# Patient Record
Sex: Female | Born: 1985
Health system: Southern US, Community
[De-identification: ages and names within clinical notes are randomized; demographics above are authoritative.]

## PROBLEM LIST (undated history)

## (undated) DIAGNOSIS — R51 Headache: Secondary | ICD-10-CM

## (undated) DIAGNOSIS — F329 Major depressive disorder, single episode, unspecified: Secondary | ICD-10-CM

## (undated) DIAGNOSIS — I259 Chronic ischemic heart disease, unspecified: Secondary | ICD-10-CM

## (undated) DIAGNOSIS — R519 Headache, unspecified: Secondary | ICD-10-CM

## (undated) DIAGNOSIS — S069XAA Unspecified intracranial injury with loss of consciousness status unknown, initial encounter: Secondary | ICD-10-CM

## (undated) DIAGNOSIS — N186 End stage renal disease: Secondary | ICD-10-CM

## (undated) DIAGNOSIS — E611 Iron deficiency: Secondary | ICD-10-CM

## (undated) DIAGNOSIS — F419 Anxiety disorder, unspecified: Secondary | ICD-10-CM

## (undated) DIAGNOSIS — F32A Depression, unspecified: Secondary | ICD-10-CM

## (undated) DIAGNOSIS — N2581 Secondary hyperparathyroidism of renal origin: Secondary | ICD-10-CM

## (undated) DIAGNOSIS — I10 Essential (primary) hypertension: Secondary | ICD-10-CM

## (undated) DIAGNOSIS — S069X9A Unspecified intracranial injury with loss of consciousness of unspecified duration, initial encounter: Secondary | ICD-10-CM

## (undated) HISTORY — DX: Secondary hyperparathyroidism of renal origin: N25.81

## (undated) HISTORY — PX: ARTERIOVENOUS GRAFT PLACEMENT: SUR1029

## (undated) HISTORY — DX: End stage renal disease: N18.6

## (undated) HISTORY — DX: Iron deficiency: E61.1

## (undated) HISTORY — PX: NO PAST SURGERIES: SHX2092

## (undated) HISTORY — DX: Unspecified intracranial injury with loss of consciousness of unspecified duration, initial encounter: S06.9X9A

## (undated) HISTORY — DX: Unspecified intracranial injury with loss of consciousness status unknown, initial encounter: S06.9XAA

## (undated) HISTORY — DX: Anxiety disorder, unspecified: F41.9

## (undated) HISTORY — PX: OTHER SURGICAL HISTORY: SHX169

## (undated) HISTORY — DX: Chronic ischemic heart disease, unspecified: I25.9

---

## 2006-11-14 ENCOUNTER — Inpatient Hospital Stay (HOSPITAL_COMMUNITY): Admission: EM | Admit: 2006-11-14 | Discharge: 2006-11-23 | Payer: Self-pay | Admitting: Emergency Medicine

## 2006-11-14 DIAGNOSIS — I509 Heart failure, unspecified: Secondary | ICD-10-CM

## 2006-11-14 HISTORY — DX: Heart failure, unspecified: I50.9

## 2006-11-16 ENCOUNTER — Ambulatory Visit: Payer: Self-pay | Admitting: Vascular Surgery

## 2006-11-19 ENCOUNTER — Encounter: Payer: Self-pay | Admitting: Vascular Surgery

## 2007-01-02 ENCOUNTER — Ambulatory Visit: Payer: Self-pay | Admitting: Vascular Surgery

## 2007-02-08 ENCOUNTER — Ambulatory Visit (HOSPITAL_COMMUNITY): Admission: RE | Admit: 2007-02-08 | Discharge: 2007-02-08 | Payer: Self-pay | Admitting: Vascular Surgery

## 2007-02-08 ENCOUNTER — Ambulatory Visit: Payer: Self-pay | Admitting: Vascular Surgery

## 2007-08-11 ENCOUNTER — Emergency Department (HOSPITAL_COMMUNITY): Admission: EM | Admit: 2007-08-11 | Discharge: 2007-08-12 | Payer: Self-pay | Admitting: Emergency Medicine

## 2007-08-13 ENCOUNTER — Inpatient Hospital Stay (HOSPITAL_COMMUNITY): Admission: EM | Admit: 2007-08-13 | Discharge: 2007-08-16 | Payer: Self-pay | Admitting: Emergency Medicine

## 2007-10-22 ENCOUNTER — Emergency Department (HOSPITAL_COMMUNITY): Admission: EM | Admit: 2007-10-22 | Discharge: 2007-10-22 | Payer: Self-pay | Admitting: Emergency Medicine

## 2008-03-19 ENCOUNTER — Encounter: Admission: RE | Admit: 2008-03-19 | Discharge: 2008-03-19 | Payer: Self-pay | Admitting: Nephrology

## 2009-07-28 ENCOUNTER — Inpatient Hospital Stay (HOSPITAL_COMMUNITY): Admission: AD | Admit: 2009-07-28 | Discharge: 2009-07-28 | Payer: Self-pay | Admitting: Obstetrics & Gynecology

## 2009-10-15 ENCOUNTER — Ambulatory Visit: Payer: Self-pay | Admitting: Vascular Surgery

## 2009-10-21 ENCOUNTER — Inpatient Hospital Stay (HOSPITAL_COMMUNITY): Admission: RE | Admit: 2009-10-21 | Discharge: 2009-10-23 | Payer: Self-pay | Admitting: Vascular Surgery

## 2009-10-21 ENCOUNTER — Ambulatory Visit: Payer: Self-pay | Admitting: Vascular Surgery

## 2009-10-28 ENCOUNTER — Ambulatory Visit: Payer: Self-pay | Admitting: Thoracic Diseases

## 2009-11-19 ENCOUNTER — Ambulatory Visit: Payer: Self-pay | Admitting: Vascular Surgery

## 2009-12-15 ENCOUNTER — Emergency Department (HOSPITAL_COMMUNITY): Admission: EM | Admit: 2009-12-15 | Discharge: 2009-12-15 | Payer: Self-pay | Admitting: Emergency Medicine

## 2009-12-24 ENCOUNTER — Ambulatory Visit: Payer: Self-pay | Admitting: Vascular Surgery

## 2010-01-04 ENCOUNTER — Ambulatory Visit: Payer: Self-pay | Admitting: Vascular Surgery

## 2010-01-04 ENCOUNTER — Ambulatory Visit (HOSPITAL_COMMUNITY): Admission: RE | Admit: 2010-01-04 | Discharge: 2010-01-04 | Payer: Self-pay | Admitting: Vascular Surgery

## 2010-01-13 ENCOUNTER — Ambulatory Visit: Payer: Self-pay | Admitting: Vascular Surgery

## 2010-01-16 ENCOUNTER — Ambulatory Visit: Payer: Self-pay | Admitting: Pulmonary Disease

## 2010-01-16 ENCOUNTER — Inpatient Hospital Stay (HOSPITAL_COMMUNITY): Admission: EM | Admit: 2010-01-16 | Discharge: 2010-01-17 | Payer: Self-pay | Admitting: Emergency Medicine

## 2010-01-20 ENCOUNTER — Ambulatory Visit (HOSPITAL_COMMUNITY): Admission: RE | Admit: 2010-01-20 | Discharge: 2010-01-20 | Payer: Self-pay | Admitting: Vascular Surgery

## 2010-01-20 ENCOUNTER — Ambulatory Visit: Payer: Self-pay | Admitting: Vascular Surgery

## 2010-02-04 ENCOUNTER — Encounter: Payer: Self-pay | Admitting: Emergency Medicine

## 2010-02-04 ENCOUNTER — Inpatient Hospital Stay (HOSPITAL_COMMUNITY): Admission: EM | Admit: 2010-02-04 | Discharge: 2010-02-11 | Payer: Self-pay | Admitting: Vascular Surgery

## 2010-02-21 ENCOUNTER — Ambulatory Visit: Payer: Self-pay | Admitting: Surgery

## 2010-03-07 ENCOUNTER — Ambulatory Visit: Payer: Self-pay | Admitting: Surgery

## 2010-03-28 ENCOUNTER — Ambulatory Visit: Payer: Self-pay | Admitting: Surgery

## 2010-04-15 ENCOUNTER — Ambulatory Visit: Payer: Self-pay | Admitting: Vascular Surgery

## 2010-04-25 ENCOUNTER — Ambulatory Visit: Payer: Self-pay | Admitting: Surgery

## 2010-05-27 ENCOUNTER — Inpatient Hospital Stay (HOSPITAL_COMMUNITY)
Admission: EM | Admit: 2010-05-27 | Discharge: 2010-06-02 | Payer: Self-pay | Source: Home / Self Care | Admitting: Emergency Medicine

## 2010-05-29 ENCOUNTER — Ambulatory Visit: Payer: Self-pay | Admitting: Vascular Surgery

## 2010-05-30 ENCOUNTER — Ambulatory Visit: Payer: Self-pay | Admitting: Infectious Diseases

## 2010-06-02 ENCOUNTER — Encounter (INDEPENDENT_AMBULATORY_CARE_PROVIDER_SITE_OTHER): Payer: Self-pay | Admitting: Cardiovascular Disease

## 2010-06-15 ENCOUNTER — Ambulatory Visit: Payer: Self-pay | Admitting: Vascular Surgery

## 2010-07-15 ENCOUNTER — Inpatient Hospital Stay (HOSPITAL_COMMUNITY): Admission: RE | Admit: 2010-07-15 | Discharge: 2010-07-17 | Payer: Self-pay | Admitting: Vascular Surgery

## 2010-07-27 ENCOUNTER — Ambulatory Visit: Payer: Self-pay | Admitting: Vascular Surgery

## 2010-08-17 ENCOUNTER — Emergency Department (HOSPITAL_COMMUNITY)
Admission: EM | Admit: 2010-08-17 | Discharge: 2010-08-17 | Payer: Medicaid Other | Source: Home / Self Care | Admitting: Emergency Medicine

## 2010-08-18 ENCOUNTER — Ambulatory Visit: Admission: RE | Admit: 2010-08-18 | Discharge: 2010-08-18 | Payer: Self-pay | Source: Home / Self Care

## 2010-08-23 ENCOUNTER — Ambulatory Visit (HOSPITAL_COMMUNITY)
Admission: RE | Admit: 2010-08-23 | Discharge: 2010-08-23 | Payer: Self-pay | Source: Home / Self Care | Attending: Vascular Surgery | Admitting: Vascular Surgery

## 2010-08-29 LAB — POCT I-STAT 4, (NA,K, GLUC, HGB,HCT)
Glucose, Bld: 86 mg/dL (ref 70–99)
HCT: 46 % (ref 36.0–46.0)
Hemoglobin: 15.6 g/dL — ABNORMAL HIGH (ref 12.0–15.0)
Potassium: 5.1 mEq/L (ref 3.5–5.1)
Sodium: 138 mEq/L (ref 135–145)

## 2010-08-29 LAB — SURGICAL PCR SCREEN
MRSA, PCR: NEGATIVE
Staphylococcus aureus: NEGATIVE

## 2010-08-29 LAB — HCG, SERUM, QUALITATIVE: Preg, Serum: NEGATIVE

## 2010-09-03 ENCOUNTER — Encounter: Payer: Self-pay | Admitting: Internal Medicine

## 2010-10-25 LAB — CULTURE, BLOOD (ROUTINE X 2)
Culture  Setup Time: 201112022351
Culture: NO GROWTH

## 2010-10-25 LAB — POCT I-STAT 4, (NA,K, GLUC, HGB,HCT)
HCT: 42 % (ref 36.0–46.0)
Sodium: 134 mEq/L — ABNORMAL LOW (ref 135–145)

## 2010-10-25 LAB — RENAL FUNCTION PANEL
Albumin: 3.2 g/dL — ABNORMAL LOW (ref 3.5–5.2)
Chloride: 98 mEq/L (ref 96–112)
Creatinine, Ser: 9.51 mg/dL — ABNORMAL HIGH (ref 0.4–1.2)
GFR calc non Af Amer: 5 mL/min — ABNORMAL LOW (ref >60)
Potassium: 5.1 mEq/L (ref 3.5–5.1)
Sodium: 133 mEq/L — ABNORMAL LOW (ref 135–145)

## 2010-10-25 LAB — BASIC METABOLIC PANEL
BUN: 37 mg/dL — ABNORMAL HIGH (ref 6–23)
BUN: 73 mg/dL — ABNORMAL HIGH (ref 6–23)
Calcium: 9 mg/dL (ref 8.4–10.5)
Chloride: 97 mEq/L (ref 96–112)
GFR calc non Af Amer: 5 mL/min — ABNORMAL LOW (ref >60)
GFR calc non Af Amer: 7 mL/min — ABNORMAL LOW (ref >60)
Potassium: 4.6 mEq/L (ref 3.5–5.1)
Sodium: 130 mEq/L — ABNORMAL LOW (ref 135–145)

## 2010-10-25 LAB — CBC
Hemoglobin: 12.2 g/dL (ref 12.0–15.0)
Hemoglobin: 13.3 g/dL (ref 12.0–15.0)
MCHC: 32.4 g/dL (ref 30.0–36.0)
Platelets: 141 10*3/uL — ABNORMAL LOW (ref 150–400)
Platelets: 142 10*3/uL — ABNORMAL LOW (ref 150–400)
RBC: 3.82 MIL/uL — ABNORMAL LOW (ref 3.87–5.11)
RBC: 4.28 MIL/uL (ref 3.87–5.11)
RDW: 14 % (ref 11.5–15.5)
WBC: 12.6 10*3/uL — ABNORMAL HIGH (ref 4.0–10.5)
WBC: 17.4 10*3/uL — ABNORMAL HIGH (ref 4.0–10.5)
WBC: 17.6 10*3/uL — ABNORMAL HIGH (ref 4.0–10.5)

## 2010-10-25 LAB — PROTIME-INR: Prothrombin Time: 15 seconds (ref 11.6–15.2)

## 2010-10-25 LAB — APTT: aPTT: 36 seconds (ref 24–37)

## 2010-10-25 LAB — MRSA PCR SCREENING

## 2010-10-26 LAB — ANAEROBIC CULTURE

## 2010-10-26 LAB — CBC
HCT: 35.6 % — ABNORMAL LOW (ref 36.0–46.0)
Hemoglobin: 11.4 g/dL — ABNORMAL LOW (ref 12.0–15.0)
MCHC: 32.5 g/dL (ref 30.0–36.0)
MCHC: 32.5 g/dL (ref 30.0–36.0)
MCV: 90.1 fL (ref 78.0–100.0)
Platelets: 144 10*3/uL — ABNORMAL LOW (ref 150–400)
Platelets: 199 10*3/uL (ref 150–400)
RDW: 13.9 % (ref 11.5–15.5)
RDW: 14.1 % (ref 11.5–15.5)
WBC: 13.8 10*3/uL — ABNORMAL HIGH (ref 4.0–10.5)
WBC: 9.4 10*3/uL (ref 4.0–10.5)

## 2010-10-26 LAB — PROTEIN C ACTIVITY: Protein C Activity: 76 % (ref 75–133)

## 2010-10-26 LAB — RENAL FUNCTION PANEL
Albumin: 2.4 g/dL — ABNORMAL LOW (ref 3.5–5.2)
BUN: 37 mg/dL — ABNORMAL HIGH (ref 6–23)
Calcium: 8.4 mg/dL (ref 8.4–10.5)
Calcium: 9 mg/dL (ref 8.4–10.5)
GFR calc Af Amer: 5 mL/min — ABNORMAL LOW (ref >60)
GFR calc non Af Amer: 4 mL/min — ABNORMAL LOW (ref >60)
Phosphorus: 7.7 mg/dL — ABNORMAL HIGH (ref 2.3–4.6)
Phosphorus: 8.7 mg/dL — ABNORMAL HIGH (ref 2.3–4.6)
Potassium: 4.8 mEq/L (ref 3.5–5.1)
Sodium: 130 mEq/L — ABNORMAL LOW (ref 135–145)
Sodium: 133 mEq/L — ABNORMAL LOW (ref 135–145)

## 2010-10-26 LAB — PROTIME-INR: INR: 1.08 (ref 0.00–1.49)

## 2010-10-26 LAB — CULTURE, BLOOD (ROUTINE X 2)
Culture  Setup Time: 201110172336
Culture: NO GROWTH

## 2010-10-26 LAB — LUPUS ANTICOAGULANT PANEL
DRVVT: 63.1 secs — ABNORMAL HIGH (ref 36.2–44.3)
Lupus Anticoagulant: DETECTED — AB
PTT Lupus Anticoagulant: 51 secs — ABNORMAL HIGH (ref 30.0–45.6)
PTTLA 4:1 Mix: 49.6 secs — ABNORMAL HIGH (ref 30.0–45.6)

## 2010-10-26 LAB — INFLUENZA PANEL BY PCR (TYPE A & B)
Influenza A By PCR: NEGATIVE
Influenza B By PCR: NEGATIVE

## 2010-10-26 LAB — HOMOCYSTEINE: Homocysteine: 25 umol/L — ABNORMAL HIGH (ref 4.0–15.4)

## 2010-10-26 LAB — APTT: aPTT: 39 seconds — ABNORMAL HIGH (ref 24–37)

## 2010-10-26 LAB — HIV ANTIBODY (ROUTINE TESTING W REFLEX): HIV: NONREACTIVE

## 2010-10-26 LAB — PROTHROMBIN GENE MUTATION

## 2010-10-26 LAB — BETA-2-GLYCOPROTEIN I ABS, IGG/M/A: Beta-2-Glycoprotein I IgA: 6 A Units (ref <20)

## 2010-10-26 LAB — TISSUE CULTURE

## 2010-10-27 LAB — BASIC METABOLIC PANEL WITH GFR
BUN: 15 mg/dL (ref 6–23)
CO2: 25 meq/L (ref 19–32)
Calcium: 8.9 mg/dL (ref 8.4–10.5)
Chloride: 99 meq/L (ref 96–112)
Creatinine, Ser: 4.38 mg/dL — ABNORMAL HIGH (ref 0.4–1.2)
GFR calc non Af Amer: 12 mL/min — ABNORMAL LOW
Glucose, Bld: 93 mg/dL (ref 70–99)
Potassium: 4.3 meq/L (ref 3.5–5.1)
Sodium: 137 meq/L (ref 135–145)

## 2010-10-27 LAB — URINALYSIS, ROUTINE W REFLEX MICROSCOPIC
Bilirubin Urine: NEGATIVE
Glucose, UA: 100 mg/dL — AB
Ketones, ur: NEGATIVE mg/dL
Leukocytes, UA: NEGATIVE
Nitrite: NEGATIVE
Protein, ur: >300 mg/dL — AB
Specific Gravity, Urine: 1.014 (ref 1.005–1.030)
Urobilinogen, UA: 0.2 mg/dL (ref 0.0–1.0)
pH: 8 (ref 5.0–8.0)

## 2010-10-27 LAB — DIFFERENTIAL
Basophils Absolute: 0.1 10*3/uL (ref 0.0–0.1)
Basophils Relative: 0 % (ref 0–1)
Eosinophils Absolute: 0 10*3/uL (ref 0.0–0.7)
Neutro Abs: 12 10*3/uL — ABNORMAL HIGH (ref 1.7–7.7)
Neutrophils Relative %: 89 % — ABNORMAL HIGH (ref 43–77)

## 2010-10-27 LAB — URINE MICROSCOPIC-ADD ON

## 2010-10-27 LAB — PROLACTIN: Prolactin: 28.8 ng/mL

## 2010-10-27 LAB — HEPATIC FUNCTION PANEL
ALT: 35 U/L (ref 0–35)
AST: 30 U/L (ref 0–37)
Albumin: 3.1 g/dL — ABNORMAL LOW (ref 3.5–5.2)
Bilirubin, Direct: <0.1 mg/dL (ref 0.0–0.3)
Total Bilirubin: 0.5 mg/dL (ref 0.3–1.2)

## 2010-10-27 LAB — CBC
HCT: 39.1 % (ref 36.0–46.0)
Hemoglobin: 13.2 g/dL (ref 12.0–15.0)
MCH: 29.9 pg (ref 26.0–34.0)
MCHC: 33.8 g/dL (ref 30.0–36.0)
MCV: 88.7 fL (ref 78.0–100.0)
Platelets: 155 10*3/uL (ref 150–400)
RBC: 4.41 MIL/uL (ref 3.87–5.11)
RDW: 14.1 % (ref 11.5–15.5)
WBC: 13.4 10*3/uL — ABNORMAL HIGH (ref 4.0–10.5)

## 2010-10-27 LAB — MRSA PCR SCREENING: MRSA by PCR: NEGATIVE

## 2010-10-27 LAB — CULTURE, BLOOD (ROUTINE X 2): Culture  Setup Time: 201110150113

## 2010-10-27 LAB — PROCALCITONIN: Procalcitonin: 17.09 ng/mL

## 2010-10-27 LAB — POCT PREGNANCY, URINE: Preg Test, Ur: NEGATIVE

## 2010-10-27 LAB — LACTIC ACID, PLASMA: Lactic Acid, Venous: 0.9 mmol/L (ref 0.5–2.2)

## 2010-10-30 LAB — RENAL FUNCTION PANEL
Albumin: 2.4 g/dL — ABNORMAL LOW (ref 3.5–5.2)
Albumin: 2.4 g/dL — ABNORMAL LOW (ref 3.5–5.2)
BUN: 29 mg/dL — ABNORMAL HIGH (ref 6–23)
Calcium: 9.1 mg/dL (ref 8.4–10.5)
Calcium: 11.2 mg/dL — ABNORMAL HIGH (ref 8.4–10.5)
Calcium: 8.9 mg/dL (ref 8.4–10.5)
Creatinine, Ser: 10.84 mg/dL — ABNORMAL HIGH (ref 0.4–1.2)
GFR calc Af Amer: 5 mL/min — ABNORMAL LOW (ref >60)
GFR calc Af Amer: 5 mL/min — ABNORMAL LOW (ref >60)
GFR calc non Af Amer: 4 mL/min — ABNORMAL LOW (ref >60)
GFR calc non Af Amer: 4 mL/min — ABNORMAL LOW (ref >60)
Glucose, Bld: 91 mg/dL (ref 70–99)
Glucose, Bld: 143 mg/dL — ABNORMAL HIGH (ref 70–99)
Phosphorus: 3.8 mg/dL (ref 2.3–4.6)
Phosphorus: 5 mg/dL — ABNORMAL HIGH (ref 2.3–4.6)
Potassium: 5.3 mEq/L — ABNORMAL HIGH (ref 3.5–5.1)
Sodium: 136 mEq/L (ref 135–145)
Sodium: 134 mEq/L — ABNORMAL LOW (ref 135–145)

## 2010-10-30 LAB — URINALYSIS, ROUTINE W REFLEX MICROSCOPIC
Bilirubin Urine: NEGATIVE
Glucose, UA: 250 mg/dL — AB
Ketones, ur: NEGATIVE mg/dL
Specific Gravity, Urine: 1.011 (ref 1.005–1.030)
pH: 8.5 — ABNORMAL HIGH (ref 5.0–8.0)

## 2010-10-30 LAB — COMPREHENSIVE METABOLIC PANEL
ALT: 20 U/L (ref 0–35)
Alkaline Phosphatase: 74 U/L (ref 39–117)
BUN: 20 mg/dL (ref 6–23)
CO2: 26 mEq/L (ref 19–32)
GFR calc non Af Amer: 9 mL/min — ABNORMAL LOW (ref >60)
Glucose, Bld: 94 mg/dL (ref 70–99)
Potassium: 3.4 mEq/L — ABNORMAL LOW (ref 3.5–5.1)
Sodium: 135 mEq/L (ref 135–145)
Total Bilirubin: 0.2 mg/dL — ABNORMAL LOW (ref 0.3–1.2)

## 2010-10-30 LAB — IRON AND TIBC
Iron: 59 ug/dL (ref 42–135)
TIBC: 179 ug/dL — ABNORMAL LOW (ref 250–470)

## 2010-10-30 LAB — BASIC METABOLIC PANEL
BUN: 47 mg/dL — ABNORMAL HIGH (ref 6–23)
CO2: 24 mEq/L (ref 19–32)
Calcium: 9.3 mg/dL (ref 8.4–10.5)
Creatinine, Ser: 9.59 mg/dL — ABNORMAL HIGH (ref 0.4–1.2)
GFR calc Af Amer: 6 mL/min — ABNORMAL LOW (ref >60)

## 2010-10-30 LAB — URINE MICROSCOPIC-ADD ON

## 2010-10-30 LAB — URINE CULTURE

## 2010-10-30 LAB — CBC
HCT: 29.4 % — ABNORMAL LOW (ref 36.0–46.0)
HCT: 30.4 % — ABNORMAL LOW (ref 36.0–46.0)
HCT: 32.3 % — ABNORMAL LOW (ref 36.0–46.0)
HCT: 35.4 % — ABNORMAL LOW (ref 36.0–46.0)
Hemoglobin: 11.6 g/dL — ABNORMAL LOW (ref 12.0–15.0)
Hemoglobin: 9.6 g/dL — ABNORMAL LOW (ref 12.0–15.0)
MCH: 30.2 pg (ref 26.0–34.0)
MCH: 30.2 pg (ref 26.0–34.0)
MCH: 30.4 pg (ref 26.0–34.0)
MCHC: 33.7 g/dL (ref 30.0–36.0)
MCHC: 33.1 g/dL (ref 30.0–36.0)
MCHC: 33.3 g/dL (ref 30.0–36.0)
MCHC: 33.8 g/dL (ref 30.0–36.0)
MCV: 89.6 fL (ref 78.0–100.0)
MCV: 91 fL (ref 78.0–100.0)
Platelets: 109 10*3/uL — ABNORMAL LOW (ref 150–400)
Platelets: 110 10*3/uL — ABNORMAL LOW (ref 150–400)
Platelets: 139 10*3/uL — ABNORMAL LOW (ref 150–400)
Platelets: 163 10*3/uL (ref 150–400)
RBC: 3.27 MIL/uL — ABNORMAL LOW (ref 3.87–5.11)
RDW: 14.7 % (ref 11.5–15.5)
RDW: 14.9 % (ref 11.5–15.5)
RDW: 15.2 % (ref 11.5–15.5)
RDW: 15.4 % (ref 11.5–15.5)
RDW: 15.7 % — ABNORMAL HIGH (ref 11.5–15.5)
WBC: 13.1 10*3/uL — ABNORMAL HIGH (ref 4.0–10.5)
WBC: 22 10*3/uL — ABNORMAL HIGH (ref 4.0–10.5)

## 2010-10-30 LAB — DIFFERENTIAL
Basophils Absolute: 0 10*3/uL (ref 0.0–0.1)
Basophils Relative: 0 % (ref 0–1)
Eosinophils Absolute: 0 10*3/uL (ref 0.0–0.7)
Neutro Abs: 19.5 10*3/uL — ABNORMAL HIGH (ref 1.7–7.7)
Neutrophils Relative %: 89 % — ABNORMAL HIGH (ref 43–77)

## 2010-10-30 LAB — CULTURE, BLOOD (ROUTINE X 2)

## 2010-10-30 LAB — POCT I-STAT, CHEM 8
BUN: 21 mg/dL (ref 6–23)
Calcium, Ion: 0.86 mmol/L — ABNORMAL LOW (ref 1.12–1.32)
Chloride: 101 mEq/L (ref 96–112)
Glucose, Bld: 88 mg/dL (ref 70–99)
HCT: 39 % (ref 36.0–46.0)
Potassium: 3.8 mEq/L (ref 3.5–5.1)

## 2010-10-30 LAB — PROCALCITONIN: Procalcitonin: 6.95 ng/mL

## 2010-10-30 LAB — POCT PREGNANCY, URINE: Preg Test, Ur: NEGATIVE

## 2010-10-30 LAB — LACTIC ACID, PLASMA: Lactic Acid, Venous: 1 mmol/L (ref 0.5–2.2)

## 2010-10-31 LAB — TRICYCLICS SCREEN, URINE: TCA Scrn: NOT DETECTED

## 2010-10-31 LAB — BASIC METABOLIC PANEL
BUN: 61 mg/dL — ABNORMAL HIGH (ref 6–23)
CO2: 20 mEq/L (ref 19–32)
Chloride: 101 mEq/L (ref 96–112)
GFR calc Af Amer: 8 mL/min — ABNORMAL LOW (ref >60)
GFR calc non Af Amer: 6 mL/min — ABNORMAL LOW (ref >60)
Glucose, Bld: 108 mg/dL — ABNORMAL HIGH (ref 70–99)
Glucose, Bld: 87 mg/dL (ref 70–99)
Potassium: 3.6 mEq/L (ref 3.5–5.1)
Potassium: 4.7 mEq/L (ref 3.5–5.1)
Sodium: 133 mEq/L — ABNORMAL LOW (ref 135–145)
Sodium: 137 mEq/L (ref 135–145)

## 2010-10-31 LAB — CBC
HCT: 36.1 % (ref 36.0–46.0)
HCT: 36.5 % (ref 36.0–46.0)
Hemoglobin: 11.9 g/dL — ABNORMAL LOW (ref 12.0–15.0)
Hemoglobin: 12.2 g/dL (ref 12.0–15.0)
Hemoglobin: 12.4 g/dL (ref 12.0–15.0)
MCHC: 34 g/dL (ref 30.0–36.0)
MCV: 92.6 fL (ref 78.0–100.0)
Platelets: 180 10*3/uL (ref 150–400)
RBC: 3.87 MIL/uL (ref 3.87–5.11)
RBC: 3.93 MIL/uL (ref 3.87–5.11)
RDW: 15.8 % — ABNORMAL HIGH (ref 11.5–15.5)
RDW: 16 % — ABNORMAL HIGH (ref 11.5–15.5)

## 2010-10-31 LAB — POCT I-STAT 4, (NA,K, GLUC, HGB,HCT)
Glucose, Bld: 88 mg/dL (ref 70–99)
Glucose, Bld: 91 mg/dL (ref 70–99)
HCT: 44 % (ref 36.0–46.0)
Hemoglobin: 16 g/dL — ABNORMAL HIGH (ref 12.0–15.0)
Potassium: 4.4 mEq/L (ref 3.5–5.1)
Sodium: 140 mEq/L (ref 135–145)

## 2010-10-31 LAB — DIFFERENTIAL
Basophils Absolute: 0.1 10*3/uL (ref 0.0–0.1)
Eosinophils Relative: 1 % (ref 0–5)
Lymphocytes Relative: 10 % — ABNORMAL LOW (ref 12–46)
Lymphocytes Relative: 16 % (ref 12–46)
Lymphs Abs: 1.3 10*3/uL (ref 0.7–4.0)
Lymphs Abs: 2.3 10*3/uL (ref 0.7–4.0)
Monocytes Absolute: 0.7 10*3/uL (ref 0.1–1.0)
Monocytes Absolute: 0.9 10*3/uL (ref 0.1–1.0)
Monocytes Relative: 5 % (ref 3–12)
Monocytes Relative: 7 % (ref 3–12)
Neutro Abs: 10 10*3/uL — ABNORMAL HIGH (ref 1.7–7.7)

## 2010-10-31 LAB — POCT I-STAT 3, VENOUS BLOOD GAS (G3P V)
O2 Saturation: 99 %
TCO2: 21 mmol/L (ref 0–100)
pCO2, Ven: 32.9 mmHg — ABNORMAL LOW (ref 45.0–50.0)
pO2, Ven: 157 mmHg — ABNORMAL HIGH (ref 30.0–45.0)

## 2010-10-31 LAB — POCT I-STAT 3, ART BLOOD GAS (G3+)
Acid-base deficit: 3 mmol/L — ABNORMAL HIGH (ref 0.0–2.0)
Bicarbonate: 23 mEq/L (ref 20.0–24.0)
TCO2: 24 mmol/L (ref 0–100)
pH, Arterial: 7.318 — ABNORMAL LOW (ref 7.350–7.400)

## 2010-10-31 LAB — HEPATIC FUNCTION PANEL
ALT: 19 U/L (ref 0–35)
AST: 22 U/L (ref 0–37)
Alkaline Phosphatase: 86 U/L (ref 39–117)
Bilirubin, Direct: <0.1 mg/dL (ref 0.0–0.3)
Total Bilirubin: 0.8 mg/dL (ref 0.3–1.2)

## 2010-10-31 LAB — RAPID URINE DRUG SCREEN, HOSP PERFORMED
Barbiturates: NOT DETECTED
Cocaine: NOT DETECTED

## 2010-10-31 LAB — POCT I-STAT, CHEM 8
Chloride: 108 mEq/L (ref 96–112)
Creatinine, Ser: 7.9 mg/dL — ABNORMAL HIGH (ref 0.4–1.2)
Glucose, Bld: 87 mg/dL (ref 70–99)
Potassium: 7.2 mEq/L (ref 3.5–5.1)

## 2010-10-31 LAB — KETONES, QUALITATIVE: Acetone, Bld: NEGATIVE

## 2010-10-31 LAB — ETHANOL: Alcohol, Ethyl (B): 30 mg/dL — ABNORMAL HIGH (ref 0–10)

## 2010-11-01 LAB — COMPREHENSIVE METABOLIC PANEL
ALT: 49 U/L — ABNORMAL HIGH (ref 0–35)
Alkaline Phosphatase: 115 U/L (ref 39–117)
CO2: 34 mEq/L — ABNORMAL HIGH (ref 19–32)
Calcium: 9.8 mg/dL (ref 8.4–10.5)
GFR calc non Af Amer: 12 mL/min — ABNORMAL LOW (ref >60)
Glucose, Bld: 101 mg/dL — ABNORMAL HIGH (ref 70–99)
Sodium: 140 mEq/L (ref 135–145)

## 2010-11-01 LAB — DIFFERENTIAL
Basophils Absolute: 0 10*3/uL (ref 0.0–0.1)
Basophils Relative: 1 % (ref 0–1)
Eosinophils Absolute: 0.3 10*3/uL (ref 0.0–0.7)
Lymphs Abs: 1.3 10*3/uL (ref 0.7–4.0)
Neutrophils Relative %: 74 % (ref 43–77)

## 2010-11-01 LAB — URINALYSIS, ROUTINE W REFLEX MICROSCOPIC
Bilirubin Urine: NEGATIVE
Glucose, UA: 100 mg/dL — AB
Ketones, ur: NEGATIVE mg/dL
Leukocytes, UA: NEGATIVE
Protein, ur: 100 mg/dL — AB
pH: 8.5 — ABNORMAL HIGH (ref 5.0–8.0)

## 2010-11-01 LAB — CBC
Hemoglobin: 12.9 g/dL (ref 12.0–15.0)
MCHC: 33.6 g/dL (ref 30.0–36.0)
RBC: 4.11 MIL/uL (ref 3.87–5.11)

## 2010-11-01 LAB — POCT PREGNANCY, URINE: Preg Test, Ur: NEGATIVE

## 2010-11-01 LAB — URINE MICROSCOPIC-ADD ON

## 2010-11-01 LAB — LIPASE, BLOOD: Lipase: 19 U/L (ref 11–59)

## 2010-11-06 LAB — CBC
HCT: 35 % — ABNORMAL LOW (ref 36.0–46.0)
Hemoglobin: 11.8 g/dL — ABNORMAL LOW (ref 12.0–15.0)
MCV: 95.2 fL (ref 78.0–100.0)
RBC: 3.68 MIL/uL — ABNORMAL LOW (ref 3.87–5.11)
WBC: 13.9 10*3/uL — ABNORMAL HIGH (ref 4.0–10.5)

## 2010-11-06 LAB — COMPREHENSIVE METABOLIC PANEL
AST: 7 U/L (ref 0–37)
Alkaline Phosphatase: 59 U/L (ref 39–117)
BUN: 60 mg/dL — ABNORMAL HIGH (ref 6–23)
CO2: 22 mEq/L (ref 19–32)
Chloride: 95 mEq/L — ABNORMAL LOW (ref 96–112)
Creatinine, Ser: 10 mg/dL — ABNORMAL HIGH (ref 0.4–1.2)
GFR calc non Af Amer: 5 mL/min — ABNORMAL LOW (ref >60)
Total Bilirubin: 0.4 mg/dL (ref 0.3–1.2)

## 2010-11-15 LAB — CBC
HCT: 40.7 % (ref 36.0–46.0)
Hemoglobin: 13.5 g/dL (ref 12.0–15.0)
MCV: 93.4 fL (ref 78.0–100.0)
Platelets: 253 10*3/uL (ref 150–400)
WBC: 12.8 10*3/uL — ABNORMAL HIGH (ref 4.0–10.5)

## 2010-11-15 LAB — WET PREP, GENITAL: Trich, Wet Prep: NONE SEEN

## 2010-11-15 LAB — DIFFERENTIAL
Eosinophils Absolute: 0.4 10*3/uL (ref 0.0–0.7)
Eosinophils Relative: 3 % (ref 0–5)
Lymphocytes Relative: 15 % (ref 12–46)
Lymphs Abs: 1.9 10*3/uL (ref 0.7–4.0)
Monocytes Absolute: 0.9 10*3/uL (ref 0.1–1.0)
Monocytes Relative: 7 % (ref 3–12)

## 2010-11-15 LAB — URINALYSIS, ROUTINE W REFLEX MICROSCOPIC
Ketones, ur: NEGATIVE mg/dL
Protein, ur: 100 mg/dL — AB
Urobilinogen, UA: 0.2 mg/dL (ref 0.0–1.0)

## 2010-11-15 LAB — GC/CHLAMYDIA PROBE AMP, GENITAL: GC Probe Amp, Genital: NEGATIVE

## 2010-11-15 LAB — URINE CULTURE

## 2010-11-15 LAB — URINE MICROSCOPIC-ADD ON
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
## 2010-12-27 NOTE — Assessment & Plan Note (Signed)
OFFICE VISIT   DALISHA, ROSENBAUM  DOB:  September 21, 1985                                       11/19/2009  [id number]   CHIEF COMPLAINT:  Followup right thigh loop graft.   HISTORY OF PRESENT ILLNESS:  The patient is a 25 year old woman who has  been dialyzing through a right upper arm AV fistula which has an  extremely large pseudoaneurysm.  She had a right thigh AV Gore-Tex graft  loop placed on 10/21/2009.  She is doing well.  She has had no pain in  the leg and no signs of steal.   PHYSICAL EXAM:  General:  This is a thin female in no acute distress who  is answering questions through an interpreter.  Right upper extremity:  Has a large multilobular pseudoaneurysm in the AV fistula.  She has no  signs of steal.  The hand is warm and pink and she had palpable distal  pulses.  Right lower extremity:  Wounds are healing well in the Gore-Tex  loop graft.  She had a good thrill and bruit and she has palpable DP  pulse in the right lower extremity.  Vital signs:  Heart rate was 70,  sats were 100% and respiratory rate was 10.   ASSESSMENT:  Well-healed right thigh femoral AV Gore-Tex graft 1 month  from surgery.   PLAN:  Is to use the right thigh AV graft times 1 month beginning next  week and we will see her back in a month with Dr. Donnetta Hutching to assess  removal of the pseudoaneurysms in the right upper extremity AV fistula.   Wray Kearns, PA-C   Rosetta Posner, M.D.  Electronically Signed   RR/MEDQ  D:  11/19/2009  T:  11/20/2009  Job:  [id number]

## 2010-12-27 NOTE — Procedures (Signed)
VASCULAR LAB EXAM   INDICATION:  Duplex of right thigh graft.   HISTORY:   EXAM:  Right thigh graft duplex.   IMPRESSION:  There appears to be a nonvascularized cystic structure  located around the graft with no evidence of fluid.   ___________________________________________  V. Leia Alf, MD   CB/MEDQ  D:  03/28/2010  T:  03/28/2010  Job:  [id number]

## 2010-12-27 NOTE — Assessment & Plan Note (Signed)
OFFICE VISIT   Derenda Fennel, Nathifa  DOB:  Apr 17, 1986                                       10/28/2009  [id number]   DATE OF SURGERY:  10/21/2009.  She is status post a right thigh AV Gore-  Tex graft.   CHIEF COMPLAINT:  Swelling in the area of surgery after fall.   HISTORY OF PRESENT ILLNESS:  The patient is a 25 year old Hispanic  speaking female who had a right AV graft placed on 10/21/2009.  She last  week was walking to the bathroom at home and her leg gave out and she  fell.  She had noted at the dialysis center a little bleeding at the  groin site and there was a question whether there were some more  swelling.  The patient states through an interpreter that her leg is  feeling well.  It is less swollen.  It is less painful.  She has had no  fever or chills or any other issues and is otherwise doing well.  She  has no pain in the leg.  No signs of steal.   PHYSICAL EXAM:  This is a well-developed, well-nourished young woman in  no acute distress.  Her heart rate was 70, sats were 98.  Respiratory  rate was 12.  She had decreased swelling in the right lower extremity.  All her wounds were healing well.  She was nontender over the graft  area.  She had positive DP and PT pulses palpable.  It was noted that  the swelling in the thigh was actually less than it had been in the  hospital.  There is one area of the groin wound where she had had some  dry old blood.  Otherwise the wound was well-healed.  There was no  fluctuance and no hematoma.  Steri-Strips were removed and Betadine was  applied and dry dressing to the groin which she can take off tomorrow  and shower.  She will follow up again in April as a final visit to look  at the wound.   ASSESSMENT AND PLAN:  Assessment is healing right femoral AV Gore-Tex  graft approximately 7 days from surgery, healing well with decreased  swelling in the thigh.  No drainage or signs of  infection.  Plan is to  follow up in 3 weeks for final check of the wound.   Wray Kearns, PA-C   [place]. Scot Dock, M.D.  Electronically Signed   RR/MEDQ  D:  10/28/2009  T:  10/28/2009  Job:  [id number]

## 2010-12-27 NOTE — H&P (Signed)
HISTORY AND PHYSICAL EXAMINATION   January 13, 2010   Re:  Katie Lyons, ROESE                  DOB:  Jun 19, 1986   CHIEF COMPLAINT:  Pseudoaneurysm, right thigh graft.   Patient is a 25 year old woman with end-stage renal disease who had  placement of a right femoral loop AV Gore-Tex graft on 10/21/2009.  She  also had ligation and removal of right upper arm arteriovenous  aneurysmal fistula which has been painful on 01/04/2010.   HISTORY OF PRESENT ILLNESS:  The patient returns to the office today  with increasing pseudoaneurysm in the lateral aspect of the loop graft  in the right femoral.  They have been accessing this graft at dialysis.  A few weeks ago she had an area that was infiltrated, and this has  progressively gotten larger over the last several weeks.  It is now more  painful.  She last week had a temperature of 100.  She is now afebrile.  She has had no redness in the area.  It is slightly tender over the  pseudoaneurysm.  She was seen in the office today on 01/13/2010 and  found to have an increasing mass, which was tender to palpation.  There  was no erythema in the area of the lateral femoral AV graft.  It was  felt that the patient would benefit from a revision of the graft and  exclusion of the pseudoaneurysm.   PAST MEDICAL HISTORY:  1. Gestational hypertension.  2. End-stage renal disease on hemodialysis at [facility].      She was begun on IV antibiotics.  3. Anemia.  4. Secondary hyperparathyroidism.  5. CHF with ejection fraction of 35%.   SOCIAL HISTORY:  The patient is married, has a child.   REVIEW OF SYSTEMS:  She denies any weight loss or weight gain.  She had  a slight fever last week, which is resolved.  VASCULAR:  She denies any signs of peripheral vascular disease, strokes,  mini strokes.  CARDIAC:  She denies any chest pain, chest tightness, shortness of  breath, or heart murmur.  GI:  She denies any black  tarry stools, blood in her stools, or ulcer  disease.  NEUROLOGICAL:  She denies any dizziness, blackouts, headaches.  PULMONARY:  She denies any bronchitis or chronic cough.  HEMATOLOGIC:  She that has she is anemic.  URINARY:  She has end-stage renal disease.  MUSCULOSKELETAL:  As above.   ALLERGIES:  She has no known drug allergies.   Her medications are unchanged from previous visits.   PHYSICAL EXAMINATION:  This is a thin, well-developed, well-nourished  woman in no acute distress.  Her blood pressure is 150/93.  Heart rate  was 66.  Her temperature was 97.4.  Her lungs were clear.  Heart:  Rate  and rhythm was regular.  Right thigh:  She had a 2 x 2 cm pulsatile mass  in the lateral aspect of the femoral thigh AV Gore-Tex graft.  This was  tender.  There is no erythema in this or other parts of the graft.  Otherwise, the wounds were well healed.  She had good perfusion in the  right leg, which distally was warm and pink.   ASSESSMENT:  Pseudoaneurysm secondary to a traumatic stick in the  lateral portion of the femoral arteriovenous loop graft.   PLAN:  To revise the graft and exclude the pseudoaneurysm.  This will be  done  by Dr. Donnetta Hutching on the patient's nondialysis day.   Katie Kearns, PA-C   [place]. Scot Dock, M.D.  Electronically Signed   RR/MEDQ  D:  01/13/2010  T:  01/13/2010  Job:  [id number]

## 2010-12-27 NOTE — Assessment & Plan Note (Signed)
OFFICE VISIT   Katie Lyons, Katie Lyons  DOB:  [birth year]                                       12/24/2009  [id number]   The patient presents today for continued follow-up of her right upper  arm AV fistula.  She has a huge dilatation and aneurysmal change of her  right upper arm AV fistula.  She does have a patent right femoral loop  graft that was placed on October 21, 2009.  She had been using this for  approximately 1 month with no difficulty.  She has continued to have  severe pain and tenderness over her AV fistula.  This is in excess of 6  cm in diameter extending from her antecubital space up to her axilla.  She had the femoral loop placed in preparation of removal of the fistula  and is now prepared for this.  I discussed the procedure including  outpatient nature the procedure and ligation of the fistula and removal  of as much as possible of her markedly dilated venous aneurysm.  We will  proceed with this as an outpatient on 05/24.     Rosetta Posner, M.D.  Electronically Signed   TFE/MEDQ  D:  12/24/2009  T:  12/27/2009  Job:  [id number]   cc:   Windy Kalata, M.D.

## 2010-12-27 NOTE — Assessment & Plan Note (Signed)
OFFICE VISIT   EBONIE, GRISSETT  DOB:  [birth year]                                       04/25/2010  [id number]   Ms, Holick comes back in today for follow-up of her thigh  graft.  Her pain has decreased.  There is been concern as to whether not  she had been receiving IV antibiotics.  I spoke with Dr. Mercy Moore about  this and he remembers writing an order for it.  He was going to check  with the dialysis center and make sure she had been getting antibiotics.  The bottom line is that her graft appears to be less tender.  There is  no erythema.  This appears to be healing.  At this point, I do not see  any need for surgical revision.  I am not scheduling her to come back to  see me at this time but she knows to contact me if she has any problems.     Eldridge Abrahams, MD  Electronically Signed   VWB/MEDQ  D:  04/25/2010  T:  04/26/2010  Job:  [id number]

## 2010-12-27 NOTE — H&P (Signed)
HISTORY AND PHYSICAL EXAMINATION   October 15, 2009   Re:  [name]                   DOB:  17-Nov-1985   CHIEF COMPLAINT:  Swelling and pain in my right arm x6 months.   HISTORY OF PRESENT ILLNESS:  Patient is a 25 year old Hispanic Spanish-  speaking-only female.  She has end-stage renal disease and began  dialysis in 2008.  She had a right upper arm brachiocephalic AV fistula  placed on 11/20/2006 by Dr. Deitra Mayo.  She is here today with  an interpreter.  Up until approximately 6 months ago, she had no real  issues with her right upper arm AV fistula.  Since then, she has been  having enlargement of the actual fistula size and with that increasing  pain that extends from her antecubital region to her shoulder.  She also  reports on dialysis she has had some chest pain as well.  She reports  the pain as 10/10 at times.  She does take Tylenol on occasion, which  seems to only improve the pain to 8/10.  She has some intermittent  numbness and tingling in her right hand and has become severe, that on  occasion she has been able unable to grip a pan while cooking.  She has  had a few bleeding episodes at home at the needle site used to access  her fistula but nothing that was severe.  With each episode, she has  only had to hold pressure for about 5 minutes or so and cover the site  with a Band-Aid.  She reports that the pain is becoming so severe that  she feels like she cannot tolerate it for many more months.  She does  not want a left upper extremity access.  She says she has had what  sounds like a vein mapping done in the past and was told her veins were  too small in the left arm.  She desires a leg graft.  She is quite  embarrassed about the appearance of her right arm AV fistula and does  not want to go through a similar experience in her left arm.   PAST MEDICAL HISTORY:  Significant for end-stage renal disease and  hypertension.   She is unsure of what caused her renal failure but  believes her hypertension may have played a role.  She denies any other  medical issues or surgeries but from her initial consult note, there is  some mention of cardiomyopathy with ejection fraction of 35% with also a  history of pericardial effusion and poorly controlled hypertension.   She has no known drug allergies.   MEDICATIONS:  She does not know the names of her medications but says  she is on 3 or 4 medications for her blood pressure.  She denies being  on any blood thinners   FAMILY HISTORY:  She denies family history for coronary artery disease,  diabetes mellitus, or cancer.   SOCIAL HISTORY:  She is single with a 37-year-old girl.  She does not  smoke or drink alcohol.   REVIEW OF SYSTEMS:  GENERAL:  She reports weight loss.  She denies  fever.  CARDIAC:  As stated above, she gets occasional chest pain with  hemodialysis.  Staff have been made aware.  She has none at present.  PULMONARY:  She denies shortness of breath.  GI: She denies any hematochezia.  GU: She does report  that she still produces some urine and denies  dysuria.  VASCULAR:  She denies any claudication symptoms.  NEUROLOGIC:  She denies any history of seizures or syncope.  She is  right-handed.  MUSCULOSKELETAL:  She has occasional mild back pain but no significant  musculoskeletal pains.  PSYCHIATRIC:  She denies any history of depression or anxiety.  ENT:  She denies any visual changes.  HEMATOLOGIC:  She denies any history of pulmonary embolism or DVT or  clotting disorders.   PHYSICAL EXAMINATION:  Heart rate is 101, blood pressure 139/96,  respirations 20.  GENERAL APPEARANCE:  A pleasant 25 year old Hispanic female, again who  is Spanish-speaking-only.  She appears her stated age.  HEENT: Head is normocephalic, atraumatic.  Oral mucosa is pink and  moist.  Sclerae are nonicteric.  LUNGS:  Lung sounds are clear throughout.  No wheezes,  rhonchi or rales.  CARDIOVASCULAR:  She has a 3/6 holosystolic murmur auscultated on both  sternal borders.  This does radiate to her carotid arteries, more  pronounced on the right side.  There is no peripheral edema.  She has 2+  radial pulses bilaterally.  Femoral pulses and dorsalis pedis pulses  bilaterally.  ABDOMEN:  Her abdomen is soft, nontender.  Normoactive bowel sounds.  No  masses appreciated.  MUSCULOSKELETAL:  No gross joint deformities were noted.  NEUROLOGIC:  Nonfocal.  Extremity movements were strong and symmetrical  x4.  SKIN:  No ulceration or rashes.  EXTREMITIES:  Her right upper arm shows evidence of an arteriovenous  fistula.  The fistula is aneurysmal from the antecubital site up to the  shoulder.  It is tortuous in nature.  The largest aneurysmal segment  measures approximately 4.5 cm and is near the antecubital region.  There  are no ulcerations noted and no active bleeding or erythema.  Her  bilateral grips are strong, and the fistula has an excellent thrill.   ASSESSMENT AND PLAN:  Patient is a 25 year old Hispanic female with a  functioning right upper arm arteriovenous fistula which has become  increasingly aneurysmal over the past 6 months and is now causing  symptoms of steal.  She describes the pain as severe in nature.  As  mentioned at this time, she is refusing access on the left arm and says  she has had been told that her left arm veins were adequate for access,  although this cannot be confirmed at this time.  Dr. Sherren Mocha Early did see  and examine the patient during this visit.  Because of the severity of  her symptoms, we do feel she would benefit from a new permanent access.  She would like to avoid a catheter if possible.  Subsequently, we will  schedule her for a right thigh arteriovenous Gore-Tex graft within the  next 1-2 weeks.  This will be done by Dr. Curt Jews.  As long as she is  able to tolerate her arm symptoms and has no acute  bleeding episodes, we  hope that they can continue to use her right arm fistula at hemodialysis  until they are able to successfully use her thigh graft.  If the thigh  graft is successful, then we would schedule her for ligation of her  right upper arm graft with recent resection of the pseudoaneurysm.  If  her pain becomes tolerable, then we will have to consider doing this  sooner and placing a hemodialysis catheter.  Dr. Donnetta Hutching has discussed the  risks and benefits of surgery, and  she agrees to proceed.  This time we  are anticipating surgery for Thursday, 10/21/2009.   In regards to her cardiac murmur, I would defer further workup to her  nephrologist.  She does not report any primary care physician, and she  is cannot tell me who she specifically sees as her nephrologist.  Per  our consult note in 2008, she has seen cardiologist, Dr. Rollene Fare, in  the past.   Jacinta Shoe, PA   Rosetta Posner, M.D.  Electronically Signed   AWZ/MEDQ  D:  10/15/2009  T:  10/15/2009  Job:  [id number]   cc:   [facility]

## 2010-12-27 NOTE — Assessment & Plan Note (Signed)
OFFICE VISIT   Katie, Lyons  DOB:  1985-10-05                                       06/15/2010  [id number]   I saw this patient in he office today for follow-up after recent  revision of her right thigh AV graft and evacuation of an infected  lymphocele.  This was done on 05/28/2010.  She comes in for a routine  wound check.  She states that her graft has been working well and she  has had no problems with it.  Her history is obtained through her  translator.  She has had no fever or chills.   She had a Streptococcus viridans and Streptococcus gordonii bacteremia  and was on ceftriaxone and gentamicin as per ID.  This was to continue  for  I believe for 4 weeks.   PHYSICAL EXAMINATION:  Blood pressure 157/92, heart rate is 76,  temperature is 98.  Her incisions are all healed nicely.  The graft has  an excellent thrill and bruit.  One of the counter incisions is  slightly open but appears to be closing in nicely with no drainage or  erythema.   I have instructed her to simply keep bacitracin on this with a Band-Aid.  I will see her back p.r.n.     Judeth Cornfield. Scot Dock, M.D.  Electronically Signed   CSD/MEDQ  D:  06/15/2010  T:  06/16/2010  Job:  [id number]   cc:   [facility]

## 2010-12-27 NOTE — Assessment & Plan Note (Signed)
OFFICE VISIT   Katie, Lyons  DOB:  1985/10/24                                       02/21/2010  [id number]   The patient is a 25 year old Hispanic female dialyzing through a right  thigh graft was recently revised by Dr. Donnetta Hutching secondary to  pseudoaneurysm.  She presented to the emergency department with fevers  and elevated white count as well as redness around her graft.  Since she  is so young and down to her legs for access, we would like to try to  treat this with antibiotics instead of removing the graft.  The patient  actually had a pretty good response to antibiotic therapy and was  discharged home without operation.  She is back today for followup.   PHYSICAL EXAMINATION:  Vital signs:  She is afebrile, hemodynamically  stable.  She is well-appearing, no distress.  Cardiovascular:  Regular  rate and rhythm.  Respirations nonlabored.  The right thigh graft is  patent with a palpable thrill.  The erythema has resolved.   ASSESSMENT/PLAN:  I am optimistic that we may be able to avoid removing  her graft.  I would recommend treating this with another 2 weeks of  antibiotics.  I would like to see her back in 2 weeks.  At that time I  will get an ultrasound to see if there is any residual fluid around her  graft.     Eldridge Abrahams, MD  Electronically Signed   VWB/MEDQ  D:  02/21/2010  T:  02/22/2010  Job:  [id number]   cc:   Ila Mcgill

## 2010-12-27 NOTE — Assessment & Plan Note (Signed)
OFFICE VISIT   Katie Lyons, Katie Lyons  DOB:  09/16/1985                                       03/07/2010  [id number]   REASON FOR VISIT:  Followup of infected graft.   HISTORY:  This is a 25 year old Hispanic speaking female, who recently  underwent revision of a right thigh graft secondary to a pseudoaneurysm.  She presented to the Emergency Department with fevers, elevated white  count as well as redness around the graft.  We have accepted to try to  treat this with antibiotics due to her young age and already being down  to a thigh graft for dialysis.  She has had a  good response to  antibiotic therapy.  She is not having any pain.  She does state that  she did have fevers a week ago but none since.  She did not have any  problems with dialysis.   On examination there is a slight amount of erythema on the lateral  aspect of the wound.  However this is very localized.  It is not  expanding.  It is not warm.   At this point I would recommend continuing with her antibiotic therapy.  I think that we may avoid having to remove this graft.  I will not plan  on having her come back to see [place] although if this does become a more  severe problem for her, we would need to remove her graft.     Eldridge Abrahams, MD  Electronically Signed   VWB/MEDQ  D:  03/07/2010  T:  03/08/2010  Job:  [id number]

## 2010-12-27 NOTE — Assessment & Plan Note (Signed)
OFFICE VISIT   JAVERIA, LENIS  DOB:  [birth year]                                       04/15/2010  [id number]   CHIEF COMPLAINT:  Possible infected right thigh graft.   HISTORY OF PRESENT ILLNESS:  This is a 25 year old female that  previously has undergone a right arteriovenous graft that was placed  initially on 10/21/2009.  She required revision of the thigh graft with  an interposition jump graft around a pseudoaneurysm.  This was done in  June 2011.  She then was seen in the ER on 02/21/2010 for possible right  thigh graft infection.  She was placed on antibiotics.  Attempt at  salvage of this arteriovenous graft was obtained due to her young age  and need for continued dialysis.  Apparently this area of possible  infection had resolved with antibiotics, but then on 03/28/2010 she  developed another area of a fluid collection adjacent to the graft.  This was evaluated with an ultrasound and found to be a loculated area  that was nonvascularized adjacent to the graft.  She was restarted on  antibiotics to see if she would improve with the plan to try to avoid  removal of her graft.  She is currently on vancomycin and also Fortaz  for her possible graft infection.  The patient notes that approximately  a week ago it was red and tender to palpation, but over the weekend, on  antibiotics the pain has improved.  She denies any fevers or chills and  never has developed any drainage from this area of firmness at the apex  of her graft.  No signs of dysfunction on dialysis currently.  She  thinks that the size of the area is actually smaller than previously.   PHYSICAL EXAMINATION:  She had a temperature of 98.3 with a blood  pressure 192/114, heart rate of 69, and respirations of 12.  General:  She appeared well developed, well nourished.  No apparent distress.  Focused exam:  The right arteriovenous graft has a palpable thrill, and  bruit  could be auscultated along the entire length of this graft.  At  the apex of this graft, there is an approximately dollar-coin-sized,  firm area adjacent to the graft.  I utilize the SonoSite ultrasound to  evaluate this, which the images I have printed off, along the whole  length of this mass.  It demonstrates nonvascularized, mostly homogenous  collection adjacent to the graft.  I see no signs of active  extravasation from the graft into this mass.  The mass is firm and not  ballottable and appears to be fixed at this point.  There is no erythema  at this site and no tenderness to palpation.  There is an easily  palpable right femoral pulse and dorsal pedis and posterior tibial  pulses.  In addition, I do not appreciate any right inguinal  lymphadenopathy.   MEDICAL DECISION MAKING:  This is a 25 year old female who is now  approximately 2 weeks out from development of possibly a spontaneous  hematoma at the apex of this graft, which may have become secondarily  infected.  She has undergone a course of antibiotics which appears to  have resolved her infection.  On ultrasound, there is no active  extravasation into this mass, and there is no evidence of any fluid  collection adjacent to the graft.  These findings on ultrasound are more  consistent with possible hematoma.  At this point I recommend  continuation and completion of a full 2-week course of antibiotics and  warm compresses to the site of this mass.  Close observation.  The  patient should follow up with Dr. Trula Slade in 2 weeks, as he has been  evaluating her on a regular basis from her previous infection and this  possibly infected hematoma.  I do not feel at this point that any urgent removal of her graft, which  is her only conduit for dialysis at this point, is indicated.     Adele Barthel, MD  Electronically Signed   BC/MEDQ  D:  04/15/2010  T:  04/19/2010  Job:  [id number]
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
## 2010-12-27 NOTE — Assessment & Plan Note (Signed)
OFFICE VISIT   Katie Lyons, Katie Lyons  DOB:  1985/09/29                                       03/28/2010  [id number]   The patient comes back today for followup.  She originally underwent  revision of her right thigh graft secondary to pseudoaneurysm by Dr.  Donnetta Hutching.  I met her when she presented to the emergency department with an  infected aspect of her graft.  Due to her age, I elected to manage this  conservatively.  Fortunately, we were able to get her infection to clear  with just antibiotics, and she did not require removal of her graft.  I  last saw her at the end of July, and she was doing very well.  She is  now off antibiotics.  However, for the last week she has developed a new  area at the apex of her graft which is now red.  She is not having  fevers.   PHYSICAL EXAMINATION:  She is afebrile, hemodynamically stable.  Respirations nonlabored.  Cardiovascular:  Regular rate and rhythm.  At  the apex of her graft, there is a raised up area which is nontender but  is red.   I ordered an ultrasound, and this has been independently reviewed.  The  area of concern shows no evidence of fluid.  It is loculated and  nonvascularized   ASSESSMENT/PLAN:  ?  spontaneous hematoma which has become secondarily  infected.  At this point I would recommend restarting her antibiotics,  giving her a full 2-week course and see if this improves.  Obviously we  are in the same situation as we were last time.  If this does not heal  with antibiotics, she will require removal of her graft, which in her  situation would be a last resort, as she is running out of access sites.  The patient does not know where she dialyzes or the name of her  physician.  We will need to get her antibiotics started.     Eldridge Abrahams, MD  Electronically Signed   VWB/MEDQ  D:  03/28/2010  T:  03/29/2010  Job:  [id number]   cc:   Windy Kalata, M.D.

## 2010-12-27 NOTE — Assessment & Plan Note (Signed)
OFFICE VISIT   Katie Lyons, Katie Lyons  DOB:  [birth year]                                       07/27/2010  [id number]   I saw the patient in the office today for continued follow-up of her  right thigh AV graft.  She had undergone revision of her right thigh AV  graft with evacuation of a lymphocele on 05/28/2010.  She was admitted  on 07/15/2010 with cellulitis over the revised segment of her graft in  the distal part of her loop graft in the right thigh.  She was admitted  and placed on intravenous antibiotics and then ultimately discharged to  receive antibiotics at her dialysis center.  I explained that really the  only options were to continued with conservative treatment and try to  salvage the graft versus removing the graft, which would mean that she  would have to have a left thigh graft placed once the infection had  cleared.  This would be her last remaining long-term access and site.  For this reason, I continue to favor an aggressive approach and not  having to remove her right thigh graft and continuing intravenous  antibiotics.  She comes in to have her graft checked.   On examination, her incisions are all healed.  The cellulitis over the  distal thigh graft has markedly improved.  The graft has an excellent  thrill.  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  However  currently, I do not think the revised segment of graft can be  cannulated; therefore, in order to preserve adequate length of graft for  continued dialysis, I do not think we should address the aneurysm on the  material half of the graft at this point.  This has been chronic and has  not changed significantly in size.  Once the infection clears and the  new segment can be cannulated, we could consider revising this small  aneurysm.   I will plan on seeing her back in 3 weeks.  She knows  to call sooner if  she has problems.  She will continue her intravenous antibiotics at the  time of dialysis.     Judeth Cornfield. Scot Dock, M.D.  Electronically Signed   CSD/MEDQ  D:  07/27/2010  T:  07/28/2010  Job:  [id number]   cc:   Sherril Croon, M.D.

## 2010-12-27 NOTE — Assessment & Plan Note (Signed)
OFFICE VISIT   Katie, Lyons  DOB:  1986-07-19                                       08/18/2010  [id number]   The patient presents today for continued follow-up of her right thigh AV  graft.  She underwent a revision of the right thigh graft with  evacuation of a lymphocele on 05/28/2000.  She was admitted to the  hospital on 07/15/2010 over the right segment of this graft in the  distal portion.  She was admitted and started on IV antibiotics which  have been continued on her dialysis days.  She was receiving IV  vanc/Fortaz.  The patient states she has continued to receive  antibiotics with each dialysis session.  Secondary to the patient's age  and the fact that all of her access has been utilized with the exception  of the left thigh.  All efforts are being made to save this right-side  graft.   On exam, all the incisions are well-healed.  There is a strong thrill in  the graft.  Cellulitis has resolved with no evidence of erythema or skin  breakdown.  There is an aneurysm present on the proximal arterial end of  the graft which the patient states has enlarged since her last  evaluation by Dr. Scot Dock 3 weeks ago.  She also states that the smaller  pseudoaneurysmal portion distal to the original pseudoaneurysm has  arisen since her last evaluation by Dr. Scot Dock.  Both areas of aneurysm  are fairly small, but per the patient are enlarging.  There is no  evidence of skin breakdown or bleeding from these areas.   Dr. Oneida Alar evaluated the patient and felt that she is in need of repair  of these aneurysmal segments on the arterial portion of her graft at  this time.  The previously revised portion of her graft should be usable  for dialysis at this time.  The patient will be scheduled for revision  of her right thigh AV graft with resection of pseudoaneurysmal areas  next week on Tuesday, 08/23/2010 by Dr. Scot Dock.  The patient  understands, as everything was explained completely via an interpreter.  The patient is to inform her nephrologist at dialysis tomorrow of this  decision, and she is to continue IV antibiotics until further notice.   [name], PA   Charles E. Fields, MD  Electronically Signed   AY/MEDQ  D:  08/18/2010  T:  08/18/2010  Job:  [id number]
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
## 2010-12-30 NOTE — Consult Note (Signed)
[name]     ACCOUNT NO.:  [id number]   MEDICAL RECORD NO.:  [id number]          PATIENT TYPE:  INP   LOCATION:  4734                         FACILITY:  [facility]   PHYSICIAN:  Judeth Cornfield. Scot Dock, M.D.DATE OF BIRTH:  10-Dec-1985   DATE OF CONSULTATION:  11/16/2006  DATE OF DISCHARGE:                                 CONSULTATION   REASON FOR CONSULTATION:  1. Placement of hemodialysis catheter today.  2. Future placement of long-term access.   HISTORY:  This is a pleasant 25 year old woman with a history of  gestational hypertension who had presented initially with history of  shortness of breath and was admitted by Dr. Rollene Fare.  She was noted to  have acute renal failure and workup is in progress.  Vascular surgery  was consulted for placement of hemodialysis access catheter today with  plans for later long-term access.  Of note, her past medical history is  significant for cardiomyopathy with reportedly an ejection fraction of  35%.  She also has a history of a pericardial effusion.  She has poorly  controlled hypertension.  At this point the etiology her renal  insufficiency is not clear.   REVIEW OF SYSTEMS:  She does admit to some dyspnea on exertion and mild  orthopnea.   PHYSICAL EXAMINATION:  Temperature 97.6, blood pressure 153/100.  Lungs  are clear bilaterally to auscultation.  She has a palpable brachial and  radial pulse bilaterally.  Her cephalic veins appear quite small.   I have recommend we place a Diatek catheter.  I discussed this with her  through her translator at the bedside and we discussed the indications  for the placement of the catheter and also the potential complications,  including but not limited to bleeding and to catheter infection.  She is  agreeable to proceed.  During this admission, we will map her cephalic  vein in the left arm as she is right-handed and evaluate her for  possible AV fistula or AV graft.      Judeth Cornfield. Scot Dock, M.D.  Electronically Signed     CSD/MEDQ  D:  11/16/2006  T:  11/16/2006  Job:  [id number]

## 2010-12-30 NOTE — Op Note (Signed)
[name]     ACCOUNT NO.:  [id number]   MEDICAL RECORD NO.:  [id number]          PATIENT TYPE:  INP   LOCATION:  4734                         FACILITY:  [facility]   PHYSICIAN:  Judeth Cornfield. Scot Dock, M.D.DATE OF BIRTH:  1986/03/04   DATE OF PROCEDURE:  11/20/2006  DATE OF DISCHARGE:                               OPERATIVE REPORT   PREOPERATIVE DIAGNOSIS:  Chronic renal failure.   POSTOPERATIVE DIAGNOSIS:  Chronic renal failure.   PROCEDURE:  Placement of new right upper arm arteriovenous fistula.   SURGEON:  Judeth Cornfield. Scot Dock, M.D.   ASSISTANT:  Jacinta Shoe, P.A.-C.   ANESTHESIA:  Local with sedation.   INDICATIONS:  This is a 25 year old woman who was originally scheduled  for a left arm AV fistula or AV graft.  However, vein mapping  demonstrated the upper arm cephalic vein on the right with her best  chance for an AV fistula.  Therefore, through the interpreter at the  hospital, I discussed with her placing an upper arm fistula on the right  versus a right arm graft if this was not possible, and she was agreeable  to proceed with the right AV fistula or AV graft.   TECHNIQUE:  The patient was taken to the operating room and sedated by  anesthesia.  The right upper extremity was prepped and draped in the  usual sterile fashion.  After the skin was anesthetized with 1%  lidocaine, a transverse incision was made just above the antecubital  level where the cephalic vein was dissected free.  It was ligated  distally and it did irrigate up with heparinized saline.  It was about a  3.5 to 4 mm vein.  The brachial artery was dissected free beneath the  fascia and the patient was heparinized.  The brachial artery was clamped  proximally and distally and a longitudinal arteriotomy was made.  The  vein was mobilized over and sewn end-to-side to the artery using  continuous 6-0 Prolene suture.  At completion, there was an excellent  thrill in the fistula  and a palpable radial pulse.  Hemostasis was  obtained in the wound.  The wound was closed with a deep layer of 3-0  Vicryl and the skin closed with 4-0 Vicryl.  A sterile dressing was  applied.  The patient tolerated the procedure well and was transferred  to the recovery room in satisfactory condition.  All needle and sponge  counts were correct.      Judeth Cornfield. Scot Dock, M.D.  Electronically Signed     CSD/MEDQ  D:  11/20/2006  T:  11/20/2006  Job:  [id number]

## 2010-12-30 NOTE — Op Note (Signed)
[name]     ACCOUNT NO.:  [id number]   MEDICAL RECORD NO.:  [id number]          PATIENT TYPE:  INP   LOCATION:  4734                         FACILITY:  [facility]   PHYSICIAN:  Judeth Cornfield. Scot Dock, M.D.DATE OF BIRTH:  Jun 18, 1986   DATE OF PROCEDURE:  11/16/2006  DATE OF DISCHARGE:                               OPERATIVE REPORT   PREOPERATIVE DIAGNOSIS:  Chronic renal failure.   POSTOPERATIVE DIAGNOSIS:  Chronic renal failure.   PROCEDURE:  Ultrasound-guided placement of a right IJ Diatek catheter.   SURGEON:  Judeth Cornfield. Scot Dock, M.D.   ANESTHESIA:  Local with sedation.   TECHNIQUE:  The patient was taken to the operating and sedated by  anesthesia.  The neck and upper chest were prepped and draped in usual  sterile fashion.  After the skin was anesthetized with 1% lidocaine, the  IJ's had been identified with the ultrasound scanner had been marked.  The right IJ was cannulated and a guidewire introduced into the right  atrium under fluoroscopic control.  The tract over the wire was dilated,  and the dilator and peel-away sheath were passed over the wire; and the  wire and dilator were removed.  The catheter was passed through the peel-  away sheath and positioned in the right atrium.  The exit site for the  catheter was selected and the skin was anesthetized between the two  areas.   The catheter was then brought through the tunnel cut to the appropriate  length and the distal ports were attached.  Both ports withdrew easily.  We then flushed with heparinized saline and filled with concentrated  heparin.  The catheter was secured at its exit site with 3-0 nylon  suture.  The IJ cannulation site was closed with a 4-0 subcuticular  stitch.  A sterile dressing was applied.  The patient tolerated the  procedure well and was transferred to the recovery room in satisfactory  condition.  All needle and sponge counts were correct.      Judeth Cornfield.  Scot Dock, M.D.  Electronically Signed     CSD/MEDQ  D:  11/16/2006  T:  11/17/2006  Job:  [id number]

## 2010-12-30 NOTE — Discharge Summary (Signed)
[name]     ACCOUNT NO.:  [id number]   MEDICAL RECORD NO.:  [id number]          PATIENT TYPE:  INP   LOCATION:  4734                         FACILITY:  [facility]   PHYSICIAN:  Richard A. Rollene Fare, M.D.DATE OF BIRTH:  11/27/1985   DATE OF ADMISSION:  11/14/2006  DATE OF DISCHARGE:  11/23/2006                               DISCHARGE SUMMARY   DISCHARGE DIAGNOSES:  1. Congestive heart failure with left ventricular dysfunction,      ejection fraction 35%.  2. End-stage renal disease of unknown etiology now on hemodialysis.  3. Pericardial effusion.  4. Anemia of chronic disease.  5. Hypertension.   HISTORY OF PRESENT ILLNESS AND HOSPITAL COURSE:  This is a 25 year old  [place] woman who presented to our office on November 14, 2006 with  complaints of progressive dyspnea over the period of several weeks,  especially on exertion, and dry nonproductive cough.  She also noticed  having orthopnea and PND and because of these symptoms went to urgent  care.  She was evaluated there, was found to be anemic with a hemoglobin  of 8.8 and chest x-ray showing cardiomegaly and bilateral pulmonary  congestion.  Patient was evaluated by the PA at [facility] and  referred to our office with diagnosis of congestive heart failure.  Dr.  Rollene Fare saw the patient and ordered a 2-D echo which was performed on  the same day at our office, it revealed left ventricular dysfunction  with EF around 35%, mildly dilated left ventricle with mild concentric  ventricular hypertrophy, late systolic partial right atrial collapse was  seen compatible with mild early tamponade etiology.  There was no severe  mitral regurgitation, but circumferential pericardial effusion was noted  of moderate size.  There was also mild-to-moderate tricuspid  regurgitation and mild-to-moderate aortic regurgitation.  Based on all  these findings, patient was admitted to [facility] to the  telemetry  unit on Lasix diuretic therapy.  Her creatinine was elevated  up to 8 and we requested nephrology consult.   On November 14, 2006, Dr. Justin Mend saw patient in consult and it was thought  that patient was in acute renal failure with differential diagnosis  including prerenal azotemia, renal disease, post renal obstruction, also  the possibility of autoimmune disease versus viral illness versus volume  overload from CHF versus hypertensive nephropathy versus medication-  induced nephropathy versus bacterial infection was entertained.  Patient  also was in gap metabolic acidosis which was thought to be likely  secondary to acute renal failure.   On next day, her BNP revealed 3200, hemoglobin improved to 9.3, but her  creatinine and BUN continued to deteriorate and the decision was made to  place hemodialysis Diatek catheter for hemodialysis access, it was done  on April 4 and patient had her first hemodialysis on that day.   On November 20, 2006, she underwent right upper arm AV graft/fistula  placement by Dr. Scot Dock.  During this stay in the hospital, her  condition gradually improved and patient did not have any complaints of  dyspnea or chest discomfort, her BUN and creatinine significantly  improved as well as hemoglobin and on  April 11, she had a session of  hemodialysis and after procedure given patient's stable condition, she  would be discharged home.   Her blood pressure fluctuates between 137/93 to 173/88 pre-hemodialysis  and post dialysis her blood pressure improved to 126/89.   HOSPITAL LABORATORIES AND PERTINENT STUDIES:  Hemoglobin on day of  discharge 9.7, hematocrit 28.1, white blood cell count 9.9 and platelet  count 435.  Renal function revealed sodium 138, potassium 4.2, chloride  102, CO2 28, glucose 90, BUN 28, creatinine 5.84.  Glomerular filtration  rate was 9, albumin 10.4, calcium 8.4, phosphorous 4.7.  A 24-hour urine  creatinine 50.1 and 24-hour creatinine clearance 150  with total volume  of UCRA 300 on collection, UCRE 24.  A 24-hour urine protein was 200 and  last BNP was 198 which is a significantly reduction from the previous  greater than 3200.  IgA immunoglobulin was 209, IgG 760 and IgM 74, all  of them within normal limits.  Protein electrophoresis revealed total  protein 5.9, albumin 47.4, alpha-1 serum protein 11 which was elevated,  alpha-2 serum protein 17.3 also elevated, beta-serum 6.8 and gamma  globulin 12.7.  Antineutrophil cyto antibody antigen was less than 1/20  which was within the normal reference range.  Parathyroid hormone  elevated to 399.4 and total calcium 6.2 low.  Cardiolipin antibody  antigen less than 7 which is lower than normal range.  Cardiolipin  antibody IgM of less than 7 also lower than normal range.  Cardiolipin  antibody IgA less than 7 lower than the normal range as well.   TSH was 0.296.  Total iron was 14, total iron-binding capacity 227,  percent saturations 6 and UIBC 213.  Vitamin 123456 A999333, folic acid Q000111Q.  Erythrocyte sedimentation rate greater than 140, reticulocyte count 3.45  and reticulocyte percent 2.0.   Her EKG in the hospital showed sinus rhythm, no acute ST-T wave changes  and her telemetry consistently showed sinus rhythm as well.   Chest x-ray on November 21, 2006 showed stable cardiomegaly and pulmonary  vascular congestion, but no acute findings.   Her peak creatinine in the hospital was 9.11 and BUN 91 on November 17, 2006  and the lowest BUN and creatinine was on November 22, 2006 with BUN being  11 and creatinine 3.44.   DISCHARGE MEDICATIONS:  1. Calcium carbonate 500 mg with every meal.  2. Aranesp 100 mcg with dialysis every Saturday.  3. Hectorol 1 mcg with dialysis.  4. Folic acid 2 mg daily.  5. Nu-Iron 150 mg twice a day.  6. Nephrovite daily.  7. Coreg 6.25 mg twice a day.  8. Digoxin 0.062 mg daily.  9. Hydralazine 25 mg four times a day.  DISCHARGE DIET:  Renal diet with 800 mg  phosphorus a day.   ACTIVITY:  As tolerated.   WOUND CARE:  Patient was not allowed to shower because of her Diatek  catheter, only sponge bath to keep the catheter site dry.   FOLLOWUP APPOINTMENTS:  She will be seen at dialysis at [facility] on  the day of her dialysis, her days are on Tuesday, Thursday and Saturday  at 6:30 a.m. and Dr. Rollene Fare will see patient on December 10, 2006 at  noon, our phone number at the office and the address were provided.      York Grice, P.A.      Richard A. Rollene Fare, M.D.  Electronically Signed    MK/MEDQ  D:  11/23/2006  T:  11/23/2006  Job:  [id number]   cc:   [facility]  [facility]  Dr. Justin Mend  [facility]

## 2010-12-30 NOTE — Consult Note (Signed)
[name]     ACCOUNT NO.:  [id number]   MEDICAL RECORD NO.:  [id number]          PATIENT TYPE:  INP   LOCATION:  [id number]                         FACILITY:  [facility]   PHYSICIAN:  Katie Lyons, M.D.   DATE OF BIRTH:  [birth year]   DATE OF CONSULTATION:  11/14/2006  DATE OF DISCHARGE:                                 CONSULTATION   CONSULTANT:  This consult is by Dr. Edrick Lyons.   REQUESTING PHYSICIAN:  Dr. Rollene Lyons.   REASON FOR CONSULTATION:  Elevated creatinine.   HISTORY OF PRESENT ILLNESS:  [name] is a 25 year old  [place] female with a history of gestational hypertension who presented  to Dr. Rollene Lyons with a 2-week history of shortness of breath, cough,  and fevers.  She was seen in the urgent care center one day prior to  admission for the same and was found to have possible CHF and  hypertension.  She was seen by Dr. Rollene Lyons for further evaluation who  performed a 2D echo which revealed a pericardial effusion with late  systolic right atrial collapse.  Therefore, the patient was admitted.  She was also found to be anemic at the urgent care center and a chest x-  ray showed severe cardiomegaly plus bilateral pulmonary congestion.  She  had a blood pressure of 170/117 at the urgent care center and a UA done  one day prior to admission at the urgent care center was positive for  blood and protein.  Her white blood cell count one day prior to  admission was 10.4. H and H was 7.9 and 24.  This history is obtained  from her boyfriend who speaks limited English and the chart.  We are  consulted secondary to newly discovered elevated creatinine at 8 and  evaluation thereof.   PAST MEDICAL HISTORY:  Significant for hypertension with pregnancy.   MEDICATIONS AT HOME:  None.   ALLERGIES:  NO KNOWN DRUG ALLERGIES.   SOCIAL HISTORY:  The patient lives with her boyfriend and her 78-month-  old daughter.  The patient is from [place] and has been in  the [place] for 2 years.  She is currently unemployed.  She denies tobacco,  alcohol, or illicit drug use.   FAMILY HISTORY:  Significant for a mother who passed away at the age of  16 question of sudden death.  Her father is alive with unknown medical  issues.  She has 6 brothers and 4 sisters with unknown medical history.  She has one daughter who is alive and well at 70-months-old living with  her in the [place].   REVIEW OF SYSTEMS:  Positive for orthopnea, paroxysmal nocturnal  dyspnea, shortness of breath, dyspnea on exertion, dry and constant  cough, and fevers.  She denies any urinary symptoms or sick contacts.   PHYSICAL EXAMINATION:  VITAL SIGNS ON ADMISSION:  Temperature was 97.4,  pulse 97, respiration rate 18, blood pressure 158/110, she was  saturating 100% on room air.  Weight is 48.7 kilograms.  GENERAL:  She was awake, alert, and oriented times 3 and in mild  respiratory distress, coughing.  HEENT EXAM:  Reveals extraocular  movements intact.  Pupils equally round  and reactive to light.  Oropharynx is clear.  There was no thyromegaly,  no lymphadenopathy.  LUNGS:  Clear to auscultation bilaterally with no wheezes, rales, or  rhonchi.  Although, Dr. Justin Lyons auscultated crackles on the right base.  CARDIOVASCULAR:  Positive for an S3, and a systolic ejection murmur that  was loudest at the left sternal base.  No rub was auscultated.  ABDOMEN:  Soft.  Bowel sounds are present.  She was tender to palpation  mildly and diffusely left greater than right.  EXTREMITIES:  Showed no clubbing or cyanosis.  Trace non-pitting  bilateral edema of the ankles.  NEUROLOGICAL EXAM:  Nonfocal.  GU EXAM:  Revealed right greater than left CVA tenderness.   LABORATORY DATA:  Significant for a UA with a specific gravity of 1.016,  moderate bilirubin, 15 of ketones, large blood, greater than 300  protein, positive nitrites, moderate leukocyte esterase, too numerous to  count  RBCs and 0 to 2 WBCs.  A retic count was 2%.  ESR greater than  140.  Magnesium 2.7.  PTT was 39, PT was 15, and an INR of 1.2.  White  blood cell count 14.1, hemoglobin 9.4, platelets 352,000.  AMC 11.2.  MCV 80.4.  RDW 17.6.  Sodium 138, potassium 4.2, chloride of 104, Bicarb  18, BUN 81, creatinine 8.73, glucose 101, calcium 7.1, anime gap was 16.  Total bilirubin 0.6,  alk-phos 122, ASP 10, ALP 13, total protein 7.4,  albumin 2.9.  There was a chest x-ray and a renal ultrasound pending.   ASSESSMENT:  This is a 25 year old female with progressive shortness of  breath with:  1. Congestive heart failure with an ejection fraction of 35% and a BMP      in the 400s.  2. Pericardial effusion with late right atrial collapse.  3. Acute renal failure.  4. Gapped metabolic acidosis.  5. Hematuria.  6. Proteinuria.  7. Elevated ESR.  8. Melanocytic anemia.  9. Leukocytosis.  10.History of pregnancy induced hypertension.   PLAN:  1. Acute renal failure.  UA has been done.  Dr. Justin Lyons has ordered a      sediment which will be reviewed later.  Renal ultrasound was done      and is pending.      a.     Nonoliguric.      b.     Check urine culture, urine sodium/creatinine, phosphorus,       magnesium.      c.     Differential diagnosis includes prerenal azotemia, renal       disease, or post-renal obstruction.      d.     Could be secondary to autoimmune disease versus viral       illness versus volume overload from CHF versus hypertension dys-       nephropathy versus medication induced versus bacterial infection       (not likely given no pyuria).      e.     Given elevated ESR and multiple organs affected, I favor a       viral versus autoimmune process or possibly Chagas disease.  2. Gapped metabolic acidosis.  Recommend:      a.     Check ketones (doubt DKA as glucose is normal but could be       starvation).     b.     Check lactic acid level.      c.  Check  salicylates/acetaminophen levels.      d.     Most likely secondary to acute renal failure, however.      e.     Check ABG for pH.  3. Anemia, normocytic.      a.     Check iron studies.      b.     Check hemoccult of stools.  4. Leukocytosis.      a.     Check urine culture, blood culture, sputum culture.      b.     Check chest x-ray.   The above was discussed with Dr. Justin Lyons.  He also recommends checking a  hepatitis panel and a HIV, and the patient may need to proceed to a  renal biopsy.  There does not appear to be any nephrotic injuries  currently.      Rico Sheehan, D.O.  Electronically Signed      Katie Lyons, M.D.  Electronically Signed    EA/MEDQ  D:  11/14/2006  T:  11/15/2006  Job:  [id number]

## 2011-05-04 LAB — CBC
HCT: 36
HCT: 39
Hemoglobin: 13.2
MCHC: 33.7
MCHC: 34.3
MCV: 88
MCV: 88.7
Platelets: 280
RBC: 4.4
RDW: 14.3
WBC: 13.2 — ABNORMAL HIGH

## 2011-05-04 LAB — RENAL FUNCTION PANEL
BUN: 25 — ABNORMAL HIGH
CO2: 30
Calcium: 9.9
Glucose, Bld: 87
Phosphorus: 3.7
Sodium: 138

## 2011-05-04 LAB — COMPREHENSIVE METABOLIC PANEL
BUN: 60 — ABNORMAL HIGH
CO2: 25
Calcium: 9.6
Chloride: 97
Creatinine, Ser: 8.07 — ABNORMAL HIGH
GFR calc Af Amer: 7 — ABNORMAL LOW
GFR calc non Af Amer: 6 — ABNORMAL LOW
Total Bilirubin: 0.5

## 2011-05-04 LAB — PHOSPHORUS: Phosphorus: 4.7 — ABNORMAL HIGH

## 2011-05-19 LAB — CULTURE, BLOOD (ROUTINE X 2): Culture: NO GROWTH

## 2011-05-19 LAB — CARDIAC PANEL(CRET KIN+CKTOT+MB+TROPI)
CK, MB: 0.7
Troponin I: 0.04

## 2011-05-19 LAB — DIFFERENTIAL
Basophils Absolute: 0
Basophils Absolute: 0.1
Basophils Relative: 0
Eosinophils Absolute: 0.2
Eosinophils Absolute: 1.2 — ABNORMAL HIGH
Eosinophils Absolute: 1.5 — ABNORMAL HIGH
Eosinophils Relative: 15 — ABNORMAL HIGH
Lymphs Abs: 2
Lymphs Abs: 2.1
Monocytes Absolute: 0.5
Monocytes Relative: 1 — ABNORMAL LOW
Neutro Abs: 8.5 — ABNORMAL HIGH
Neutrophils Relative %: 58
Neutrophils Relative %: 90 — ABNORMAL HIGH

## 2011-05-19 LAB — CBC
HCT: 32.2 — ABNORMAL LOW
HCT: 33.5 — ABNORMAL LOW
HCT: 36.2
Hemoglobin: 11 — ABNORMAL LOW
Hemoglobin: 11.3 — ABNORMAL LOW
MCHC: 33.7
MCHC: 34
MCV: 88.7
MCV: 88.9
MCV: 89.2
Platelets: 178
Platelets: 195
RBC: 3.64 — ABNORMAL LOW
RBC: 3.76 — ABNORMAL LOW
RDW: 13.5
RDW: 13.9
RDW: 13.9
WBC: 9.5
WBC: 9.6

## 2011-05-19 LAB — POCT I-STAT 3, ART BLOOD GAS (G3+)
Acid-Base Excess: 2
Bicarbonate: 25 — ABNORMAL HIGH
Operator id: 145191
Patient temperature: 99
pH, Arterial: 7.495 — ABNORMAL HIGH

## 2011-05-19 LAB — I-STAT 8, (EC8 V) (CONVERTED LAB)
Acid-Base Excess: 3 — ABNORMAL HIGH
Acid-base deficit: 11 — ABNORMAL HIGH
Bicarbonate: 16.5 — ABNORMAL LOW
Bicarbonate: 24.8 — ABNORMAL HIGH
Chloride: 108
HCT: 37
Operator id: 294501
TCO2: 18
TCO2: 26
pCO2, Ven: 29.8 — ABNORMAL LOW
pCO2, Ven: 40.4 — ABNORMAL LOW
pH, Ven: 7.219 — ABNORMAL LOW
pH, Ven: 7.529 — ABNORMAL HIGH

## 2011-05-19 LAB — COMPREHENSIVE METABOLIC PANEL
ALT: 81 — ABNORMAL HIGH
Alkaline Phosphatase: 115
BUN: 55 — ABNORMAL HIGH
CO2: 22
Calcium: 8.3 — ABNORMAL LOW
GFR calc non Af Amer: 7 — ABNORMAL LOW
Glucose, Bld: 102 — ABNORMAL HIGH
Sodium: 138
Total Protein: 6

## 2011-05-19 LAB — LIPASE, BLOOD: Lipase: 15

## 2011-05-19 LAB — AMYLASE: Amylase: 141 — ABNORMAL HIGH

## 2011-05-19 LAB — POCT CARDIAC MARKERS
CKMB, poc: <1 — ABNORMAL LOW
Myoglobin, poc: 166
Operator id: 294511
Troponin i, poc: <0.05

## 2011-05-19 LAB — RENAL FUNCTION PANEL
Albumin: 3.6
BUN: 20
Calcium: 9
Creatinine, Ser: 4.36 — ABNORMAL HIGH
Glucose, Bld: 141 — ABNORMAL HIGH
Phosphorus: 4.9 — ABNORMAL HIGH

## 2011-05-19 LAB — HCG, QUANTITATIVE, PREGNANCY: hCG, Beta Chain, Quant, S: <2

## 2011-05-19 LAB — PHOSPHORUS: Phosphorus: 3

## 2012-03-11 ENCOUNTER — Other Ambulatory Visit (HOSPITAL_COMMUNITY): Payer: Self-pay | Admitting: *Deleted

## 2012-03-12 ENCOUNTER — Encounter (HOSPITAL_COMMUNITY)
Admission: RE | Admit: 2012-03-12 | Discharge: 2012-03-12 | Disposition: A | Discharge status: Home / Self Care | Payer: Self-pay | Source: Ambulatory Visit | Attending: Nephrology | Admitting: Nephrology

## 2012-03-12 DIAGNOSIS — D649 Anemia, unspecified: Secondary | ICD-10-CM | POA: Insufficient documentation

## 2012-03-12 MED ORDER — DIPHENHYDRAMINE HCL 25 MG PO CAPS
25.0000 mg | ORAL_CAPSULE | Freq: Once | ORAL | Status: AC
  Administered 2012-03-12: 25 mg via ORAL
  Filled 2012-03-12: qty 1

## 2012-03-12 MED ORDER — ACETAMINOPHEN 325 MG PO TABS
325.0000 mg | ORAL_TABLET | Freq: Once | ORAL | Status: AC
  Administered 2012-03-12: 325 mg via ORAL
  Filled 2012-03-12: qty 2

## 2012-03-12 NOTE — Progress Notes (Signed)
Interpreter Lesle Chris for transfusions.

## 2012-03-12 NOTE — Progress Notes (Signed)
Translator present for duration of patient visit and assisted patient with consent form and all communication.

## 2012-03-13 LAB — TYPE AND SCREEN
ABO/RH(D): O POS
Unit division: 0

## 2012-11-09 ENCOUNTER — Emergency Department (HOSPITAL_COMMUNITY): Payer: Medicaid Other

## 2012-11-09 ENCOUNTER — Encounter (HOSPITAL_COMMUNITY): Payer: Self-pay | Admitting: Neurological Surgery

## 2012-11-09 ENCOUNTER — Inpatient Hospital Stay (HOSPITAL_COMMUNITY)
Admit: 2012-11-09 | Discharge: 2012-11-15 | DRG: 082 | Disposition: A | Discharge status: Home Health | Payer: Medicaid Other | Attending: Surgery | Admitting: Surgery

## 2012-11-09 DIAGNOSIS — S0100XA Unspecified open wound of scalp, initial encounter: Secondary | ICD-10-CM

## 2012-11-09 DIAGNOSIS — J95821 Acute postprocedural respiratory failure: Secondary | ICD-10-CM | POA: Diagnosis present

## 2012-11-09 DIAGNOSIS — S025XXA Fracture of tooth (traumatic), initial encounter for closed fracture: Secondary | ICD-10-CM | POA: Diagnosis present

## 2012-11-09 DIAGNOSIS — Z992 Dependence on renal dialysis: Secondary | ICD-10-CM

## 2012-11-09 DIAGNOSIS — S069XAA Unspecified intracranial injury with loss of consciousness status unknown, initial encounter: Secondary | ICD-10-CM

## 2012-11-09 DIAGNOSIS — S0101XA Laceration without foreign body of scalp, initial encounter: Secondary | ICD-10-CM

## 2012-11-09 DIAGNOSIS — S1190XA Unspecified open wound of unspecified part of neck, initial encounter: Secondary | ICD-10-CM | POA: Diagnosis present

## 2012-11-09 DIAGNOSIS — S0636AA Traumatic hemorrhage of cerebrum, unspecified, with loss of consciousness status unknown, initial encounter: Secondary | ICD-10-CM

## 2012-11-09 DIAGNOSIS — S06309A Unspecified focal traumatic brain injury with loss of consciousness of unspecified duration, initial encounter: Principal | ICD-10-CM | POA: Diagnosis present

## 2012-11-09 DIAGNOSIS — S1191XA Laceration without foreign body of unspecified part of neck, initial encounter: Secondary | ICD-10-CM

## 2012-11-09 DIAGNOSIS — R569 Unspecified convulsions: Secondary | ICD-10-CM | POA: Diagnosis present

## 2012-11-09 DIAGNOSIS — I120 Hypertensive chronic kidney disease with stage 5 chronic kidney disease or end stage renal disease: Secondary | ICD-10-CM | POA: Diagnosis present

## 2012-11-09 DIAGNOSIS — F329 Major depressive disorder, single episode, unspecified: Secondary | ICD-10-CM | POA: Diagnosis present

## 2012-11-09 DIAGNOSIS — J9600 Acute respiratory failure, unspecified whether with hypoxia or hypercapnia: Secondary | ICD-10-CM | POA: Diagnosis present

## 2012-11-09 DIAGNOSIS — D631 Anemia in chronic kidney disease: Secondary | ICD-10-CM | POA: Diagnosis present

## 2012-11-09 DIAGNOSIS — IMO0002 Reserved for concepts with insufficient information to code with codable children: Secondary | ICD-10-CM

## 2012-11-09 DIAGNOSIS — N186 End stage renal disease: Secondary | ICD-10-CM | POA: Diagnosis present

## 2012-11-09 DIAGNOSIS — R51 Headache: Secondary | ICD-10-CM | POA: Diagnosis not present

## 2012-11-09 DIAGNOSIS — R42 Dizziness and giddiness: Secondary | ICD-10-CM | POA: Diagnosis not present

## 2012-11-09 DIAGNOSIS — F3289 Other specified depressive episodes: Secondary | ICD-10-CM | POA: Diagnosis present

## 2012-11-09 DIAGNOSIS — S069X9A Unspecified intracranial injury with loss of consciousness of unspecified duration, initial encounter: Secondary | ICD-10-CM

## 2012-11-09 DIAGNOSIS — N2581 Secondary hyperparathyroidism of renal origin: Secondary | ICD-10-CM | POA: Diagnosis present

## 2012-11-09 DIAGNOSIS — Y998 Other external cause status: Secondary | ICD-10-CM

## 2012-11-09 DIAGNOSIS — S06369A Traumatic hemorrhage of cerebrum, unspecified, with loss of consciousness of unspecified duration, initial encounter: Secondary | ICD-10-CM

## 2012-11-09 DIAGNOSIS — Y92410 Unspecified street and highway as the place of occurrence of the external cause: Secondary | ICD-10-CM

## 2012-11-09 DIAGNOSIS — D62 Acute posthemorrhagic anemia: Secondary | ICD-10-CM | POA: Clinically undetermined

## 2012-11-09 HISTORY — DX: Depression, unspecified: F32.A

## 2012-11-09 HISTORY — DX: Major depressive disorder, single episode, unspecified: F32.9

## 2012-11-09 LAB — POCT I-STAT 3, ART BLOOD GAS (G3+)
Acid-Base Excess: 7 mmol/L — ABNORMAL HIGH (ref 0.0–2.0)
Bicarbonate: 29.1 meq/L — ABNORMAL HIGH (ref 20.0–24.0)
O2 Saturation: 100 %
Patient temperature: 98.6
TCO2: 30 mmol/L (ref 0–100)
pCO2 arterial: 31 mmHg — ABNORMAL LOW (ref 35.0–45.0)
pH, Arterial: 7.581 — ABNORMAL HIGH (ref 7.350–7.450)
pO2, Arterial: 471 mmHg — ABNORMAL HIGH (ref 80.0–100.0)

## 2012-11-09 LAB — COMPREHENSIVE METABOLIC PANEL
Alkaline Phosphatase: 112 U/L (ref 39–117)
BUN: 49 mg/dL — ABNORMAL HIGH (ref 6–23)
Chloride: 89 mEq/L — ABNORMAL LOW (ref 96–112)
GFR calc Af Amer: 8 mL/min — ABNORMAL LOW (ref >90)
Glucose, Bld: 129 mg/dL — ABNORMAL HIGH (ref 70–99)
Potassium: 3.4 mEq/L — ABNORMAL LOW (ref 3.5–5.1)
Total Bilirubin: 0.1 mg/dL — ABNORMAL LOW (ref 0.3–1.2)

## 2012-11-09 LAB — POCT I-STAT, CHEM 8
Hemoglobin: 10.9 g/dL — ABNORMAL LOW (ref 12.0–15.0)
Sodium: 135 mEq/L (ref 135–145)
TCO2: 29 mmol/L (ref 0–100)

## 2012-11-09 LAB — CBC
Platelets: 213 10*3/uL (ref 150–400)
RDW: 13.4 % (ref 11.5–15.5)
WBC: 11.7 10*3/uL — ABNORMAL HIGH (ref 4.0–10.5)

## 2012-11-09 LAB — PROTIME-INR: INR: 1.05 (ref 0.00–1.49)

## 2012-11-09 MED ORDER — HYDROMORPHONE HCL PF 1 MG/ML IJ SOLN
INTRAMUSCULAR | Status: AC | PRN
  Administered 2012-11-09: 4 mg

## 2012-11-09 MED ORDER — MIDAZOLAM HCL 2 MG/2ML IJ SOLN
INTRAMUSCULAR | Status: AC
  Filled 2012-11-09: qty 8

## 2012-11-09 MED ORDER — MIDAZOLAM HCL 5 MG/5ML IJ SOLN
5.0000 mg | Freq: Once | INTRAMUSCULAR | Status: AC
  Administered 2012-11-09: 5 mg via INTRAVENOUS

## 2012-11-09 MED ORDER — MIDAZOLAM HCL 2 MG/2ML IJ SOLN
INTRAMUSCULAR | Status: AC
  Filled 2012-11-09: qty 10

## 2012-11-09 MED ORDER — ROCURONIUM BROMIDE 50 MG/5ML IV SOLN
INTRAVENOUS | Status: AC | PRN
  Administered 2012-11-09: 50 mg via INTRAVENOUS

## 2012-11-09 MED ORDER — ROCURONIUM BROMIDE 50 MG/5ML IV SOLN
INTRAVENOUS | Status: AC
  Filled 2012-11-09: qty 2

## 2012-11-09 MED ORDER — LIDOCAINE HCL (CARDIAC) 20 MG/ML IV SOLN
INTRAVENOUS | Status: AC
  Filled 2012-11-09: qty 5

## 2012-11-09 MED ORDER — ETOMIDATE 2 MG/ML IV SOLN
INTRAVENOUS | Status: AC | PRN
  Administered 2012-11-09: 20 mg via INTRAVENOUS

## 2012-11-09 MED ORDER — HYDROMORPHONE HCL PF 1 MG/ML IJ SOLN
INTRAMUSCULAR | Status: AC
  Filled 2012-11-09: qty 4

## 2012-11-09 MED ORDER — SUCCINYLCHOLINE CHLORIDE 20 MG/ML IJ SOLN
INTRAMUSCULAR | Status: AC
  Filled 2012-11-09: qty 1

## 2012-11-09 MED ORDER — LORAZEPAM 2 MG/ML IJ SOLN
INTRAMUSCULAR | Status: AC
  Filled 2012-11-09: qty 2

## 2012-11-09 MED ORDER — SODIUM CHLORIDE 0.9 % IV SOLN
Freq: Once | INTRAVENOUS | Status: AC
  Administered 2012-11-09: via INTRAVENOUS

## 2012-11-09 MED ORDER — IOHEXOL 300 MG/ML  SOLN
80.0000 mL | Freq: Once | INTRAMUSCULAR | Status: AC | PRN
  Administered 2012-11-09: 80 mL via INTRAVENOUS

## 2012-11-09 MED ORDER — ETOMIDATE 2 MG/ML IV SOLN
INTRAVENOUS | Status: AC
  Filled 2012-11-09: qty 20

## 2012-11-09 MED ORDER — LORAZEPAM 2 MG/ML IJ SOLN
INTRAMUSCULAR | Status: AC | PRN
  Administered 2012-11-09: 4 mg via INTRAVENOUS
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
## 2012-11-09 NOTE — ED Notes (Signed)
Patient transported to CT 

## 2012-11-09 NOTE — ED Notes (Signed)
Back from CT
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
## 2012-11-10 ENCOUNTER — Encounter (HOSPITAL_COMMUNITY): Payer: Self-pay | Admitting: *Deleted

## 2012-11-10 ENCOUNTER — Inpatient Hospital Stay (HOSPITAL_COMMUNITY): Payer: Medicaid Other

## 2012-11-10 LAB — CBC WITH DIFFERENTIAL/PLATELET
Basophils Relative: 0 % (ref 0–1)
Eosinophils Absolute: 0.3 10*3/uL (ref 0.0–0.7)
Eosinophils Relative: 1 % (ref 0–5)
Hemoglobin: 6.6 g/dL — CL (ref 12.0–15.0)
Lymphocytes Relative: 12 % (ref 12–46)
Neutrophils Relative %: 82 % — ABNORMAL HIGH (ref 43–77)
Platelets: 152 10*3/uL (ref 150–400)
RBC: 2.07 MIL/uL — ABNORMAL LOW (ref 3.87–5.11)

## 2012-11-10 LAB — CBC
MCH: 29.9 pg (ref 26.0–34.0)
MCHC: 35.3 g/dL (ref 30.0–36.0)
MCV: 84.8 fL (ref 78.0–100.0)
Platelets: 120 10*3/uL — ABNORMAL LOW (ref 150–400)

## 2012-11-10 LAB — BASIC METABOLIC PANEL
Calcium: 7.7 mg/dL — ABNORMAL LOW (ref 8.4–10.5)
Creatinine, Ser: 8.09 mg/dL — ABNORMAL HIGH (ref 0.50–1.10)
GFR calc non Af Amer: 6 mL/min — ABNORMAL LOW (ref >90)
Glucose, Bld: 134 mg/dL — ABNORMAL HIGH (ref 70–99)
Sodium: 135 mEq/L (ref 135–145)

## 2012-11-10 LAB — BLOOD GAS, ARTERIAL
Bicarbonate: 20.8 mEq/L (ref 20.0–24.0)
Drawn by: 28701
FIO2: 0.4 %
O2 Saturation: 99.7 %
PEEP: 5 cmH2O
pCO2 arterial: 29.9 mmHg — ABNORMAL LOW (ref 35.0–45.0)
pO2, Arterial: 192 mmHg — ABNORMAL HIGH (ref 80.0–100.0)

## 2012-11-10 LAB — PREPARE RBC (CROSSMATCH)

## 2012-11-10 LAB — ABO/RH: ABO/RH(D): O POS

## 2012-11-10 MED ORDER — SODIUM CHLORIDE 0.9 % IV SOLN
1.0000 mg/h | INTRAVENOUS | Status: DC
  Filled 2012-11-10: qty 10

## 2012-11-10 MED ORDER — SODIUM CHLORIDE 0.9 % IV SOLN
25.0000 ug/h | INTRAVENOUS | Status: DC
  Administered 2012-11-10: 25 ug/h via INTRAVENOUS
  Filled 2012-11-10: qty 50

## 2012-11-10 MED ORDER — ONDANSETRON HCL 4 MG/2ML IJ SOLN
INTRAMUSCULAR | Status: AC
  Filled 2012-11-10: qty 2

## 2012-11-10 MED ORDER — ONDANSETRON HCL 4 MG/2ML IJ SOLN
4.0000 mg | INTRAMUSCULAR | Status: DC | PRN
  Administered 2012-11-10: 4 mg via INTRAVENOUS

## 2012-11-10 MED ORDER — CEFAZOLIN SODIUM 1-5 GM-% IV SOLN
1.0000 g | Freq: Three times a day (TID) | INTRAVENOUS | Status: DC
  Administered 2012-11-10 – 2012-11-11 (×4): 1 g via INTRAVENOUS
  Filled 2012-11-10 (×6): qty 50

## 2012-11-10 MED ORDER — BIOTENE DRY MOUTH MT LIQD
15.0000 mL | Freq: Four times a day (QID) | OROMUCOSAL | Status: DC
  Administered 2012-11-10 – 2012-11-11 (×7): 15 mL via OROMUCOSAL

## 2012-11-10 MED ORDER — MIDAZOLAM BOLUS VIA INFUSION
1.0000 mg | INTRAVENOUS | Status: DC | PRN
  Filled 2012-11-10: qty 2

## 2012-11-10 MED ORDER — SODIUM CHLORIDE 0.9 % IV SOLN
INTRAVENOUS | Status: DC
  Administered 2012-11-10: 03:00:00 via INTRAVENOUS

## 2012-11-10 MED ORDER — PANTOPRAZOLE SODIUM 40 MG PO TBEC
40.0000 mg | DELAYED_RELEASE_TABLET | Freq: Every day | ORAL | Status: DC
  Administered 2012-11-12 – 2012-11-15 (×4): 40 mg via ORAL
  Filled 2012-11-10 (×2): qty 1

## 2012-11-10 MED ORDER — INFLUENZA VIRUS VACC SPLIT PF IM SUSP
0.5000 mL | INTRAMUSCULAR | Status: AC
  Filled 2012-11-10: qty 0.5

## 2012-11-10 MED ORDER — CHLORHEXIDINE GLUCONATE 0.12 % MT SOLN
15.0000 mL | Freq: Two times a day (BID) | OROMUCOSAL | Status: DC
  Administered 2012-11-10 – 2012-11-11 (×3): 15 mL via OROMUCOSAL
  Filled 2012-11-10 (×3): qty 15

## 2012-11-10 MED ORDER — PANTOPRAZOLE SODIUM 40 MG IV SOLR
40.0000 mg | Freq: Every day | INTRAVENOUS | Status: DC
  Administered 2012-11-10 – 2012-11-11 (×2): 40 mg via INTRAVENOUS
  Filled 2012-11-10 (×4): qty 40

## 2012-11-10 MED ORDER — DARBEPOETIN ALFA-POLYSORBATE 25 MCG/0.42ML IJ SOLN
25.0000 ug | INTRAMUSCULAR | Status: DC
  Administered 2012-11-11: 25 ug via INTRAVENOUS
  Filled 2012-11-10: qty 0.42

## 2012-11-10 MED ORDER — SODIUM CHLORIDE 0.9 % IV SOLN
62.5000 mg | INTRAVENOUS | Status: DC
  Administered 2012-11-11 – 2012-11-13 (×2): 62.5 mg via INTRAVENOUS
  Filled 2012-11-10 (×3): qty 5

## 2012-11-10 MED ORDER — FENTANYL BOLUS VIA INFUSION
25.0000 ug | Freq: Four times a day (QID) | INTRAVENOUS | Status: DC | PRN
  Administered 2012-11-10 (×2): 50 ug via INTRAVENOUS
  Filled 2012-11-10: qty 100

## 2012-11-10 NOTE — Plan of Care (Signed)
Problem: Phase I Progression Outcomes Goal: Voiding-avoid urinary catheter unless indicated Outcome: Not Applicable Date Met:  [id number] Pt anuric
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
## 2012-11-10 NOTE — ED Notes (Signed)
Family at bedside. Husband says Pt. Has been HD pt. For 40yrs. States she goes on M-W-F.
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
## 2012-11-10 NOTE — Progress Notes (Signed)
Patient ID: Katie Lyons, female   DOB: [birth year], 27 y.o.   MRN: [id number] Patient has a better mental status this morning. She opens her eyes to stimulus. She follows commands. Pupils are 2 mm and reactive bilaterally. SHe seems to move all 4 extremities though she may move her right side with a little more strength. Pleased with progress. Okay to wean the ventilator. Repeat head CT tomorrow.

## 2012-11-10 NOTE — ED Notes (Signed)
No urine return when foley was put in.
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
## 2012-11-11 ENCOUNTER — Inpatient Hospital Stay (HOSPITAL_COMMUNITY): Payer: Medicaid Other

## 2012-11-11 ENCOUNTER — Encounter (HOSPITAL_COMMUNITY): Payer: Self-pay | Admitting: Radiology

## 2012-11-11 DIAGNOSIS — S0636AA Traumatic hemorrhage of cerebrum, unspecified, with loss of consciousness status unknown, initial encounter: Secondary | ICD-10-CM

## 2012-11-11 DIAGNOSIS — J9600 Acute respiratory failure, unspecified whether with hypoxia or hypercapnia: Secondary | ICD-10-CM | POA: Diagnosis present

## 2012-11-11 DIAGNOSIS — S1191XA Laceration without foreign body of unspecified part of neck, initial encounter: Secondary | ICD-10-CM

## 2012-11-11 DIAGNOSIS — J95821 Acute postprocedural respiratory failure: Secondary | ICD-10-CM

## 2012-11-11 DIAGNOSIS — S0101XA Laceration without foreign body of scalp, initial encounter: Secondary | ICD-10-CM

## 2012-11-11 DIAGNOSIS — S06369A Traumatic hemorrhage of cerebrum, unspecified, with loss of consciousness of unspecified duration, initial encounter: Secondary | ICD-10-CM

## 2012-11-11 LAB — RENAL FUNCTION PANEL
Albumin: 2.7 g/dL — ABNORMAL LOW (ref 3.5–5.2)
BUN: 69 mg/dL — ABNORMAL HIGH (ref 6–23)
Creatinine, Ser: 11.98 mg/dL — ABNORMAL HIGH (ref 0.50–1.10)
Phosphorus: 8.3 mg/dL — ABNORMAL HIGH (ref 2.3–4.6)

## 2012-11-11 LAB — CBC
HCT: 17.2 % — ABNORMAL LOW (ref 36.0–46.0)
MCHC: 35.5 g/dL (ref 30.0–36.0)
MCV: 86 fL (ref 78.0–100.0)
RDW: 16.9 % — ABNORMAL HIGH (ref 11.5–15.5)

## 2012-11-11 MED ORDER — IPRATROPIUM-ALBUTEROL 20-100 MCG/ACT IN AERS
2.0000 | INHALATION_SPRAY | Freq: Four times a day (QID) | RESPIRATORY_TRACT | Status: DC | PRN
  Filled 2012-11-11: qty 4

## 2012-11-11 MED ORDER — DARBEPOETIN ALFA-POLYSORBATE 25 MCG/0.42ML IJ SOLN
INTRAMUSCULAR | Status: AC
  Filled 2012-11-11: qty 0.42

## 2012-11-11 MED ORDER — DOXERCALCIFEROL 4 MCG/2ML IV SOLN
3.0000 ug | INTRAVENOUS | Status: DC
  Administered 2012-11-11 – 2012-11-15 (×2): 3 ug via INTRAVENOUS
  Filled 2012-11-11 (×3): qty 2

## 2012-11-11 MED ORDER — SODIUM CHLORIDE 0.9 % IV SOLN: 100.0000 mL | INTRAVENOUS | Status: DC | PRN

## 2012-11-11 MED ORDER — FENTANYL CITRATE 0.05 MG/ML IJ SOLN
25.0000 ug | INTRAMUSCULAR | Status: DC | PRN
  Administered 2012-11-11 (×2): 25 ug via INTRAVENOUS
  Administered 2012-11-11 – 2012-11-13 (×8): 50 ug via INTRAVENOUS
  Filled 2012-11-11 (×10): qty 2

## 2012-11-11 MED ORDER — CEFAZOLIN SODIUM-DEXTROSE 2-3 GM-% IV SOLR
2.0000 g | INTRAVENOUS | Status: DC
  Administered 2012-11-11: 2 g via INTRAVENOUS
  Filled 2012-11-11: qty 50

## 2012-11-11 MED ORDER — PENTAFLUOROPROP-TETRAFLUOROETH EX AERO: 1.0000 "application " | INHALATION_SPRAY | CUTANEOUS | Status: DC | PRN

## 2012-11-11 MED ORDER — DOXERCALCIFEROL 4 MCG/2ML IV SOLN
INTRAVENOUS | Status: AC
  Filled 2012-11-11: qty 2

## 2012-11-11 MED ORDER — LIDOCAINE HCL (PF) 1 % IJ SOLN: 5.0000 mL | INTRAMUSCULAR | Status: DC | PRN

## 2012-11-11 MED ORDER — LIDOCAINE-PRILOCAINE 2.5-2.5 % EX CREA: 1.0000 "application " | TOPICAL_CREAM | CUTANEOUS | Status: DC | PRN

## 2012-11-11 MED ORDER — NEPRO/CARBSTEADY PO LIQD
237.0000 mL | ORAL | Status: DC | PRN
  Filled 2012-11-11: qty 237

## 2012-11-11 MED ORDER — HEPARIN SODIUM (PORCINE) 1000 UNIT/ML DIALYSIS: 1000.0000 [IU] | INTRAMUSCULAR | Status: DC | PRN
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
## 2012-11-11 NOTE — Progress Notes (Signed)
CRITICAL VALUE ALERT  Critical value received:  Hemoglobin 6.1  Date of notification:  11/11/12  Time of notification:  13:55  Critical value read back:yes  Nurse who received alert:  Loreen Freud, RN  MD notified (1st page):  [place]  Time of first page:  13:56  MD notified (2nd page): N/A  Time of second page:  N/A  Responding MD:  Mercy Moore  Time MD responded:  13:58, transfusion orders

## 2012-11-11 NOTE — Procedures (Signed)
Pt seen on HD.   AP 130  Vp 210.  Now extubated.  Follows commands but is non verbal

## 2012-11-11 NOTE — Procedures (Signed)
Extubation Procedure Note  Patient Details:   Name: Katie Lyons DOB: 1985/10/01 MRN: [id number]   Airway Documentation:     Evaluation  O2 sats: stable throughout Complications: No apparent complications Patient did tolerate procedure well. Bilateral Breath Sounds: Clear Suctioning: Airway Yes, Pt extubated per MD. Pt placed on 4lpm [place], tolerating wel  Cordella Register 11/11/2012, 8:54 AM

## 2012-11-11 NOTE — Progress Notes (Addendum)
PT Cancellation Note  Patient Details Name: Jeann Ince MRN: [id number] DOB: [birth year]   Cancelled Treatment:    Reason Eval/Treat Not Completed: Patient at procedure or test/unavailable (currently recieving HD at bedside).  Hgb also 6.1 which is below our range to treat.  PT to check back tomorrow.    Barbarann Ehlers [facility], [place], DPT [phone]   11/11/2012, 2:04 PM

## 2012-11-11 NOTE — Progress Notes (Signed)
SLP Cancellation Note  Patient Details Name: Katie Lyons MRN: [id number] DOB: [birth year]   Cancelled treatment:       Reason Eval/Treat Not Completed: Medical issues which prohibited therapy (Pt on HD. Will f/u 4/1 am. )  Gabriel Rainwater Leslie, CCC-SLP [phone]    Katie Lyons 11/11/2012, 2:00 PM

## 2012-11-11 NOTE — Progress Notes (Signed)
Returning from Pt Transport to [facility].  No complications noted.  Pt remained stable throughout transport and is now situated back in room.
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
## 2012-11-11 NOTE — Progress Notes (Signed)
Hemodialysis treatment complete.  Pt tolerated well without adverse events.  Vital signs currently stable and pt is without complaint.  Report given to Loreen Freud, RN.

## 2012-11-12 LAB — TYPE AND SCREEN: Unit division: 0

## 2012-11-12 LAB — BASIC METABOLIC PANEL
BUN: 23 mg/dL (ref 6–23)
Chloride: 97 mEq/L (ref 96–112)
Creatinine, Ser: 5.82 mg/dL — ABNORMAL HIGH (ref 0.50–1.10)
GFR calc Af Amer: 10 mL/min — ABNORMAL LOW (ref >90)
Glucose, Bld: 102 mg/dL — ABNORMAL HIGH (ref 70–99)

## 2012-11-12 LAB — CBC
HCT: 28.8 % — ABNORMAL LOW (ref 36.0–46.0)
Hemoglobin: 9.8 g/dL — ABNORMAL LOW (ref 12.0–15.0)
MCHC: 34 g/dL (ref 30.0–36.0)
MCV: 84.7 fL (ref 78.0–100.0)
RDW: 16.2 % — ABNORMAL HIGH (ref 11.5–15.5)
WBC: 13 10*3/uL — ABNORMAL HIGH (ref 4.0–10.5)

## 2012-11-12 MED ORDER — RENA-VITE PO TABS
1.0000 | ORAL_TABLET | Freq: Every day | ORAL | Status: DC
  Filled 2012-11-12: qty 1

## 2012-11-12 MED ORDER — RENA-VITE PO TABS
1.0000 | ORAL_TABLET | Freq: Every day | ORAL | Status: DC
  Administered 2012-11-13 – 2012-11-14 (×2): 1 via ORAL
  Filled 2012-11-12 (×4): qty 1

## 2012-11-12 MED ORDER — CARVEDILOL 25 MG PO TABS
25.0000 mg | ORAL_TABLET | Freq: Two times a day (BID) | ORAL | Status: DC
  Administered 2012-11-12 – 2012-11-14 (×4): 25 mg via ORAL
  Filled 2012-11-12 (×8): qty 1

## 2012-11-12 MED ORDER — AMLODIPINE BESYLATE 10 MG PO TABS
10.0000 mg | ORAL_TABLET | Freq: Every day | ORAL | Status: DC
  Administered 2012-11-12 – 2012-11-14 (×2): 10 mg via ORAL
  Filled 2012-11-12 (×4): qty 1
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
## 2012-11-12 NOTE — Progress Notes (Addendum)
Patient ID: Cathrin Ermel, female   DOB: 1985/12/21, 27 y.o.   MRN: [id number]    Subjective: C/O HA, denies other complaint  Objective: Vital signs in last 24 hours: Temp:  [98.7 F (37.1 C)-99.1 F (37.3 C)] 98.8 F (37.1 C) (04/01 0419) Pulse Rate:  [77-116] 83 (04/01 0700) Resp:  [11-31] 19 (04/01 0700) BP: (144-171)/(70-106) 160/88 mmHg (04/01 0700) SpO2:  [97 %-100 %] 100 % (04/01 0700) FiO2 (%):  [29 %-30 %] 29 % (03/31 0850) Weight:  [47.2 kg (104 lb 0.9 oz)-50 kg (110 lb 3.7 oz)] 47.2 kg (104 lb 0.9 oz) (03/31 1708)    Intake/Output from previous day: 03/31 0701 - 04/01 0700 In: 697.5 [I.V.:97.5; Blood:600] Out: 3038  Intake/Output this shift:    General appearance: alert and cooperative Head: ace dressing Neck: no posterior midline tenderness, no pain with AROM - collar D/Cd Resp: clear to auscultation bilaterally Cardio: regular rate and rhythm GI: soft, NT, ND Extremities: calves soft Neuro: PERL 29mm, MAE, F/C and speaks well in Spanish Neck lac anterior CDI with staples  Lab Results: CBC   Recent Labs  11/11/12 1338 11/12/12 0420  WBC 13.5* 13.0*  HGB 6.1* 9.8*  HCT 17.2* 28.8*  PLT 129* 140*   BMET  Recent Labs  11/11/12 1338 11/12/12 0420  NA 137 140  K 5.9* 4.2  CL 100 97  CO2 20 29  GLUCOSE 99 102*  BUN 69* 23  CREATININE 11.98* 5.82*  CALCIUM 8.5 8.8   PT/INR  Recent Labs  11/09/12 2218  LABPROT 13.6  INR 1.05   ABG  Recent Labs  11/09/12 2314 11/10/12 0425  PHART 7.581* 7.458*  HCO3 29.1* 20.8    Studies/Results: Ct Head Without Contrast  11/11/2012  *RADIOLOGY REPORT*  Clinical Data: Follow-up intracranial hemorrhage.  CT HEAD WITHOUT CONTRAST  Technique:  Contiguous axial images were obtained from the base of the skull through the vertex without contrast.  Comparison: 11/09/2012  Findings: Persistent intraparenchymal hematomas in the right anterior frontal and anterior temporal regions.  The anterior  frontal hematoma measures about 4 x 1.4 cm, similar to previous study.  The anterior temporal hemorrhage measures about 2.1 x 1 cm, similar to previous study.  Focal small parenchymal hemorrhages in the basal ganglia are stable.  Minimal right to left midline shift again demonstrated.  Sulci effacement is present on the right.  No developing abnormal extra-axial fluid collection or hemorrhage. Gray-white matter junctions remain distinct.  There is interval development of a large subcutaneous scalp hematoma over the vertex anteriorly and mostly on the left.  Paranasal sinuses are clear.  IMPRESSION: Stable appearance of intracranial contents since previous study. Right frontal and anterior temporal intraparenchymal hematomas and small focal hematomas in the basal ganglia bilaterally appears stable.  Developing subcutaneous scalp hematoma over the left vertex anteriorly.   Original Report Authenticated By: Lucienne Capers, M.D.     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 Ronnald Ramp following, MS improved and  F/C well, TBI teams Resp - stable on RA Scalp lac Anterior neck lac C-spine - cleared ESRD - appreciate renal F/U, restarting some home meds FEN - swallow eval P with TBI team, allow meds PO VTE - PAS (TBI) ID - D/C ancef Dispo - ? To floor if OK with NS  LOS: 3 days    Georganna Skeans, MD, MPH, FACS Pager: [phone]  11/12/2012
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
## 2012-11-12 NOTE — Progress Notes (Signed)
Chaplain visited pt while rounding in [place]. Weekend chaplain saw pt in ED and encouraged me to follow-up. Pt was unresponsive. I attempted to offer support to two visitors - sisters of pt? - but then realized they could not speak [place]. Chaplain offered prayer for pt. Chaplain intends to come again when pt's husband is present whom I understand does speak [place].

## 2012-11-12 NOTE — Progress Notes (Signed)
S: extubated.  No SOB.  CO headache O:BP 160/88  Pulse 83  Temp(Src) 98.8 F (37.1 C) (Oral)  Resp 19  Ht 5' (1.524 m)  Wt 47.2 kg (104 lb 0.9 oz)  BMI 20.32 kg/m2  SpO2 100%  Intake/Output Summary (Last 24 hours) at 11/12/12 0754 Last data filed at 11/11/12 1708  Gross per 24 hour  Intake  697.5 ml  Output   3038 ml  Net -2340.5 ml   Weight change: -0.2 kg (-7.1 oz) [id number] and alert CVS:RRR Resp:clear anteriorly Abd:+ BS NTND Ext:No edema.  + bruit Rt AVG NEURO:follows commands and answers questions   . [START ON 11/13/2012]  ceFAZolin (ANCEF) IV  2 g Intravenous Q M,W,F-HD  . darbepoetin (ARANESP) injection - DIALYSIS  25 mcg Intravenous Q Mon-HD  . doxercalciferol  3 mcg Intravenous Q M,W,F-HD  . [START ON 11/13/2012] ferric gluconate (FERRLECIT/NULECIT) IV  62.5 mg Intravenous Weekly  . influenza  inactive virus vaccine  0.5 mL Intramuscular Tomorrow-1000  . pantoprazole  40 mg Oral Daily   Or  . pantoprazole (PROTONIX) IV  40 mg Intravenous Daily   Ct Head Without Contrast  11/11/2012  *RADIOLOGY REPORT*  Clinical Data: Follow-up intracranial hemorrhage.  CT HEAD WITHOUT CONTRAST  Technique:  Contiguous axial images were obtained from the base of the skull through the vertex without contrast.  Comparison: 11/09/2012  Findings: Persistent intraparenchymal hematomas in the right anterior frontal and anterior temporal regions.  The anterior frontal hematoma measures about 4 x 1.4 cm, similar to previous study.  The anterior temporal hemorrhage measures about 2.1 x 1 cm, similar to previous study.  Focal small parenchymal hemorrhages in the basal ganglia are stable.  Minimal right to left midline shift again demonstrated.  Sulci effacement is present on the right.  No developing abnormal extra-axial fluid collection or hemorrhage. Gray-white matter junctions remain distinct.  There is interval development of a large subcutaneous scalp hematoma over the vertex anteriorly and  mostly on the left.  Paranasal sinuses are clear.  IMPRESSION: Stable appearance of intracranial contents since previous study. Right frontal and anterior temporal intraparenchymal hematomas and small focal hematomas in the basal ganglia bilaterally appears stable.  Developing subcutaneous scalp hematoma over the left vertex anteriorly.   Original Report Authenticated By: Lucienne Capers, M.D.    BMET    Component Value Date/Time   NA 140 11/12/2012 0420   K 4.2 11/12/2012 0420   CL 97 11/12/2012 0420   CO2 29 11/12/2012 0420   GLUCOSE 102* 11/12/2012 0420   BUN 23 11/12/2012 0420   CREATININE 5.82* 11/12/2012 0420   CALCIUM 8.8 11/12/2012 0420   GFRNONAA 9* 11/12/2012 0420   GFRAA 10* 11/12/2012 0420   CBC    Component Value Date/Time   WBC 13.0* 11/12/2012 0420   RBC 3.40* 11/12/2012 0420   HGB 9.8* 11/12/2012 0420   HCT 28.8* 11/12/2012 0420   PLT 140* 11/12/2012 0420   MCV 84.7 11/12/2012 0420   MCH 28.8 11/12/2012 0420   MCHC 34.0 11/12/2012 0420   RDW 16.2* 11/12/2012 0420   LYMPHSABS 3.4 11/09/2012 2347   MONOABS 1.4* 11/09/2012 2347   EOSABS 0.3 11/09/2012 2347   BASOSABS 0.0 11/09/2012 2347     Assessment: 1. Sp closed head injury  2. HTN 3. Anemia 4. Sec HPTH 5. ESRD  Plan: 1. HD in AM 2. Resume BP meds  3. Resume rena vite [place] T
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
## 2012-11-12 NOTE — Progress Notes (Signed)
[facility] REC[facility]ERY Rancho Levels I-VI of Cognitive Functioning-Daily Tracking Sheet         Date of Onset ____3/29/14_____  Level of function Behavioral Characteristics Initial Eval.  Date: __4/1__ Date and initial when observed   Level I No response Total Assistance Complete absence of observable change in behavior when presented visual, auditory, tactile, proprioceptive, vestibular or painful stimuli.             Level II Generalized response  Total Assistance Demonstrates generalized reflex response to painful stimuli       Responds to repeated auditory stimuli with increased or decreased activity       Responds to external stimuli with physiological changes generalized, gross body movement and / or not purposeful vocalization               Responses noted above may be same regardless of type and location of stimuli        Responses may be significantly delayed      Level III Localized response  Total Assistance Demonstrates withdrawal or vocalization to painful stimuli       Turns toward or away from auditory stimuli        Blinks when strong light crosses visual field        Follows moving object passed within visual field        Responds to discomfort by pulling tubes or restraints       Responds inconsistently to simple commands       Responses directly related to type of stimulus       May respond to some persons (especially friends and family) but not to others       Level IV Confused/Agitated  Maximal Assistance       Alert and in heightened state of anxiety        Purposeful attempts to remove restraints or tubes or crawl out of bed        May perform motor activities such as sitting, reaching and walking without any apparent purpose or upon another's request        Brief and usually non purposeful moments of focused and sustained attention       Post traumatic amnesia state        Absent goal directed, problem solving, self  monitoring behavior       May cry or scream out of proportion to stimulus even after it's removal       May exhibit aggressive or flight behavior        Mood swing from euphoric to hostile with no apparent relationship to environmental events        Verbalizations are frequently incoherent and/or inappropriate to activity or environment             Level V   Confused/InappropriateNon-Agitated  Maximal Assistance Alert, not agitated but may wander randomly or with vague intention of going home        May become agitated in response to external stimulation and/or lack of environmental structure.       Not orientated to person, place and time. MR 4/1      Frequent brief periods, non-purposeful sustained attention.       Severely impaired recent memory, with confusion of past and present in reaction to ongoing activity.        Absent goal directed, problem solving behavior.        Often demonstrates inappropriate use of objects without external direction.  May be able to perform previously learned tasks when structure and cues provided. MR 4/1      Unable to learn new information       Able to respond appropriately to simple commands fairly consistently with external structure and cues. MR 4/1      Responses to simple commands without external structure are random and non-purposeful in relation to the command       Able to converse on a social, automatic level for brief periods of time when provided external structure and cues.  MR 4/1      Verbalizations about present events become inappropriate and confabulatory when external structure and cues are not provided.                Level of function Level VI Confused/Appropriate  Maximal Assistance Behavioral Characteristics Initial Eval.  Date: _____ Date and initial when observed    Able to attend to highly familiar tasks in non-distracting environment for 30 minutes with moderate redirection.        Remote memory has more depth and detail than recent memory  MR 4/1      Vague recognition of some staff. MR 4/1      Able to use assistive memory aide with maximal assist.       Emerging awareness of appropriate response to self, family and basic needs.        Emerging goal directed behavior.        Moderate assist to problem solve barriers to task completion.        Supervised for old learning (e.g. self care).       Shows carry over for relearned familiar tasks (e.g. self care).       Maximal assistance for new learning with little or no carry over.       Unaware of impairments, disabilities and safety risks. MR 4/1      Consistently follows simple directions MR 4/1      Verbal expressions are appropriate in highly familiar and structured situations. MR 4/1      DO  NOT SIGN NOTE. ALWAYS SHARE UNLESS PATIENT HAS PROGRESSED BEYOND A RANCHO LEVEL VI. THIS WAY, ALL TEAM MEMBERS HAVE ACCESS TO DOCUMENT ON TRACKING SHEET. THANK YOU.

## 2012-11-12 NOTE — Progress Notes (Signed)
Patient ID: Katie Lyons, female   DOB: 1986-05-02, 27 y.o.   MRN: [id number] Doing well.. awake and alert and following commands. Ambulating to the bathroom. Okay to transfer to floor.
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
## 2012-11-13 LAB — CBC
MCH: 29.8 pg (ref 26.0–34.0)
MCHC: 35 g/dL (ref 30.0–36.0)
MCV: 85.2 fL (ref 78.0–100.0)
Platelets: 149 10*3/uL — ABNORMAL LOW (ref 150–400)
RDW: 15.7 % — ABNORMAL HIGH (ref 11.5–15.5)

## 2012-11-13 LAB — BASIC METABOLIC PANEL
CO2: 27 mEq/L (ref 19–32)
Calcium: 9 mg/dL (ref 8.4–10.5)
Creatinine, Ser: 9.37 mg/dL — ABNORMAL HIGH (ref 0.50–1.10)
GFR calc Af Amer: 6 mL/min — ABNORMAL LOW (ref >90)
GFR calc non Af Amer: 5 mL/min — ABNORMAL LOW (ref >90)

## 2012-11-13 MED ORDER — OXYCODONE-ACETAMINOPHEN 5-325 MG PO TABS
1.0000 | ORAL_TABLET | ORAL | Status: DC | PRN
  Administered 2012-11-13 (×2): 1 via ORAL
  Administered 2012-11-14 (×2): 2 via ORAL
  Administered 2012-11-14 – 2012-11-15 (×2): 1 via ORAL
  Administered 2012-11-15: 2 via ORAL
  Filled 2012-11-13: qty 1
  Filled 2012-11-13 (×2): qty 2
  Filled 2012-11-13 (×2): qty 1
  Filled 2012-11-13 (×2): qty 2
  Filled 2012-11-13: qty 1

## 2012-11-13 MED ORDER — DOXERCALCIFEROL 4 MCG/2ML IV SOLN
INTRAVENOUS | Status: AC
  Administered 2012-11-13: 3 ug via INTRAVENOUS
  Filled 2012-11-13: qty 2

## 2012-11-13 MED ORDER — OXYCODONE-ACETAMINOPHEN 5-325 MG PO TABS
ORAL_TABLET | ORAL | Status: AC
  Administered 2012-11-13: 1 via ORAL
  Filled 2012-11-13: qty 1

## 2012-11-13 NOTE — Procedures (Signed)
Pt seen on HD.   Ap 100 Vp 210.  SBP 152.  Tolerating HD well so far.
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
## 2012-11-13 NOTE — Progress Notes (Signed)
Occupational Therapy Treatment Patient Details Name: Katie Lyons MRN: [id number] DOB: May 16, 1986 Today's Date: 11/13/2012 Time: [id number] OT Time Calculation (min): 16 min  OT Assessment / Plan / Recommendation Comments on Treatment Session Pt remains with blurred vision with new onset 11/12/12. Pt ambulating with therapy and demostrates light sensitivity. Pt progressing and remains with severe pain in mouth/ head. Pt answering questions and responding promptly to translators questions    Follow Up Recommendations  Home health OT    Barriers to Discharge       Equipment Recommendations  None recommended by OT    Recommendations for Other Services    Frequency Min 3X/week   Plan Discharge plan remains appropriate    Precautions / Restrictions Precautions Precautions: Fall   Pertinent Vitals/Pain 10/ 10 pain in mouth /head    ADL  Eating/Feeding: Modified independent Where Assessed - Eating/Feeding: Edge of bed (unsupported) Grooming: Teeth care;Wash/dry face;Supervision/safety Where Assessed - Grooming: Unsupported standing Toilet Transfer: Minimal assistance Toilet Transfer Method: Sit to stand Toilet Transfer Equipment: Regular height toilet Toileting - Clothing Manipulation and Hygiene: Min guard Where Assessed - Toileting Clothing Manipulation and Hygiene: Sit to stand from 3-in-1 or toilet Equipment Used: Gait belt Transfers/Ambulation Related to ADLs: Pt ambulating with HHA and unsteady gait. pt progressing and improved from 11/12/12. ADL Comments: Pt with eyes closed 70 % of session reporting sensitive to the light in the room. Pt remains with blurry vision. pt started blurry vision on 11/12/12 per husband and states "she did not have this monday". Pt with swollen eye lids remain. Pt recalled previous session. pt demonstrates cognitive deficits of higher executive functioning level. Pt encouraged to use gaze stabilization to (A) with dizziness. Pt remains with pain  in mouth and head. Pt complete bed mobility, problem solved the sink foot pedals without cueing and ambulated in the hallway. Pt demonstrates fine motor to hold green swab. Pt  used a spoon visually attending to apple sauce and eating half a cup of apple sauce with encouragement from SLP leah    OT Diagnosis:    OT Problem List:   OT Treatment Interventions:     OT Goals Acute Rehab OT Goals OT Goal Formulation: With patient Time For Goal Achievement: 11/26/12 Potential to Achieve Goals: Good ADL Goals Pt Will Perform Grooming: with modified independence;Standing at sink ADL Goal: Grooming - Progress: Progressing toward goals Pt Will Perform Upper Body Bathing: with modified independence;Sitting, chair;Sitting, edge of bed;Unsupported ADL Goal: Upper Body Bathing - Progress: Progressing toward goals Pt Will Perform Lower Body Bathing: with min assist;Sit to stand from chair Pt Will Perform Upper Body Dressing: with modified independence;Sit to stand from chair ADL Goal: Upper Body Dressing - Progress: Progressing toward goals Pt Will Perform Lower Body Dressing: with min assist;Sit to stand from bed;Sit to stand from chair;Unsupported Pt Will Transfer to Toilet: with supervision;Ambulation;Regular height toilet ADL Goal: Toilet Transfer - Progress: Progressing toward goals Pt Will Perform Toileting - Hygiene: with supervision;Sit to stand from 3-in-1/toilet Miscellaneous OT Goals Miscellaneous OT Goal #1: Pt will demonstrate  ability to stop one task and start a different task and return to first task Supervision level (demonstrating incr attention level for adls)  Visit Information  Last OT Received On: 11/13/12 Assistance Needed: +1    Subjective Data      Prior Functioning       Cognition  Cognition Overall Cognitive Status: Impaired Area of Impairment: Attention;Memory;Rancho level Arousal/Alertness: Awake/alert Behavior During Session: Flat affect Current Attention  Level: Selective Attention - Other Comments: pt initiates and sustains task for ~1 minutes now [employer] of Cognitive Functioning Rancho [employer] Scales of Cognitive Functioning: Automatic/appropriate    Mobility  Bed Mobility Bed Mobility: Supine to Sit;Sitting - Scoot to Edge of Bed;Sit to Supine Supine to Sit: 5: Supervision Sitting - Scoot to Edge of Bed: 5: Supervision Sit to Supine: 5: Supervision Details for Bed Mobility Assistance: required incr time and stopping several times holding head. Pt needed v/c to visually attend to object for gaze stabilization Transfers Sit to Stand: 4: Min guard Stand to Sit: 4: Min guard Details for Transfer Assistance: using bil UE on bed surface.    Exercises      Balance Static Sitting Balance Static Sitting - Balance Support: Feet supported Static Sitting - Level of Assistance: 5: Stand by assistance High Level Balance High Level Balance Comments: Pt static standing on Lt LE to lift Rt LE and push sink water. pt without LOB   End of Session OT - End of Session Activity Tolerance: Patient limited by fatigue;Patient limited by pain Patient left: in bed;with call bell/phone within reach;with family/visitor present Nurse Communication: Mobility status;Precautions  GO     Veneda Melter 11/13/2012, 2:02 PM Pager: [phone]
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
## 2012-11-13 NOTE — Progress Notes (Signed)
Trauma Service Note  Subjective: Patient non-verbal.  Keeps eyes closed.  No room available on 4N.  Objective: Vital signs in last 24 hours: Temp:  [97.9 F (36.6 C)-99.6 F (37.6 C)] 97.9 F (36.6 C) (04/02 0700) Pulse Rate:  [73-98] 75 (04/02 0800) Resp:  [14-27] 17 (04/02 0800) BP: (129-164)/(73-93) 153/89 mmHg (04/02 0800) SpO2:  [97 %-100 %] 100 % (04/02 0800) Last BM Date: 11/12/12  Intake/Output from previous day:   Intake/Output this shift:    General: No acute distress.  Lungs: Clear to auscultation.  Abd: soft, non-tender  Extremities: No deformities  Neuro: Intact.  Still with significant disabilities.  Lab Results: CBC   Recent Labs  11/12/12 0420 11/13/12 0405  WBC 13.0* 9.1  HGB 9.8* 9.9*  HCT 28.8* 28.3*  PLT 140* 149*   BMET  Recent Labs  11/12/12 0420 11/13/12 0405  NA 140 136  K 4.2 4.2  CL 97 94*  CO2 29 27  GLUCOSE 102* 90  BUN 23 42*  CREATININE 5.82* 9.37*  CALCIUM 8.8 9.0   PT/INR No results found for this basename: LABPROT, INR,  in the last 72 hours ABG No results found for this basename: PHART, PCO2, PO2, HCO3,  in the last 72 hours  Studies/Results: No results found.  Anti-infectives: Anti-infectives   Start     Dose/Rate Route Frequency Ordered Stop   11/13/12 1200  ceFAZolin (ANCEF) IVPB 2 g/50 mL premix  Status:  Discontinued     2 g 100 mL/hr over 30 Minutes Intravenous Every M-W-F (Hemodialysis) 11/11/12 1041 11/12/12 0813   11/10/12 0600  ceFAZolin (ANCEF) IVPB 1 g/50 mL premix  Status:  Discontinued     1 g 100 mL/hr over 30 Minutes Intravenous 3 times per day 11/10/12 0238 11/11/12 1038      Assessment/Plan: s/p  Transfer to the floor. PT/OT/ST Possible CIR versus SNF placement  LOS: 4 days   Kathryne Eriksson. Dahlia Bailiff, MD, FACS [phone] Trauma Surgeon 11/13/2012

## 2012-11-14 ENCOUNTER — Inpatient Hospital Stay (HOSPITAL_COMMUNITY): Payer: Medicaid Other

## 2012-11-14 MED ORDER — LANTHANUM CARBONATE 500 MG PO CHEW
1000.0000 mg | CHEWABLE_TABLET | Freq: Three times a day (TID) | ORAL | Status: DC
  Administered 2012-11-14 – 2012-11-15 (×2): 1000 mg via ORAL
  Filled 2012-11-14 (×6): qty 2

## 2012-11-14 MED ORDER — WHITE PETROLATUM GEL
Status: AC
  Administered 2012-11-15: 0.2
  Filled 2012-11-14: qty 5
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
## 2012-11-14 NOTE — Progress Notes (Signed)
S: .    CO headache but it is getting better O:BP 163/92  Pulse 63  Temp(Src) 98 F (36.7 C) (Oral)  Resp 18  Ht 5' (1.524 m)  Wt 44.1 kg (97 lb 3.6 oz)  BMI 18.99 kg/m2  SpO2 100%  Intake/Output Summary (Last 24 hours) at 11/14/12 0802 Last data filed at 11/13/12 1738  Gross per 24 hour  Intake      0 ml  Output   2989 ml  Net  -2989 ml   Weight change:  [id number] and alert CVS:RRR 2/6 systolic murmur Resp:clear  Abd:+ BS NTND Ext:No edema.  + bruit Rt AVG NEURO:follows commands and answers questions   . amLODipine  10 mg Oral Daily  . carvedilol  25 mg Oral BID WC  . darbepoetin (ARANESP) injection - DIALYSIS  25 mcg Intravenous Q Mon-HD  . doxercalciferol  3 mcg Intravenous Q M,W,F-HD  . ferric gluconate (FERRLECIT/NULECIT) IV  62.5 mg Intravenous Weekly  . multivitamin  1 tablet Oral QHS  . pantoprazole  40 mg Oral Daily   No results found. BMET    Component Value Date/Time   NA 136 11/13/2012 0405   K 4.2 11/13/2012 0405   CL 94* 11/13/2012 0405   CO2 27 11/13/2012 0405   GLUCOSE 90 11/13/2012 0405   BUN 42* 11/13/2012 0405   CREATININE 9.37* 11/13/2012 0405   CALCIUM 9.0 11/13/2012 0405   GFRNONAA 5* 11/13/2012 0405   GFRAA 6* 11/13/2012 0405   CBC    Component Value Date/Time   WBC 9.1 11/13/2012 0405   RBC 3.32* 11/13/2012 0405   HGB 9.9* 11/13/2012 0405   HCT 28.3* 11/13/2012 0405   PLT 149* 11/13/2012 0405   MCV 85.2 11/13/2012 0405   MCH 29.8 11/13/2012 0405   MCHC 35.0 11/13/2012 0405   RDW 15.7* 11/13/2012 0405   LYMPHSABS 3.4 11/09/2012 2347   MONOABS 1.4* 11/09/2012 2347   EOSABS 0.3 11/09/2012 2347   BASOSABS 0.0 11/09/2012 2347     Assessment: 1. Sp closed head injury  2. HTN 3. Anemia 4. Sec HPTH 5. ESRD  Plan: 1. HD tomorrow 2. Resume PO4 binder 3.  Follow BP for now.  If needed can resume lisinopril 20 mg q day  Anshi Jalloh T

## 2012-11-15 DIAGNOSIS — D62 Acute posthemorrhagic anemia: Secondary | ICD-10-CM | POA: Clinically undetermined

## 2012-11-15 DIAGNOSIS — N189 Chronic kidney disease, unspecified: Secondary | ICD-10-CM | POA: Diagnosis present

## 2012-11-15 DIAGNOSIS — D631 Anemia in chronic kidney disease: Secondary | ICD-10-CM | POA: Diagnosis present

## 2012-11-15 LAB — CBC
MCV: 86.4 fL (ref 78.0–100.0)
Platelets: 227 10*3/uL (ref 150–400)
RDW: 14.7 % (ref 11.5–15.5)
WBC: 8.8 10*3/uL (ref 4.0–10.5)

## 2012-11-15 LAB — RENAL FUNCTION PANEL
Albumin: 3.3 g/dL — ABNORMAL LOW (ref 3.5–5.2)
Chloride: 90 mEq/L — ABNORMAL LOW (ref 96–112)
Creatinine, Ser: 8.61 mg/dL — ABNORMAL HIGH (ref 0.50–1.10)
GFR calc non Af Amer: 6 mL/min — ABNORMAL LOW (ref >90)
Sodium: 133 mEq/L — ABNORMAL LOW (ref 135–145)

## 2012-11-15 MED ORDER — OXYCODONE-ACETAMINOPHEN 5-325 MG PO TABS: 1.0000 | ORAL_TABLET | ORAL | Status: DC | PRN
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
## 2012-11-15 NOTE — Progress Notes (Signed)
Speech Language Pathology Treatment Patient Details Name: Katie Lyons MRN: [id number] DOB: [birth year] Today's Date: 11/15/2012 Time: [id number] SLP Time Calculation (min): 50 min  Assessment / Plan / Recommendation Clinical Impression  Cognitive-linguistic treatment with focus on facilitation of cognitive skills during a functional ADL. Patient continues to exhibit deficits in the areas of alertness, attention, and initiation, requiring initial max cueing for increased alertness and to initiate basic tasks. Once fully aroused and engaged in familiar tasks, initiation improved, requiring moderate questioning cues. Sustained attention remains impaired affecting storage and retrieval of newly taught information; max verbal reminders provided. Patient with improving intellectual and emergent awareness however anticipatory awareness for needs after d/c remains significantly impaired, requiring max SLP cueing. Education complete with patient and spouse regarding current level of TBI and need for 24 hour supervision after d/c. Briefly observed patient consuming thin liquids without overt indication of swallowing difficulty. Reports resolving throat pain for increased ability to consume regular texture solids. Spouse reports no difficulty with pos. No further f/u for dysphagia indicated at this time.  Recommend Orrick SLP to f/u for cognitive recovery. Will continue to f/u in the acute care setting.     SLP Plan  Continue with current plan of care    Pertinent Vitals/Pain None reported  SLP Goals  SLP Goals Potential to Achieve Goals: Good Progress/Goals/Alternative treatment plan discussed with pt/caregiver and they: Agree SLP Goal #1: Patient will sustain attention to basic ADL for 15 minutes with supervision cues to redirect.  SLP Goal #1 - Progress: Progressing toward goal SLP Goal #2: Patient will demonstrate ability to utilize external memory aids to increase functional recall of daily  information with min cues.  SLP Goal #2 - Progress: Progressing toward goal SLP Goal #3: Patient will present with at least 3 behaviors of a [place]o Level VII (automatic, appropriate) over 3 consecutive sessions.  SLP Goal #3 - Progress: Progressing toward goal  General Temperature Spikes Noted: No Respiratory Status: Room air Behavior/Cognition: Requires cueing;Decreased sustained attention Oral Cavity - Dentition: Adequate natural dentition Patient Positioning: Upright in bed  GO   Gabriel Rainwater MA, CCC-SLP [phone]   Rondale Nies Meryl 11/15/2012, 2:47 PM

## 2012-11-15 NOTE — Progress Notes (Signed)
Stable after dialysis for discharge  This patient has been seen and I agree with the findings and treatment plan.  Kathryne Eriksson. Dahlia Bailiff, MD, [place] [phone] (pager) [phone] (direct pager) Trauma Surgeon

## 2012-11-15 NOTE — Progress Notes (Signed)
Pt up and showered with assistance.  Back to bed, resting.
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
## 2012-11-15 NOTE — Care Management Note (Signed)
  Page 1 of 1   11/15/2012     3:34:52 PM   CARE MANAGEMENT NOTE 11/15/2012  Patient:  Katie Lyons,Katie Lyons   Account Number:  [id number]  Date Initiated:  11/15/2012  Documentation initiated by:  Magdalen Spatz  Subjective/Objective Assessment:     Action/Plan:   Anticipated DC Date:  11/15/2012   Anticipated DC Plan:  [place]         Choice offered to / List presented to:  C-3 Spouse        HH arranged  [facility].   Status of service:  Completed, signed off Medicare Important Message given?   (If response is "NO", the following Medicare IM given date fields will be blank) Date Medicare IM given:   Date Additional Medicare IM given:    Discharge Disposition:    Per UR Regulation:  Reviewed for med. necessity/level of care/duration of stay  If discussed at Long Length of Stay Meetings, dates discussed:    Comments:  11-15-12 Confirmed facesheet information with patient's husband via Clarita Crane ( interpreter) , Husband's cell phone is [phone] .  Magdalen Spatz RN BSN [phone]
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
## 2012-11-15 NOTE — Progress Notes (Signed)
Occupational Therapy Treatment Patient Details Name: Katie Lyons MRN: [id number] DOB: 1986-03-14 Today's Date: 11/15/2012 Time: [id number] OT Time Calculation (min): 50 min  OT Assessment / Plan / Recommendation Comments on Treatment Session Pt progressing this session and completed shower. Pt remains with cognitive deficits and unsafe to care for herself at d/c. pt with poor hygiene and lack of awareness to deficits    Follow Up Recommendations  Home health OT    Barriers to Discharge       Equipment Recommendations  None recommended by OT    Recommendations for Other Services    Frequency Min 3X/week   Plan Discharge plan remains appropriate    Precautions / Restrictions Precautions Precautions: Fall Restrictions Weight Bearing Restrictions: No   Pertinent Vitals/Pain 6 out 10 HA    ADL  Grooming: Teeth care;Min guard Where Assessed - Grooming: Supported standing (cues to finish task - close tooth paste / decr attention ) Upper Body Bathing: Chest;Right arm;Left arm;Abdomen;Supervision/safety Where Assessed - Upper Body Bathing: Supported sitting Lower Body Bathing: Min guard Where Assessed - Lower Body Bathing: Unsupported sit to stand Upper Body Dressing: Supervision/safety Where Assessed - Upper Body Dressing: Supported standing Lower Body Dressing: Minimal assistance Where Assessed - Lower Body Dressing: Supported sit to Lobbyist: Supervision/safety Armed forces technical officer Method: Sit to Loss adjuster, chartered: Regular height toilet Toileting - Clothing Manipulation and Hygiene: Minimal assistance Where Assessed - Best boy and Hygiene: Sit to stand from 3-in-1 or toilet Tub/Shower Transfer: Min guard Tub/Shower Transfer Method: Therapist, art: Walk in shower Transfers/Ambulation Related to ADLs: Pt ambulates holding head and decr gait velocity. Pt covering eyes from any light ADL Comments:  PT completed bed mobility and oral care at sink level. pt cleared for shower. Pt provided shower. Pt reports to staff when asked that menstrual cycle has not started. However in fact patient has started menstrual cycle and unaware. pt with large volumes of dried blood from hair. Pt found to have gold oval shaped charm in hair. Pt completed shower with min to min guard (A) . pt unsteady iwth sit <>S tand . Pt with alternating attention during task and more attentive. Pt upon return to bed less attentive to questioning and education. Pt with poor recall of education. Pt husband present and educated on poor short term memory, need for 24 / 7 supervision, need for (A) with hygiene and assist for daughter care. Pt unaware of deficits affecting ability to care for child. Pt with poor insight to injuries. Pt much more alert this session with bathing task.    OT Diagnosis:    OT Problem List:   OT Treatment Interventions:     OT Goals Acute Rehab OT Goals OT Goal Formulation: With patient Time For Goal Achievement: 11/26/12 Potential to Achieve Goals: Good ADL Goals Pt Will Perform Grooming: with modified independence;Standing at sink ADL Goal: Grooming - Progress: Progressing toward goals Pt Will Perform Upper Body Bathing: with modified independence;Sitting, chair;Sitting, edge of bed;Unsupported ADL Goal: Upper Body Bathing - Progress: Progressing toward goals Pt Will Perform Lower Body Bathing: with min assist;Sit to stand from chair ADL Goal: Lower Body Bathing - Progress: Progressing toward goals Pt Will Perform Upper Body Dressing: with modified independence;Sit to stand from chair ADL Goal: Upper Body Dressing - Progress: Progressing toward goals Pt Will Perform Lower Body Dressing: with min assist;Sit to stand from bed;Sit to stand from chair;Unsupported ADL Goal: Lower Body Dressing - Progress: Progressing toward goals Pt  Will Transfer to Toilet: with supervision;Ambulation;Regular height  toilet ADL Goal: Toilet Transfer - Progress: Progressing toward goals Pt Will Perform Toileting - Hygiene: with supervision;Sit to stand from 3-in-1/toilet ADL Goal: Toileting - Hygiene - Progress: Progressing toward goals Miscellaneous OT Goals Miscellaneous OT Goal #1: Pt will demonstrate  ability to stop one task and start a different task and return to first task Supervision level (demonstrating incr attention level for adls) OT Goal: Miscellaneous Goal #1 - Progress: Met  Visit Information  Last OT Received On: 11/15/12 Assistance Needed: +1 PT/OT Co-Evaluation/Treatment: Yes    Subjective Data      Prior Functioning       Cognition  Cognition Overall Cognitive Status: Impaired Area of Impairment: Attention;Memory;Safety/judgement;Awareness of deficits;Problem solving Arousal/Alertness: Awake/alert Behavior During Session: Flat affect Current Attention Level: Selective Attention - Other Comments: pt able to sustain attention for > 24min on a desireable task such as during shower.   Following Commands: Follows multi-step commands inconsistently Safety/Judgement: Decreased safety judgement for tasks assessed;Decreased awareness of need for assistance Awareness of Deficits: pt unaware of cognitive and balance deficits Rancho Levels of Cognitive Functioning Ranch[employer] Scales of Cognitive Functioning: Automatic/appropriate    Mobility  Bed Mobility Bed Mobility: Supine to Sit;Sitting - Scoot to Edge of Bed;Sit to Supine Supine to Sit: 5: Supervision Sitting - Scoot to Edge of Bed: 5: Supervision Sit to Supine: 5: Supervision Details for Bed Mobility Assistance: close supervision for safety.  Increased time needed to complete task.   Transfers Transfers: Sit to Stand;Stand to Sit Sit to Stand: 4: Min guard;With upper extremity assist;From bed;From chair/3-in-1 Stand to Sit: 4: Min guard;With upper extremity assist;To chair/3-in-1;To bed Details for Transfer  Assistance: pt mildly unsteady, opening eyes a little more often.  Requires UE A for balance.      Exercises      Balance Balance Balance Assessed: Yes Static Standing Balance Static Standing - Balance Support: Right upper extremity supported;During functional activity Static Standing - Level of Assistance: 4: Min assist   End of Session OT - End of Session Activity Tolerance: Patient tolerated treatment well Patient left: in bed;with call bell/phone within reach;with family/visitor present Nurse Communication: Mobility status;Precautions  GO     Veneda Melter 11/15/2012, 3:34 PM  Pager: [phone]

## 2012-11-15 NOTE — Progress Notes (Signed)
Pt being discharged home.  Rx, and discharge instructions given to patient and patients family.  Interpeter and therapy reviewed limitations and arrangements for home health.

## 2012-11-15 NOTE — Progress Notes (Signed)
[facility]  Patient Status: New  [facility] is providing the following services: PT and OT  If patient discharges after hours, please call [phone].   Katie Lyons 11/15/2012, 3:33 PM

## 2012-11-15 NOTE — Progress Notes (Signed)
Interpreter Lesle Chris for Speech, OP, PT rehab Team

## 2012-11-15 NOTE — Procedures (Signed)
Pt seen on HD  Ap 100 Vp 210.  SBP 149.  HA has improved.  She is eating and up walking some.

## 2012-11-15 NOTE — Progress Notes (Signed)
Katie Lyons, [place] [phone] 11/15/2012
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
## 2012-11-15 NOTE — Progress Notes (Signed)
[facility]  Patient Status: New  [facility] is providing the following services: PT, OT, ST and HHA  If patient discharges after hours, please call [phone].   Katie Lyons 11/15/2012, 3:36 PM

## 2012-11-15 NOTE — Progress Notes (Signed)
Pt.  Off unit for dialysis.

## 2012-11-21 ENCOUNTER — Ambulatory Visit (INDEPENDENT_AMBULATORY_CARE_PROVIDER_SITE_OTHER): Payer: Self-pay

## 2012-11-21 ENCOUNTER — Encounter (INDEPENDENT_AMBULATORY_CARE_PROVIDER_SITE_OTHER): Payer: Self-pay

## 2012-11-21 VITALS — BP 140/86 | HR 64 | Temp 100.3°F | Resp 14 | Ht <= 58 in | Wt 103.8 lb

## 2012-11-21 DIAGNOSIS — Z4802 Encounter for removal of sutures: Secondary | ICD-10-CM
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
## 2012-11-25 ENCOUNTER — Encounter (INDEPENDENT_AMBULATORY_CARE_PROVIDER_SITE_OTHER): Payer: Self-pay | Admitting: General Surgery

## 2012-11-25 ENCOUNTER — Ambulatory Visit (INDEPENDENT_AMBULATORY_CARE_PROVIDER_SITE_OTHER): Payer: Self-pay | Admitting: General Surgery

## 2012-11-25 VITALS — BP 108/60 | HR 68 | Temp 98.1°F | Resp 16 | Ht 59.0 in | Wt 101.0 lb

## 2012-11-25 DIAGNOSIS — Z5189 Encounter for other specified aftercare: Secondary | ICD-10-CM

## 2012-11-25 DIAGNOSIS — T798XXD Other early complications of trauma, subsequent encounter: Secondary | ICD-10-CM

## 2012-11-25 DIAGNOSIS — L089 Local infection of the skin and subcutaneous tissue, unspecified: Secondary | ICD-10-CM | POA: Insufficient documentation

## 2012-11-25 NOTE — Progress Notes (Signed)
The patient's infection in her left posterior scalp is doing much better. Upon pressing upon it there is no pus. I attempted to lift the eschar and no pus was returned.  A placed on triple antibiotic ointment and the wound left open. She can clean it with soap and water pat it dry and in place triple antibiotic ointment and needed open. She'll need to be seen in 2 weeks in the trauma clinic.the patient was placed on clindamycin 4 days ago. She continued to take her antibiotics until they're gone. She should followup in 2 weeks in the trauma clinic.

## 2012-12-13 ENCOUNTER — Encounter (INDEPENDENT_AMBULATORY_CARE_PROVIDER_SITE_OTHER): Payer: Self-pay

## 2013-07-04 ENCOUNTER — Encounter (HOSPITAL_COMMUNITY): Payer: Self-pay | Admitting: Emergency Medicine

## 2013-07-04 ENCOUNTER — Emergency Department (HOSPITAL_COMMUNITY)
Admission: EM | Admit: 2013-07-04 | Discharge: 2013-07-04 | Disposition: A | Discharge status: Home / Self Care | Payer: Medicaid Other | Attending: Emergency Medicine | Admitting: Emergency Medicine

## 2013-07-04 DIAGNOSIS — I120 Hypertensive chronic kidney disease with stage 5 chronic kidney disease or end stage renal disease: Secondary | ICD-10-CM | POA: Insufficient documentation

## 2013-07-04 DIAGNOSIS — Z8659 Personal history of other mental and behavioral disorders: Secondary | ICD-10-CM | POA: Insufficient documentation

## 2013-07-04 DIAGNOSIS — Z992 Dependence on renal dialysis: Secondary | ICD-10-CM | POA: Insufficient documentation

## 2013-07-04 DIAGNOSIS — N186 End stage renal disease: Secondary | ICD-10-CM | POA: Insufficient documentation

## 2013-07-04 DIAGNOSIS — Z79899 Other long term (current) drug therapy: Secondary | ICD-10-CM | POA: Insufficient documentation

## 2013-07-04 DIAGNOSIS — T82598A Other mechanical complication of other cardiac and vascular devices and implants, initial encounter: Secondary | ICD-10-CM | POA: Insufficient documentation

## 2013-07-04 DIAGNOSIS — T82838A Hemorrhage of vascular prosthetic devices, implants and grafts, initial encounter: Secondary | ICD-10-CM

## 2013-07-04 DIAGNOSIS — Y832 Surgical operation with anastomosis, bypass or graft as the cause of abnormal reaction of the patient, or of later complication, without mention of misadventure at the time of the procedure: Secondary | ICD-10-CM | POA: Insufficient documentation

## 2013-07-04 HISTORY — DX: Essential (primary) hypertension: I10

## 2013-07-04 NOTE — ED Notes (Addendum)
Pt. reports bleeding at hemodialysis right upper thigh graft this evening , no bleeding at arrival ( dried blood at right thigh) , pressure dressing applied by triage nurse , hemodialysis q. Tues / Thurs / Sat . Spouse translating ( Spanish) for pt.

## 2013-07-04 NOTE — ED Notes (Signed)
Bleeding remains under control with pressure dressing.  Pt wants to go home.

## 2013-07-04 NOTE — ED Notes (Signed)
Pt presents with c/o spontaneous bleed from right thigh dialysis graft at home tonight. Dressing placed in triage remains dry and intact at present.  Will not disturb for now.

## 2013-07-05 NOTE — ED Provider Notes (Signed)
CSN: [id number]     Arrival date & time 07/04/13  0008 History   First MD Initiated Contact with Patient 07/04/13 0131     Chief Complaint  Patient presents with  . Vascular Access Problem   (Consider location/radiation/quality/duration/timing/severity/associated sxs/prior Treatment) HPI Comments: Pt comes in with cc of bleeding. Pt has hx of ESRD, and gets HD through her RLE fistula. States that she spontaneously started bleeding from the fistula site, prior to ED arrival, and thus came to the ER. Pressure dressing was placed prior to my evaluation,  And bleeding had ceased. She has no dizziness, lightheadedness, chest pains. She is not on anticoagulants.  The history is provided by the patient.    Past Medical History  Diagnosis Date  . Depression     3-4 years ago per husband  . Renal disorder   . Hypertension    Past Surgical History  Procedure Laterality Date  . No past surgeries    . Arteriovenous graft placement     No family history on file. History  Substance Use Topics  . Smoking status: Never Smoker   . Smokeless tobacco: Never Used  . Alcohol Use: No   OB History   Grav Para Term Preterm Abortions TAB SAB Ect Mult Living                 Review of Systems  Constitutional: Negative for activity change.  Respiratory: Negative for shortness of breath.   Cardiovascular: Negative for chest pain.  Gastrointestinal: Negative for nausea, vomiting and abdominal pain.  Genitourinary: Negative for dysuria.  Musculoskeletal: Negative for neck pain.  Neurological: Negative for headaches.  Hematological: Does not bruise/bleed easily.    Allergies  Review of patient's allergies indicates no known allergies.  Home Medications   Current Outpatient Rx  Name  Route  Sig  Dispense  Refill  . amLODipine (NORVASC) 10 MG tablet   Oral   Take 10 mg by mouth daily.         . carvedilol (COREG) 25 MG tablet   Oral   Take 25 mg by mouth 2 (two) times daily with a  meal.         . famotidine (PEPCID) 20 MG tablet   Oral   Take 20 mg by mouth daily.         . folic acid (FOLVITE) 1 MG tablet   Oral   Take 1 mg by mouth 2 (two) times daily.         Marland Kitchen lisinopril (PRINIVIL,ZESTRIL) 20 MG tablet   Oral   Take 20 mg by mouth 2 (two) times daily.         . multivitamin (RENA-VIT) TABS tablet   Oral   Take 1 tablet by mouth daily.         Marland Kitchen oxyCODONE-acetaminophen (PERCOCET/ROXICET) 5-325 MG per tablet   Oral   Take 1-2 tablets by mouth every 4 (four) hours as needed.   60 tablet   0    BP 137/99  Pulse 75  Temp(Src) 98.3 F (36.8 C) (Oral)  Resp 16  SpO2 100%  LMP 06/18/2013 Physical Exam  Nursing note and vitals reviewed. Constitutional: She appears well-developed.  HENT:  Head: Normocephalic.  Pulmonary/Chest: Effort normal.  Abdominal: Soft.  Neurological: She is alert.  Skin:  RLE exam: Fistula - with a good thrill. There is a thrombus notes at the bottom of the fistula - presumed site of the bleeding. No active bleeding.  ED Course  Procedures (including critical care time) Labs Review Labs Reviewed - No data to display Imaging Review No results found.  EKG Interpretation   None       MDM   1. Right thigh hemodialysis AV fistula bleed, initial encounter    Pt comes in with cc of HD fistula bleed. Pt's bleeding has stopped, she is asymptomatic, and thus there is no reason to check CBC.  We observed the patient in the ER for at least an hour, and she didn't re bleed. D/C home with pressure dressing on - and i gave them some band aids.  Varney Biles, MD 07/05/13 [phone]

## 2013-12-09 ENCOUNTER — Emergency Department (HOSPITAL_COMMUNITY)
Admission: EM | Admit: 2013-12-09 | Discharge: 2013-12-09 | Disposition: A | Discharge status: Home / Self Care | Payer: Medicaid Other | Attending: Emergency Medicine | Admitting: Emergency Medicine

## 2013-12-09 ENCOUNTER — Encounter (HOSPITAL_COMMUNITY): Payer: Self-pay | Admitting: Emergency Medicine

## 2013-12-09 DIAGNOSIS — Z992 Dependence on renal dialysis: Secondary | ICD-10-CM | POA: Insufficient documentation

## 2013-12-09 DIAGNOSIS — D649 Anemia, unspecified: Secondary | ICD-10-CM

## 2013-12-09 DIAGNOSIS — N186 End stage renal disease: Secondary | ICD-10-CM | POA: Insufficient documentation

## 2013-12-09 DIAGNOSIS — Z79899 Other long term (current) drug therapy: Secondary | ICD-10-CM | POA: Insufficient documentation

## 2013-12-09 DIAGNOSIS — Z3202 Encounter for pregnancy test, result negative: Secondary | ICD-10-CM | POA: Insufficient documentation

## 2013-12-09 DIAGNOSIS — I120 Hypertensive chronic kidney disease with stage 5 chronic kidney disease or end stage renal disease: Secondary | ICD-10-CM | POA: Insufficient documentation

## 2013-12-09 DIAGNOSIS — Z8659 Personal history of other mental and behavioral disorders: Secondary | ICD-10-CM | POA: Insufficient documentation

## 2013-12-09 LAB — URINE MICROSCOPIC-ADD ON

## 2013-12-09 LAB — POC URINE PREG, ED: PREG TEST UR: NEGATIVE

## 2013-12-09 LAB — I-STAT CHEM 8, ED
BUN: 21 mg/dL (ref 6–23)
CALCIUM ION: 1.03 mmol/L — AB (ref 1.12–1.23)
CHLORIDE: 96 meq/L (ref 96–112)
Creatinine, Ser: 5.2 mg/dL — ABNORMAL HIGH (ref 0.50–1.10)
GLUCOSE: 104 mg/dL — AB (ref 70–99)
HEMATOCRIT: 24 % — AB (ref 36.0–46.0)
Hemoglobin: 8.2 g/dL — ABNORMAL LOW (ref 12.0–15.0)
POTASSIUM: 4.3 meq/L (ref 3.7–5.3)
Sodium: 133 mEq/L — ABNORMAL LOW (ref 137–147)
TCO2: 25 mmol/L (ref 0–100)

## 2013-12-09 LAB — COMPREHENSIVE METABOLIC PANEL
ALT: 14 U/L (ref 0–35)
AST: 11 U/L (ref 0–37)
Albumin: 3.9 g/dL (ref 3.5–5.2)
Alkaline Phosphatase: 72 U/L (ref 39–117)
BUN: 21 mg/dL (ref 6–23)
CALCIUM: 8.7 mg/dL (ref 8.4–10.5)
CO2: 26 meq/L (ref 19–32)
CREATININE: 4.9 mg/dL — AB (ref 0.50–1.10)
Chloride: 96 mEq/L (ref 96–112)
GFR, EST AFRICAN AMERICAN: 13 mL/min — AB (ref >90)
GFR, EST NON AFRICAN AMERICAN: 11 mL/min — AB (ref >90)
GLUCOSE: 102 mg/dL — AB (ref 70–99)
Potassium: 4.6 mEq/L (ref 3.7–5.3)
Sodium: 136 mEq/L — ABNORMAL LOW (ref 137–147)
TOTAL PROTEIN: 7.1 g/dL (ref 6.0–8.3)
Total Bilirubin: <0.2 mg/dL — ABNORMAL LOW (ref 0.3–1.2)

## 2013-12-09 LAB — CBC
HEMATOCRIT: 22.3 % — AB (ref 36.0–46.0)
HEMOGLOBIN: 7.5 g/dL — AB (ref 12.0–15.0)
MCH: 31 pg (ref 26.0–34.0)
MCHC: 33.6 g/dL (ref 30.0–36.0)
MCV: 92.1 fL (ref 78.0–100.0)
Platelets: 192 10*3/uL (ref 150–400)
RBC: 2.42 MIL/uL — AB (ref 3.87–5.11)
RDW: 13.1 % (ref 11.5–15.5)
WBC: 8.2 10*3/uL (ref 4.0–10.5)

## 2013-12-09 LAB — URINALYSIS, ROUTINE W REFLEX MICROSCOPIC
BILIRUBIN URINE: NEGATIVE
Glucose, UA: 250 mg/dL — AB
Ketones, ur: NEGATIVE mg/dL
NITRITE: NEGATIVE
Protein, ur: 100 mg/dL — AB
SPECIFIC GRAVITY, URINE: 1.015 (ref 1.005–1.030)
UROBILINOGEN UA: 0.2 mg/dL (ref 0.0–1.0)

## 2013-12-09 NOTE — ED Notes (Signed)
Pt reports receiving dialysis today and coming home afterwards and feeling weak, pt reports this is not normal for her.  Spanish speaking only.

## 2013-12-09 NOTE — ED Provider Notes (Signed)
CSN: [id number]     Arrival date & time 12/09/13  1539 History   First MD Initiated Contact with Patient 12/09/13 1738     Chief Complaint  Patient presents with  . Weakness     (Consider location/radiation/quality/duration/timing/severity/associated sxs/prior Treatment) Patient is a 28 y.o. female presenting with weakness. The history is provided by the patient and a relative.  Weakness   patient here complaining of whole-body weakness x24 hours. She was able to complete her dialysis session today. She denies any shortness of breath or chest pain. Weakness has been persistent. She was at hemodialysis today and only completed 30 minutes of her session. No reported fever. Symptoms persisted and nothing makes them better worse. No treatment used prior to arrival.  Past Medical History  Diagnosis Date  . Depression     3-4 years ago per husband  . Renal disorder   . Hypertension    Past Surgical History  Procedure Laterality Date  . No past surgeries    . Arteriovenous graft placement     History reviewed. No pertinent family history. History  Substance Use Topics  . Smoking status: Never Smoker   . Smokeless tobacco: Never Used  . Alcohol Use: No   OB History   Grav Para Term Preterm Abortions TAB SAB Ect Mult Living                 Review of Systems  Neurological: Positive for weakness.  All other systems reviewed and are negative.     Allergies  Review of patient's allergies indicates no known allergies.  Home Medications   Prior to Admission medications   Medication Sig Start Date End Date Taking? Authorizing Provider  amLODipine (NORVASC) 10 MG tablet Take 10 mg by mouth daily.   Yes Historical Provider, MD  carvedilol (COREG) 25 MG tablet Take 25 mg by mouth daily.    Yes Historical Provider, MD  folic acid (FOLVITE) 1 MG tablet Take 2 mg by mouth daily.    Yes Historical Provider, MD  lisinopril (PRINIVIL,ZESTRIL) 40 MG tablet Take 40 mg by mouth at  bedtime.   Yes Historical Provider, MD  multivitamin (RENA-VIT) TABS tablet Take 1 tablet by mouth daily.   Yes Historical Provider, MD  sevelamer carbonate (RENVELA) 800 MG tablet Take 800 mg by mouth 2 (two) times daily.   Yes Historical Provider, MD   BP 157/97  Pulse 68  Temp(Src) 98.1 F (36.7 C) (Oral)  Resp 18  Ht 5\' 1"  (1.549 m)  Wt 104 lb (47.174 kg)  BMI 19.66 kg/m2  SpO2 100% Physical Exam  Nursing note and vitals reviewed. Constitutional: She is oriented to person, place, and time. She appears well-developed and well-nourished.  Non-toxic appearance. No distress.  HENT:  Head: Normocephalic and atraumatic.  Eyes: Conjunctivae, EOM and lids are normal. Pupils are equal, round, and reactive to light.  Neck: Normal range of motion. Neck supple. No tracheal deviation present. No mass present.  Cardiovascular: Normal rate, regular rhythm and normal heart sounds.  Exam reveals no gallop.   No murmur heard. Pulmonary/Chest: Effort normal and breath sounds normal. No stridor. No respiratory distress. She has no decreased breath sounds. She has no wheezes. She has no rhonchi. She has no rales.  Abdominal: Soft. Normal appearance and bowel sounds are normal. She exhibits no distension. There is no tenderness. There is no rebound and no CVA tenderness.  Musculoskeletal: Normal range of motion. She exhibits no edema and no tenderness.  Neurological:  She is alert and oriented to person, place, and time. She has normal strength. No cranial nerve deficit or sensory deficit. GCS eye subscore is 4. GCS verbal subscore is 5. GCS motor subscore is 6.  Skin: Skin is warm and dry. No abrasion and no rash noted.  Psychiatric: She has a normal mood and affect. Her speech is normal and behavior is normal.    ED Course  Procedures (including critical care time) Labs Review Labs Reviewed  CBC - Abnormal; Notable for the following:    RBC 2.42 (*)    Hemoglobin 7.5 (*)    HCT 22.3 (*)    All  other components within normal limits  URINALYSIS, ROUTINE W REFLEX MICROSCOPIC - Abnormal; Notable for the following:    Color, Urine STRAW (*)    APPearance CLOUDY (*)    pH >9.0 (*)    Glucose, UA 250 (*)    Hgb urine dipstick LARGE (*)    Protein, ur 100 (*)    Leukocytes, UA SMALL (*)    All other components within normal limits  URINE MICROSCOPIC-ADD ON - Abnormal; Notable for the following:    Squamous Epithelial / LPF MANY (*)    Bacteria, UA FEW (*)    All other components within normal limits  I-STAT CHEM 8, ED - Abnormal; Notable for the following:    Sodium 133 (*)    Creatinine, Ser 5.20 (*)    Glucose, Bld 104 (*)    Calcium, Ion 1.03 (*)    Hemoglobin 8.2 (*)    HCT 24.0 (*)    All other components within normal limits  COMPREHENSIVE METABOLIC PANEL  POC URINE PREG, ED    Imaging Review No results found.   EKG Interpretation None      MDM   Final diagnoses:  None    Spoke with the kidney physician on call and they will followup with the patient's low hemoglobin    Leota Jacobsen, MD 12/09/13 2005

## 2013-12-09 NOTE — ED Notes (Signed)
Pt here for not feeling well since dialysis, feels like her body is asleep.

## 2013-12-09 NOTE — Discharge Instructions (Signed)
Anemia inespecfica (Anemia, Nonspecific) La anemia es una enfermedad en la que la concentracin de glbulos rojos o el nivel de hemoglobina en la sangre estn por debajo de lo normal. La hemoglobina es la sustancia de los glbulos rojos que lleva el oxgeno a todo el cuerpo. La anemia da como resultado que los tejidos no reciban la cantidad suficiente de oxgeno.  CAUSAS  Las causas ms frecuentes de anemia son:   Elvina Mattes. El sangrado puede ser interno o externo. Incluye sangrado excesivo debido al perodo (en las mujeres) o por los intestinos.   Dficit nutricional.   Enfermedad renal, tiroidea o heptica crnicas.  Enfermedades de la mdula sea que disminuyen la produccin de glbulos rojos.  Cncer y tratamientos para Science writer.  VIH, sida y sus tratamientos.  Trastornos del bazo que aumentan la destruccin de glbulos rojos.  Enfermedades de [employer].  Destruccin excesiva de glbulos rojos debido a una infeccin, a medicamentos y a Nurse, mental health. SIGNOS Y SNTOMAS   Debilidad leve.   Mareos.   Dolor de [place].  Palpitaciones.   Falta de aire, especialmente con el ejercicio.   Palidez.  Sensibilidad al fro.  Indigestin.  Nuseas.  Dificultad para dormir.  Dificultad para concentrarse. Los sntomas pueden ocurrir repentinamente o pueden Psychologist, forensic.  DIAGNSTICO  Con frecuencia es necesario realizar anlisis de [employer]. Estos ayudan al profesional a Adult nurse. Su mdico controlar la materia fecal para Hydrographic surveyor la presencia de [place] y buscar otras causas de prdida de [place].  TRATAMIENTO  El tratamiento vara segn la causa de la anemia. Las opciones de tratamiento son:   Suplementos de hierro, vitamina [id number], o cido flico.   Medicamentos con hormonas.   Transfusin de [place]. Ser necesaria en los casos de prdida de [place] grave.   Hospitalizacin. Ser necesaria si la  prdida de sangre es continua y significativa.   Cambios en la dieta.  Extirpacin del bazo. INSTRUCCIONES PARA EL CUIDADO EN EL HOGAR Cumpla con todas las visitas de control. Generalmente demora varias semanas corregir la anemia, y es muy importante que el mdico controle su enfermedad y su respuesta al [place]. SOLICITE ATENCIN MDICA DE INMEDIATO SI:   Siente debilidad extrema, falta de aire o dolor en el pecho.   Se siente mareado o tiene dificultad para concentrarse.  Tiene una hemorragia vaginal abundante.   Aparece una erupcin cutnea.   La materia fecal es negra, de aspecto alquitranado.   Se desmaya.   Vomita sangre.   Vomita repetidas veces.   Siente dolor abdominal.  Tiene fiebre o sntomas persistentes durante ms de 2 - 3 das.   Tiene fiebre y los sntomas empeoran repentinamente.   Se deshidrata.  ASEGRESE DE QUE:  Comprende estas instrucciones.  Controlar su afeccin.  Recibir ayuda de inmediato si no mejora o si empeora. Document Released: 07/31/2005 Document Revised: 04/02/2013 [facility] Patient Information 2014 [place], [place].

## 2013-12-10 LAB — TYPE AND SCREEN
ABO/RH(D): O POS
ANTIBODY SCREEN: NEGATIVE

## 2013-12-16 ENCOUNTER — Inpatient Hospital Stay (HOSPITAL_COMMUNITY)
Admission: AD | Admit: 2013-12-16 | Discharge: 2013-12-16 | Disposition: A | Discharge status: Home / Self Care | Payer: Medicaid Other | Source: Ambulatory Visit | Attending: Obstetrics & Gynecology | Admitting: Obstetrics & Gynecology

## 2013-12-16 ENCOUNTER — Inpatient Hospital Stay (HOSPITAL_COMMUNITY): Payer: Medicaid Other

## 2013-12-16 ENCOUNTER — Encounter (HOSPITAL_COMMUNITY): Payer: Self-pay | Admitting: General Practice

## 2013-12-16 DIAGNOSIS — I129 Hypertensive chronic kidney disease with stage 1 through stage 4 chronic kidney disease, or unspecified chronic kidney disease: Secondary | ICD-10-CM | POA: Insufficient documentation

## 2013-12-16 DIAGNOSIS — N939 Abnormal uterine and vaginal bleeding, unspecified: Secondary | ICD-10-CM

## 2013-12-16 DIAGNOSIS — N189 Chronic kidney disease, unspecified: Secondary | ICD-10-CM | POA: Insufficient documentation

## 2013-12-16 DIAGNOSIS — N926 Irregular menstruation, unspecified: Secondary | ICD-10-CM

## 2013-12-16 DIAGNOSIS — N949 Unspecified condition associated with female genital organs and menstrual cycle: Secondary | ICD-10-CM | POA: Insufficient documentation

## 2013-12-16 DIAGNOSIS — N938 Other specified abnormal uterine and vaginal bleeding: Secondary | ICD-10-CM | POA: Insufficient documentation

## 2013-12-16 DIAGNOSIS — D500 Iron deficiency anemia secondary to blood loss (chronic): Secondary | ICD-10-CM

## 2013-12-16 LAB — WET PREP, GENITAL
Clue Cells Wet Prep HPF POC: NONE SEEN
TRICH WET PREP: NONE SEEN
YEAST WET PREP: NONE SEEN

## 2013-12-16 LAB — CBC
HCT: 21.8 % — ABNORMAL LOW (ref 36.0–46.0)
Hemoglobin: 7.1 g/dL — ABNORMAL LOW (ref 12.0–15.0)
MCH: 31.6 pg (ref 26.0–34.0)
MCHC: 32.6 g/dL (ref 30.0–36.0)
MCV: 96.9 fL (ref 78.0–100.0)
PLATELETS: 286 10*3/uL (ref 150–400)
RBC: 2.25 MIL/uL — ABNORMAL LOW (ref 3.87–5.11)
RDW: 14.5 % (ref 11.5–15.5)
WBC: 12.8 10*3/uL — AB (ref 4.0–10.5)

## 2013-12-16 LAB — HCG, SERUM, QUALITATIVE: Preg, Serum: NEGATIVE

## 2013-12-16 MED ORDER — MEGESTROL ACETATE 40 MG PO TABS: ORAL_TABLET | ORAL | Status: DC

## 2013-12-16 MED ORDER — INTEGRA PLUS PO CAPS: 1.0000 | ORAL_CAPSULE | Freq: Every day | ORAL | Status: DC

## 2013-12-16 NOTE — MAU Note (Addendum)
Has been having small amt of vag bleeding for the last 35 days.  Mainly sees when uses restroom. Has not seen GYN in years. Periods are irregular, sometimes goes 5 months with out period.  Dialysis clinic told her to come here.

## 2013-12-16 NOTE — MAU Provider Note (Signed)
Discussed with PA, Ms Either, pt not symptomatic.  Will attempt to control bleeding with Megace and recheck CBC.  Rec f/u in ofc for further eval.  Attestation of Attending Supervision of Advanced Practitioner (CNM/NP): Evaluation and management procedures were performed by the Advanced Practitioner under my supervision and collaboration.  I have reviewed the Advanced Practitioner's note and chart, and I agree with the management and plan.  Lavonia Drafts 4:39 PM

## 2013-12-16 NOTE — Discharge Instructions (Signed)
Hemorragia uterina disfuncional (Abnormal Uterine Bleeding) La hemorragia uterina disfuncional puede tener muchas causas. En algunos casos se mejora simplemente con un tratamiento. En otros casos puede ser ms grave. Hay varios tipos de hemorragia que se consideran disfuncionales. Ellas son:  Minda Meo perodos.  [place].  Prdida de sangre en cualquier momento del ciclo menstrual.  Hemorragia abundante o ms que lo habitual.  Sangrado luego de la menopausia. CAUSAS Hay numerosas causas que originan este problema. Puede ocurrir en adolescentes, mujeres embarazadas, en aquellas que se encuentran en su vida reproductiva y en las que han llegado a la menopausia. El mdico buscar las causas ms comunes, de acuerdo a su edad, los signos y los sntomas que presenta y su Medical laboratory scientific officer. En la [employer] no reviste gravedad y se trata sin dificultad. An en los casos ms graves, como el cncer en los rganos femeninos. puede tratarse adecuadamente si se detecta en etapas tempranas. Es por eso que todos los tipos de hemorragia deben evaluarse y tratarse lo antes posible. DIAGNSTICO El diagnstico de la causa puede requerir varios tipos de pruebas. El profesional podr:  Optometrist una historia completa del tipo de hemorragia.  Realizar un examen fsico completo y un papanicolau.  Indicar una ecografa de abdomen que muestre una imagen de los rganos femeninos y de la pelvis.  Podr inyectar una sustancia de contraste en el tero y las trompas de [place] y tomar radiografas (histerosalpingografa).  Inyectar lquido en el tero para realizar una ecografa (sonohisterografa)  Indicar una tomografa computada para examinar los rganos femeninos y la pelvis.  Indicar una resonancia magntica para examinar los rganos femeninos y la pelvis. Este procedimiento no implica la aplicacin de rayos X.  Observar el interior del tero con un  dispositivo similar a un telescopo que tiene una luz en un extremo (histeroscopio).  Realizar un raspado del tero para obtener muestras de tejido y examinarlas (dilatacin y curetaje).  Observar el interior de la pelvis con un dispositivo similar a un telescopio que tiene una luz en un extremo (laparoscopio). Esto se lleva a cabo a travs de un corte muy pequeo (incisin) en el abdomen. TRATAMIENTO El tratamiento depender de la causa. Podr incluir:  No tomar medidas y dejar que el problema se solucione por s mismo.  Tratamiento hormonal.  Pldoras anticonceptivas.  Tratamiento del problema mdico que causa la hemorragia.  Laparoscopa:  Ciruga mayor o menor.  Destruccin del tapizado interno de la pared del tero con corriente elctrica, rayo lser, congelamiento o calor (ablacin uterina). [place] recomendaciones de su mdico acerca de cmo tratar el problema.  Consulte con su mdico si no tiene su perodo menstrual y piensa que puede estar [place].  Si presenta una hemorragia abundante, cuente el nmero de apsitos o tampones que [place] y con qu frecuencia debe cambiarlos. Convrselo con el profesional que la asiste.  Evite las relaciones sexuales hasta que el problema sea controlado. SOLICITE ATENCIN MDICA SI:  Sufre algn tipo de hemorragia disfuncional como las ya mencionadas.  Se siente mareada.  Tiene ms de 8 aos y an no ha tenido su perodo menstrual. SOLICITE ATENCIN MDICA DE INMEDIATO SI:  Se desmaya.  Debe cambiarse el apsito o el tampn cada 15 a 30 minutos.  Siente dolor abdominal.  La temperatura se eleva por encima de 100 F (37.8 C).  Philbert Riser o se siente dbil.  Elimina cogulos grandes por la vagina.  Si tiene  malestar estomacal (nuseas) y vmitos. Document Released: 07/31/2005 Document Revised: 10/23/2011 [facility] Patient Information 2014 [place], [place].  Anemia por deficiencia  de hierro en adultos (Iron Deficiency Anemia, Adult) La anemia sucede cuando la cantidad de glbulos rojos sanos es baja. Con frecuencia, la causa es una cantidad insuficiente de hierro. Esto se denomina anemia por deficiencia de hierro y puede provocarle cansancio y dificultad para respirar. [place] vitaminas segn las indicaciones del mdico.  Consuma alimentos que contengan hierro. Estos incluyen hgado, carne magra, pan de grano integral, huevos, frutas deshidratadas y vegetales de hojas color verde oscuro. SOLICITE AYUDA DE INMEDIATO SI:  Pierde el conocimiento (se desmaya).  Siente dolor en el pecho.  Tiene malestar estomacal (nuseas) o vomita.  Tiene mucha dificultad para respirar cuando realiza actividades.  Se siente dbil.  La frecuencia cardaca est acelerada.  Comienza a sudar sin motivo.  Se siente mareado cuando se levanta de una silla o de la cama. ASEGRESE DE QUE:  Comprende estas instrucciones.  Controlar su afeccin.  Recibir ayuda de inmediato si no mejora o si empeora. Document Released: 11/04/2010 Document Revised: 05/21/2013 [facility] Patient Information 2014 [place], [place].

## 2013-12-16 NOTE — MAU Provider Note (Signed)
History     CSN: [id number]  Arrival date and time: 12/16/13 1137   First Provider Initiated Contact with Patient 12/16/13 1302      Chief Complaint  Patient presents with  . Vaginal Bleeding   HPI Ms. Katie Lyons is a 28 y.o. G0P0 with CKD on dialysis who presents to MAU today from the dialysis center because of "low hemoglobin." the patient states that she has had spotting x 35 days with occasional small clots. The blood is bright red. She states a 5 year history of irregular bleeding that started ~ 2 years after she was started on dialysis. She states daily headaches. Headache pain now is rated at 9/10. She was given Tylenol at the dialysis center a few hours ago with minimal relief. She is sexually active and uses condoms. She states a small amount of "normal" vaginal discharge. She states that she occasionally feels tired, but denies weakness, dizziness, LOC or SOB.   OB History   Grav Para Term Preterm Abortions TAB SAB Ect Mult Living            3      Past Medical History  Diagnosis Date  . Depression     3-4 years ago per husband  . Renal disorder   . Hypertension     Past Surgical History  Procedure Laterality Date  . No past surgeries    . Arteriovenous graft placement      History reviewed. No pertinent family history.  History  Substance Use Topics  . Smoking status: Never Smoker   . Smokeless tobacco: Never Used  . Alcohol Use: No    Allergies: No Known Allergies  No prescriptions prior to admission    Review of Systems  Constitutional: Negative for fever and malaise/fatigue.  Gastrointestinal: Positive for nausea. Negative for vomiting and abdominal pain.  Genitourinary: Negative for dysuria, urgency and frequency.       + vaginal bleeding, discharge   Physical Exam   Blood pressure 120/71, pulse 69, temperature 98.8 F (37.1 C), temperature source Oral, resp. rate 18, last menstrual period 11/12/2013.  Physical Exam   Constitutional: She is oriented to person, place, and time. She appears well-developed and well-nourished. No distress.  HENT:  Head: Normocephalic and atraumatic.  Cardiovascular: Normal rate, regular rhythm and normal heart sounds.   Respiratory: Effort normal and breath sounds normal. No respiratory distress.  GI: Soft. Bowel sounds are normal. She exhibits no distension and no mass. There is tenderness (mild lower abdominal tendernes to palpation bilaterally). There is no rebound and no guarding.  Genitourinary: Uterus is tender (mild). Uterus is not enlarged. Cervix exhibits no motion tenderness, no discharge and no friability. Right adnexum displays tenderness (mild). Right adnexum displays no mass. Left adnexum displays tenderness (mild). Left adnexum displays no mass. There is bleeding (small amount of blood with one small clot in the vaginal vault) around the vagina. Vaginal discharge (scant thin, white discharge noted) found.  Neurological: She is alert and oriented to person, place, and time.  Skin: Skin is warm and dry. No erythema.  Psychiatric: She has a normal mood and affect.   Results for orders placed during the hospital encounter of 12/16/13 (from the past 24 hour(s))  CBC     Status: Abnormal   Collection Time    12/16/13 12:52 PM      Result Value Ref Range   WBC 12.8 (*) 4.0 - 10.5 K/uL   RBC 2.25 (*) 3.87 - 5.11 MIL/uL  Hemoglobin 7.1 (*) 12.0 - 15.0 g/dL   HCT 21.8 (*) 36.0 - 46.0 %   MCV 96.9  78.0 - 100.0 fL   MCH 31.6  26.0 - 34.0 pg   MCHC 32.6  30.0 - 36.0 g/dL   RDW 14.5  11.5 - 15.5 %   Platelets 286  150 - 400 K/uL  WET PREP, GENITAL     Status: Abnormal   Collection Time    12/16/13  1:23 PM      Result Value Ref Range   Yeast Wet Prep HPF POC NONE SEEN  NONE SEEN   Trich, Wet Prep NONE SEEN  NONE SEEN   Clue Cells Wet Prep HPF POC NONE SEEN  NONE SEEN   WBC, Wet Prep HPF POC FEW (*) NONE SEEN   US Transvaginal Non-ob  12/16/2013   CLINICAL DATA:   Abnormal uterine bleeding for 35 days. Decreased hematocrit. LMP 11/12/2013, still bleeding.  EXAM: TRANSABDOMINAL AND TRANSVAGINAL ULTRASOUND OF PELVIS  TECHNIQUE: Both transabdominal and transvaginal ultrasound examinations of the pelvis were performed. Transabdominal technique was performed for global imaging of the pelvis including uterus, ovaries, adnexal regions, and pelvic cul-de-sac. It was necessary to proceed with endovaginal exam following the transabdominal exam to visualize the .  COMPARISON:  None  FINDINGS: Uterus  Measurements: 9.1 x 4.3 x 5.6 cm. Anteverted. No fibroids or other mass visualized.  Endometrium  Thickness: 8.7 mm. Heterogeneous in echotexture. Trace amount of free fluid in the endometrial canal.  Right ovary  Measurements: 4.5 x 2.2 x 3.2 cm. Normal appearance/no adnexal mass.  Left ovary  Measurements: 4.0 x 2.2 x 3.3 cm. Normal appearance/no adnexal mass.  Other findings  Moderate to large volume of free pelvic fluid.  IMPRESSION: 1. Endometrium is normal in thickness (8.7 mm). 2. Moderate to large amount of free fluid in the pelvis.   Electronically Signed   By: Curlene Dolphin M.D.   On: 12/16/2013 14:36   US Pelvis Complete  12/16/2013   CLINICAL DATA:  Abnormal uterine bleeding for 35 days. Decreased hematocrit. LMP 11/12/2013, still bleeding.  EXAM: TRANSABDOMINAL AND TRANSVAGINAL ULTRASOUND OF PELVIS  TECHNIQUE: Both transabdominal and transvaginal ultrasound examinations of the pelvis were performed. Transabdominal technique was performed for global imaging of the pelvis including uterus, ovaries, adnexal regions, and pelvic cul-de-sac. It was necessary to proceed with endovaginal exam following the transabdominal exam to visualize the .  COMPARISON:  None  FINDINGS: Uterus  Measurements: 9.1 x 4.3 x 5.6 cm. Anteverted. No fibroids or other mass visualized.  Endometrium  Thickness: 8.7 mm. Heterogeneous in echotexture. Trace amount of free fluid in the endometrial canal.   Right ovary  Measurements: 4.5 x 2.2 x 3.2 cm. Normal appearance/no adnexal mass.  Left ovary  Measurements: 4.0 x 2.2 x 3.3 cm. Normal appearance/no adnexal mass.  Other findings  Moderate to large volume of free pelvic fluid.  IMPRESSION: 1. Endometrium is normal in thickness (8.7 mm). 2. Moderate to large amount of free fluid in the pelvis.   Electronically Signed   By: Curlene Dolphin M.D.   On: 12/16/2013 14:36    MAU Course  Procedures None  MDM Patient unable to give urine sample today Cath attempted for UA, no UOP CBC today Discussed patient and results with Dr. Ihor Dow. Start on Megace 40 mg BID x 2 weeks, then 40 mg daily until clinic appointment. Integra for iron supplementation  Assessment and Plan  A: CKD on dialysis Abnormal uterine bleeding  P: Discharge home Rx for Megace and Integra given to patient with verbal instructions using interpreter Bleeding precautions discussed Patient advised that if bleeding persists or worsens she may require blood transfusion Discussed signs and symptoms of severe anemia Patient to follow-up in [place] on 12/31/13 at 1:00 pm Patient may return to MAU as needed or if her condition were to change or worsen  Farris Has, PA-C  12/16/2013, 3:17 PM

## 2013-12-17 LAB — GC/CHLAMYDIA PROBE AMP
CT Probe RNA: NEGATIVE
GC PROBE AMP APTIMA: NEGATIVE

## 2013-12-31 ENCOUNTER — Ambulatory Visit: Payer: Medicaid Other | Admitting: Obstetrics & Gynecology

## 2013-12-31 ENCOUNTER — Telehealth: Payer: Self-pay

## 2013-12-31 NOTE — Telephone Encounter (Signed)
Patient missed today's MAU f/u appointment. Attempted to call patient on all numbers listed. Both numbers not in service. Called emergency contact who gave [phone] as patient's number.

## 2013-12-31 NOTE — Telephone Encounter (Signed)
Patient returned call and stated she did not know she had a an appointment. Appointment made for 0315 tomorrow 01/01/14-- pt. Informed.

## 2014-01-01 ENCOUNTER — Encounter: Payer: Self-pay | Admitting: Obstetrics & Gynecology

## 2014-01-01 ENCOUNTER — Ambulatory Visit (INDEPENDENT_AMBULATORY_CARE_PROVIDER_SITE_OTHER): Payer: Medicaid Other | Admitting: Obstetrics & Gynecology

## 2014-01-01 VITALS — BP 141/91 | HR 97 | Temp 98.1°F | Ht 59.0 in | Wt 103.7 lb

## 2014-01-01 DIAGNOSIS — D649 Anemia, unspecified: Secondary | ICD-10-CM

## 2014-01-01 DIAGNOSIS — N938 Other specified abnormal uterine and vaginal bleeding: Secondary | ICD-10-CM

## 2014-01-01 DIAGNOSIS — N949 Unspecified condition associated with female genital organs and menstrual cycle: Secondary | ICD-10-CM

## 2014-01-01 NOTE — Progress Notes (Signed)
   Subjective:    Patient ID: Katie Lyons, female    DOB: 1986-03-09, 28 y.o.   MRN: [id number]  HPI  28 yo H P3 with chronic renal failure on dialysis is here today for follow up after ER visit yesterday. She was seen there for a very heavy period. Her u/s was normal. Her hbg was 7.1. She is currently using condoms for contraception. She tells me that she skipped 5 periods in a row and then had 2 heavy periods.  Review of Systems     Objective:   Physical Exam  Normal cervix       Assessment & Plan:  DUB with anemia. I am sure that part of the reason for her anemia is her renal failure. She will need to fill out the form to get a free Mirena. Until then she will continue the Megace and condoms.

## 2014-06-15 ENCOUNTER — Encounter: Payer: Self-pay | Admitting: Obstetrics & Gynecology

## 2014-11-05 ENCOUNTER — Encounter: Payer: Self-pay | Admitting: Internal Medicine

## 2014-11-06 ENCOUNTER — Encounter (HOSPITAL_COMMUNITY): Payer: Self-pay | Admitting: Emergency Medicine

## 2014-11-06 ENCOUNTER — Emergency Department (HOSPITAL_COMMUNITY)
Admission: EM | Admit: 2014-11-06 | Discharge: 2014-11-06 | Disposition: A | Discharge status: Home / Self Care | Payer: Self-pay | Attending: Emergency Medicine | Admitting: Emergency Medicine

## 2014-11-06 DIAGNOSIS — R011 Cardiac murmur, unspecified: Secondary | ICD-10-CM | POA: Insufficient documentation

## 2014-11-06 DIAGNOSIS — I120 Hypertensive chronic kidney disease with stage 5 chronic kidney disease or end stage renal disease: Secondary | ICD-10-CM | POA: Insufficient documentation

## 2014-11-06 DIAGNOSIS — Z79899 Other long term (current) drug therapy: Secondary | ICD-10-CM | POA: Insufficient documentation

## 2014-11-06 DIAGNOSIS — D649 Anemia, unspecified: Secondary | ICD-10-CM | POA: Insufficient documentation

## 2014-11-06 DIAGNOSIS — N186 End stage renal disease: Secondary | ICD-10-CM | POA: Insufficient documentation

## 2014-11-06 DIAGNOSIS — Z8659 Personal history of other mental and behavioral disorders: Secondary | ICD-10-CM | POA: Insufficient documentation

## 2014-11-06 LAB — BASIC METABOLIC PANEL
Anion gap: 7 (ref 5–15)
BUN: <5 mg/dL — ABNORMAL LOW (ref 6–23)
CO2: 35 mmol/L — AB (ref 19–32)
Calcium: 9 mg/dL (ref 8.4–10.5)
Chloride: 95 mmol/L — ABNORMAL LOW (ref 96–112)
Creatinine, Ser: 2.72 mg/dL — ABNORMAL HIGH (ref 0.50–1.10)
GFR calc Af Amer: 26 mL/min — ABNORMAL LOW (ref >90)
GFR calc non Af Amer: 22 mL/min — ABNORMAL LOW (ref >90)
GLUCOSE: 122 mg/dL — AB (ref 70–99)
POTASSIUM: 3.2 mmol/L — AB (ref 3.5–5.1)
Sodium: 137 mmol/L (ref 135–145)

## 2014-11-06 LAB — CBC
HEMATOCRIT: 28.4 % — AB (ref 36.0–46.0)
HEMOGLOBIN: 9.1 g/dL — AB (ref 12.0–15.0)
MCH: 30.8 pg (ref 26.0–34.0)
MCHC: 32 g/dL (ref 30.0–36.0)
MCV: 96.3 fL (ref 78.0–100.0)
Platelets: 283 10*3/uL (ref 150–400)
RBC: 2.95 MIL/uL — AB (ref 3.87–5.11)
RDW: 16.2 % — ABNORMAL HIGH (ref 11.5–15.5)
WBC: 6.4 10*3/uL (ref 4.0–10.5)

## 2014-11-06 NOTE — ED Notes (Signed)
Pt gets dialysis MWF- access to R leg. Pt had dialysis today and was called later to inform her that she needed a blood transfusion and to come to ER. Pt denies any complaints at this time.

## 2014-11-06 NOTE — Discharge Instructions (Signed)
Please read and follow all provided instructions.  Your diagnoses today include:  1. Anemia, unspecified anemia type   2. ESRD (end stage renal disease)     Tests performed today include:  Blood counts and electrolytes - hemoglobin is 9.1 here which is low, but not so low that you need an emergent transfusion  Vital signs. See below for your results today.   Medications prescribed:   None  Take any prescribed medications only as directed.  Home care instructions:  Follow any educational materials contained in this packet.  Follow-up instructions: Please follow-up with your primary care provider in the next 3 days for further evaluation of your symptoms.   Return instructions:   Please return to the Emergency Department if you experience worsening symptoms.   Return if you pass out, become short of breath, or pass large amounts of blood from vagina or in stool  Please return if you have any other emergent concerns.  Additional Information:  Your vital signs today were: BP 124/80 mmHg   Pulse 84   Temp(Src) 98.2 F (36.8 C) (Oral)   Resp 19   Wt 102 lb 1 oz (46.295 kg)   SpO2 100%   LMP 11/04/2014 (Exact Date) If your blood pressure (BP) was elevated above 135/85 this visit, please have this repeated by your doctor within one month. --------------

## 2014-11-06 NOTE — ED Provider Notes (Signed)
CSN: [id number]     Arrival date & time 11/06/14  1655 History   First MD Initiated Contact with Patient 11/06/14 1941     Chief Complaint  Patient presents with  . Anemia     (Consider location/radiation/quality/duration/timing/severity/associated sxs/prior Treatment) HPI Comments: Patient with history of end-stage renal disease on Monday/Wednesday/Friday dialysis -- presents after being told to come to the emergency department for a blood transfusion. She does not know what her hemoglobin level was. She states that she has had fatigue recently and some short-lived dizziness/lightheadednes with standing but she has not passed out. She is not short of breath. She does not have any fever, nausea or vomiting. Patient states that she does have prolonged menstrual periods. She states that she recently had minor vaginal bleeding for approximately 1-1-1/2 months. She is not currently having any bleeding. She denies any blood in her stools or dark stools. She states that she has never had a blood transfusion in the past. Onset of symptoms acute. Course is constant. Nothing makes symptoms better or worse.  Patient is a 29 y.o. female presenting with anemia. The history is provided by the patient. A language interpreter was used (telephone interpreter).  Anemia Associated symptoms include fatigue. Pertinent negatives include no abdominal pain, chest pain, coughing, fever, headaches, myalgias, nausea, rash, sore throat, vomiting or weakness.    Past Medical History  Diagnosis Date  . Depression     3-4 years ago per husband  . Renal disorder   . Hypertension    Past Surgical History  Procedure Laterality Date  . No past surgeries    . Arteriovenous graft placement     No family history on file. History  Substance Use Topics  . Smoking status: Never Smoker   . Smokeless tobacco: Never Used  . Alcohol Use: No   OB History    Gravida Para Term Preterm AB TAB SAB Ectopic Multiple Living   3 3  3  0 0 0 0 0 0 3     Review of Systems  Constitutional: Positive for fatigue. Negative for fever.  HENT: Negative for rhinorrhea and sore throat.   Eyes: Negative for redness.  Respiratory: Negative for cough and shortness of breath.   Cardiovascular: Negative for chest pain.  Gastrointestinal: Negative for nausea, vomiting, abdominal pain and diarrhea.  Genitourinary: Negative for dysuria.  Musculoskeletal: Negative for myalgias.  Skin: Negative for rash.  Neurological: Positive for light-headedness. Negative for syncope, weakness and headaches.      Allergies  Review of patient's allergies indicates no known allergies.  Home Medications   Prior to Admission medications   Medication Sig Start Date End Date Taking? Authorizing Provider  amLODipine (NORVASC) 10 MG tablet Take 10 mg by mouth daily.   Yes Historical Provider, MD  carvedilol (COREG) 25 MG tablet Take 25 mg by mouth daily.    Yes Historical Provider, MD  FeFum-FePoly-FA-B Cmp-C-Biot (INTEGRA PLUS) CAPS Take 1 capsule by mouth daily. 12/16/13  Yes Luvenia Redden, PA-C  folic acid (FOLVITE) 1 MG tablet Take 2 mg by mouth daily.    Yes Historical Provider, MD  lisinopril (PRINIVIL,ZESTRIL) 40 MG tablet Take 40 mg by mouth at bedtime.   Yes Historical Provider, MD  megestrol (MEGACE) 40 MG tablet 40 mg BID x 14 days, then 40 mg daily 12/16/13  Yes Luvenia Redden, PA-C  multivitamin (RENA-VIT) TABS tablet Take 1 tablet by mouth daily.   Yes Historical Provider, MD  sevelamer carbonate (RENVELA) 800 MG  tablet Take 800 mg by mouth 2 (two) times daily.   Yes Historical Provider, MD   BP 124/80 mmHg  Pulse 84  Temp(Src) 98.2 F (36.8 C) (Oral)  Resp 19  Wt 102 lb 1 oz (46.295 kg)  SpO2 100%  LMP 11/04/2014 (Exact Date)   Physical Exam  Constitutional: She appears well-developed and well-nourished.  HENT:  Head: Normocephalic and atraumatic.  Mouth/Throat: Oropharynx is clear and moist.  Eyes: Right eye exhibits no  discharge. Left eye exhibits no discharge.  Mild conjunctival pallor  Neck: Normal range of motion. Neck supple.  Cardiovascular: Normal rate and regular rhythm.   Murmur (2/6 systolic murmur upper sternal border) heard. Pulmonary/Chest: Effort normal and breath sounds normal. No respiratory distress. She has no wheezes. She has no rales.  Abdominal: Soft. There is no tenderness.  Musculoskeletal: She exhibits no edema or tenderness.  Neurological: She is alert.  Skin: Skin is warm and dry.  Psychiatric: She has a normal mood and affect.  Nursing note and vitals reviewed.   ED Course  Procedures (including critical care time) Labs Review Labs Reviewed  CBC - Abnormal; Notable for the following:    RBC 2.95 (*)    Hemoglobin 9.1 (*)    HCT 28.4 (*)    RDW 16.2 (*)    All other components within normal limits  BASIC METABOLIC PANEL - Abnormal; Notable for the following:    Potassium 3.2 (*)    Chloride 95 (*)    CO2 35 (*)    Glucose, Bld 122 (*)    BUN <5 (*)    Creatinine, Ser 2.72 (*)    GFR calc non Af Amer 22 (*)    GFR calc Af Amer 26 (*)    All other components within normal limits  TYPE AND SCREEN    Imaging Review No results found.   EKG Interpretation None       8:48 PM Patient seen and examined. Hemoglobin is 9.1 here. Patient has had some fatigue but is not significantly symptomatic from her mild anemia which appears to be at her baseline. Do not feel that transfusion is required at this time. Will have patient follow-up at dialysis on Monday. Encouraged patient to return with syncope or worsening symptoms.  Vital signs reviewed and are as follows: BP 124/80 mmHg  Pulse 84  Temp(Src) 98.2 F (36.8 C) (Oral)  Resp 19  Wt 102 lb 1 oz (46.295 kg)  SpO2 100%  LMP 11/04/2014 (Exact Date)    MDM   Final diagnoses:  None   Mild anemia at 9.1. No indications for transfusion. Mild hypokalemia, will not replete given patient is a dialysis  patient.   Carlisle Cater, PA-C 11/06/14 2100  Evelina Bucy, MD 11/06/14 [phone]

## 2014-11-07 LAB — TYPE AND SCREEN
ABO/RH(D): O POS
Antibody Screen: NEGATIVE

## 2014-12-11 ENCOUNTER — Encounter: Payer: Self-pay | Admitting: *Deleted

## 2014-12-30 ENCOUNTER — Ambulatory Visit: Payer: Self-pay | Admitting: Internal Medicine

## 2016-01-03 ENCOUNTER — Encounter: Payer: Self-pay | Admitting: Vascular Surgery

## 2016-01-04 ENCOUNTER — Other Ambulatory Visit: Payer: Self-pay | Admitting: Family Medicine

## 2016-01-04 DIAGNOSIS — I770 Arteriovenous fistula, acquired: Secondary | ICD-10-CM

## 2016-01-06 ENCOUNTER — Ambulatory Visit (HOSPITAL_COMMUNITY)
Admission: RE | Admit: 2016-01-06 | Discharge: 2016-01-06 | Disposition: A | Discharge status: Home / Self Care | Payer: Self-pay | Source: Ambulatory Visit | Attending: Vascular Surgery | Admitting: Vascular Surgery

## 2016-01-06 ENCOUNTER — Ambulatory Visit (INDEPENDENT_AMBULATORY_CARE_PROVIDER_SITE_OTHER): Payer: Self-pay | Admitting: Vascular Surgery

## 2016-01-06 ENCOUNTER — Encounter: Payer: Self-pay | Admitting: Vascular Surgery

## 2016-01-06 ENCOUNTER — Other Ambulatory Visit: Payer: Self-pay

## 2016-01-06 VITALS — BP 169/101 | HR 61 | Ht 59.0 in | Wt 107.0 lb

## 2016-01-06 DIAGNOSIS — I120 Hypertensive chronic kidney disease with stage 5 chronic kidney disease or end stage renal disease: Secondary | ICD-10-CM | POA: Insufficient documentation

## 2016-01-06 DIAGNOSIS — I770 Arteriovenous fistula, acquired: Secondary | ICD-10-CM | POA: Insufficient documentation

## 2016-01-06 DIAGNOSIS — Z992 Dependence on renal dialysis: Secondary | ICD-10-CM

## 2016-01-06 DIAGNOSIS — N186 End stage renal disease: Secondary | ICD-10-CM

## 2016-01-06 DIAGNOSIS — I259 Chronic ischemic heart disease, unspecified: Secondary | ICD-10-CM | POA: Insufficient documentation

## 2016-01-07 NOTE — Progress Notes (Signed)
Patient is a 30 year old female who only speaks Hispanic. Today's interview was conducted through an interpreter. 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 The graft has been in for several years. She denies any bleeding episode. He states the graft has been functioning well. She denies any numbness or tingling in her foot.  Past Medical History  Diagnosis Date  . Depression     3-4 years ago per husband  . End stage renal disease ([place])   . Hypertension   . Traumatic brain injury ([place])   . Anxiety   . Secondary hyperparathyroidism ([place])   . Iron deficiency   . Chronic ischemic heart disease    Past Surgical History  Procedure Laterality Date  . No past surgeries    . Arteriovenous graft placement     No family history on file.   Current Outpatient Prescriptions on File Prior to Visit  Medication Sig Dispense Refill  . amLODipine (NORVASC) 10 MG tablet Take 10 mg by mouth daily.    . carvedilol (COREG) 25 MG tablet Take 25 mg by mouth daily.     Marland Kitchen FeFum-FePoly-FA-B Cmp-C-Biot (INTEGRA PLUS) CAPS Take 1 capsule by mouth daily. 30 capsule 0  . folic acid (FOLVITE) 1 MG tablet Take 2 mg by mouth daily.     Marland Kitchen lisinopril (PRINIVIL,ZESTRIL) 40 MG tablet Take 40 mg by mouth at bedtime.    . megestrol (MEGACE) 40 MG tablet 40 mg BID x 14 days, then 40 mg daily 30 tablet 0  . multivitamin (RENA-VIT) TABS tablet Take 1 tablet by mouth daily.    . sevelamer carbonate (RENVELA) 800 MG tablet Take 800 mg by mouth 2 (two) times daily.     No current facility-administered medications on file prior to visit.    No Known Allergies    Review of systems: She denies shortness of breath. She denies chest pain.  Physical exam:  Filed Vitals:   01/06/16 1017  BP: 169/101  Pulse: 61  Height: 4\' 11"  (1.499 m)  Weight: 107 lb (48.535 kg)  SpO2: 100%    Chest: Clear to auscultation bilaterally   cardiac: Regular rate and rhythm without  murmur  Extremities: Palpable thrill audible bruit right thigh AV graft 2 segments of pseudoaneurysm degeneration at approximately the 10:00 and 7:00 position of the right thigh graft both of these were thinned out shiny skin but no ulceration some degeneration of the medial limb of the graft but not as severe  2+ right dorsalis pedis pulse  Assessment: Degenerative pseudoaneurysms right thigh AV graft warrants repair to prevent bleeding episode  Plan: Patient is scheduled for revision of the lateral aspect of her thigh graft by Dr. early on 01/18/2016. Risks benefits possible complications and procedure details were discussed with the patient today including but not limited to bleeding infection graft thrombosis she understands and agrees to proceed.  Ruta Hinds, MD [facility] Office: [phone] Pager: [phone]

## 2016-01-17 ENCOUNTER — Encounter (HOSPITAL_COMMUNITY): Payer: Self-pay | Admitting: *Deleted

## 2016-01-17 MED ORDER — DEXTROSE 5 % IV SOLN
1.5000 g | INTRAVENOUS | Status: AC
  Administered 2016-01-18: 1.5 g via INTRAVENOUS
  Filled 2016-01-17: qty 1.5

## 2016-01-17 NOTE — Anesthesia Preprocedure Evaluation (Addendum)
Anesthesia Evaluation  Patient identified by MRN, date of birth, ID band Patient awake    Reviewed: Allergy & Precautions, NPO status , Patient's Chart, lab work & pertinent test results  History of Anesthesia Complications Negative for: history of anesthetic complications  Airway Mallampati: II  TM Distance: >3 FB Neck ROM: Full    Dental no notable dental hx. (+) Dental Advisory Given   Pulmonary neg pulmonary ROS,    Pulmonary exam normal        Cardiovascular hypertension, Pt. on medications Normal cardiovascular exam     Neuro/Psych PSYCHIATRIC DISORDERS Anxiety Depression TBI negative psych ROS   GI/Hepatic negative GI ROS, Neg liver ROS,   Endo/Other  negative endocrine ROS  Renal/GU ESRF and DialysisRenal disease  negative genitourinary   Musculoskeletal negative musculoskeletal ROS (+)   Abdominal   Peds negative pediatric ROS (+)  Hematology negative hematology ROS (+)   Anesthesia Other Findings   Reproductive/Obstetrics negative OB ROS                            Anesthesia Physical Anesthesia Plan  ASA: III  Anesthesia Plan: General   Post-op Pain Management:    Induction: Intravenous  Airway Management Planned: LMA  Additional Equipment:   Intra-op Plan:   Post-operative Plan: Extubation in OR  Informed Consent: I have reviewed the patients History and Physical, chart, labs and discussed the procedure including the risks, benefits and alternatives for the proposed anesthesia with the patient or authorized representative who has indicated his/her understanding and acceptance.   Dental advisory given  Plan Discussed with: Anesthesiologist and CRNA  Anesthesia Plan Comments: (Interpreter was used for the interview)       Anesthesia Quick Evaluation

## 2016-01-17 NOTE — Progress Notes (Addendum)
Pt SDW-pre-op call completed by Dorita Sciara, Kenney Houseman # [phone]. Pt denies SOB and being under the care of a cardiologist but stated that she has been having intermittent chest pain over the last month. Pt stated that the pain lasts for 1-2 hours but has been lasting more that 2 hours over the last week. Pt denies seeking medical attention and advised to do so if needed. Pt denies having a stress test and cardiac cath. Pt denies having an EKG and chest x ray within the last year. Pt stated " no one ever told me that I had any problems with my heart ," when questioned about cardiac history and hospitalization in 2008. Pt stated that she does not take any medications in the morning; she takes her medications in the afternoon and evening. Pt made aware to stop taking otc vitamins, fish oil and herbal medications.  Pt verbalized understanding of all pre-op instructions. Anesthesia to review pt cardiac history.
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
## 2016-01-18 ENCOUNTER — Ambulatory Visit (HOSPITAL_COMMUNITY)
Admission: RE | Admit: 2016-01-18 | Discharge: 2016-01-18 | Disposition: A | Discharge status: Home / Self Care | Payer: Self-pay | Source: Ambulatory Visit | Attending: Vascular Surgery | Admitting: Vascular Surgery

## 2016-01-18 ENCOUNTER — Encounter (HOSPITAL_COMMUNITY)
Admission: RE | Disposition: A | Discharge status: Home / Self Care | Payer: Self-pay | Source: Ambulatory Visit | Attending: Vascular Surgery

## 2016-01-18 ENCOUNTER — Ambulatory Visit (HOSPITAL_COMMUNITY): Payer: Self-pay | Admitting: Vascular Surgery

## 2016-01-18 ENCOUNTER — Encounter (HOSPITAL_COMMUNITY): Payer: Self-pay | Admitting: Certified Registered Nurse Anesthetist

## 2016-01-18 DIAGNOSIS — T82898A Other specified complication of vascular prosthetic devices, implants and grafts, initial encounter: Secondary | ICD-10-CM | POA: Insufficient documentation

## 2016-01-18 DIAGNOSIS — Z992 Dependence on renal dialysis: Secondary | ICD-10-CM | POA: Insufficient documentation

## 2016-01-18 DIAGNOSIS — I120 Hypertensive chronic kidney disease with stage 5 chronic kidney disease or end stage renal disease: Secondary | ICD-10-CM | POA: Insufficient documentation

## 2016-01-18 DIAGNOSIS — Y832 Surgical operation with anastomosis, bypass or graft as the cause of abnormal reaction of the patient, or of later complication, without mention of misadventure at the time of the procedure: Secondary | ICD-10-CM | POA: Insufficient documentation

## 2016-01-18 DIAGNOSIS — N186 End stage renal disease: Secondary | ICD-10-CM | POA: Insufficient documentation

## 2016-01-18 DIAGNOSIS — F419 Anxiety disorder, unspecified: Secondary | ICD-10-CM | POA: Insufficient documentation

## 2016-01-18 DIAGNOSIS — I259 Chronic ischemic heart disease, unspecified: Secondary | ICD-10-CM | POA: Insufficient documentation

## 2016-01-18 DIAGNOSIS — N2581 Secondary hyperparathyroidism of renal origin: Secondary | ICD-10-CM | POA: Insufficient documentation

## 2016-01-18 DIAGNOSIS — Z87820 Personal history of traumatic brain injury: Secondary | ICD-10-CM | POA: Insufficient documentation

## 2016-01-18 HISTORY — PX: REVISION OF ARTERIOVENOUS GORETEX GRAFT: SHX6073

## 2016-01-18 HISTORY — DX: Headache: R51

## 2016-01-18 HISTORY — DX: Headache, unspecified: R51.9

## 2016-01-18 LAB — POCT I-STAT 4, (NA,K, GLUC, HGB,HCT)
Glucose, Bld: 90 mg/dL (ref 65–99)
HEMATOCRIT: 39 % (ref 36.0–46.0)
Hemoglobin: 13.3 g/dL (ref 12.0–15.0)
Potassium: 4.6 mmol/L (ref 3.5–5.1)
SODIUM: 134 mmol/L — AB (ref 135–145)

## 2016-01-18 LAB — HCG, SERUM, QUALITATIVE
PREG SERUM: NEGATIVE
Preg, Serum: NEGATIVE

## 2016-01-18 SURGERY — REVISION OF ARTERIOVENOUS GORETEX GRAFT
Anesthesia: General | Site: Thigh | Laterality: Right

## 2016-01-18 MED ORDER — 0.9 % SODIUM CHLORIDE (POUR BTL) OPTIME
TOPICAL | Status: DC | PRN
  Administered 2016-01-18: 1000 mL

## 2016-01-18 MED ORDER — EPHEDRINE SULFATE-NACL 50-0.9 MG/10ML-% IV SOSY
PREFILLED_SYRINGE | INTRAVENOUS | Status: DC | PRN
  Administered 2016-01-18 (×3): 10 mg via INTRAVENOUS
  Administered 2016-01-18 (×2): 5 mg via INTRAVENOUS

## 2016-01-18 MED ORDER — PROMETHAZINE HCL 25 MG/ML IJ SOLN: 6.2500 mg | INTRAMUSCULAR | Status: DC | PRN

## 2016-01-18 MED ORDER — HYDROMORPHONE HCL 1 MG/ML IJ SOLN
0.2500 mg | INTRAMUSCULAR | Status: DC | PRN
  Administered 2016-01-18: 0.25 mg via INTRAVENOUS
  Administered 2016-01-18: 0.45 mg via INTRAVENOUS
  Administered 2016-01-18: 0.3 mg via INTRAVENOUS

## 2016-01-18 MED ORDER — FENTANYL CITRATE (PF) 250 MCG/5ML IJ SOLN
INTRAMUSCULAR | Status: AC
  Filled 2016-01-18: qty 5

## 2016-01-18 MED ORDER — SCOPOLAMINE 1 MG/3DAYS TD PT72
1.0000 | MEDICATED_PATCH | Freq: Once | TRANSDERMAL | Status: DC
  Administered 2016-01-18: 1.5 mg via TRANSDERMAL

## 2016-01-18 MED ORDER — SODIUM CHLORIDE 0.9 % IV SOLN
INTRAVENOUS | Status: DC | PRN
  Administered 2016-01-18: 500 mL

## 2016-01-18 MED ORDER — PHENYLEPHRINE HCL 10 MG/ML IJ SOLN
10.0000 mg | INTRAVENOUS | Status: DC | PRN
  Administered 2016-01-18: 25 ug/min via INTRAVENOUS

## 2016-01-18 MED ORDER — HYDROMORPHONE HCL 1 MG/ML IJ SOLN
INTRAMUSCULAR | Status: AC
  Filled 2016-01-18: qty 1

## 2016-01-18 MED ORDER — LIDOCAINE 2% (20 MG/ML) 5 ML SYRINGE
INTRAMUSCULAR | Status: DC | PRN
  Administered 2016-01-18: 80 mg via INTRAVENOUS

## 2016-01-18 MED ORDER — CHLORHEXIDINE GLUCONATE CLOTH 2 % EX PADS: 6.0000 | MEDICATED_PAD | Freq: Once | CUTANEOUS | Status: DC

## 2016-01-18 MED ORDER — PHENYLEPHRINE 40 MCG/ML (10ML) SYRINGE FOR IV PUSH (FOR BLOOD PRESSURE SUPPORT)
PREFILLED_SYRINGE | INTRAVENOUS | Status: DC | PRN
  Administered 2016-01-18: 40 ug via INTRAVENOUS
  Administered 2016-01-18: 80 ug via INTRAVENOUS

## 2016-01-18 MED ORDER — MIDAZOLAM HCL 5 MG/5ML IJ SOLN
INTRAMUSCULAR | Status: DC | PRN
  Administered 2016-01-18: 1 mg via INTRAVENOUS

## 2016-01-18 MED ORDER — MIDAZOLAM HCL 2 MG/2ML IJ SOLN
INTRAMUSCULAR | Status: AC
  Filled 2016-01-18: qty 2

## 2016-01-18 MED ORDER — PROPOFOL 10 MG/ML IV BOLUS
INTRAVENOUS | Status: AC
  Filled 2016-01-18: qty 40

## 2016-01-18 MED ORDER — ONDANSETRON HCL 4 MG/2ML IJ SOLN
INTRAMUSCULAR | Status: DC | PRN
  Administered 2016-01-18: 4 mg via INTRAVENOUS

## 2016-01-18 MED ORDER — FENTANYL CITRATE (PF) 100 MCG/2ML IJ SOLN
INTRAMUSCULAR | Status: DC | PRN
  Administered 2016-01-18 (×2): 25 ug via INTRAVENOUS
  Administered 2016-01-18: 50 ug via INTRAVENOUS

## 2016-01-18 MED ORDER — SODIUM CHLORIDE 0.9 % IV SOLN
INTRAVENOUS | Status: DC
  Administered 2016-01-18: 07:00:00 via INTRAVENOUS

## 2016-01-18 MED ORDER — PROPOFOL 10 MG/ML IV BOLUS
INTRAVENOUS | Status: DC | PRN
  Administered 2016-01-18: 160 mg via INTRAVENOUS

## 2016-01-18 MED ORDER — CARVEDILOL 25 MG PO TABS
25.0000 mg | ORAL_TABLET | Freq: Once | ORAL | Status: DC
  Filled 2016-01-18: qty 1

## 2016-01-18 MED ORDER — OXYCODONE-ACETAMINOPHEN 5-325 MG PO TABS: 1.0000 | ORAL_TABLET | Freq: Four times a day (QID) | ORAL | Status: DC | PRN

## 2016-01-18 MED ORDER — SCOPOLAMINE 1 MG/3DAYS TD PT72
MEDICATED_PATCH | TRANSDERMAL | Status: AC
  Filled 2016-01-18: qty 1

## 2016-01-18 SURGICAL SUPPLY — 40 items
APL SKNCLS STERI-STRIP NONHPOA (GAUZE/BANDAGES/DRESSINGS) ×1
BENZOIN TINCTURE PRP APPL 2/3 (GAUZE/BANDAGES/DRESSINGS) ×2 IMPLANT
CANISTER SUCTION 2500CC (MISCELLANEOUS) ×2 IMPLANT
CANNULA VESSEL 3MM 2 BLNT TIP (CANNULA) IMPLANT
CLIP LIGATING EXTRA MED SLVR (CLIP) ×2 IMPLANT
CLIP LIGATING EXTRA SM BLUE (MISCELLANEOUS) ×2 IMPLANT
DECANTER SPIKE VIAL GLASS SM (MISCELLANEOUS) ×2 IMPLANT
DRAPE ORTHO SPLIT 77X108 STRL (DRAPES) ×2
DRAPE SURG ORHT 6 SPLT 77X108 (DRAPES) IMPLANT
ELECT REM PT RETURN 9FT ADLT (ELECTROSURGICAL) ×2
ELECTRODE REM PT RTRN 9FT ADLT (ELECTROSURGICAL) ×1 IMPLANT
GAUZE SPONGE 4X4 12PLY STRL (GAUZE/BANDAGES/DRESSINGS) ×2 IMPLANT
GEL ULTRASOUND 20GR AQUASONIC (MISCELLANEOUS) IMPLANT
GLOVE BIO SURGEON STRL SZ 6.5 (GLOVE) ×2 IMPLANT
GLOVE BIO SURGEON STRL SZ7 (GLOVE) ×1 IMPLANT
GLOVE BIOGEL PI IND STRL 6.5 (GLOVE) IMPLANT
GLOVE BIOGEL PI IND STRL 8.5 (GLOVE) IMPLANT
GLOVE BIOGEL PI INDICATOR 6.5 (GLOVE) ×3
GLOVE BIOGEL PI INDICATOR 8.5 (GLOVE) ×1
GLOVE SS BIOGEL STRL SZ 7.5 (GLOVE) ×1 IMPLANT
GLOVE SUPERSENSE BIOGEL SZ 7.5 (GLOVE) ×1
GOWN STRL REUS W/ TWL LRG LVL3 (GOWN DISPOSABLE) ×3 IMPLANT
GOWN STRL REUS W/TWL LRG LVL3 (GOWN DISPOSABLE) ×6
GRAFT GORETEX 6X40 (Vascular Products) ×1 IMPLANT
KIT BASIN OR (CUSTOM PROCEDURE TRAY) ×2 IMPLANT
KIT ROOM TURNOVER OR (KITS) ×2 IMPLANT
NDL HYPO 25GX1X1/2 BEV (NEEDLE) ×1 IMPLANT
NEEDLE HYPO 25GX1X1/2 BEV (NEEDLE) ×2 IMPLANT
NS IRRIG 1000ML POUR BTL (IV SOLUTION) ×2 IMPLANT
PACK CV ACCESS (CUSTOM PROCEDURE TRAY) ×2 IMPLANT
PAD ARMBOARD 7.5X6 YLW CONV (MISCELLANEOUS) ×4 IMPLANT
SPONGE GAUZE 4X4 12PLY STER LF (GAUZE/BANDAGES/DRESSINGS) ×1 IMPLANT
STRIP CLOSURE SKIN 1/2X4 (GAUZE/BANDAGES/DRESSINGS) ×2 IMPLANT
SUT PROLENE 6 0 CC (SUTURE) ×4 IMPLANT
SUT SILK 2 0 FS (SUTURE) IMPLANT
SUT VIC AB 3-0 SH 27 (SUTURE) ×4
SUT VIC AB 3-0 SH 27X BRD (SUTURE) ×2 IMPLANT
TAPE CLOTH SURG 4X10 WHT LF (GAUZE/BANDAGES/DRESSINGS) ×1 IMPLANT
UNDERPAD 30X30 INCONTINENT (UNDERPADS AND DIAPERS) ×2 IMPLANT
WATER STERILE IRR 1000ML POUR (IV SOLUTION) ×2 IMPLANT

## 2016-01-18 NOTE — Anesthesia Procedure Notes (Signed)
Procedure Name: LMA Insertion Date/Time: 01/18/2016 7:44 AM Performed by: Bethel Born Pre-anesthesia Checklist: Patient identified, Timeout performed, Emergency Drugs available, Suction available and Patient being monitored Patient Re-evaluated:Patient Re-evaluated prior to inductionOxygen Delivery Method: Circle system utilized Preoxygenation: Pre-oxygenation with 100% oxygen Intubation Type: IV induction Ventilation: Mask ventilation without difficulty LMA: LMA inserted LMA Size: 4.0 Number of attempts: 1 Tube secured with: Tape Dental Injury: Teeth and Oropharynx as per pre-operative assessment

## 2016-01-18 NOTE — Op Note (Signed)
    OPERATIVE REPORT  DATE OF SURGERY: 01/18/2016  PATIENT: Katie Lyons, 30 y.o. female MRN: [id number]  DOB: [birth year]  PRE-OPERATIVE DIAGNOSIS: Stage renal disease with aneurysmal degeneration of lateral arterial cath of right femoral AV Gore-Tex graft  POST-OPERATIVE DIAGNOSIS:  Same  PROCEDURE: Replacement of lateral half of right femoral loop AV Gore-Tex graft with 6 mm standard wall Gore-Tex graft  SURGEON:  Curt Jews, M.D.  PHYSICIAN ASSISTANT: Silva Bandy PA-C  ANESTHESIA:  Gen.  EBL: Less than 100 ml  Total I/O In: -  Out: 50 [Blood:50]  BLOOD ADMINISTERED: None  DRAINS: None  SPECIMEN: None  COUNTS CORRECT:  YES  PLAN OF CARE: PACU   PATIENT DISPOSITION:  PACU - hemodynamically stable  PROCEDURE DETAILS: The patient was taken to the operative placed supine position where the area of the right groin and right thigh were prepped and draped in usual sterile fashion. Incision was made near the arterial anastomosis over the Gore-Tex graft. The graft was encircled with a vessel loop. Next a separate incision was made over the graft at the apex of the loop on the distal thigh. Again the graft was encircled with a vessel loop. The graft was occluded at the incision at the groin and also the incision over the distal apex. The graft was transected both these places the graft itself was flushed with heparinized saline and reoccluded. The old non-functional graft was resected both of these incisions and a tunnel was created lateral to the old arterial limb of the graft. A 6 mm standard wall Gore-Tex graft was brought through the tunnel. The graft was sewn into into the old graft near the arterial anastomosis with a 6-0 running Prolene suture. This anastomosis was tested and found to be adequate. The graft was flushed with heparinized saline and reoccluded. The graft was cut to appropriate length and was sewn into into the old graft at the apex of the loop with a  running 6-0 Prolene suture. After the usual flushing maneuvers the anastomosis was completed and clamps removed with excellent thrill noted to the graft. Wounds irrigated with saline. Hemostasis was obtained left cautery. Closed with 3-0 Vicryl in the subcutaneous and subcuticular tissue. Benzoin and Steri-Strips were applied and the patient was transferred to the recovery room stable condition. Patient had 2+ dorsalis pedis pulses bilaterally   Curt Jews, M.D. 01/18/2016 9:25 AM

## 2016-01-18 NOTE — H&P (View-Only) (Signed)
Patient is a 30 year old female who only speaks Hispanic. Today's interview was conducted through an interpreter. 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 The graft has been in for several years. She denies any bleeding episode. He states the graft has been functioning well. She denies any numbness or tingling in her foot.  Past Medical History  Diagnosis Date  . Depression     3-4 years ago per husband  . End stage renal disease ([place])   . Hypertension   . Traumatic brain injury ([place])   . Anxiety   . Secondary hyperparathyroidism ([place])   . Iron deficiency   . Chronic ischemic heart disease    Past Surgical History  Procedure Laterality Date  . No past surgeries    . Arteriovenous graft placement     No family history on file.   Current Outpatient Prescriptions on File Prior to Visit  Medication Sig Dispense Refill  . amLODipine (NORVASC) 10 MG tablet Take 10 mg by mouth daily.    . carvedilol (COREG) 25 MG tablet Take 25 mg by mouth daily.     Marland Kitchen FeFum-FePoly-FA-B Cmp-C-Biot (INTEGRA PLUS) CAPS Take 1 capsule by mouth daily. 30 capsule 0  . folic acid (FOLVITE) 1 MG tablet Take 2 mg by mouth daily.     Marland Kitchen lisinopril (PRINIVIL,ZESTRIL) 40 MG tablet Take 40 mg by mouth at bedtime.    . megestrol (MEGACE) 40 MG tablet 40 mg BID x 14 days, then 40 mg daily 30 tablet 0  . multivitamin (RENA-VIT) TABS tablet Take 1 tablet by mouth daily.    . sevelamer carbonate (RENVELA) 800 MG tablet Take 800 mg by mouth 2 (two) times daily.     No current facility-administered medications on file prior to visit.    No Known Allergies    Review of systems: She denies shortness of breath. She denies chest pain.  Physical exam:  Filed Vitals:   01/06/16 1017  BP: 169/101  Pulse: 61  Height: 4\' 11"  (1.499 m)  Weight: 107 lb (48.535 kg)  SpO2: 100%    Chest: Clear to auscultation bilaterally   cardiac: Regular rate and rhythm without  murmur  Extremities: Palpable thrill audible bruit right thigh AV graft 2 segments of pseudoaneurysm degeneration at approximately the 10:00 and 7:00 position of the right thigh graft both of these were thinned out shiny skin but no ulceration some degeneration of the medial limb of the graft but not as severe  2+ right dorsalis pedis pulse  Assessment: Degenerative pseudoaneurysms right thigh AV graft warrants repair to prevent bleeding episode  Plan: Patient is scheduled for revision of the lateral aspect of her thigh graft by Dr. early on 01/18/2016. Risks benefits possible complications and procedure details were discussed with the patient today including but not limited to bleeding infection graft thrombosis she understands and agrees to proceed.  Ruta Hinds, MD [facility] Office: [phone] Pager: [phone]

## 2016-01-18 NOTE — Interval H&P Note (Signed)
History and Physical Interval Note:  01/18/2016 6:59 AM  Katie Lyons  has presented today for surgery, with the diagnosis of End Stage Renal Disease N18.6; Aneurysmal right thigh arteriovenous graft I72.9  The various methods of treatment have been discussed with the patient and family. After consideration of risks, benefits and other options for treatment, the patient has consented to  Procedure(s): REVISION OF ARTERIOVENOUS GORETEX GRAFT (Right) as a surgical intervention .  The patient's history has been reviewed, patient examined, no change in status, stable for surgery.  I have reviewed the patient's chart and labs.  Questions were answered to the patient's satisfaction.     Curt Jews

## 2016-01-18 NOTE — Transfer of Care (Signed)
Immediate Anesthesia Transfer of Care Note  Patient: Katie Lyons  Procedure(s) Performed: Procedure(s): REVISION OF ARTERIOVENOUS GORETEX GRAFT Right leg (Right)  Patient Location: PACU  Anesthesia Type:General  Level of Consciousness: awake, alert , oriented and patient cooperative  Airway & Oxygen Therapy: Patient Spontanous Breathing and Patient connected to nasal cannula oxygen  Post-op Assessment: Report given to RN and Post -op Vital signs reviewed and stable  Post vital signs: Reviewed and stable  Last Vitals:  Filed Vitals:   01/18/16 0612 01/18/16 0705  BP: 131/89   Pulse: 62 59  Temp: 36.7 C   Resp: 18     Last Pain:  Filed Vitals:   01/18/16 0923  PainSc: 6       Patients Stated Pain Goal: 2 ([id number])  Complications: No apparent anesthesia complications

## 2016-01-18 NOTE — Anesthesia Postprocedure Evaluation (Signed)
Anesthesia Post Note  Patient: Katie Lyons  Procedure(s) Performed: Procedure(s) (LRB): REVISION OF ARTERIOVENOUS GORETEX GRAFT Right leg (Right)  Patient location during evaluation: PACU Anesthesia Type: General Level of consciousness: sedated Pain management: pain level controlled Vital Signs Assessment: post-procedure vital signs reviewed and stable Respiratory status: spontaneous breathing and respiratory function stable Cardiovascular status: stable Anesthetic complications: no    Last Vitals:  Filed Vitals:   01/18/16 1030 01/18/16 1045  BP: 126/82 139/93  Pulse: 65 65  Temp:    Resp: 11     Last Pain:  Filed Vitals:   01/18/16 1049  PainSc: 2                  Kalee Mcclenathan DANIEL

## 2016-01-19 ENCOUNTER — Encounter (HOSPITAL_COMMUNITY): Payer: Self-pay | Admitting: Vascular Surgery

## 2016-02-03 ENCOUNTER — Ambulatory Visit: Payer: Self-pay | Admitting: Family

## 2016-02-03 ENCOUNTER — Ambulatory Visit (INDEPENDENT_AMBULATORY_CARE_PROVIDER_SITE_OTHER): Payer: Self-pay | Admitting: Family

## 2016-02-03 ENCOUNTER — Encounter: Payer: Self-pay | Admitting: Family

## 2016-02-03 VITALS — BP 168/92 | HR 74 | Temp 97.2°F | Resp 16 | Ht 59.0 in | Wt 106.0 lb

## 2016-02-03 DIAGNOSIS — I729 Aneurysm of unspecified site: Secondary | ICD-10-CM

## 2016-02-03 DIAGNOSIS — R7881 Bacteremia: Secondary | ICD-10-CM

## 2016-02-03 DIAGNOSIS — T82590D Other mechanical complication of surgically created arteriovenous fistula, subsequent encounter: Secondary | ICD-10-CM

## 2016-02-03 DIAGNOSIS — Z992 Dependence on renal dialysis: Secondary | ICD-10-CM

## 2016-02-03 DIAGNOSIS — N186 End stage renal disease: Secondary | ICD-10-CM

## 2016-02-03 DIAGNOSIS — T82510D Breakdown (mechanical) of surgically created arteriovenous fistula, subsequent encounter: Secondary | ICD-10-CM

## 2016-02-03 NOTE — Progress Notes (Signed)
    Postoperative Access Visit   History of Present Illness  Katie Lyons is a 30 y.o. year old female who only speaks Spanish.  Dr. [place] Alar and Dr. Scot Dock have seen pt in the office in 2011 and again in May of 2017. Today's interview was conducted through an interpreter.  She has had her right upper thigh AV graft for about 6 years, per pt.  She is s/p replacement of lateral half of right femoral loop AV Gore-Tex graft with 6 mm standard wall Gore-Tex graft on 01/18/16 by Dr. Donnetta Hutching for ESRD with aneurysmal degeneration of lateral arterial portion of right femoral AV Gore-Tex graft.  She had fever and chills last Friday (6 days ago), but none since then. She started Vancomycin during HD last Friday. Pt denies any drainage form the incision.   Pt returns today at the request of Dr. Pearson Grippe who is treating pt with Vancomycin for bacteremia. Records accompanying pt indicate that pt had BC X2 from 01/28/16 with [facility]PC. Pt dialyzes M-W-F  The patient's right upper thigh AV graft incision is healed.  The patient notes no steal symptoms in her right lower leg or foot.  The patient is able to complete their activities of daily living.  The patient's current symptoms are: none.  For VQI Use Only   PRE-ADM LIVING: Home  AMB STATUS: Ambulatory  Physical Examination Filed Vitals:   02/03/16 1416  BP: 168/92  Pulse: 74  Temp: 97.2 F (36.2 C)  TempSrc: Oral  Resp: 16  Height: 4\' 11"  (1.499 m)  Weight: 106 lb (48.081 kg)  SpO2: 100%   Body mass index is 21.4 kg/(m^2).  The right upper thigh incision is well healed with no swelling, no erythema, no drainage. Right lower leg is normal warm to touch, palpable right pedal pulses. Sensation in right toes is intact., palpable thrill.  Medical Decision Making  Katie Lyons is a 30 y.o. year old female who presents s/p replacement of lateral half of right femoral loop AV Gore-Tex graft with 6 mm standard wall Gore-Tex  graft on 01/18/16 by Dr. Donnetta Hutching for ESRD with aneurysmal degeneration of lateral arterial portion of right femoral AV Gore-Tex graft.  Dr. [place] Alar tried to speak with Dr. Pearson Grippe, paged him, awaiting return call. Dr. [place] Alar recommends continuing the Vancomycin, continue using the AV graft for HD. Dr. [place] Alar spoke with and examined pt and does not think the graft appears infected, pt feels well, no longer has fever and chills, the AV graft is working for HD.  Follow up with Dr. Donnetta Hutching in 2 weeks for AV graft incision re-evaluation.  Thank you for allowing [place] to participate in this patient's care.  NICKEL, Sharmon Leyden, RN, MSN, FNP-C [facility] Office: [phone]  02/03/2016, 2:21 PM  Clinic MD: [place] Alar

## 2016-02-08 ENCOUNTER — Ambulatory Visit (HOSPITAL_COMMUNITY)
Admission: RE | Admit: 2016-02-08 | Discharge: 2016-02-08 | Disposition: A | Discharge status: Home / Self Care | Payer: Self-pay | Source: Ambulatory Visit | Attending: Nephrology | Admitting: Nephrology

## 2016-02-08 DIAGNOSIS — D649 Anemia, unspecified: Secondary | ICD-10-CM | POA: Insufficient documentation

## 2016-02-08 LAB — HEMOGLOBIN AND HEMATOCRIT, BLOOD
HCT: 25.1 % — ABNORMAL LOW (ref 36.0–46.0)
Hemoglobin: 8 g/dL — ABNORMAL LOW (ref 12.0–15.0)

## 2016-02-08 LAB — ABO/RH: ABO/RH(D): O POS

## 2016-02-08 MED ORDER — ACETAMINOPHEN 325 MG PO TABS: 650.0000 mg | ORAL_TABLET | Freq: Once | ORAL | Status: DC

## 2016-02-08 MED ORDER — DIPHENHYDRAMINE HCL 25 MG PO CAPS: 25.0000 mg | ORAL_CAPSULE | Freq: Once | ORAL | Status: DC

## 2016-02-08 NOTE — Progress Notes (Signed)
Dx: Symptomatic Anemia  Provider: Dr. Pearson Grippe  Procedure: Pt was typed and crossmatched for 2 units of blood  Pt tolerated procedure well.  Post procedure: Pt alert, oriented and ambulatory at discharge. Reminded to keep blue blood bracelet on and to return for blood transfusion on Thursday February 10, 2016. Pt voiced understanding thru translation.

## 2016-02-08 NOTE — Progress Notes (Signed)
Pt returned to [facility] without checking with her MD. [facility] notified that patient had returned and we let them know that we can draw her type and cross match today and transfuse her on this Thursday. Pt is scheduled for dialysis on Wednesday of this week. We checked with Dr. Marylou Flesher to see if she could wait and he was ok with Thursday. Also order was given to check hgb today. The video interpreter was used to instruct patient on type and cross matching and when to come back for her blood. Pt advised not to take off blue blood bracelet. Pt voiced understanding.

## 2016-02-08 NOTE — Progress Notes (Signed)
Pt received to the [facility] for possible blood transfusion. Patient does not speak english, so the interpreter was notified. Dr. Etheleen Nicks office was notified that patient was here and did not have orders for her. That office stated that we needed to contact the [facility] regarding her. The [facility] was notified that patient is here for possible blood transfusion and they stated that they would fax orders for her to the medical center.  After waiting for approximately 15 mins patient stated that she had a dental appointment and that she needed to go, per daughter's translation. Pt was told that she needed to contact the doctor and let them know if she needed to make an appointment for the blood transfusion. Daughter translated and patient voiced understanding. The [facility] was notified regarding patient's decision to leave without receiving any treatment.

## 2016-02-09 ENCOUNTER — Encounter (HOSPITAL_COMMUNITY): Payer: Self-pay

## 2016-02-10 ENCOUNTER — Ambulatory Visit (HOSPITAL_COMMUNITY)
Admission: RE | Admit: 2016-02-10 | Discharge: 2016-02-10 | Disposition: A | Discharge status: Home / Self Care | Payer: Self-pay | Source: Ambulatory Visit | Attending: Nephrology | Admitting: Nephrology

## 2016-02-10 LAB — PREPARE RBC (CROSSMATCH)

## 2016-02-10 MED ORDER — SODIUM CHLORIDE 0.9 % IV SOLN
Freq: Once | INTRAVENOUS | Status: AC
  Administered 2016-02-10: 10:00:00 via INTRAVENOUS

## 2016-02-10 MED ORDER — DIPHENHYDRAMINE HCL 25 MG PO CAPS
25.0000 mg | ORAL_CAPSULE | Freq: Once | ORAL | Status: AC
  Administered 2016-02-10: 25 mg via ORAL
  Filled 2016-02-10: qty 1

## 2016-02-10 MED ORDER — ACETAMINOPHEN 325 MG PO TABS
650.0000 mg | ORAL_TABLET | Freq: Once | ORAL | Status: AC
  Administered 2016-02-10: 650 mg via ORAL
  Filled 2016-02-10: qty 2

## 2016-02-10 NOTE — Discharge Instructions (Signed)
Transfusin de sangre  (Blood Transfusion) Katie Lyons transfusin de sangre es un procedimiento en el que se administra sangre donada a travs de una va intravenosa. Puede necesitar sangre por una enfermedad, ciruga o lesin. La sangre puede provenir de un donante. Tambin puede ser su propia sangre que haya donado previamente. La sangre que recibe se compone de diferentes tipos de clulas. Puede recibir lo siguiente:   Glbulos rojos. Estos transportan oxgeno y Optician, dispensing perdida.   Plaquetas. Estas controlan el sangrado.   Plasma. Este ayuda a la coagulacin sangunea. Si tiene un trastorno de Proofreader, tambin puede recibir otro tipo de hemoderivados.  ANTES DEL PROCEDIMIENTO  Posiblemente deba realizarse un anlisis de sangre para determinar qu tipo de [place] tiene y qu clase de sangre aceptar el cuerpo.   Si planifica someterse a [place], puede donar su propia sangre. Esto es en caso de que necesite una transfusin.   Si ha tenido Nurse, mental health a una transfusin en el pasado, es posible que reciba medicamentos para ayudar a Conservation officer, historic buildings. Tome los medicamentos solamente como se lo haya indicado el mdico.  Le controlarn la temperatura, la presin arterial y el pulso. PROCEDIMIENTO   Le insertarn una va intravenosa en el brazo o la [place].   La bolsa de sangre donada se conectar a la va intravenosa y Pharmacist, community en la vena.   Un mdico le controlar con frecuencia la temperatura, la presin arterial y el pulso durante el procedimiento. Esto se realiza para detectar signos tempranos de una reaccin a la transfusin.  Si tiene signos o sntomas de una reaccin, es posible que se suspenda el procedimiento y Psychiatric nurse administren medicamentos.   Cuando finalice la transfusin, le retirarn la va intravenosa.   Se puede aplicar presin en el lugar donde se coloc la va intravenosa.   Le colocarn una venda (vendaje).  Este  procedimiento puede variar segn el mdico y el hospital.  DESPUS DEL PROCEDIMIENTO  Le controlarn con frecuencia la temperatura, la presin arterial y el pulso.   Esta informacin no tiene Marine scientist el consejo del mdico. Asegrese de hacerle al mdico cualquier pregunta que tenga.   Document Released: 09/02/2010 Document Revised: 08/21/2014 Elsevier Interactive Patient Education [employer].

## 2016-02-10 NOTE — Progress Notes (Signed)
Pt's b/p elevated, [facility] notified. Pt's b/p after the blood infusion was 193/99 after checking it manually. Tina,RN states pt's b/p is usually up and just to remind patient to take b/p med as soon as she gets home. This was translated to patient per daughter and pt stated she would.

## 2016-02-10 NOTE — Progress Notes (Signed)
Dx: Symptomatic Anemia  Provider: Dr. Pearson Grippe  Procedure: Peripheral IV was started and pt received 2 units of PRBCs after calling the interpreter. The interpreter explained to patient the procedure and asked her if she had ever had blood before or any reactions with blood. Pt said she had never had blood before, thus no reactions. So we proceeded to transfuse blood as ordered.  Pt tolerated procedure well.  Post procedure: Pt alert, oriented and ambulatory at discharge. Pt's b/p was elevated and [facility] was notified and reminded patient to take b/p meds as soon as she gets home.

## 2016-02-11 ENCOUNTER — Encounter: Payer: Self-pay | Admitting: Vascular Surgery

## 2016-02-11 LAB — TYPE AND SCREEN
ABO/RH(D): O POS
Antibody Screen: NEGATIVE
UNIT DIVISION: 0
Unit division: 0

## 2016-02-22 ENCOUNTER — Ambulatory Visit (INDEPENDENT_AMBULATORY_CARE_PROVIDER_SITE_OTHER): Payer: Self-pay | Admitting: Vascular Surgery

## 2016-02-22 ENCOUNTER — Encounter: Payer: Self-pay | Admitting: Vascular Surgery

## 2016-02-22 VITALS — BP 171/102 | HR 63 | Temp 97.8°F | Resp 16 | Ht 59.0 in | Wt 107.0 lb

## 2016-02-22 DIAGNOSIS — T82590D Other mechanical complication of surgically created arteriovenous fistula, subsequent encounter: Secondary | ICD-10-CM

## 2016-02-22 DIAGNOSIS — T82510D Breakdown (mechanical) of surgically created arteriovenous fistula, subsequent encounter: Secondary | ICD-10-CM

## 2016-02-22 NOTE — Progress Notes (Signed)
Here today for follow-up of placement of lateral anterior segment of her right femoral loop AV Gore-Tex graft. This was on 01/18/2016. There was concern regarding possible graft infection she was place without antibiotics and was seen in our office in 2 weeks ago. At that time there was no evidence of wound breakdown or erythema or fluctuance. She is seen today for continued follow-up  Her incisions are completely healed. There is no fluctuance and she has an excellent thrill. She is using the medial venous limb of the graft only currently. The plan had been for this to be used within the venous medial segment for 2 months and then use the entire graft. Any fever and has no discomfort  I do not see any evidence of infection. Her wounds are healing quite nicely. She will continue her dialysis is currently being done. I do not see any indication for continued vancomycin. She will discuss this with the nephrologist. I do not have access to any lab work which might indicate ongoing antibiotic use. She will see [place] on an as-needed basis

## 2016-04-07 ENCOUNTER — Emergency Department (HOSPITAL_COMMUNITY)
Admission: EM | Admit: 2016-04-07 | Discharge: 2016-04-07 | Disposition: A | Discharge status: Home / Self Care | Payer: Self-pay | Attending: Emergency Medicine | Admitting: Emergency Medicine

## 2016-04-07 ENCOUNTER — Encounter (HOSPITAL_COMMUNITY): Payer: Self-pay

## 2016-04-07 ENCOUNTER — Emergency Department (HOSPITAL_COMMUNITY): Payer: Self-pay

## 2016-04-07 DIAGNOSIS — G43909 Migraine, unspecified, not intractable, without status migrainosus: Secondary | ICD-10-CM | POA: Insufficient documentation

## 2016-04-07 DIAGNOSIS — I120 Hypertensive chronic kidney disease with stage 5 chronic kidney disease or end stage renal disease: Secondary | ICD-10-CM | POA: Insufficient documentation

## 2016-04-07 DIAGNOSIS — G43009 Migraine without aura, not intractable, without status migrainosus: Secondary | ICD-10-CM

## 2016-04-07 DIAGNOSIS — Z992 Dependence on renal dialysis: Secondary | ICD-10-CM | POA: Insufficient documentation

## 2016-04-07 DIAGNOSIS — N186 End stage renal disease: Secondary | ICD-10-CM | POA: Insufficient documentation

## 2016-04-07 LAB — BASIC METABOLIC PANEL
Anion gap: 9 (ref 5–15)
BUN: <5 mg/dL — ABNORMAL LOW (ref 6–20)
CALCIUM: 8.3 mg/dL — AB (ref 8.9–10.3)
CO2: 36 mmol/L — ABNORMAL HIGH (ref 22–32)
CREATININE: 2.6 mg/dL — AB (ref 0.44–1.00)
Chloride: 91 mmol/L — ABNORMAL LOW (ref 101–111)
GFR calc non Af Amer: 23 mL/min — ABNORMAL LOW (ref >60)
GFR, EST AFRICAN AMERICAN: 27 mL/min — AB (ref >60)
Glucose, Bld: 107 mg/dL — ABNORMAL HIGH (ref 65–99)
Potassium: 3.1 mmol/L — ABNORMAL LOW (ref 3.5–5.1)
SODIUM: 136 mmol/L (ref 135–145)

## 2016-04-07 LAB — CBC
HCT: 23.3 % — ABNORMAL LOW (ref 36.0–46.0)
Hemoglobin: 7.5 g/dL — ABNORMAL LOW (ref 12.0–15.0)
MCH: 30.2 pg (ref 26.0–34.0)
MCHC: 32.2 g/dL (ref 30.0–36.0)
MCV: 94 fL (ref 78.0–100.0)
PLATELETS: 165 10*3/uL (ref 150–400)
RBC: 2.48 MIL/uL — AB (ref 3.87–5.11)
RDW: 14.4 % (ref 11.5–15.5)
WBC: 5.5 10*3/uL (ref 4.0–10.5)

## 2016-04-07 LAB — I-STAT TROPONIN, ED: TROPONIN I, POC: 0 ng/mL (ref 0.00–0.08)

## 2016-04-07 MED ORDER — TRAMADOL HCL 50 MG PO TABS: ORAL_TABLET | ORAL | 0 refills | Status: DC

## 2016-04-07 MED ORDER — OXYCODONE-ACETAMINOPHEN 5-325 MG PO TABS
1.0000 | ORAL_TABLET | Freq: Once | ORAL | Status: AC
  Administered 2016-04-07: 1 via ORAL
  Filled 2016-04-07: qty 1

## 2016-04-07 MED ORDER — POTASSIUM CHLORIDE CRYS ER 20 MEQ PO TBCR
40.0000 meq | EXTENDED_RELEASE_TABLET | Freq: Once | ORAL | Status: AC
  Administered 2016-04-07: 40 meq via ORAL
  Filled 2016-04-07: qty 2

## 2016-04-07 NOTE — Discharge Instructions (Signed)
Follow-up with your doctor this week for recheck. 

## 2016-04-07 NOTE — ED Triage Notes (Signed)
Pt MWF dialysis pt, had HD today, received full treatment. Pt reports CP and headache X4 days. Pt was also told that her hgb is low. No distress noted.

## 2016-04-07 NOTE — ED Notes (Signed)
Patient Alert and oriented X4. Stable and ambulatory. Patient verbalized understanding of the discharge instructions.  Patient belongings were taken by the patient.  
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
## 2016-04-10 ENCOUNTER — Inpatient Hospital Stay (HOSPITAL_COMMUNITY): Payer: Medicaid Other | Admitting: Certified Registered Nurse Anesthetist

## 2016-04-10 ENCOUNTER — Encounter (HOSPITAL_COMMUNITY)
Admission: EM | Disposition: A | Discharge status: Home / Self Care | Payer: Self-pay | Source: Home / Self Care | Attending: Oncology

## 2016-04-10 ENCOUNTER — Inpatient Hospital Stay (HOSPITAL_COMMUNITY): Payer: Medicaid Other

## 2016-04-10 ENCOUNTER — Emergency Department (HOSPITAL_COMMUNITY): Admit: 2016-04-10 | Discharge: 2016-04-10 | Disposition: A | Discharge status: Home / Self Care | Payer: Medicaid Other

## 2016-04-10 ENCOUNTER — Emergency Department (HOSPITAL_COMMUNITY): Payer: Medicaid Other

## 2016-04-10 ENCOUNTER — Encounter (HOSPITAL_COMMUNITY): Payer: Self-pay

## 2016-04-10 ENCOUNTER — Inpatient Hospital Stay (HOSPITAL_COMMUNITY)
Admission: EM | Admit: 2016-04-10 | Discharge: 2016-04-15 | DRG: 252 | Disposition: A | Discharge status: Home / Self Care | Payer: Medicaid Other | Attending: Internal Medicine | Admitting: Internal Medicine

## 2016-04-10 DIAGNOSIS — A408 Other streptococcal sepsis: Secondary | ICD-10-CM | POA: Diagnosis present

## 2016-04-10 DIAGNOSIS — T827XXA Infection and inflammatory reaction due to other cardiac and vascular devices, implants and grafts, initial encounter: Secondary | ICD-10-CM | POA: Diagnosis present

## 2016-04-10 DIAGNOSIS — N2581 Secondary hyperparathyroidism of renal origin: Secondary | ICD-10-CM | POA: Diagnosis present

## 2016-04-10 DIAGNOSIS — I259 Chronic ischemic heart disease, unspecified: Secondary | ICD-10-CM | POA: Diagnosis present

## 2016-04-10 DIAGNOSIS — Z79899 Other long term (current) drug therapy: Secondary | ICD-10-CM | POA: Diagnosis not present

## 2016-04-10 DIAGNOSIS — Z419 Encounter for procedure for purposes other than remedying health state, unspecified: Secondary | ICD-10-CM

## 2016-04-10 DIAGNOSIS — Z992 Dependence on renal dialysis: Secondary | ICD-10-CM | POA: Diagnosis not present

## 2016-04-10 DIAGNOSIS — Y712 Prosthetic and other implants, materials and accessory cardiovascular devices associated with adverse incidents: Secondary | ICD-10-CM

## 2016-04-10 DIAGNOSIS — A419 Sepsis, unspecified organism: Secondary | ICD-10-CM | POA: Diagnosis not present

## 2016-04-10 DIAGNOSIS — D631 Anemia in chronic kidney disease: Secondary | ICD-10-CM | POA: Diagnosis present

## 2016-04-10 DIAGNOSIS — Y832 Surgical operation with anastomosis, bypass or graft as the cause of abnormal reaction of the patient, or of later complication, without mention of misadventure at the time of the procedure: Secondary | ICD-10-CM | POA: Diagnosis present

## 2016-04-10 DIAGNOSIS — D62 Acute posthemorrhagic anemia: Secondary | ICD-10-CM | POA: Diagnosis not present

## 2016-04-10 DIAGNOSIS — Z452 Encounter for adjustment and management of vascular access device: Secondary | ICD-10-CM

## 2016-04-10 DIAGNOSIS — I120 Hypertensive chronic kidney disease with stage 5 chronic kidney disease or end stage renal disease: Secondary | ICD-10-CM | POA: Diagnosis present

## 2016-04-10 DIAGNOSIS — T82898A Other specified complication of vascular prosthetic devices, implants and grafts, initial encounter: Secondary | ICD-10-CM

## 2016-04-10 DIAGNOSIS — T827XXD Infection and inflammatory reaction due to other cardiac and vascular devices, implants and grafts, subsequent encounter: Secondary | ICD-10-CM

## 2016-04-10 DIAGNOSIS — Z95828 Presence of other vascular implants and grafts: Secondary | ICD-10-CM

## 2016-04-10 DIAGNOSIS — N186 End stage renal disease: Secondary | ICD-10-CM

## 2016-04-10 DIAGNOSIS — E8889 Other specified metabolic disorders: Secondary | ICD-10-CM | POA: Diagnosis present

## 2016-04-10 DIAGNOSIS — Z87820 Personal history of traumatic brain injury: Secondary | ICD-10-CM

## 2016-04-10 DIAGNOSIS — B9689 Other specified bacterial agents as the cause of diseases classified elsewhere: Secondary | ICD-10-CM

## 2016-04-10 DIAGNOSIS — M79609 Pain in unspecified limb: Secondary | ICD-10-CM

## 2016-04-10 HISTORY — PX: INSERTION OF DIALYSIS CATHETER: SHX1324

## 2016-04-10 HISTORY — PX: AVGG REMOVAL: SHX5153

## 2016-04-10 LAB — CBC WITH DIFFERENTIAL/PLATELET
Basophils Absolute: 0 10*3/uL (ref 0.0–0.1)
Basophils Relative: 0 %
EOS PCT: 0 %
Eosinophils Absolute: 0 10*3/uL (ref 0.0–0.7)
HCT: 26.5 % — ABNORMAL LOW (ref 36.0–46.0)
Hemoglobin: 8.3 g/dL — ABNORMAL LOW (ref 12.0–15.0)
LYMPHS ABS: 0.9 10*3/uL (ref 0.7–4.0)
LYMPHS PCT: 4 %
MCH: 30.6 pg (ref 26.0–34.0)
MCHC: 31.3 g/dL (ref 30.0–36.0)
MCV: 97.8 fL (ref 78.0–100.0)
MONO ABS: 0.9 10*3/uL (ref 0.1–1.0)
MONOS PCT: 5 %
Neutro Abs: 17.9 10*3/uL — ABNORMAL HIGH (ref 1.7–7.7)
Neutrophils Relative %: 91 %
PLATELETS: 213 10*3/uL (ref 150–400)
RBC: 2.71 MIL/uL — ABNORMAL LOW (ref 3.87–5.11)
RDW: 16.1 % — AB (ref 11.5–15.5)
WBC: 19.7 10*3/uL — ABNORMAL HIGH (ref 4.0–10.5)

## 2016-04-10 LAB — HEPATIC FUNCTION PANEL
ALBUMIN: 3.6 g/dL (ref 3.5–5.0)
ALT: 36 U/L (ref 14–54)
AST: 30 U/L (ref 15–41)
Alkaline Phosphatase: 92 U/L (ref 38–126)
Total Bilirubin: 0.5 mg/dL (ref 0.3–1.2)
Total Protein: 6.7 g/dL (ref 6.5–8.1)

## 2016-04-10 LAB — BASIC METABOLIC PANEL
Anion gap: 13 (ref 5–15)
BUN: 45 mg/dL — AB (ref 6–20)
CALCIUM: 9.4 mg/dL (ref 8.9–10.3)
CO2: 26 mmol/L (ref 22–32)
Chloride: 95 mmol/L — ABNORMAL LOW (ref 101–111)
Creatinine, Ser: 9.97 mg/dL — ABNORMAL HIGH (ref 0.44–1.00)
GFR calc Af Amer: 5 mL/min — ABNORMAL LOW (ref >60)
GFR, EST NON AFRICAN AMERICAN: 5 mL/min — AB (ref >60)
GLUCOSE: 97 mg/dL (ref 65–99)
Potassium: 4.9 mmol/L (ref 3.5–5.1)
Sodium: 134 mmol/L — ABNORMAL LOW (ref 135–145)

## 2016-04-10 LAB — POCT I-STAT 4, (NA,K, GLUC, HGB,HCT)
GLUCOSE: 82 mg/dL (ref 65–99)
HEMATOCRIT: 21 % — AB (ref 36.0–46.0)
HEMOGLOBIN: 7.1 g/dL — AB (ref 12.0–15.0)
POTASSIUM: 6 mmol/L — AB (ref 3.5–5.1)
Sodium: 133 mmol/L — ABNORMAL LOW (ref 135–145)

## 2016-04-10 LAB — RENAL FUNCTION PANEL
ALBUMIN: 2.4 g/dL — AB (ref 3.5–5.0)
ANION GAP: 10 (ref 5–15)
BUN: 51 mg/dL — ABNORMAL HIGH (ref 6–20)
CALCIUM: 7.9 mg/dL — AB (ref 8.9–10.3)
CO2: 25 mmol/L (ref 22–32)
CREATININE: 10.19 mg/dL — AB (ref 0.44–1.00)
Chloride: 97 mmol/L — ABNORMAL LOW (ref 101–111)
GFR calc non Af Amer: 4 mL/min — ABNORMAL LOW (ref >60)
GFR, EST AFRICAN AMERICAN: 5 mL/min — AB (ref >60)
GLUCOSE: 78 mg/dL (ref 65–99)
PHOSPHORUS: 5 mg/dL — AB (ref 2.5–4.6)
Potassium: 5.4 mmol/L — ABNORMAL HIGH (ref 3.5–5.1)
SODIUM: 132 mmol/L — AB (ref 135–145)

## 2016-04-10 LAB — I-STAT CG4 LACTIC ACID, ED
Lactic Acid, Venous: 0.76 mmol/L (ref 0.5–1.9)
Lactic Acid, Venous: 0.38 mmol/L — ABNORMAL LOW (ref 0.5–1.9)

## 2016-04-10 LAB — I-STAT BETA HCG BLOOD, ED (NOT ORDERABLE)

## 2016-04-10 SURGERY — INSERTION OF DIALYSIS CATHETER
Anesthesia: General | Site: Leg Upper | Laterality: Right

## 2016-04-10 MED ORDER — HEPARIN SODIUM (PORCINE) 1000 UNIT/ML IJ SOLN
INTRAMUSCULAR | Status: DC | PRN
  Administered 2016-04-10: 3.7 mL

## 2016-04-10 MED ORDER — PROTAMINE SULFATE 10 MG/ML IV SOLN
INTRAVENOUS | Status: DC | PRN
  Administered 2016-04-10: 20 mg via INTRAVENOUS

## 2016-04-10 MED ORDER — LIDOCAINE-EPINEPHRINE (PF) 1 %-1:200000 IJ SOLN
INTRAMUSCULAR | Status: AC
  Filled 2016-04-10: qty 30

## 2016-04-10 MED ORDER — AMLODIPINE BESYLATE 10 MG PO TABS
10.0000 mg | ORAL_TABLET | Freq: Every day | ORAL | Status: DC
  Administered 2016-04-11 – 2016-04-15 (×5): 10 mg via ORAL
  Filled 2016-04-10 (×5): qty 1

## 2016-04-10 MED ORDER — SODIUM CHLORIDE 0.9 % IV SOLN
1000.0000 mL | INTRAVENOUS | Status: DC
  Administered 2016-04-10: 1000 mL via INTRAVENOUS

## 2016-04-10 MED ORDER — OXYCODONE-ACETAMINOPHEN 5-325 MG PO TABS
ORAL_TABLET | ORAL | Status: AC
  Administered 2016-04-10: 2 via ORAL
  Filled 2016-04-10: qty 2

## 2016-04-10 MED ORDER — ACETAMINOPHEN 500 MG PO TABS
500.0000 mg | ORAL_TABLET | Freq: Four times a day (QID) | ORAL | Status: DC | PRN
  Administered 2016-04-15: 500 mg via ORAL
  Filled 2016-04-10: qty 1

## 2016-04-10 MED ORDER — LIDOCAINE HCL (PF) 1 % IJ SOLN: 5.0000 mL | INTRAMUSCULAR | Status: DC | PRN

## 2016-04-10 MED ORDER — SEVELAMER CARBONATE 800 MG PO TABS
2400.0000 mg | ORAL_TABLET | Freq: Two times a day (BID) | ORAL | Status: DC
  Administered 2016-04-11 – 2016-04-15 (×8): 2400 mg via ORAL
  Filled 2016-04-10 (×8): qty 3

## 2016-04-10 MED ORDER — FENTANYL CITRATE (PF) 100 MCG/2ML IJ SOLN
INTRAMUSCULAR | Status: AC
  Filled 2016-04-10: qty 4

## 2016-04-10 MED ORDER — 0.9 % SODIUM CHLORIDE (POUR BTL) OPTIME
TOPICAL | Status: DC | PRN
  Administered 2016-04-10: 2000 mL
  Administered 2016-04-10: 1000 mL

## 2016-04-10 MED ORDER — PENTAFLUOROPROP-TETRAFLUOROETH EX AERO: 1.0000 "application " | INHALATION_SPRAY | CUTANEOUS | Status: DC | PRN

## 2016-04-10 MED ORDER — ENOXAPARIN SODIUM 30 MG/0.3ML ~~LOC~~ SOLN: 30.0000 mg | SUBCUTANEOUS | Status: DC

## 2016-04-10 MED ORDER — MEPERIDINE HCL 25 MG/ML IJ SOLN: 6.2500 mg | INTRAMUSCULAR | Status: DC | PRN

## 2016-04-10 MED ORDER — LABETALOL HCL 5 MG/ML IV SOLN: 10.0000 mg | INTRAVENOUS | Status: DC | PRN

## 2016-04-10 MED ORDER — HYDROMORPHONE HCL 1 MG/ML IJ SOLN
0.5000 mg | INTRAMUSCULAR | Status: DC | PRN
  Administered 2016-04-10 – 2016-04-14 (×7): 1 mg via INTRAVENOUS
  Filled 2016-04-10 (×4): qty 1

## 2016-04-10 MED ORDER — CALCITRIOL 0.5 MCG PO CAPS
0.7500 ug | ORAL_CAPSULE | ORAL | Status: DC
  Administered 2016-04-12 – 2016-04-14 (×2): 0.75 ug via ORAL
  Filled 2016-04-10: qty 1
  Filled 2016-04-10: qty 3
  Filled 2016-04-10: qty 1

## 2016-04-10 MED ORDER — FENTANYL CITRATE (PF) 100 MCG/2ML IJ SOLN
INTRAMUSCULAR | Status: DC | PRN
  Administered 2016-04-10: 25 ug via INTRAVENOUS
  Administered 2016-04-10: 50 ug via INTRAVENOUS
  Administered 2016-04-10 (×2): 25 ug via INTRAVENOUS
  Administered 2016-04-10: 50 ug via INTRAVENOUS
  Administered 2016-04-10 (×2): 25 ug via INTRAVENOUS
  Administered 2016-04-10: 50 ug via INTRAVENOUS
  Administered 2016-04-10: 25 ug via INTRAVENOUS

## 2016-04-10 MED ORDER — FENTANYL CITRATE (PF) 100 MCG/2ML IJ SOLN
INTRAMUSCULAR | Status: AC
  Filled 2016-04-10: qty 2

## 2016-04-10 MED ORDER — PIPERACILLIN-TAZOBACTAM 3.375 G IVPB 30 MIN
3.3750 g | Freq: Once | INTRAVENOUS | Status: AC
  Administered 2016-04-10: 3.375 g via INTRAVENOUS
  Filled 2016-04-10: qty 50

## 2016-04-10 MED ORDER — PHENYLEPHRINE HCL 10 MG/ML IJ SOLN
INTRAVENOUS | Status: DC | PRN
  Administered 2016-04-10: 20 ug/min via INTRAVENOUS

## 2016-04-10 MED ORDER — VANCOMYCIN HCL 500 MG IV SOLR
500.0000 mg | INTRAVENOUS | Status: DC
  Filled 2016-04-10: qty 500

## 2016-04-10 MED ORDER — HYDROMORPHONE HCL 1 MG/ML IJ SOLN
INTRAMUSCULAR | Status: AC
  Administered 2016-04-10: 1 mg via INTRAVENOUS
  Filled 2016-04-10: qty 1

## 2016-04-10 MED ORDER — PROPOFOL 10 MG/ML IV BOLUS
INTRAVENOUS | Status: DC | PRN
  Administered 2016-04-10: 150 mg via INTRAVENOUS

## 2016-04-10 MED ORDER — LIDOCAINE-PRILOCAINE 2.5-2.5 % EX CREA: 1.0000 "application " | TOPICAL_CREAM | CUTANEOUS | Status: DC | PRN

## 2016-04-10 MED ORDER — VANCOMYCIN HCL IN DEXTROSE 1-5 GM/200ML-% IV SOLN
1000.0000 mg | Freq: Once | INTRAVENOUS | Status: AC
  Administered 2016-04-10: 1000 mg via INTRAVENOUS
  Filled 2016-04-10: qty 200

## 2016-04-10 MED ORDER — NEPRO/CARBSTEADY PO LIQD
237.0000 mL | Freq: Two times a day (BID) | ORAL | Status: DC
  Administered 2016-04-11 – 2016-04-15 (×6): 237 mL via ORAL
  Filled 2016-04-10 (×11): qty 237

## 2016-04-10 MED ORDER — VANCOMYCIN HCL IN DEXTROSE 500-5 MG/100ML-% IV SOLN
INTRAVENOUS | Status: AC
  Filled 2016-04-10: qty 100

## 2016-04-10 MED ORDER — ONDANSETRON HCL 4 MG/2ML IJ SOLN: 4.0000 mg | Freq: Four times a day (QID) | INTRAMUSCULAR | Status: DC | PRN

## 2016-04-10 MED ORDER — HYDROMORPHONE HCL 1 MG/ML IJ SOLN
INTRAMUSCULAR | Status: AC
  Administered 2016-04-10: 0.5 mg via INTRAVENOUS
  Filled 2016-04-10: qty 1

## 2016-04-10 MED ORDER — PROTAMINE SULFATE 10 MG/ML IV SOLN
INTRAVENOUS | Status: AC
  Filled 2016-04-10: qty 5

## 2016-04-10 MED ORDER — HEPARIN SODIUM (PORCINE) 1000 UNIT/ML IJ SOLN
INTRAMUSCULAR | Status: AC
  Filled 2016-04-10: qty 1

## 2016-04-10 MED ORDER — PHENOL 1.4 % MT LIQD
1.0000 | OROMUCOSAL | Status: DC | PRN
Start: 2016-04-10 — End: 2016-04-15

## 2016-04-10 MED ORDER — HEPARIN SODIUM (PORCINE) 5000 UNIT/ML IJ SOLN: 5000.0000 [IU] | Freq: Three times a day (TID) | INTRAMUSCULAR | Status: DC

## 2016-04-10 MED ORDER — ALTEPLASE 2 MG IJ SOLR: 2.0000 mg | Freq: Once | INTRAMUSCULAR | Status: DC | PRN

## 2016-04-10 MED ORDER — MORPHINE SULFATE (PF) 2 MG/ML IV SOLN
2.0000 mg | Freq: Once | INTRAVENOUS | Status: AC
Start: 2016-04-10 — End: 2016-04-10
  Administered 2016-04-10: 2 mg via INTRAVENOUS
  Filled 2016-04-10: qty 1

## 2016-04-10 MED ORDER — ONDANSETRON HCL 4 MG/2ML IJ SOLN
INTRAMUSCULAR | Status: DC | PRN
  Administered 2016-04-10: 4 mg via INTRAVENOUS

## 2016-04-10 MED ORDER — RENA-VITE PO TABS
1.0000 | ORAL_TABLET | Freq: Every day | ORAL | Status: DC
  Administered 2016-04-11 – 2016-04-14 (×5): 1 via ORAL
  Filled 2016-04-10 (×5): qty 1

## 2016-04-10 MED ORDER — METOPROLOL TARTRATE 5 MG/5ML IV SOLN: 2.0000 mg | INTRAVENOUS | Status: DC | PRN

## 2016-04-10 MED ORDER — LISINOPRIL 40 MG PO TABS
40.0000 mg | ORAL_TABLET | Freq: Every day | ORAL | Status: DC
  Administered 2016-04-11 – 2016-04-14 (×5): 40 mg via ORAL
  Filled 2016-04-10 (×5): qty 1

## 2016-04-10 MED ORDER — ONDANSETRON HCL 4 MG/2ML IJ SOLN: 4.0000 mg | Freq: Once | INTRAMUSCULAR | Status: DC | PRN

## 2016-04-10 MED ORDER — DARBEPOETIN ALFA 200 MCG/0.4ML IJ SOSY
200.0000 ug | PREFILLED_SYRINGE | INTRAMUSCULAR | Status: DC
  Administered 2016-04-14: 200 ug via INTRAVENOUS
  Filled 2016-04-10: qty 0.4

## 2016-04-10 MED ORDER — HYDRALAZINE HCL 20 MG/ML IJ SOLN: 5.0000 mg | INTRAMUSCULAR | Status: DC | PRN

## 2016-04-10 MED ORDER — PIPERACILLIN-TAZOBACTAM 3.375 G IVPB
3.3750 g | Freq: Two times a day (BID) | INTRAVENOUS | Status: DC
  Administered 2016-04-11 (×3): 3.375 g via INTRAVENOUS
  Filled 2016-04-10 (×6): qty 50

## 2016-04-10 MED ORDER — SODIUM CHLORIDE 0.9 % IV SOLN
INTRAVENOUS | Status: DC | PRN
  Administered 2016-04-10: 14:00:00

## 2016-04-10 MED ORDER — ACETAMINOPHEN 500 MG PO TABS
1000.0000 mg | ORAL_TABLET | Freq: Once | ORAL | Status: AC
  Administered 2016-04-10: 1000 mg via ORAL
  Filled 2016-04-10: qty 2

## 2016-04-10 MED ORDER — VANCOMYCIN HCL 500 MG IV SOLR
500.0000 mg | INTRAVENOUS | Status: DC
  Administered 2016-04-10: 500 mg via INTRAVENOUS
  Filled 2016-04-10: qty 500

## 2016-04-10 MED ORDER — PANTOPRAZOLE SODIUM 40 MG PO TBEC
40.0000 mg | DELAYED_RELEASE_TABLET | Freq: Every day | ORAL | Status: DC
  Administered 2016-04-11 – 2016-04-15 (×5): 40 mg via ORAL
  Filled 2016-04-10 (×5): qty 1

## 2016-04-10 MED ORDER — SODIUM CHLORIDE 0.9 % IV SOLN: 100.0000 mL | INTRAVENOUS | Status: DC | PRN

## 2016-04-10 MED ORDER — CARVEDILOL 25 MG PO TABS
25.0000 mg | ORAL_TABLET | Freq: Two times a day (BID) | ORAL | Status: DC
  Administered 2016-04-11 – 2016-04-15 (×8): 25 mg via ORAL
  Filled 2016-04-10 (×8): qty 1

## 2016-04-10 MED ORDER — HEPARIN SODIUM (PORCINE) 1000 UNIT/ML IJ SOLN
INTRAMUSCULAR | Status: DC | PRN
  Administered 2016-04-10: 5000 [IU] via INTRAVENOUS

## 2016-04-10 MED ORDER — ALUM & MAG HYDROXIDE-SIMETH 200-200-20 MG/5ML PO SUSP
15.0000 mL | ORAL | Status: DC | PRN
Start: 2016-04-10 — End: 2016-04-15

## 2016-04-10 MED ORDER — GUAIFENESIN-DM 100-10 MG/5ML PO SYRP: 15.0000 mL | ORAL_SOLUTION | ORAL | Status: DC | PRN

## 2016-04-10 MED ORDER — CALCIUM ACETATE (PHOS BINDER) 667 MG PO CAPS
1334.0000 mg | ORAL_CAPSULE | Freq: Three times a day (TID) | ORAL | Status: DC
  Administered 2016-04-11 – 2016-04-15 (×12): 1334 mg via ORAL
  Filled 2016-04-10 (×12): qty 2

## 2016-04-10 MED ORDER — MIDAZOLAM HCL 5 MG/5ML IJ SOLN
INTRAMUSCULAR | Status: DC | PRN
  Administered 2016-04-10: 1 mg via INTRAVENOUS

## 2016-04-10 MED ORDER — SODIUM CHLORIDE 0.9 % IV SOLN: 500.0000 mL | Freq: Once | INTRAVENOUS | Status: DC | PRN

## 2016-04-10 MED ORDER — OXYCODONE-ACETAMINOPHEN 5-325 MG PO TABS
1.0000 | ORAL_TABLET | ORAL | Status: DC | PRN
  Administered 2016-04-10 – 2016-04-14 (×6): 2 via ORAL
  Filled 2016-04-10 (×5): qty 2

## 2016-04-10 MED ORDER — MIDAZOLAM HCL 2 MG/2ML IJ SOLN
INTRAMUSCULAR | Status: AC
  Filled 2016-04-10: qty 2

## 2016-04-10 MED ORDER — HYDROMORPHONE HCL 1 MG/ML IJ SOLN
0.2500 mg | INTRAMUSCULAR | Status: DC | PRN
  Administered 2016-04-10 (×2): 0.5 mg via INTRAVENOUS

## 2016-04-10 MED ORDER — LIDOCAINE 2% (20 MG/ML) 5 ML SYRINGE
INTRAMUSCULAR | Status: DC | PRN
  Administered 2016-04-10: 100 mg via INTRAVENOUS

## 2016-04-10 MED ORDER — PROPOFOL 10 MG/ML IV BOLUS
INTRAVENOUS | Status: AC
  Filled 2016-04-10: qty 20

## 2016-04-10 MED ORDER — MAGNESIUM SULFATE 2 GM/50ML IV SOLN
2.0000 g | Freq: Every day | INTRAVENOUS | Status: DC | PRN
  Filled 2016-04-10: qty 50

## 2016-04-10 MED ORDER — SODIUM CHLORIDE 0.9 % IV SOLN
INTRAVENOUS | Status: DC
  Administered 2016-04-10 (×3): via INTRAVENOUS

## 2016-04-10 MED ORDER — DOCUSATE SODIUM 100 MG PO CAPS
100.0000 mg | ORAL_CAPSULE | Freq: Every day | ORAL | Status: DC
  Administered 2016-04-11 – 2016-04-15 (×4): 100 mg via ORAL
  Filled 2016-04-10 (×4): qty 1

## 2016-04-10 MED ORDER — POTASSIUM CHLORIDE CRYS ER 20 MEQ PO TBCR: 20.0000 meq | EXTENDED_RELEASE_TABLET | Freq: Every day | ORAL | Status: DC | PRN

## 2016-04-10 SURGICAL SUPPLY — 49 items
BAG DECANTER FOR FLEXI CONT (MISCELLANEOUS) ×3 IMPLANT
BIOPATCH RED 1 DISK 7.0 (GAUZE/BANDAGES/DRESSINGS) ×3 IMPLANT
CATH PALINDROME RT-P 15FX19CM (CATHETERS) IMPLANT
CATH PALINDROME RT-P 15FX23CM (CATHETERS) IMPLANT
CATH PALINDROME RT-P 15FX28CM (CATHETERS) IMPLANT
CATH PALINDROME RT-P 15FX55CM (CATHETERS) IMPLANT
CATH TRIALYSIS 20CM 13F 3LUM (IV SETS) ×1 IMPLANT
COVER PROBE W GEL 5X96 (DRAPES) ×4 IMPLANT
DRAPE C-ARM 42X72 X-RAY (DRAPES) ×3 IMPLANT
DRAPE CHEST BREAST 15X10 FENES (DRAPES) ×3 IMPLANT
DRSG TEGADERM 2-3/8X2-3/4 SM (GAUZE/BANDAGES/DRESSINGS) ×1 IMPLANT
GAUZE SPONGE 2X2 8PLY STRL LF (GAUZE/BANDAGES/DRESSINGS) IMPLANT
GAUZE SPONGE 4X4 16PLY XRAY LF (GAUZE/BANDAGES/DRESSINGS) ×4 IMPLANT
GLOVE BIO SURGEON STRL SZ7.5 (GLOVE) ×3 IMPLANT
GOWN STRL REUS W/ TWL LRG LVL3 (GOWN DISPOSABLE) ×2 IMPLANT
GOWN STRL REUS W/ TWL XL LVL3 (GOWN DISPOSABLE) ×2 IMPLANT
GOWN STRL REUS W/TWL LRG LVL3 (GOWN DISPOSABLE) ×3
GOWN STRL REUS W/TWL XL LVL3 (GOWN DISPOSABLE) ×3
KIT BASIN OR (CUSTOM PROCEDURE TRAY) ×3 IMPLANT
KIT ROOM TURNOVER OR (KITS) ×3 IMPLANT
LIQUID BAND (GAUZE/BANDAGES/DRESSINGS) ×3 IMPLANT
NDL 18GX1X1/2 (RX/OR ONLY) (NEEDLE) ×2 IMPLANT
NDL HYPO 25GX1X1/2 BEV (NEEDLE) ×2 IMPLANT
NEEDLE 18GX1X1/2 (RX/OR ONLY) (NEEDLE) ×3 IMPLANT
NEEDLE HYPO 25GX1X1/2 BEV (NEEDLE) ×3 IMPLANT
NS IRRIG 1000ML POUR BTL (IV SOLUTION) ×3 IMPLANT
PACK SURGICAL SETUP 50X90 (CUSTOM PROCEDURE TRAY) ×3 IMPLANT
PAD ARMBOARD 7.5X6 YLW CONV (MISCELLANEOUS) ×6 IMPLANT
PATCH VASC XENOSURE 1CMX6CM (Vascular Products) ×3 IMPLANT
PATCH VASC XENOSURE 1X6 (Vascular Products) IMPLANT
SOAP 2 % CHG 4 OZ (WOUND CARE) ×3 IMPLANT
SPONGE GAUZE 2X2 STER 10/PKG (GAUZE/BANDAGES/DRESSINGS) ×1
SPONGE GAUZE 4X4 12PLY STER LF (GAUZE/BANDAGES/DRESSINGS) ×1 IMPLANT
SPONGE LAP 18X18 X RAY DECT (DISPOSABLE) ×2 IMPLANT
SUT ETHILON 3 0 FSL (SUTURE) ×4 IMPLANT
SUT ETHILON 3 0 PS 1 (SUTURE) ×3 IMPLANT
SUT MNCRL AB 4-0 PS2 18 (SUTURE) ×3 IMPLANT
SUT PROLENE 5 0 C 1 24 (SUTURE) ×4 IMPLANT
SUT VIC AB 2-0 CT1 27 (SUTURE) ×15
SUT VIC AB 2-0 CT1 TAPERPNT 27 (SUTURE) IMPLANT
SUT VIC AB 3-0 SH 27 (SUTURE) ×6
SUT VIC AB 3-0 SH 27X BRD (SUTURE) IMPLANT
SWAB COLLECTION DEVICE MRSA (MISCELLANEOUS) ×1 IMPLANT
SYR 20CC LL (SYRINGE) ×6 IMPLANT
SYR 5ML LL (SYRINGE) ×3 IMPLANT
SYR CONTROL 10ML LL (SYRINGE) ×3 IMPLANT
SYRINGE 10CC LL (SYRINGE) ×3 IMPLANT
TAPE CLOTH SURG 4X10 WHT LF (GAUZE/BANDAGES/DRESSINGS) ×1 IMPLANT
WATER STERILE IRR 1000ML POUR (IV SOLUTION) ×3 IMPLANT

## 2016-04-10 NOTE — ED Notes (Signed)
Patient transported to Ultrasound 

## 2016-04-10 NOTE — Progress Notes (Signed)
Pharmacy Antibiotic Note  Katie Lyons is a 30 y.o. female admitted on 04/10/2016 with sepsis.  Pharmacy has been consulted for vancomycin and Zosyn dosing.  One time doses of vancomycin 1g IV and Zosyn 3.375g IV ordered by EDP. Patient is ESRD on HD- last HD session on Friday 8/25  Plan: -Zosyn 3.375g IV q12h EI -Vancomycin 500mg  IV qHD- MWF -Follow HD schedule/tolerance, c/s, clinical progression, levels PRN  Height: 5\' 1"  (154.9 cm) Weight: 108 lb 7.5 oz (49.2 kg) IBW/kg (Calculated) : 47.8  Temp (24hrs), Avg:103.2 F (39.6 C), Min:103.2 F (39.6 C), Max:103.2 F (39.6 C)   Recent Labs Lab 04/07/16 1715 04/10/16 0744 04/10/16 0800  WBC 5.5 19.7*  --   CREATININE 2.60*  --   --   LATICACIDVEN  --   --  0.76    Estimated Creatinine Clearance: 23.4 mL/min (by C-G formula based on SCr of 2.6 mg/dL).    No Known Allergies  Antimicrobials this admission: Vanc 8/28 >>  Zosyn 8/28 >>   Dose adjustments this admission: n/a  Microbiology results: 8/28 BCx: sent 8/28 UCx: sent    Thank you for allowing pharmacy to be a part of this patient's care.  Teodoro Jeffreys D. Kenyette Gundy, PharmD, BCPS Clinical Pharmacist Pager: [phone] 04/10/2016 8:18 AM

## 2016-04-10 NOTE — Op Note (Signed)
Patient name: Katie Lyons MRN: [id number] DOB: Jan 10, 1986 Sex: female  04/10/2016 Pre-operative Diagnosis: infected right thigh av graft Post-operative diagnosis:  Same Surgeon:  Servando Snare, MD Assistant: Gerri Lins, PA Procedure Performed:  1.  Excision of Right thigh av graft  2.  Patch angioplasty of Right common femoral artery with bovine pericardium  3.  Right IJ temporary dialysis catheter placement with US guidance   Indications:  30 year old female with a history of right thigh AV graft that was recently revised a few months prior to this procedure. She now present with erythema and fluctuance and fever and pain of her right thigh graft location. We discussed removing the thigh graft via interpreter as well as placement of temporary dialysis catheter. Risks benefits were evaluated and she agreed to proceed.  Findings: There was one area of pseudoaneurysm overlying the graft where it recent graft graft anastomosis was performed to have murky fluid within it. This was sent as culture. Most of the graft was incorporated but was removed in its entirety.  Procedure:  The patient was correctly identified and taken to the operating room where she was placed supine on the operating table and general endotracheal anesthesia was induced. She was sterilely prepped and draped in her right neck and right thigh given antibiotics by anesthesia colleagues and a timeout called. Began our operation ultrasound-guided evaluation of the right IJ vein. This was cannulated with 18-gauge needle and J-wire placed into the IVC under fluoroscopic guidance. The skin was then dilated over the wire and a temporary dialysis catheter triple-lumen catheter placed via Seldinger technique under fluoroscopic guidance. We then turned our attention to the right femoral graft. We made an incision in the groin crossing the groin crease at the site of previous incision. We dissected tediously through the scar  tissue down and identified the inguinal ligament. We were then able to identify the common femoral artery encircled this with vessel loop. We then dissected down onto the level of the graft itself were able to get around this and clamped the graft. We also has been followed down on to the level of the SFA and encircled this. We then identified the graft in its entirety with ultrasound. We did trace the graft onto the skin. Several skip incisions were made along the level of the graft. We dissected down to the graft and excised the entirety of it. We traced back onto the common femoral vein and dissected this out as well encircling the graft with vessel loop. The graft was clamped on both the arterial and venous aspects and cut. The graft was removed from the field. We also sent specimens of fluid from around the graft specifically at one area of what was likely an infected pseudoaneurysm. Following this the patient was heparinized with 5000 units of heparin. We then used Satinsky clamp at the level of the femoral vein and opened the graft onto the vein. The graft was removed in its entirety and the vein oversewn with 5-0 Prolene in a running mattress suture technique. We allowed the gland to release flush the vein and did not have any excess bleeding. We then turned our attention towards the artery. Inflow and outflow were clamped graft again excised and opened on the common femoral artery. We did have some significant bleeding from the profunda which we were unable to isolate so Satinsky Was then placed. We performed patch angioplasty with 5-0 Prolene and bovine pericardial patch. We released our clamps and way to prevent  embolic debris. Following this we checked Doppler signals of both our inflow and outflow arteries as well as our vein and we had expected Doppler signals. We then gave the patient 20 mg of protamine obtained hemostasis from all our wounds. The groin wound was closed in multiple layers with 2-0  Vicryl interrupted followed by history of Vicryl and a 4-0 Monocryl and Dermabond placed in the skin. The other wounds were closed with 2-0 Vicryl suture to approximate the tunneling followed by interrupted 3-0 nylon to the level of the skin. Patient was then let awaken from anesthesia and tolerated the procedure well plans for return to the PACU chest x-ray will be checked and she will be admitted by medical team.  Specimens: Anaerobic and aerobic cultures of the wound.  Blood loss: 300cc  [place] C. Donzetta Matters, MD [facility] Office: [phone] Pager: [phone]

## 2016-04-10 NOTE — ED Notes (Signed)
Pt transported to vascular.  °

## 2016-04-10 NOTE — Anesthesia Procedure Notes (Signed)
Procedure Name: LMA Insertion Date/Time: 04/10/2016 1:34 PM Performed by: Greggory Stallion, Jayde Mcallister L Pre-anesthesia Checklist: Patient identified, Emergency Drugs available, Suction available and Patient being monitored Patient Re-evaluated:Patient Re-evaluated prior to inductionOxygen Delivery Method: Circle System Utilized Preoxygenation: Pre-oxygenation with 100% oxygen Intubation Type: IV induction Ventilation: Mask ventilation without difficulty LMA: LMA inserted LMA Size: 4.0 Number of attempts: 1 Placement Confirmation: positive ETCO2 Tube secured with: Tape Dental Injury: Teeth and Oropharynx as per pre-operative assessment

## 2016-04-10 NOTE — Progress Notes (Signed)
Patient arrived to unit by PACU stretcher.  Reviewed treatment plan and this RN agrees with plan.  Report received from bedside RN, Remo Lipps.  Consent obtained.  Patient A & O X 4, primarily spanish speaking.   Lung sounds diminished to ausculation in all fields. Genrealized edema. Cardiac:  NSR with some PVC.  Removed caps and cleansed RIJ catheter with chlorhedxidine.  Aspirated ports of heparin and flushed them with saline per protocol.  Connected and secured lines, initiated treatment at 1850.  UF Goal of 3567mL and net fluid removal 3L.  Will continue to monitor.

## 2016-04-10 NOTE — Progress Notes (Signed)
Placed on tele to monitor  

## 2016-04-10 NOTE — Progress Notes (Addendum)
*  PRELIMINARY RESULTS* Vascular Ultrasound Duplex Dialysis Access (AVF, AGV) has been completed.  Preliminary findings: Patent AV graft of right thigh. Area of dilatation of graft noted at mid medial thigh. No evidence of perigraft fluid. Areas of mixed echoes noted at the proximal lateral thigh surrounding graft, suggestive of hematomas.   Landry Mellow, RDMS, RVT  04/10/2016, 9:59 AM

## 2016-04-10 NOTE — Consult Note (Signed)
Hospital Consult    Reason for Consult:  Infected thigh graft Referring Physician:  ED MRN #:  [id number]  History of Present Illness: This is a 30 y.o. female on m-w-f hd via R thigh graft. No records from when graft was placed. Patient only Spanish speaking and interview conducted via on-site interpreter. She last dialyzed Friday. Yesterday had fever to 103 and now pain in the R thigh. She states that graft was previously infected in June but antibiotics treated it. Has previous hd access in RUE but not left side.  Past Medical History:  Diagnosis Date  . Anxiety   . Chronic ischemic heart disease   . Depression    3-4 years ago per husband  . End stage renal disease ([place])   . Headache   . Hypertension   . Iron deficiency   . Secondary hyperparathyroidism ([place])   . Traumatic brain injury [facility])     Past Surgical History:  Procedure Laterality Date  . ARTERIOVENOUS GRAFT PLACEMENT    . NO PAST SURGERIES    . REVISION OF ARTERIOVENOUS GORETEX GRAFT Right 01/18/2016   Procedure: REVISION OF ARTERIOVENOUS GORETEX GRAFT Right leg;  Surgeon: Rosetta Posner, MD;  Location: [facility] OR;  Service: Vascular;  Laterality: Right;    No Known Allergies  Prior to Admission medications   Medication Sig Start Date End Date Taking? Authorizing Provider  acetaminophen (TYLENOL) 500 MG tablet Take 500 mg by mouth every 6 (six) hours as needed for mild pain.   Yes Historical Provider, MD  amLODipine (NORVASC) 10 MG tablet Take 10 mg by mouth daily.   Yes Historical Provider, MD  carvedilol (COREG) 25 MG tablet Take 25 mg by mouth 2 (two) times daily with a meal.    Yes Historical Provider, MD  lisinopril (PRINIVIL,ZESTRIL) 40 MG tablet Take 40 mg by mouth at bedtime.   Yes Historical Provider, MD  sevelamer carbonate (RENVELA) 800 MG tablet Take 2,400 mg by mouth 2 (two) times daily.    Yes Historical Provider, MD    Social History   Social History  . Marital status: Single    Spouse name: N/A  .  Number of children: N/A  . Years of education: N/A   Occupational History  . Not on file.   Social History Main Topics  . Smoking status: Never Smoker  . Smokeless tobacco: Never Used  . Alcohol use No  . Drug use: No  . Sexual activity: Yes    Birth control/ protection: Condom   Other Topics Concern  . Not on file   Social History Narrative  . No narrative on file    ROS: [x]  Positive   [ ]  Negative   [ ]  All sytems reviewed and are negative  Cardiovascular: []  chest pain/pressure []  palpitations []  SOB lying flat []  DOE []  pain in legs while walking []  pain in legs at rest []  pain in legs at night []  non-healing ulcers []  hx of DVT []  swelling in legs  Pulmonary: []  productive cough []  asthma/wheezing []  home O2  Neurologic: []  weakness in []  arms []  legs []  numbness in []  arms []  legs []  hx of CVA []  mini stroke [] difficulty speaking or slurred speech []  temporary loss of vision in one eye []  dizziness  Hematologic: []  hx of cancer []  bleeding problems []  problems with blood clotting easily  Endocrine:   []  diabetes []  thyroid disease  GI []  vomiting blood []  blood in stool  GU: [x]  CKD/renal  failure []  HD--[]  M/W/F or []  T/T/S Continues to make urine   Musculoskeletal: []  arthritis []  joint pain  Integumentary: [x]  rashes []  ulcers  Constitutional: [x]  fever [x]  chills   Physical Examination  Vitals:   04/10/16 1130 04/10/16 1145  BP: 137/94 145/94  Pulse: 102 104  Resp: 15 10  Temp:     Body mass index is 20.49 kg/m.  General:  WDWN in NAD Gait: Not observed HENT: WNL, normocephalic Pulmonary: normal non-labored breathing,  Cardiac: regular rate and rhythm Abdomen:  soft, NT/ND, no masses Extremities: R thigh av graft with large erythematous area in center of graft.  Musculoskeletal: no muscle wasting or atrophy  Neurologic: A&O X 3; Appropriate Affect  CBC    Component Value Date/Time   WBC 19.7 (H) 04/10/2016  0744   RBC 2.71 (L) 04/10/2016 0744   HGB 8.3 (L) 04/10/2016 0744   HCT 26.5 (L) 04/10/2016 0744   PLT 213 04/10/2016 0744   MCV 97.8 04/10/2016 0744   MCH 30.6 04/10/2016 0744   MCHC 31.3 04/10/2016 0744   RDW 16.1 (H) 04/10/2016 0744   LYMPHSABS 0.9 04/10/2016 0744   MONOABS 0.9 04/10/2016 0744   EOSABS 0.0 04/10/2016 0744   BASOSABS 0.0 04/10/2016 0744    BMET    Component Value Date/Time   NA 134 (L) 04/10/2016 0744   K 4.9 04/10/2016 0744   CL 95 (L) 04/10/2016 0744   CO2 26 04/10/2016 0744   GLUCOSE 97 04/10/2016 0744   BUN 45 (H) 04/10/2016 0744   CREATININE 9.97 (H) 04/10/2016 0744   CALCIUM 9.4 04/10/2016 0744   GFRNONAA 5 (L) 04/10/2016 0744   GFRAA 5 (L) 04/10/2016 0744    COAGS: Lab Results  Component Value Date   INR 1.05 11/09/2012   INR 1.16 07/15/2010   INR 1.08 05/29/2010      ASSESSMENT/PLAN: This is a 30 y.o. female with apparently infected R thigh av graft. Will plan to place temporary hd catheter in the OR with likely excision of the graft in the right thigh and patch of artery. Patient consented via interpreter risks and benefits of both procedures and she agrees to proceed.  [facility] C. Donzetta Matters, MD [facility] Office: [phone] Pager: [phone]
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
## 2016-04-10 NOTE — ED Notes (Signed)
MD at bedside. 

## 2016-04-10 NOTE — H&P (Signed)
Date: 04/10/2016               Patient Name:  Katie Lyons MRN: [id number]  DOB: 1986/05/05 Age / Sex: 30 y.o., female   PCP: Fleet Contras, MD         Medical Service: Internal Medicine Teaching Service         Attending Physician: Dr. Annia Belt, MD    First Contact: Dr. Holley Raring Pager: [phone]  Second Contact: Dr. Charlott Rakes Pager: [phone]       After Hours (After 5p/  First Contact Pager: [phone]  weekends / holidays): Second Contact Pager: [phone]   Chief Complaint: AV Graft Infection  History of Present Illness: Ms. Katie Lyons is a 30 y.o. 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  Patient reports that she was last dialyzed on Friday without incident. Over the weekend, she began to experience body aches and reported a fever. During this time, she began to notice pain and swelling around her dialysis access in her upper right thigh. The area was warm and tender, but she denies any drainage. She has not had any recent trauma to the area. She also endorses diffuse myalgias.  Pt has had a graft-infection in the past, last spring, but reports that this was successfully treated with antibiotics at that time. In the ED, she was found to have significant leukocytosis to 19.7, and was febrile to 103.2. She was HDS with hypertension to 170s/100s and tachycardic to the 110s, O2 sats 98-100% on RA. She was treated with Vanc/Zosyn and received doppler ultrasound of the graft site which showed patency, area of graft dilatation, no perigraft fluid, and evidence of perigraft hematomas. Further US demonstrated question of hematoma versus thrombosed pseudoaneurysm. Vascular Surgery was consulted and a plan to operatively remove the graft and place a central venous catheter was advised. Nephrology was made aware of the patient with plans for dialysis in house via the newly placed central venous  line.  She also complained of CP and SOB x3-4 days. But denies HA, sore throat, cough, lightheadedness, abd pain, N/V, diarrhea, urinary sx.  Pt reports coming to the [place] 11 years ago. She reports having been on dialysis for 10 years. She is not able to identify why she has renal failure and denies HTN and DM.  Meds: Current Facility-Administered Medications  Medication Dose Route Frequency Provider Last Rate Last Dose  . 0.9 %  sodium chloride infusion  1,000 mL Intravenous Continuous Nona Dell, [place]   Stopped at 04/10/16 [phone]  . 0.9 %  sodium chloride infusion   Intravenous Continuous Lillia Abed, MD 10 mL/hr at 04/10/16 1256    . [MAR Hold] piperacillin-tazobactam (ZOSYN) IVPB 3.375 g  3.375 g Intravenous Q12H Lauren D Bajbus, RPH      . [MAR Hold] vancomycin (VANCOCIN) 500 mg in sodium chloride 0.9 % 100 mL IVPB  500 mg Intravenous Q M,W,F-HD Lauren D Bajbus, RPH       Medications Prior to Admission  Medication Sig Dispense Refill  . acetaminophen (TYLENOL) 500 MG tablet Take 500 mg by mouth every 6 (six) hours as needed for mild pain.    Marland Kitchen amLODipine (NORVASC) 10 MG tablet Take 10 mg by mouth daily.    . carvedilol (COREG) 25 MG tablet Take 25 mg by mouth 2 (two) times daily with a meal.     . lisinopril (  PRINIVIL,ZESTRIL) 40 MG tablet Take 40 mg by mouth at bedtime.    . sevelamer carbonate (RENVELA) 800 MG tablet Take 2,400 mg by mouth 2 (two) times daily.      Allergies: Allergies as of 04/10/2016  . (No Known Allergies)   Past Medical History:  Diagnosis Date  . Anxiety   . Chronic ischemic heart disease   . Depression    3-4 years ago per husband  . End stage renal disease ([place])   . Headache   . Hypertension   . Iron deficiency   . Secondary hyperparathyroidism ([place])   . Traumatic brain injury [facility])    Family History: No family history of kidney disease is known.  Social History: Spanish-speaker. She does not use tobacco or alcohol.  Review of  Systems: A complete ROS was negative except as per HPI. Review of Systems  Constitutional: Positive for chills and fever. Negative for weight loss.  Eyes: Negative for blurred vision.  Respiratory: Negative for cough and shortness of breath.   Cardiovascular: Negative for chest pain and leg swelling.  Gastrointestinal: Negative for abdominal pain, constipation, diarrhea, nausea and vomiting.  Genitourinary: Negative for dysuria, frequency and urgency.  Musculoskeletal: Positive for myalgias.  Skin: Negative for rash.  Neurological: Negative for dizziness, tremors and headaches.  Endo/Heme/Allergies: Negative for polydipsia.  Psychiatric/Behavioral: The patient is not nervous/anxious.    Physical Exam: Vitals:   04/10/16 1145 04/10/16 1200 04/10/16 1215 04/10/16 1230  BP: 145/94 140/91 145/96 142/95  Pulse: 104 94 95 92  Resp: 10 15    Temp:      TempSrc:      SpO2: 100% 99% 98% 100%  Weight:      Height:       Physical Exam  Constitutional: She is oriented to person, place, and time. She appears well-developed. She is cooperative. No distress.  HENT:  Head: Normocephalic and atraumatic.  Right Ear: Hearing normal.  Left Ear: Hearing normal.  Nose: Nose normal.  Mouth/Throat: Oropharynx is clear and moist and mucous membranes are normal.  Cardiovascular: Normal rate, regular rhythm, S1 normal, S2 normal and intact distal pulses.  Exam reveals no gallop.   Murmur heard. Pulmonary/Chest: Effort normal and breath sounds normal. No respiratory distress. She has no wheezes. She has no rhonchi. She has no rales. She exhibits no tenderness. Breasts are symmetrical.  Abdominal: Soft. Normal appearance and bowel sounds are normal. She exhibits no ascites. There is no hepatosplenomegaly. There is no tenderness. There is no CVA tenderness.  Musculoskeletal: Normal range of motion. She exhibits tenderness (dressing over surgical wound on right thigh).  Neurological: She is alert and  oriented to person, place, and time. She has normal strength.  Skin: Skin is warm and intact. She is diaphoretic.  Psychiatric: She has a normal mood and affect. Her speech is normal and behavior is normal.   Labs: CBC:  Recent Labs Lab 04/07/16 1715 04/10/16 0744  WBC 5.5 19.7*  NEUTROABS  --  17.9*  HGB 7.5* 8.3*  HCT 23.3* 26.5*  MCV 94.0 97.8  PLT 165 [id number]   Basic Metabolic Panel:  Recent Labs Lab 04/07/16 1715 04/10/16 0744  NA 136 134*  K 3.1* 4.9  CL 91* 95*  CO2 36* 26  GLUCOSE 107* 97  BUN <5* 45*  CREATININE 2.60* 9.97*  CALCIUM 8.3* 9.4   Cardiac Enzymes:  Recent Labs Lab 04/07/16 1749  TROPIPOC 0.00   Liver Function Tests:  Recent Labs Lab 04/10/16 0744  AST 30  ALT 36  ALKPHOS 92  BILITOT 0.5  PROT 6.7  ALBUMIN 3.6   EKG: Date/Time:  Monday April 10 2016 07:52:55 EDT Ventricular Rate:  109 PR Interval:    QRS Duration: 80 QT Interval:  308 QTC Calculation: 415 R Axis:   61 Text Interpretation:  Sinus tachycardia ST elev, probable normal early repol pattern Baseline wander in lead(s) V2 No STEMI.  Similar to prior tracing wtih interval sinus tachycardia.  Confirmed by LONG MD, JOSHUA [phone]) on 04/10/2016 8:07:10 AM  Imaging: Dg Chest 2 View  Result Date: 04/10/2016 CLINICAL DATA:  Fever. EXAM: CHEST  2 VIEW COMPARISON:  Radiographs of April 07, 2016. FINDINGS: Stable cardiomegaly. No pneumothorax or pleural effusion is noted. No acute pulmonary disease is noted. Bony thorax is unremarkable. IMPRESSION: No active cardiopulmonary disease. Electronically Signed   By: Marijo Conception, M.D.   On: 04/10/2016 08:41   [place] Extrem Low Right Ltd  Result Date: 04/10/2016 CLINICAL DATA:  Swelling at site of fistula for 3 days, post revision surgery 01/18/2016, hypertension, end-stage renal disease EXAM: ULTRASOUND RIGHT LOWER EXTREMITY LIMITED TECHNIQUE: Ultrasound examination of the lower extremity soft tissues was performed in the area of clinical  concern at the proximal anterior RIGHT thigh. COMPARISON:  None FINDINGS: Patent graft identified at the site of clinical concern. In addition, a large heterogeneous predominately isoechoic collection is seen surrounding the graft, overall measuring 4.9 x 3.4 x 3.8 cm in size. This area contains several linear foci, 1 of which causes a small amount of shadowing, question calcification. This collection is well-defined and demonstrates no internal blood flow on color Doppler imaging. This could either represent a hematoma or a thrombosed pseudoaneurysm. Minimal scattered subcutaneous edema. IMPRESSION: 4.9 x 3.4 x 3.8 cm diameter well-circumscribed heterogeneous predominately isoechoic collection surrounding a patent graft at the site of clinical concern question hematoma versus thrombosed pseudoaneurysm. Electronically Signed   By: Lavonia Dana M.D.   On: 04/10/2016 11:17   Assessment & Plan by Problem: Active Problems:   Sepsis [facility])  Ms. Ashantie Sewer is a 30 y.o. female with ESRD on HD who presents with 2 day h/o fevers and painful swelling of AV graft site.  1) Sepsis from AV Graft Infection: Clinical signs of infection around upper right thigh HD graft, leukocytosis to 19.7 and febrile. Vascular Surgery to explore graft operatively with likely removal and replacement with central line for HD. Received Vanc and Zosyn in ED. HDS. Having PVCs in PACU after vasc cath placement, noted to be in ventricle on XR. Pulled back, if continues to have PVCs may need to be pulled back further. - Admit to Stepdown - Vasc Surg to OR, appreciate recs  - will explore possible site for new HD access - Continue Vanc/Zosyn - f/u BCx x2 - f/u wound cultures - follow CBC in AM  2) ESRD: Last dialysis on 8/25. K 4.9 on admission, up to 6.0 post-op. Nephrology aware, plan for dialysis in house through new central vasc cath tonight. - CBC/BMP in AM  3) HTN: Hypertensive on arrival in the setting of acute  infection. Home meds: lisinopril 40mg  qD, amlodipine 10mg  qD, carvedilol 25mg  BID. - continue home meds  DVT PPx - low molecular weight heparin  Code Status - Full  Consults Placed - Vasc Surg, Nephrology  Dispo: Admit patient to Inpatient with expected length of stay greater than 2 midnights.  Signed: Holley Raring, MD 04/10/2016, 1:10 PM  Pager: [phone]

## 2016-04-10 NOTE — ED Triage Notes (Signed)
Pt arrives ambulatory from home with c/o fever and body aches. C/o some cough over last day. Dialysis on Friday. Due today. Last tylenol 7 pm last night.

## 2016-04-10 NOTE — Anesthesia Preprocedure Evaluation (Signed)
Anesthesia Evaluation  Patient identified by MRN, date of birth, ID band Patient awake    Reviewed: Allergy & Precautions, NPO status , Patient's Chart, lab work & pertinent test results  Airway Mallampati: I  TM Distance: >3 FB Neck ROM: Full    Dental   Pulmonary    Pulmonary exam normal        Cardiovascular hypertension, Pt. on medications Normal cardiovascular exam     Neuro/Psych    GI/Hepatic   Endo/Other    Renal/GU ESRF and DialysisRenal disease     Musculoskeletal   Abdominal   Peds  Hematology   Anesthesia Other Findings   Reproductive/Obstetrics                             Anesthesia Physical Anesthesia Plan  ASA: III  Anesthesia Plan: General   Post-op Pain Management:    Induction: Intravenous  Airway Management Planned: LMA  Additional Equipment:   Intra-op Plan:   Post-operative Plan: Extubation in OR  Informed Consent: I have reviewed the patients History and Physical, chart, labs and discussed the procedure including the risks, benefits and alternatives for the proposed anesthesia with the patient or authorized representative who has indicated his/her understanding and acceptance.     Plan Discussed with: CRNA and Surgeon  Anesthesia Plan Comments:         Anesthesia Quick Evaluation

## 2016-04-10 NOTE — ED Notes (Signed)
Patient transported to X-ray 

## 2016-04-10 NOTE — Transfer of Care (Signed)
Immediate Anesthesia Transfer of Care Note  Patient: Katie Lyons  Procedure(s) Performed: Procedure(s): insertion Dialysis catheter (Right) REMOVAL OF Right Leg ARTERIOVENOUS GORETEX GRAFT (AVGG) (Right)  Patient Location: PACU  Anesthesia Type:General  Level of Consciousness: awake, alert , oriented and patient cooperative  Airway & Oxygen Therapy: Patient Spontanous Breathing and Patient connected to nasal cannula oxygen  Post-op Assessment: Report given to RN, Post -op Vital signs reviewed and stable and Patient moving all extremities X 4  Post vital signs: Reviewed and stable  Last Vitals:  Vitals:   04/10/16 1215 04/10/16 1230  BP: 145/96 142/95  Pulse: 95 92  Resp:    Temp:      Last Pain:  Vitals:   04/10/16 1120  TempSrc: Oral  PainSc: 0-No pain         Complications: No apparent anesthesia complications

## 2016-04-10 NOTE — Progress Notes (Signed)
Dialysis treatment completed.  3500 mL ultrafiltrated.  3000 mL net fluid removal.  Patient status unchanged. Lung sounds diminished to ausculation in all fields. Generalized edema. Cardiac: NSR.  Cleansed RIJ catheter with chlorhexidine.  Disconnected lines and flushed ports with saline per protocol.  Ports locked with heparin and capped per protocol.    Report given to bedside, RN Claiborne Billings.
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
## 2016-04-11 ENCOUNTER — Inpatient Hospital Stay (HOSPITAL_COMMUNITY): Payer: Medicaid Other

## 2016-04-11 ENCOUNTER — Encounter (HOSPITAL_COMMUNITY): Payer: Self-pay | Admitting: Vascular Surgery

## 2016-04-11 DIAGNOSIS — Z01810 Encounter for preprocedural cardiovascular examination: Secondary | ICD-10-CM

## 2016-04-11 DIAGNOSIS — A419 Sepsis, unspecified organism: Secondary | ICD-10-CM

## 2016-04-11 LAB — PREPARE RBC (CROSSMATCH)

## 2016-04-11 LAB — BLOOD CULTURE ID PANEL (REFLEXED)
ACINETOBACTER BAUMANNII: NOT DETECTED
CANDIDA GLABRATA: NOT DETECTED
CANDIDA KRUSEI: NOT DETECTED
CANDIDA PARAPSILOSIS: NOT DETECTED
Candida albicans: NOT DETECTED
Candida tropicalis: NOT DETECTED
ENTEROBACTER CLOACAE COMPLEX: NOT DETECTED
ESCHERICHIA COLI: NOT DETECTED
Enterobacteriaceae species: NOT DETECTED
Enterococcus species: NOT DETECTED
Haemophilus influenzae: NOT DETECTED
KLEBSIELLA OXYTOCA: NOT DETECTED
Klebsiella pneumoniae: NOT DETECTED
LISTERIA MONOCYTOGENES: NOT DETECTED
Neisseria meningitidis: NOT DETECTED
PROTEUS SPECIES: NOT DETECTED
PSEUDOMONAS AERUGINOSA: NOT DETECTED
SERRATIA MARCESCENS: NOT DETECTED
STAPHYLOCOCCUS SPECIES: NOT DETECTED
STREPTOCOCCUS AGALACTIAE: NOT DETECTED
STREPTOCOCCUS PNEUMONIAE: NOT DETECTED
STREPTOCOCCUS PYOGENES: NOT DETECTED
STREPTOCOCCUS SPECIES: DETECTED — AB
Staphylococcus aureus (BCID): NOT DETECTED

## 2016-04-11 LAB — CBC
HCT: 21.9 % — ABNORMAL LOW (ref 36.0–46.0)
HCT: 24.1 % — ABNORMAL LOW (ref 36.0–46.0)
Hemoglobin: 6.8 g/dL — CL (ref 12.0–15.0)
Hemoglobin: 7.7 g/dL — ABNORMAL LOW (ref 12.0–15.0)
MCH: 29.8 pg (ref 26.0–34.0)
MCH: 30.1 pg (ref 26.0–34.0)
MCHC: 31.1 g/dL (ref 30.0–36.0)
MCHC: 32 g/dL (ref 30.0–36.0)
MCV: 94.1 fL (ref 78.0–100.0)
MCV: 96.1 fL (ref 78.0–100.0)
PLATELETS: 180 10*3/uL (ref 150–400)
Platelets: 170 10*3/uL (ref 150–400)
RBC: 2.56 MIL/uL — ABNORMAL LOW (ref 3.87–5.11)
RBC: 2.28 MIL/uL — AB (ref 3.87–5.11)
RDW: 15.7 % — AB (ref 11.5–15.5)
RDW: 16 % — AB (ref 11.5–15.5)
WBC: 11.1 10*3/uL — AB (ref 4.0–10.5)
WBC: 12.8 10*3/uL — AB (ref 4.0–10.5)

## 2016-04-11 LAB — BASIC METABOLIC PANEL
ANION GAP: 8 (ref 5–15)
BUN: 11 mg/dL (ref 6–20)
CALCIUM: 7.4 mg/dL — AB (ref 8.9–10.3)
CO2: 28 mmol/L (ref 22–32)
Chloride: 98 mmol/L — ABNORMAL LOW (ref 101–111)
Creatinine, Ser: 3.68 mg/dL — ABNORMAL HIGH (ref 0.44–1.00)
GFR calc Af Amer: 18 mL/min — ABNORMAL LOW (ref >60)
GFR, EST NON AFRICAN AMERICAN: 15 mL/min — AB (ref >60)
Glucose, Bld: 95 mg/dL (ref 65–99)
POTASSIUM: 3.8 mmol/L (ref 3.5–5.1)
SODIUM: 134 mmol/L — AB (ref 135–145)

## 2016-04-11 LAB — MRSA PCR SCREENING: MRSA BY PCR: NEGATIVE

## 2016-04-11 MED ORDER — SODIUM CHLORIDE 0.9 % IV SOLN
Freq: Once | INTRAVENOUS | Status: AC
  Administered 2016-04-11: 04:00:00 via INTRAVENOUS

## 2016-04-11 MED ORDER — HEPARIN SODIUM (PORCINE) 5000 UNIT/ML IJ SOLN
5000.0000 [IU] | Freq: Three times a day (TID) | INTRAMUSCULAR | Status: DC
  Administered 2016-04-11 – 2016-04-15 (×11): 5000 [IU] via SUBCUTANEOUS
  Filled 2016-04-11 (×11): qty 1

## 2016-04-11 NOTE — Progress Notes (Signed)
Assessment/Plan: 1.  GPC bacteremia/AV Graft Infection - s/p AVGG removal  2.  Chest pain - per primary previous w/u in ED on 8/25 neg 3.  ESRD -  [facility] - 4.  Hypertension/volume  - BP good in hospital  Subjective: Interval History: pain in groin, has TDC  Objective: Vital signs in last 24 hours: Temp:  [98.1 F (36.7 C)-99.4 F (37.4 C)] 98.4 F (36.9 C) (08/29 1100) Pulse Rate:  [67-95] 84 (08/29 0700) Resp:  [9-27] 12 (08/29 0700) BP: (114-145)/(71-96) 128/86 (08/29 0700) SpO2:  [97 %-100 %] 100 % (08/29 0700) Weight:  [47.9 kg (105 lb 9.6 oz)] 47.9 kg (105 lb 9.6 oz) (08/28 2320) Weight change:   Intake/Output from previous day: 08/28 0701 - 08/29 0700 In: 2955 [P.O.:360; I.V.:2210; Blood:335; IV Piggyback:50] Out: 3400 [Blood:400] Intake/Output this shift: Total I/O In: 20 [I.V.:20] Out: -   General appearance: alert and cooperative Resp: clear to auscultation bilaterally Cardio: regular rate and rhythm, S1, S2 normal, no murmur, click, rub or gallop Extremities: right groin bandaged  Right chest [facility]   Lab Results:  Recent Labs  04/11/16 0041 04/11/16 0725  WBC 12.8* 11.1*  HGB 6.8* 7.7*  HCT 21.9* 24.1*  PLT 180 170   BMET:  Recent Labs  04/10/16 1901 04/11/16 0041  NA 132* 134*  K 5.4* 3.8  CL 97* 98*  CO2 25 28  GLUCOSE 78 95  BUN 51* 11  CREATININE 10.19* 3.68*  CALCIUM 7.9* 7.4*   No results for input(s): PTH in the last 72 hours. Iron Studies: No results for input(s): IRON, TIBC, TRANSFERRIN, FERRITIN in the last 72 hours. Studies/Results: Dg Chest 2 View  Result Date: 04/10/2016 CLINICAL DATA:  Fever. EXAM: CHEST  2 VIEW COMPARISON:  Radiographs of April 07, 2016. FINDINGS: Stable cardiomegaly. No pneumothorax or pleural effusion is noted. No acute pulmonary disease is noted. Bony thorax is unremarkable. IMPRESSION: No active cardiopulmonary disease. Electronically Signed   By: Marijo Conception, M.D.   On: 04/10/2016 08:41   [place]  Extrem Low Right Ltd  Result Date: 04/10/2016 CLINICAL DATA:  Swelling at site of fistula for 3 days, post revision surgery 01/18/2016, hypertension, end-stage renal disease EXAM: ULTRASOUND RIGHT LOWER EXTREMITY LIMITED TECHNIQUE: Ultrasound examination of the lower extremity soft tissues was performed in the area of clinical concern at the proximal anterior RIGHT thigh. COMPARISON:  None FINDINGS: Patent graft identified at the site of clinical concern. In addition, a large heterogeneous predominately isoechoic collection is seen surrounding the graft, overall measuring 4.9 x 3.4 x 3.8 cm in size. This area contains several linear foci, 1 of which causes a small amount of shadowing, question calcification. This collection is well-defined and demonstrates no internal blood flow on color Doppler imaging. This could either represent a hematoma or a thrombosed pseudoaneurysm. Minimal scattered subcutaneous edema. IMPRESSION: 4.9 x 3.4 x 3.8 cm diameter well-circumscribed heterogeneous predominately isoechoic collection surrounding a patent graft at the site of clinical concern question hematoma versus thrombosed pseudoaneurysm. Electronically Signed   By: Lavonia Dana M.D.   On: 04/10/2016 11:17   Dg Chest 1v Repeat Same Day  Result Date: 04/10/2016 CLINICAL DATA:  Adjustment of central catheter EXAM: CHEST:  ONE VIEW; INTRAOPERATIVE FLUOROSCOPY COMPARISON:  Study obtained earlier in the day FINDINGS: Central catheter tip is now at the cavoatrial junction. No pneumothorax. There is mild right base atelectasis. Lungs elsewhere clear. Heart is mildly enlarged but stable. The pulmonary vascular is normal. No adenopathy. No  bone lesions. IMPRESSION: Central catheter tip at cavoatrial junction. No pneumothorax. Mild right base atelectasis. Lungs elsewhere clear. Stable cardiac prominence. Electronically Signed   By: Lowella Grip III M.D.   On: 04/10/2016 19:11   Dg Chest Port 1 View  Result Date:  04/10/2016 CLINICAL DATA:  Central line placement EXAM: PORTABLE CHEST 1 VIEW COMPARISON:  04/10/2016 FINDINGS: Cardiac enlargement. Pulmonary vascularity is normal. No focal airspace disease or consolidation in the lungs. Interval placement of a right central venous catheter. Tip localizes over the low right atrium. No pneumothorax. No blunting of costophrenic angles. Mediastinal contours appear intact. IMPRESSION: Central venous catheter tip overlies the low right atrium. If placement at the cavoatrial junction is desired, would need to retract the catheter about 4.6 cm. Cardiac enlargement. No evidence of active pulmonary disease. Electronically Signed   By: Lucienne Capers M.D.   On: 04/10/2016 19:02   Dg Fluoro Guide Cv Line-no Report  Result Date: 04/10/2016 CLINICAL DATA:  FLOURO GUIDE CV LINE Fluoroscopy was utilized by the requesting physician.  No radiographic interpretation.    Scheduled: . amLODipine  10 mg Oral Daily  . calcitRIOL  0.75 mcg Oral Q M,W,F-HD  . calcium acetate  1,334 mg Oral TID WC  . carvedilol  25 mg Oral BID WC  . [START ON 04/14/2016] darbepoetin (ARANESP) injection - DIALYSIS  200 mcg Intravenous Q Fri-HD  . docusate sodium  100 mg Oral Daily  . feeding supplement (NEPRO CARB STEADY)  237 mL Oral BID BM  . heparin subcutaneous  5,000 Units Subcutaneous Q8H  . lisinopril  40 mg Oral QHS  . multivitamin  1 tablet Oral QHS  . pantoprazole  40 mg Oral Daily  . piperacillin-tazobactam (ZOSYN)  IV  3.375 g Intravenous Q12H  . sevelamer carbonate  2,400 mg Oral BID WC    LOS: 1 day   Najir Roop C 04/11/2016,11:46 AM

## 2016-04-11 NOTE — Anesthesia Postprocedure Evaluation (Signed)
Anesthesia Post Note  Patient: Katie Lyons  Procedure(s) Performed: Procedure(s) (LRB): insertion Dialysis catheter (Right) REMOVAL OF Right Leg ARTERIOVENOUS GORETEX GRAFT ([place]) (Right)  Patient location during evaluation: PACU Anesthesia Type: General Level of consciousness: awake and alert Pain management: pain level controlled Vital Signs Assessment: post-procedure vital signs reviewed and stable Respiratory status: spontaneous breathing, nonlabored ventilation, respiratory function stable and patient connected to nasal cannula oxygen Cardiovascular status: blood pressure returned to baseline and stable Postop Assessment: no signs of nausea or vomiting Anesthetic complications: no    Last Vitals:  Vitals:   04/11/16 0619 04/11/16 0700  BP: 125/80 128/86  Pulse: 82 84  Resp: 15 12  Temp: 37.1 C 37.3 C    Last Pain:  Vitals:   04/11/16 0837  TempSrc:   PainSc: 5                  Karlisha Mathena DAVID

## 2016-04-11 NOTE — Progress Notes (Addendum)
Subjective: Currently, the patient reports pain in neck and thigh after surgery. Aware of plan for temporary HD catherter until infection is cleared and permanent access can be placed and matures.  Interval Events: Graft removed w/ vascular surg and temp cath placed. Transfusion 1U pRBC ON.  Objective: Vital signs in last 24 hours: Vitals:   04/11/16 0116 04/11/16 0408 04/11/16 0432 04/11/16 0619  BP: 124/81 114/77 124/71 125/80  Pulse: 87 87 88 82  Resp: 17 17 13 15   Temp:  99.4 F (37.4 C) 99.1 F (37.3 C) 98.7 F (37.1 C)  TempSrc:  Oral Oral Oral  SpO2: 98% 97% 97% 100%  Weight:      Height:       24-hour weight change: Filed Weights   04/10/16 0725 04/10/16 2320  Weight: 108 lb 7.5 oz (49.2 kg) 105 lb 9.6 oz (47.9 kg)   Intake/Output:  08/28 0701 - 08/29 0700 In: 2935 [P.O.:360; I.V.:2190; Blood:335; IV Piggyback:50] Out: 3400 [Blood:400]    Physical Exam: Physical Exam  Constitutional: She appears well-developed. She is cooperative. No distress.  HENT:  Mouth/Throat: Oropharynx is clear and moist.  Cardiovascular: Normal rate, regular rhythm, intact distal pulses and normal pulses.   Pulmonary/Chest: Effort normal. No respiratory distress. Breasts are symmetrical.  Abdominal: Soft. There is no tenderness. There is no rebound and no guarding.  Musculoskeletal: She exhibits tenderness (Right thigh surgical site, clean, dry, bandaged). She exhibits no edema.  Skin: Skin is warm and dry.   Labs: CBC:  Recent Labs Lab 04/07/16 1715 04/10/16 0744 04/10/16 1749 04/11/16 0041 04/11/16 0725  WBC 5.5 19.7*  --  12.8* 11.1*  NEUTROABS  --  17.9*  --   --   --   HGB 7.5* 8.3* 7.1* 6.8* 7.7*  HCT 23.3* 26.5* 21.0* 21.9* 24.1*  MCV 94.0 97.8  --  96.1 94.1  PLT 165 213  --  99991111 [id number]   Metabolic Panel:  Recent Labs Lab 04/07/16 1715 04/10/16 0744 04/10/16 1749 04/10/16 1901 04/11/16 0041  NA 136 134* 133* 132* 134*  K 3.1* 4.9 6.0* 5.4* 3.8  CL 91* 95*   --  97* 98*  CO2 36* 26  --  25 28  GLUCOSE 107* 97 82 78 95  BUN <5* 45*  --  51* 11  CREATININE 2.60* 9.97*  --  10.19* 3.68*  CALCIUM 8.3* 9.4  --  7.9* 7.4*  PHOS  --   --   --  5.0*  --   ALT  --  36  --   --   --   ALKPHOS  --  92  --   --   --   BILITOT  --  0.5  --   --   --   PROT  --  6.7  --   --   --   ALBUMIN  --  3.6  --  2.4*  --    Cardiac Labs:  Recent Labs Lab 04/07/16 1749  TROPIPOC 0.00   Microbiology: BCx - Streptococcus (non-MRSA) 2/2, speciation pending Wound Cx - rare GPC pairs, culture pending   Medications: Infusions: . sodium chloride Stopped (04/10/16 0937)  . sodium chloride 10 mL/hr at 04/10/16 1256   Scheduled Medications: . amLODipine  10 mg Oral Daily  . calcitRIOL  0.75 mcg Oral Q M,W,F-HD  . calcium acetate  1,334 mg Oral TID WC  . carvedilol  25 mg Oral BID WC  . [START ON 04/14/2016] darbepoetin (ARANESP) injection -  DIALYSIS  200 mcg Intravenous Q Fri-HD  . docusate sodium  100 mg Oral Daily  . enoxaparin (LOVENOX) injection  30 mg Subcutaneous Q24H  . feeding supplement (NEPRO CARB STEADY)  237 mL Oral BID BM  . lisinopril  40 mg Oral QHS  . multivitamin  1 tablet Oral QHS  . pantoprazole  40 mg Oral Daily  . piperacillin-tazobactam (ZOSYN)  IV  3.375 g Intravenous Q12H  . sevelamer carbonate  2,400 mg Oral BID WC  . vancomycin  500 mg Intravenous Q M,W,F-HD   PRN Medications: sodium chloride, acetaminophen, alum & mag hydroxide-simeth, guaiFENesin-dextromethorphan, hydrALAZINE, HYDROmorphone (DILAUDID) injection, labetalol, magnesium sulfate 1 - 4 g bolus IVPB, metoprolol, ondansetron, oxyCODONE-acetaminophen, phenol, potassium chloride  Assessment/Plan: Pt is a 30 y.o. yo female with a PMHx of ESRD on HD who was admitted on 04/10/2016 with symptoms of fever and pain of right thigh, which was determined to be secondary to HD graft infection.  Sepsis from AV Graft Infection: Graft removed by Vasc Surg, temp vasc in place.  Afebrile, WBC resolving now 11. BCx w/ strep, no MRSA. Will d/c Vanc and await speciation to narrow further. Wound Cx with rare GPC pairs, will follow. Vascular surg to explor permanent HD access options - Vasc Surg to OR, appreciate recs                       - LUE vein mapping today - d/c Vanc, continue Zosyn - f/u BCx strep speciation - f/u wound cultures - will repeat BCx after 48hr of Abx to assess for clearance - follow CBC in AM  Anemia: 2/2 acute BL from surgery and CKD, Hgb 6.8. Transfusion x 1 ON w/ Hgb response to 7.7 - follow H&H, high threshold for future transfusions in absence of active bleeding  ESRD: Last dialysis on 8/28. K now 3.8. Will plan for new HD access w/ vascular surg. Nephrology following.  HTN: BP controlled. Continue home meds: lisinopril 40mg  qD, amlodipine 10mg  qD, carvedilol 25mg  BID.  Length of Stay: 1 day(s) Dispo: Anticipated discharge in approximately 2-3 day(s).  Holley Raring, MD Pager: [phone] (7AM-5PM) 04/11/2016, 6:36 AM

## 2016-04-11 NOTE — Anesthesia Postprocedure Evaluation (Signed)
Anesthesia Post Note  Patient: Katie Lyons  Procedure(s) Performed: Procedure(s) (LRB): insertion Dialysis catheter (Right) REMOVAL OF Right Leg ARTERIOVENOUS GORETEX GRAFT (AVGG) (Right)  Anesthesia Post Evaluation  Last Vitals:  Vitals:   04/11/16 0619 04/11/16 0700  BP: 125/80 128/86  Pulse: 82 84  Resp: 15 12  Temp: 37.1 C 37.3 C    Last Pain:  Vitals:   04/11/16 0837  TempSrc:   PainSc: 5                  Sidni Fusco DAVID

## 2016-04-11 NOTE — Progress Notes (Signed)
CRITICAL VALUE ALERT  Critical value received:  Hemoglobin 6.8   Date of notification:  04/11/2016  Time of notification:  0103  Critical value read back:Yes.    Nurse who received alert:  Corie Chiquito  MD notified (1st page):  Wynetta Emery (on call intern)  Time of first page:  0110  MD notified (2nd page):   Time of second page:   Responding MD:     Time MD responded:

## 2016-04-11 NOTE — Evaluation (Signed)
Physical Therapy Evaluation Patient Details Name: Katie Lyons MRN: [id number] DOB: 01-04-1986 Today's Date: 04/11/2016   History of Present Illness  30 yo admitted with sepsis due to right thigh HD graft s/p removal with temporary IJ access. PMHx: ESRD, HTN  Clinical Impression  Pt with painful right thigh after graft removal limiting strength, function and gait. Pt with assist to don sock and underwear due to limited ROM as well as need for RW with gait. Pt with below deficits (PT problem list) who will benefit from acute therapy to maximize mobility, gait, strength and function to decrease burden of care and return pt to PLOF. Pt encouraged to ambulate daily with nursing staff as well as perform RLE HEP to increase strength and ROM. Interpreter phone utilized throughout session with spouse present.     Follow Up Recommendations No PT follow up    Equipment Recommendations  Rolling walker with 5" wheels    Recommendations for Other Services       Precautions / Restrictions Precautions Precautions: None      Mobility  Bed Mobility Overal bed mobility: Modified Independent             General bed mobility comments: HOB elevated, rail and increased time  Transfers Overall transfer level: Modified independent               General transfer comment: use of rail to rise and lower to toilet, use of hands from chair 3 trials  Ambulation/Gait Ambulation/Gait assistance: Supervision Ambulation Distance (Feet): 150 Feet Assistive device: Rolling walker (2 wheeled) Gait Pattern/deviations: Step-to pattern;Decreased stance time - right;Decreased dorsiflexion - right   Gait velocity interpretation: Below normal speed for age/gender General Gait Details: cues for sequence and RW use. pt progressing to RLE on floor but limited weight bearing in stance  Stairs            Wheelchair Mobility    Modified Rankin (Stroke Patients Only)       Balance                                              Pertinent Vitals/Pain Pain Assessment: 0-10 Pain Score: 7  Pain Location: right thigh Pain Descriptors / Indicators: Sore;Aching Pain Intervention(s): Limited activity within patient's tolerance;Monitored during session;Repositioned    Home Living Family/patient expects to be discharged to:: Private residence Living Arrangements: Spouse/significant other Available Help at Discharge: Family;Available PRN/intermittently Type of Home: Mobile home Home Access: Stairs to enter Entrance Stairs-Rails: Psychiatric nurse of Steps: 7 Home Layout: One level Home Equipment: None Additional Comments: has 3 children    Prior Function Level of Independence: Independent               Hand Dominance        Extremity/Trunk Assessment   Upper Extremity Assessment: Overall WFL for tasks assessed           Lower Extremity Assessment: RLE deficits/detail RLE Deficits / Details: decreased ROM and strength hip and knee due to pain right thigh    Cervical / Trunk Assessment: Normal  Communication   Communication: Prefers language other than English (Spanish)  Cognition Arousal/Alertness: Awake/alert Behavior During Therapy: WFL for tasks assessed/performed Overall Cognitive Status: Within Functional Limits for tasks assessed  General Comments      Exercises        Assessment/Plan    PT Assessment Patient needs continued PT services  PT Diagnosis Difficulty walking;Acute pain   PT Problem List Decreased strength;Decreased range of motion;Decreased mobility;Decreased activity tolerance;Decreased knowledge of use of DME;Pain  PT Treatment Interventions Gait training;Stair training;Functional mobility training;Therapeutic exercise;DME instruction;Therapeutic activities;Patient/family education   PT Goals (Current goals can be found in the Care Plan section) Acute Rehab PT  Goals Patient Stated Goal: return home PT Goal Formulation: With patient/family Time For Goal Achievement: 04/18/16 Potential to Achieve Goals: Good    Frequency Min 3X/week   Barriers to discharge        Co-evaluation               End of Session   Activity Tolerance: Patient tolerated treatment well Patient left: in chair;with call bell/phone within reach;with family/visitor present Nurse Communication: Mobility status         Time: [id number] PT Time Calculation (min) (ACUTE ONLY): 30 min   Charges:   PT Evaluation $PT Eval Moderate Complexity: 1 Procedure     PT G CodesMelford Aase 04/11/2016, 12:17 PM Elwyn Reach, [place]

## 2016-04-11 NOTE — Progress Notes (Addendum)
[facility] [place]  Subjective  - Painful right thigh.   Objective 125/80 82 98.7 F (37.1 C) (Oral) 15 100%  Intake/Output Summary (Last 24 hours) at 04/11/16 0738 Last data filed at 04/11/16 [id number]  Gross per 24 hour  Intake             2935 ml  Output             3400 ml  Net             -465 ml    Palpable right DP 3+ Dressing SS drainage will change later today Heart RRR Lungs non labored breathing  Assessment/Planning: POD # 1 HD catheter placement and excision of infected right thigh graft   Left UE vein mapping ordered Continue IV antibiotics    Laurence Slate [facility] 04/11/2016 7:38 AM --  Laboratory Lab Results:  Recent Labs  04/10/16 0744 04/10/16 1749 04/11/16 0041  WBC 19.7*  --  12.8*  HGB 8.3* 7.1* 6.8*  HCT 26.5* 21.0* 21.9*  PLT 213  --  180   BMET  Recent Labs  04/10/16 1901 04/11/16 0041  NA 132* 134*  K 5.4* 3.8  CL 97* 98*  CO2 25 28  GLUCOSE 78 95  BUN 51* 11  CREATININE 10.19* 3.68*  CALCIUM 7.9* 7.4*    COAG Lab Results  Component Value Date   INR 1.05 11/09/2012   INR 1.16 07/15/2010   INR 1.08 05/29/2010   No results found for: PTT   I have independently interviewed patient and agree with PA assessment and plan above. Abx, local wound care and will begin workup for permanent access.  Novalynn Branaman C. Donzetta Matters, MD [facility] [place] Office: [phone] Pager: [phone]

## 2016-04-11 NOTE — Progress Notes (Signed)
Left Upper Extremity Vein Map    Cephalic  Segment Diameter Depth Comment  1. Axilla 3.18mm 4.66mm   2. Mid upper arm 4.69mm 3.41mm   3. Above [facility] 3.63mm 4.24mm   4. In AC 3.100mm 40mm   5. Below AC mm mm bandages  6. Mid forearm 3.18mm 3.31mm   7. Wrist 31mm 2.34mm    mm mm    mm mm    mm mm    Basilic  Segment Diameter Depth Comment  1. Axilla mm mm   2. Mid upper arm 60mm 64mm   3. Above AC 5.10mm 49mm   4. In AC 3.79mm 78mm   5. Below AC 26mm 6.68mm   6. Mid forearm 3.109mm 39mm   7. Wrist 2.68mm 2.7mm    mm mm    mm mm    mm mm

## 2016-04-11 NOTE — Progress Notes (Signed)
PHARMACY - PHYSICIAN COMMUNICATION CRITICAL VALUE ALERT - BLOOD CULTURE IDENTIFICATION (BCID)  Results for orders placed or performed during the hospital encounter of 04/10/16  Blood Culture ID Panel (Reflexed) (Collected: 04/10/2016  7:44 AM)  Result Value Ref Range   Enterococcus species NOT DETECTED NOT DETECTED   Listeria monocytogenes NOT DETECTED NOT DETECTED   Staphylococcus species NOT DETECTED NOT DETECTED   Staphylococcus aureus NOT DETECTED NOT DETECTED   Streptococcus species DETECTED (A) NOT DETECTED   Streptococcus agalactiae NOT DETECTED NOT DETECTED   Streptococcus pneumoniae NOT DETECTED NOT DETECTED   Streptococcus pyogenes NOT DETECTED NOT DETECTED   Acinetobacter baumannii NOT DETECTED NOT DETECTED   Enterobacteriaceae species NOT DETECTED NOT DETECTED   Enterobacter cloacae complex NOT DETECTED NOT DETECTED   Escherichia coli NOT DETECTED NOT DETECTED   Klebsiella oxytoca NOT DETECTED NOT DETECTED   Klebsiella pneumoniae NOT DETECTED NOT DETECTED   Proteus species NOT DETECTED NOT DETECTED   Serratia marcescens NOT DETECTED NOT DETECTED   Haemophilus influenzae NOT DETECTED NOT DETECTED   Neisseria meningitidis NOT DETECTED NOT DETECTED   Pseudomonas aeruginosa NOT DETECTED NOT DETECTED   Candida albicans NOT DETECTED NOT DETECTED   Candida glabrata NOT DETECTED NOT DETECTED   Candida krusei NOT DETECTED NOT DETECTED   Candida parapsilosis NOT DETECTED NOT DETECTED   Candida tropicalis NOT DETECTED NOT DETECTED    Name of physician (or Provider) Contacted: Dr Gay Filler  Changes to prescribed antibiotics required: Recommended to d/c vanc  Levester Fresh, PharmD, BCPS, [facility] Clinical Pharmacist Pager [phone] 04/11/2016 9:55 AM

## 2016-04-11 NOTE — Care Management Note (Signed)
Case Management Note  Patient Details  Name: Katie Lyons MRN: [id number] Date of Birth: 07-Sep-1985  Subjective/Objective:     Patient is from home with spouse and daughter, she speaks spanish, NCM with spanish interpreter, Dortha Kern, explained to patient that she has [facility] up apt [facility] at 9:30 and she will need ast with meds she will go to [facility] if she is dc during the week, if she is dc over the weekend she will need ast with Match letter from [facility].  She has transport at dc.  NCM will cont to follow for dc needs.                Action/Plan:   Expected Discharge Date:                  Expected Discharge Plan:  Home/Self Care  In-House Referral:     Discharge planning Services  CM Consult, Follow-up appt scheduled, Medication Assistance, [facility]  Post Acute Care Choice:    Choice offered to:     DME Arranged:    DME Agency:     HH Arranged:    HH Agency:     Status of Service:  In process, will continue to follow  If discussed at Long Length of Stay Meetings, dates discussed:    Additional Comments:  Zenon Mayo, RN 04/11/2016, 5:47 PM

## 2016-04-12 DIAGNOSIS — N186 End stage renal disease: Secondary | ICD-10-CM

## 2016-04-12 DIAGNOSIS — Z992 Dependence on renal dialysis: Secondary | ICD-10-CM

## 2016-04-12 DIAGNOSIS — T827XXA Infection and inflammatory reaction due to other cardiac and vascular devices, implants and grafts, initial encounter: Secondary | ICD-10-CM

## 2016-04-12 DIAGNOSIS — D631 Anemia in chronic kidney disease: Secondary | ICD-10-CM

## 2016-04-12 LAB — RENAL FUNCTION PANEL
ANION GAP: 8 (ref 5–15)
Albumin: 2.2 g/dL — ABNORMAL LOW (ref 3.5–5.0)
BUN: 33 mg/dL — ABNORMAL HIGH (ref 6–20)
CALCIUM: 8.8 mg/dL — AB (ref 8.9–10.3)
CO2: 27 mmol/L (ref 22–32)
Chloride: 95 mmol/L — ABNORMAL LOW (ref 101–111)
Creatinine, Ser: 7.14 mg/dL — ABNORMAL HIGH (ref 0.44–1.00)
GFR calc non Af Amer: 7 mL/min — ABNORMAL LOW (ref >60)
GFR, EST AFRICAN AMERICAN: 8 mL/min — AB (ref >60)
GLUCOSE: 93 mg/dL (ref 65–99)
POTASSIUM: 4.6 mmol/L (ref 3.5–5.1)
Phosphorus: 4.5 mg/dL (ref 2.5–4.6)
Sodium: 130 mmol/L — ABNORMAL LOW (ref 135–145)

## 2016-04-12 LAB — PREPARE RBC (CROSSMATCH)

## 2016-04-12 LAB — CBC
HEMATOCRIT: 20.2 % — AB (ref 36.0–46.0)
HEMOGLOBIN: 6.6 g/dL — AB (ref 12.0–15.0)
MCH: 30 pg (ref 26.0–34.0)
MCHC: 32.7 g/dL (ref 30.0–36.0)
MCV: 91.8 fL (ref 78.0–100.0)
Platelets: 158 10*3/uL (ref 150–400)
RBC: 2.2 MIL/uL — ABNORMAL LOW (ref 3.87–5.11)
RDW: 15.7 % — AB (ref 11.5–15.5)
WBC: 8.8 10*3/uL (ref 4.0–10.5)

## 2016-04-12 MED ORDER — CEFAZOLIN SODIUM-DEXTROSE 2-4 GM/100ML-% IV SOLN
2.0000 g | INTRAVENOUS | Status: DC
  Administered 2016-04-14: 2 g via INTRAVENOUS
  Filled 2016-04-12 (×2): qty 100

## 2016-04-12 MED ORDER — SODIUM CHLORIDE 0.9 % IV SOLN
Freq: Once | INTRAVENOUS | Status: AC
  Administered 2016-04-12: 06:00:00 via INTRAVENOUS

## 2016-04-12 MED ORDER — SODIUM CHLORIDE 0.9 % IV SOLN: 100.0000 mL | INTRAVENOUS | Status: DC | PRN

## 2016-04-12 MED ORDER — CALCITRIOL 0.25 MCG PO CAPS
ORAL_CAPSULE | ORAL | Status: AC
  Filled 2016-04-12: qty 3

## 2016-04-12 MED ORDER — HEPARIN SODIUM (PORCINE) 1000 UNIT/ML DIALYSIS: 1000.0000 [IU] | INTRAMUSCULAR | Status: DC | PRN

## 2016-04-12 MED ORDER — SODIUM CHLORIDE 0.9 % IV SOLN
500.0000 mg | INTRAVENOUS | Status: DC
  Administered 2016-04-12: 500 mg via INTRAVENOUS
  Filled 2016-04-12: qty 500

## 2016-04-12 MED ORDER — CEFUROXIME SODIUM 1.5 G IJ SOLR
1.5000 g | INTRAMUSCULAR | Status: AC
  Administered 2016-04-13: 1.5 g via INTRAVENOUS
  Filled 2016-04-12 (×2): qty 1.5

## 2016-04-12 MED ORDER — HYDROMORPHONE HCL 1 MG/ML IJ SOLN
INTRAMUSCULAR | Status: AC
  Administered 2016-04-12: 1 mg via INTRAVENOUS
  Filled 2016-04-12: qty 1

## 2016-04-12 MED ORDER — LIDOCAINE HCL (PF) 1 % IJ SOLN: 5.0000 mL | INTRAMUSCULAR | Status: DC | PRN

## 2016-04-12 MED ORDER — ALTEPLASE 2 MG IJ SOLR: 2.0000 mg | Freq: Once | INTRAMUSCULAR | Status: DC | PRN

## 2016-04-12 MED ORDER — LIDOCAINE-PRILOCAINE 2.5-2.5 % EX CREA: 1.0000 "application " | TOPICAL_CREAM | CUTANEOUS | Status: DC | PRN

## 2016-04-12 MED ORDER — PENTAFLUOROPROP-TETRAFLUOROETH EX AERO: 1.0000 "application " | INHALATION_SPRAY | CUTANEOUS | Status: DC | PRN

## 2016-04-12 MED ORDER — HEPARIN SODIUM (PORCINE) 1000 UNIT/ML DIALYSIS: 20.0000 [IU]/kg | INTRAMUSCULAR | Status: DC | PRN

## 2016-04-12 NOTE — Progress Notes (Signed)
CRITICAL VALUE ALERT  Critical value received: Hgb 6.6  Date of notification: 04/12/2016  Time of notification:  0512  Critical value read back:Yes  Nurse who received alert: Josefine Class RN  MD notified (1st page): Dr. Wynetta Emery  Time of first page:  0534  MD notified (2nd page):  Time of second page:  Responding MD:  Dr. Wynetta Emery  Time MD responded: [phone]

## 2016-04-12 NOTE — Progress Notes (Addendum)
1 unit of PRBC started at  0620 with 2 Rns verified. 15 minutes VS done WNL. No reaction noted. Resp even and unlabored. Skin warm and dry. Unremarkable. Report called to dialysis. Dialysis nurse made aware of blood transfusing. Will continue to monitor.

## 2016-04-12 NOTE — Progress Notes (Signed)
RN transported the patient to HD with transportor help. VSS. pRBC unit running. HD RN aware of this.

## 2016-04-12 NOTE — Progress Notes (Signed)
Subjective: Currently, the patient is feeling well. Improvement in pain of right thigh and neck. Still w/ some pain, but controlled on current meds.  Interval Events: Hgb dropped 1 gram ON, no signs of active bleeding from leg wound. Transfused. Vein mapping complete of LUE.  Objective: Vital signs in last 24 hours: Vitals:   04/11/16 2230 04/12/16 0309 04/12/16 0604 04/12/16 0635  BP: 121/79 121/86    Pulse: 80 73    Resp: 13 12    Temp: 99.3 F (37.4 C) 98.6 F (37 C) 98.5 F (36.9 C) 98.7 F (37.1 C)  TempSrc: Oral Oral Oral Oral  SpO2: 97% 96%    Weight:      Height:       Physical Exam: Physical Exam  Constitutional: She appears well-developed. She is cooperative. No distress.  HENT:  Mouth/Throat: Oropharynx is clear and moist.  Cardiovascular: Normal rate, regular rhythm, intact distal pulses and normal pulses.   Pulmonary/Chest: Effort normal. No respiratory distress. Breasts are symmetrical.  Abdominal: Soft. There is no tenderness. There is no rebound and no guarding.  Musculoskeletal: She exhibits tenderness (Right thigh surgical site, clean, dry, bandaged). She exhibits no edema.  Skin: Skin is warm and dry.   Labs: CBC:  Recent Labs Lab 04/07/16 1715 04/10/16 0744 04/10/16 1749 04/11/16 0041 04/11/16 0725 04/12/16 0359  WBC 5.5 19.7*  --  12.8* 11.1* 8.8  NEUTROABS  --  17.9*  --   --   --   --   HGB 7.5* 8.3* 7.1* 6.8* 7.7* 6.6*  HCT 23.3* 26.5* 21.0* 21.9* 24.1* 20.2*  MCV 94.0 97.8  --  96.1 94.1 91.8  PLT 165 213  --  180 [id number] 0000000   Metabolic Panel:  Recent Labs Lab 04/07/16 1715 04/10/16 0744 04/10/16 1749 04/10/16 1901 04/11/16 0041 04/12/16 0830  NA 136 134* 133* 132* 134* 130*  K 3.1* 4.9 6.0* 5.4* 3.8 4.6  CL 91* 95*  --  97* 98* 95*  CO2 36* 26  --  25 28 27   GLUCOSE 107* 97 82 78 95 93  BUN <5* 45*  --  51* 11 33*  CREATININE 2.60* 9.97*  --  10.19* 3.68* 7.14*  CALCIUM 8.3* 9.4  --  7.9* 7.4* 8.8*  PHOS  --   --   --   5.0*  --  4.5  ALT  --  36  --   --   --   --   ALKPHOS  --  92  --   --   --   --   BILITOT  --  0.5  --   --   --   --   PROT  --  6.7  --   --   --   --   ALBUMIN  --  3.6  --  2.4*  --  2.2*   Microbiology: BCx - Streptococcus viridans 2 of 2 Wound Cx - rare GPC pairs, growing rare step sanguinis   Medications:   Scheduled Medications: . amLODipine  10 mg Oral Daily  . calcitRIOL  0.75 mcg Oral Q M,W,F-HD  . calcium acetate  1,334 mg Oral TID WC  . carvedilol  25 mg Oral BID WC  . [START ON 04/14/2016] darbepoetin (ARANESP) injection - DIALYSIS  200 mcg Intravenous Q Fri-HD  . docusate sodium  100 mg Oral Daily  . feeding supplement (NEPRO CARB STEADY)  237 mL Oral BID BM  . heparin subcutaneous  5,000 Units Subcutaneous  Q8H  . lisinopril  40 mg Oral QHS  . multivitamin  1 tablet Oral QHS  . pantoprazole  40 mg Oral Daily  . piperacillin-tazobactam (ZOSYN)  IV  3.375 g Intravenous Q12H  . sevelamer carbonate  2,400 mg Oral BID WC   PRN Medications: acetaminophen, alum & mag hydroxide-simeth, guaiFENesin-dextromethorphan, hydrALAZINE, HYDROmorphone (DILAUDID) injection, labetalol, magnesium sulfate 1 - 4 g bolus IVPB, metoprolol, ondansetron, oxyCODONE-acetaminophen, phenol, potassium chloride  Assessment/Plan: Pt is a 30 y.o. yo female with a PMHx of ESRD on HD who was admitted on 04/10/2016 with symptoms of fever and pain of right thigh, which was determined to be secondary to HD graft infection.  Sepsis from AV Graft Infection: WBC resolved, afebrile. BCx w/ strep viridans. Wound Cx w/ few strep sanguinis. Vein mapping complete for eval of new HD access options. - Vasc Surg plan for AVF tomorrow, the DC - ID and Pharmacy recommends Cefazolin at discharge, dosed MWF after dialysis for completion of 14 d course - will repeat BCx for surveillance tomorrow to assess for clearance  Anemia: 2/2 acute BL from surgery and CKD, Hgb 6.6. Transfusion x 2 on 8/29 and 8/30. - follow  H&H, hold transfusions in absence of active bleeding - recommend maintenance Aranesp  ESRD: Last dialysis on 8/30. Temp vasc cath in place. Nephrology following.  HTN: BP controlled. Continue home meds: lisinopril 40mg  qD, amlodipine 10mg  qD, carvedilol 25mg  BID.  Length of Stay: 2 day(s) Dispo: Anticipated discharge in approximately tomorrow after AVF w/ Vasc Surg.  Holley Raring, MD Pager: [phone] (7AM-5PM) 04/12/2016, 6:40 AM

## 2016-04-12 NOTE — Progress Notes (Addendum)
  Progress Note    04/12/2016 7:54 AM 2 Days Post-Op  Subjective:  No complaints  Tm 99.3 now afebrile HR  60's-80's NSR [id number] systolic [id number] RA  Vitals:   04/12/16 0604 04/12/16 0635  BP: 116/76 119/84  Pulse: 76 70  Resp: 16 12  Temp: 98.5 F (36.9 C) 98.7 F (37.1 C)    Physical Exam: Cardiac:  regular Lungs:  Non labored Incisions:  Right groin incision is clean and dry; distal thigh incisions with nylon stitches in tact; minimal serosanguinous drainage on bandage. Extremities:  Easily palpable right DP pulse; right foot is warm.   CBC    Component Value Date/Time   WBC 8.8 04/12/2016 0359   RBC 2.20 (L) 04/12/2016 0359   HGB 6.6 (LL) 04/12/2016 0359   HCT 20.2 (L) 04/12/2016 0359   PLT 158 04/12/2016 0359   MCV 91.8 04/12/2016 0359   MCH 30.0 04/12/2016 0359   MCHC 32.7 04/12/2016 0359   RDW 15.7 (H) 04/12/2016 0359   LYMPHSABS 0.9 04/10/2016 0744   MONOABS 0.9 04/10/2016 0744   EOSABS 0.0 04/10/2016 0744   BASOSABS 0.0 04/10/2016 0744    BMET    Component Value Date/Time   NA 134 (L) 04/11/2016 0041   K 3.8 04/11/2016 0041   CL 98 (L) 04/11/2016 0041   CO2 28 04/11/2016 0041   GLUCOSE 95 04/11/2016 0041   BUN 11 04/11/2016 0041   CREATININE 3.68 (H) 04/11/2016 0041   CALCIUM 7.4 (L) 04/11/2016 0041   GFRNONAA 15 (L) 04/11/2016 0041   GFRAA 18 (L) 04/11/2016 0041    INR    Component Value Date/Time   INR 1.05 11/09/2012 2218     Intake/Output Summary (Last 24 hours) at 04/12/16 0754 Last data filed at 04/12/16 [id number]  Gross per 24 hour  Intake              580 ml  Output                0 ml  Net              580 ml     Assessment:  30 y.o. female is s/p:  HD catheter placement and excision of infected right thigh graft  2 Days Post-Op  Plan: -pt doing well this morning -incisions look good-nylon sutures in place; serosanguinous drainage on bandages.  Groin incision is clean and dry.  Dry bandage placed in groin crease. -try  to minimize tape to skin -DVT prophylaxis:  Heparin SQ -pt to HD this morning -receiving PRBC's for hgb of 6.6   Leontine Locket, PA-C [facility] [phone] 04/12/2016 7:54 AM   I have independently interviewed patient and agree with PA assessment and plan above. Strep in preop blood cultures and gpc's in groin wound that as after antibiotic administration. Appears to have sufficient left arm vein for avf creation. Will plan LUE avf and tunneled dialysis catheter when ready for discharge from medical standpoint. Wounds progressing as expected.  Manuel Lawhead C. Donzetta Matters, MD [facility] [place] Office: [phone] Pager: [phone]

## 2016-04-12 NOTE — Progress Notes (Signed)
Pharmacy Antibiotic Note  Katie Lyons is a 30 y.o. female admitted on 04/10/2016 with sepsis secondary to infected R thigh AV graft. Blood cultures growing Strep viridans. Pharmacy has been consulted for cefazolin dosing.  Per MD, plan was to have regimen able to be given with HD to avoid need for home health. Vancomycin to be replaced with cefazolin to avoid need for therapeutic drug monitoring. Vancomycin 500mg  received post-HD 8/28 and 8/30. Remains afebrile, WBC now WNL.   HD schedule: MWF - last HD 8/30  Plan: -D/C vancomycin  -Start cefazolin 2g qHD on 9/1 -F/U sensitivities (pending)   Height: 5\' 1"  (154.9 cm) Weight: 110 lb 14.3 oz (50.3 kg) (Bedscale) IBW/kg (Calculated) : 47.8  Temp (24hrs), Avg:98.7 F (37.1 C), Min:98.2 F (36.8 C), Max:99.3 F (37.4 C)   Recent Labs Lab 04/07/16 1715 04/10/16 0744 04/10/16 0800 04/10/16 1057 04/10/16 1901 04/11/16 0041 04/11/16 0725 04/12/16 0359 04/12/16 0830  WBC 5.5 19.7*  --   --   --  12.8* 11.1* 8.8  --   CREATININE 2.60* 9.97*  --   --  10.19* 3.68*  --   --  7.14*  LATICACIDVEN  --   --  0.76 0.38*  --   --   --   --   --     Estimated Creatinine Clearance: 8.5 mL/min (by C-G formula based on SCr of 7.14 mg/dL).    No Known Allergies  Antimicrobials this admission: Cefazolin 8/30 >> Vanc 8/28 >> 8/30 Zosyn 8/28 >> 8/30  Microbiology results: 8/28 BCx: Strep viridans 8/28 UCx: sent  8/28 MRSA PCR: neg  Thank you for allowing pharmacy to be a part of this patient's care.  Stephens November, PharmD Clinical Pharmacist 12:08 PM, 04/12/2016

## 2016-04-12 NOTE — Procedures (Signed)
Tol HD via [place], goal 2000, no edema or hemodynamic instability  Assessment/Plan: 1.  Strep Viridans bacteremia/ AV Graft Infection - s/p AVGG removal  2.  ESRD -  [place] MWF - 3.  Hypertension/volume  - BP good in hospital   Rasool Rommel C

## 2016-04-12 NOTE — Discharge Summary (Signed)
Name: Katie Lyons MRN: [id number] DOB: Dec 02, 1985 30 y.o. PCP: Fleet Contras, MD  Date of Admission: 04/10/2016  7:13 AM Date of Discharge: 04/13/2016 Attending Physician: Annia Belt, MD  Discharge Diagnosis: Principal Problem:   Infection of AV graft for dialysis [facility]) Active Problems:   Sepsis ([place])   ESRD on dialysis [facility])  Discharge Medications:   Medication List    TAKE these medications   acetaminophen 500 MG tablet Commonly known as:  TYLENOL Take 500 mg by mouth every 6 (six) hours as needed for mild pain.   amLODipine 10 MG tablet Commonly known as:  NORVASC Take 10 mg by mouth daily.   carvedilol 25 MG tablet Commonly known as:  COREG Take 25 mg by mouth 2 (two) times daily with a meal.   ceFAZolin 2-4 GM/100ML-% IVPB Commonly known as:  ANCEF Inject 100 mLs (2 g total) into the vein every Monday, Wednesday, and Friday with hemodialysis. Start taking on:  04/14/2016   lisinopril 40 MG tablet Commonly known as:  PRINIVIL,ZESTRIL Take 40 mg by mouth at bedtime.   sevelamer carbonate 800 MG tablet Commonly known as:  RENVELA Take 2,400 mg by mouth 2 (two) times daily.      Disposition and follow-up:   Ms.Katie Lyons was discharged from [facility] in Stable condition.  At the hospital follow up visit please address:  Bacteremia 2/2 AV Graft Infection: Good surgical healing? Follow up surveillance blood cultures? Confirm compliance w/ post-dialysis Abx administration. ESRD on HD: Any problems with temporary vasc catheter? Confirm plans for definitive access.  2.  Labs / imaging needed at time of follow-up: CBC, BMP  3. Labs needing follow-up: Surveillance Blood Cultures x 2  Follow-up Appointments: Follow-up Information    [place] Follow up on 05/31/2016.   Why:  9:30 am for hospital follow up Contact information: [address] [place] [phone]         Hospital Course by problem list: Principal Problem:   Infection of AV graft for dialysis [name]) Active Problems:   Sepsis ([place])   ESRD on dialysis ([place])   1. Sepsis and bacteremia secondary to infection of AV graft: Patient presented with 2 day history of fevers, diffuse myalgias, and right thigh pain over her dialysis graft. She was noted to be significantly febrile with temperature of 103 on presentation, had a leukocytosis to 19 and had warmth and erythema around her right leg dialysis access site. Ultrasonography mistreated concern for hematoma but no overt signs of graft obstruction or abscess. Patient was started on empiric antibiotics of vancomycin and Zosyn. Vascular surgery to the patient to the operating room and removed and apparently grossly infected Gore-Tex graft. Temporary vascular catheter was placed in the right IJ. Patient received hemodialysis and electrolytes normalized postoperatively. Patient continued to improve with normalization of leukocytosis and was afebrile for the remainder of her hospitalization stay. Blood cultures grew Streptococcus viridans and wound culture grew rare Streptococcus sanguinis. Dnfectious disease consultants recommended completion of 14 day total course of cefazolin dosed after dialysis Monday, Wednesday, Friday for treatment of her bacteremia. Surveillance blood cultures were drawn on hospital day 3 and she'll be followed up for confirmation of infection clearance in order to prep her for another permanent dialysis access. Her pain was controlled postoperatively and her wound healing appeared appropriate. When stabilized from a surgical standpoint she was discharged with instructions for completion of antibiotic therapy after dialysis.  2. End-stage renal  disease on HD: Patient was last dialyzed on Friday 8/25 without incident. On presentation Monday morning she was due for dialysis that day and had no significant electrolyte abnormalities.  Postoperatively she was noted to have elevated potassium of 6 and was taken to hemodialysis that night through temporary vascular catheter in the right IJ. Electrolytes remained within normal limits and she continued her normal dialysis schedule in house on Wednesday. Patient's hemoglobin continued to trend down over the next 2 days. There was no active bleeding noted from her surgical site and it was thought that this was likely secondary to her renal disease. She received 2 blood transfusions and her hemoglobin stabilized. Vein mapping was completed in house and ordered to facilitate surgical planning. Vascular surgery placed an AVF in LUE on the day of discharge. She was stabilized post-operatively and follow-up was scheduled for monitoring of access maturation.  She was discharged with her temporary vascular catheter until her fistula is ready for use.   Discharge Vitals:   BP (!) 195/102   Pulse 64   Temp 97.9 F (36.6 C) (Tympanic)   Resp 12   Ht 5\' 1"  (1.549 m)   Wt 107 lb 12.9 oz (48.9 kg) Comment: bedscale  LMP 03/24/2016 (Within Days) Comment: reviewed BEFORE imaging  SpO2 100%   BMI 20.37 kg/m   Pertinent Labs, Studies, and Procedures: As above.  Procedures Performed:  [place] Extrem Low Right Ltd  Result Date: 04/10/2016 CLINICAL DATA:  Swelling at site of fistula for 3 days, post revision surgery 01/18/2016, hypertension, end-stage renal disease EXAM: ULTRASOUND RIGHT LOWER EXTREMITY LIMITED TECHNIQUE: Ultrasound examination of the lower extremity soft tissues was performed in the area of clinical concern at the proximal anterior RIGHT thigh. COMPARISON:  None FINDINGS: Patent graft identified at the site of clinical concern. In addition, a large heterogeneous predominately isoechoic collection is seen surrounding the graft, overall measuring 4.9 x 3.4 x 3.8 cm in size. This area contains several linear foci, 1 of which causes a small amount of shadowing, question calcification. This  collection is well-defined and demonstrates no internal blood flow on color Doppler imaging. This could either represent a hematoma or a thrombosed pseudoaneurysm. Minimal scattered subcutaneous edema. IMPRESSION: 4.9 x 3.4 x 3.8 cm diameter well-circumscribed heterogeneous predominately isoechoic collection surrounding a patent graft at the site of clinical concern question hematoma versus thrombosed pseudoaneurysm. Electronically Signed   By: Lavonia Dana M.D.   On: 04/10/2016 11:17   Dg Chest 1v Repeat Same Day  Result Date: 04/10/2016 CLINICAL DATA:  Adjustment of central catheter EXAM: CHEST:  ONE VIEW; INTRAOPERATIVE FLUOROSCOPY COMPARISON:  Study obtained earlier in the day FINDINGS: Central catheter tip is now at the cavoatrial junction. No pneumothorax. There is mild right base atelectasis. Lungs elsewhere clear. Heart is mildly enlarged but stable. The pulmonary vascular is normal. No adenopathy. No bone lesions. IMPRESSION: Central catheter tip at cavoatrial junction. No pneumothorax. Mild right base atelectasis. Lungs elsewhere clear. Stable cardiac prominence. Electronically Signed   By: Lowella Grip III M.D.   On: 04/10/2016 19:11   Consultations: Treatment Team:  Waynetta Sandy, MD Estanislado Emms, MD  Discharge Instructions: Discharge Instructions    Call MD for:  difficulty breathing, headache or visual disturbances    Complete by:  As directed   Call MD for:  persistant dizziness or light-headedness    Complete by:  As directed   Call MD for:  redness, tenderness, or signs of infection (pain, swelling, redness, odor  or green/yellow discharge around incision site)    Complete by:  As directed   Call MD for:  temperature >100.4    Complete by:  As directed   Diet - low sodium heart healthy    Complete by:  As directed   Discharge instructions    Complete by:  As directed   Were found to have an infection of your dialysis access graft. This graft was removed by vascular  surgery. It will take some time for this surgical wound to heal. Vascular surgery will plan to put in another dialysis access but it will take some time for this graft mature. In the meantime he will receive dialysis through the temporary catheter in your neck. We have been treating ear infection with antibiotics and he will continue these antibiotics for 14 days when he leaves the hospital. This medication can be administered at your dialysis appointments. The antibiotic is called cefazolin and should be very effective at treating the bacteria in your blood. She didn't notice any recurrence of fever, or myalgias please let her doctors know immediately. We'll schedule follow-up for you and will be important to your tenderness appointment.   Increase activity slowly    Complete by:  As directed     Signed: Holley Raring, MD 04/13/2016, 1:08 PM   Pager: [phone]

## 2016-04-13 ENCOUNTER — Encounter (HOSPITAL_COMMUNITY)
Admission: EM | Disposition: A | Discharge status: Home / Self Care | Payer: Self-pay | Source: Home / Self Care | Attending: Oncology

## 2016-04-13 ENCOUNTER — Inpatient Hospital Stay (HOSPITAL_COMMUNITY): Payer: Medicaid Other | Admitting: Anesthesiology

## 2016-04-13 ENCOUNTER — Inpatient Hospital Stay (HOSPITAL_COMMUNITY): Payer: Medicaid Other

## 2016-04-13 ENCOUNTER — Encounter (HOSPITAL_COMMUNITY): Payer: Self-pay | Admitting: Surgery

## 2016-04-13 DIAGNOSIS — N185 Chronic kidney disease, stage 5: Secondary | ICD-10-CM

## 2016-04-13 HISTORY — PX: INSERTION OF DIALYSIS CATHETER: SHX1324

## 2016-04-13 HISTORY — PX: AV FISTULA PLACEMENT: SHX1204

## 2016-04-13 LAB — RENAL FUNCTION PANEL
Albumin: 2.3 g/dL — ABNORMAL LOW (ref 3.5–5.0)
Anion gap: 7 (ref 5–15)
BUN: 15 mg/dL (ref 6–20)
CO2: 28 mmol/L (ref 22–32)
Calcium: 9 mg/dL (ref 8.9–10.3)
Chloride: 98 mmol/L — ABNORMAL LOW (ref 101–111)
Creatinine, Ser: 5.99 mg/dL — ABNORMAL HIGH (ref 0.44–1.00)
GFR calc Af Amer: 10 mL/min — ABNORMAL LOW (ref >60)
GFR calc non Af Amer: 8 mL/min — ABNORMAL LOW (ref >60)
Glucose, Bld: 78 mg/dL (ref 65–99)
Phosphorus: 4.8 mg/dL — ABNORMAL HIGH (ref 2.5–4.6)
Potassium: 4 mmol/L (ref 3.5–5.1)
Sodium: 133 mmol/L — ABNORMAL LOW (ref 135–145)

## 2016-04-13 LAB — BASIC METABOLIC PANEL
ANION GAP: 7 (ref 5–15)
BUN: 13 mg/dL (ref 6–20)
CALCIUM: 9 mg/dL (ref 8.9–10.3)
CHLORIDE: 98 mmol/L — AB (ref 101–111)
CO2: 31 mmol/L (ref 22–32)
Creatinine, Ser: 5 mg/dL — ABNORMAL HIGH (ref 0.44–1.00)
GFR calc Af Amer: 12 mL/min — ABNORMAL LOW (ref >60)
GFR calc non Af Amer: 11 mL/min — ABNORMAL LOW (ref >60)
GLUCOSE: 88 mg/dL (ref 65–99)
POTASSIUM: 3.9 mmol/L (ref 3.5–5.1)
Sodium: 136 mmol/L (ref 135–145)

## 2016-04-13 LAB — AEROBIC CULTURE  (SUPERFICIAL SPECIMEN)

## 2016-04-13 LAB — CBC
HCT: 24.7 % — ABNORMAL LOW (ref 36.0–46.0)
HCT: 26.3 % — ABNORMAL LOW (ref 36.0–46.0)
HEMOGLOBIN: 8 g/dL — AB (ref 12.0–15.0)
Hemoglobin: 8.3 g/dL — ABNORMAL LOW (ref 12.0–15.0)
MCH: 29.6 pg (ref 26.0–34.0)
MCH: 29.5 pg (ref 26.0–34.0)
MCHC: 32.4 g/dL (ref 30.0–36.0)
MCHC: 31.6 g/dL (ref 30.0–36.0)
MCV: 91.5 fL (ref 78.0–100.0)
MCV: 93.6 fL (ref 78.0–100.0)
Platelets: 184 10*3/uL (ref 150–400)
Platelets: 212 10*3/uL (ref 150–400)
RBC: 2.7 MIL/uL — ABNORMAL LOW (ref 3.87–5.11)
RBC: 2.81 MIL/uL — ABNORMAL LOW (ref 3.87–5.11)
RDW: 15.8 % — ABNORMAL HIGH (ref 11.5–15.5)
RDW: 15.2 % (ref 11.5–15.5)
WBC: 7.2 10*3/uL (ref 4.0–10.5)
WBC: 5.7 10*3/uL (ref 4.0–10.5)

## 2016-04-13 LAB — TYPE AND SCREEN
ABO/RH(D): O POS
ANTIBODY SCREEN: NEGATIVE
Unit division: 0
Unit division: 0

## 2016-04-13 LAB — CULTURE, BLOOD (ROUTINE X 2)

## 2016-04-13 LAB — AEROBIC CULTURE W GRAM STAIN (SUPERFICIAL SPECIMEN)

## 2016-04-13 LAB — PROTIME-INR
INR: 1.12
PROTHROMBIN TIME: 14.4 s (ref 11.4–15.2)

## 2016-04-13 SURGERY — ARTERIOVENOUS (AV) FISTULA CREATION
Anesthesia: General | Site: Neck | Laterality: Right

## 2016-04-13 MED ORDER — CEFAZOLIN SODIUM-DEXTROSE 2-4 GM/100ML-% IV SOLN: 2.0000 g | INTRAVENOUS | 11 refills | Status: AC

## 2016-04-13 MED ORDER — PROPOFOL 10 MG/ML IV BOLUS
INTRAVENOUS | Status: DC | PRN
  Administered 2016-04-13: 30 mg via INTRAVENOUS
  Administered 2016-04-13: 130 mg via INTRAVENOUS

## 2016-04-13 MED ORDER — HYDROMORPHONE HCL 1 MG/ML IJ SOLN
0.2500 mg | INTRAMUSCULAR | Status: DC | PRN
  Administered 2016-04-13 (×2): 0.5 mg via INTRAVENOUS

## 2016-04-13 MED ORDER — EPHEDRINE 5 MG/ML INJ
INTRAVENOUS | Status: AC
  Filled 2016-04-13: qty 10

## 2016-04-13 MED ORDER — HEPARIN SODIUM (PORCINE) 1000 UNIT/ML IJ SOLN
INTRAMUSCULAR | Status: DC | PRN
  Administered 2016-04-13: 3 mL

## 2016-04-13 MED ORDER — ONDANSETRON HCL 4 MG/2ML IJ SOLN
INTRAMUSCULAR | Status: DC | PRN
  Administered 2016-04-13: 4 mg via INTRAVENOUS

## 2016-04-13 MED ORDER — FENTANYL CITRATE (PF) 100 MCG/2ML IJ SOLN
INTRAMUSCULAR | Status: DC | PRN
  Administered 2016-04-13: 75 ug via INTRAVENOUS
  Administered 2016-04-13 (×2): 25 ug via INTRAVENOUS

## 2016-04-13 MED ORDER — PROPOFOL 10 MG/ML IV BOLUS
INTRAVENOUS | Status: AC
  Filled 2016-04-13: qty 20

## 2016-04-13 MED ORDER — LIDOCAINE-EPINEPHRINE (PF) 1 %-1:200000 IJ SOLN
INTRAMUSCULAR | Status: DC | PRN
  Administered 2016-04-13: 4 mL

## 2016-04-13 MED ORDER — LIDOCAINE HCL (CARDIAC) 20 MG/ML IV SOLN
INTRAVENOUS | Status: DC | PRN
  Administered 2016-04-13: 100 mg via INTRAVENOUS

## 2016-04-13 MED ORDER — LIDOCAINE-EPINEPHRINE (PF) 1 %-1:200000 IJ SOLN
INTRAMUSCULAR | Status: AC
  Filled 2016-04-13: qty 30

## 2016-04-13 MED ORDER — HEPARIN SODIUM (PORCINE) 1000 UNIT/ML DIALYSIS
1000.0000 [IU] | INTRAMUSCULAR | Status: DC | PRN
  Filled 2016-04-13: qty 1

## 2016-04-13 MED ORDER — SODIUM CHLORIDE 0.9 % IV SOLN
INTRAVENOUS | Status: DC | PRN
  Administered 2016-04-13: 500 mL

## 2016-04-13 MED ORDER — 0.9 % SODIUM CHLORIDE (POUR BTL) OPTIME
TOPICAL | Status: DC | PRN
  Administered 2016-04-13: 1000 mL

## 2016-04-13 MED ORDER — ONDANSETRON HCL 4 MG/2ML IJ SOLN
INTRAMUSCULAR | Status: AC
  Filled 2016-04-13: qty 2

## 2016-04-13 MED ORDER — SODIUM CHLORIDE 0.9 % IV SOLN: 100.0000 mL | INTRAVENOUS | Status: DC | PRN

## 2016-04-13 MED ORDER — MIDAZOLAM HCL 5 MG/5ML IJ SOLN
INTRAMUSCULAR | Status: DC | PRN
  Administered 2016-04-13: 2 mg via INTRAVENOUS

## 2016-04-13 MED ORDER — ALTEPLASE 2 MG IJ SOLR: 2.0000 mg | Freq: Once | INTRAMUSCULAR | Status: DC | PRN

## 2016-04-13 MED ORDER — FENTANYL CITRATE (PF) 250 MCG/5ML IJ SOLN
INTRAMUSCULAR | Status: AC
  Filled 2016-04-13: qty 5

## 2016-04-13 MED ORDER — HYDROMORPHONE HCL 1 MG/ML IJ SOLN
INTRAMUSCULAR | Status: AC
  Administered 2016-04-13: 0.5 mg via INTRAVENOUS
  Filled 2016-04-13: qty 1

## 2016-04-13 MED ORDER — MIDAZOLAM HCL 2 MG/2ML IJ SOLN
INTRAMUSCULAR | Status: AC
  Filled 2016-04-13: qty 2

## 2016-04-13 MED ORDER — HEMOSTATIC AGENTS (NO CHARGE) OPTIME
TOPICAL | Status: DC | PRN
  Administered 2016-04-13: 1 via TOPICAL

## 2016-04-13 MED ORDER — HEPARIN SODIUM (PORCINE) 1000 UNIT/ML IJ SOLN
INTRAMUSCULAR | Status: AC
  Filled 2016-04-13: qty 1

## 2016-04-13 MED ORDER — PROMETHAZINE HCL 25 MG/ML IJ SOLN: 6.2500 mg | INTRAMUSCULAR | Status: DC | PRN

## 2016-04-13 MED ORDER — EPHEDRINE SULFATE 50 MG/ML IJ SOLN
INTRAMUSCULAR | Status: DC | PRN
  Administered 2016-04-13 (×2): 5 mg via INTRAVENOUS

## 2016-04-13 MED ORDER — LIDOCAINE HCL (PF) 1 % IJ SOLN: 5.0000 mL | INTRAMUSCULAR | Status: DC | PRN

## 2016-04-13 MED ORDER — PENTAFLUOROPROP-TETRAFLUOROETH EX AERO: 1.0000 "application " | INHALATION_SPRAY | CUTANEOUS | Status: DC | PRN

## 2016-04-13 MED ORDER — SODIUM CHLORIDE 0.9 % IV SOLN
INTRAVENOUS | Status: DC
  Administered 2016-04-13 (×2): via INTRAVENOUS

## 2016-04-13 MED ORDER — LIDOCAINE-PRILOCAINE 2.5-2.5 % EX CREA
1.0000 | TOPICAL_CREAM | CUTANEOUS | Status: DC | PRN
Start: 2016-04-13 — End: 2016-04-15

## 2016-04-13 MED ORDER — IOPAMIDOL (ISOVUE-300) INJECTION 61%
INTRAVENOUS | Status: AC
  Filled 2016-04-13: qty 50

## 2016-04-13 SURGICAL SUPPLY — 49 items
ARMBAND PINK RESTRICT EXTREMIT (MISCELLANEOUS) ×3 IMPLANT
BAG BANDED W/RUBBER/TAPE 36X54 (MISCELLANEOUS) ×1 IMPLANT
BAG EQP BAND 135X91 W/RBR TAPE (MISCELLANEOUS) ×2
BIOPATCH RED 1 DISK 7.0 (GAUZE/BANDAGES/DRESSINGS) ×3 IMPLANT
CANISTER SUCTION 2500CC (MISCELLANEOUS) ×3 IMPLANT
CATH PALINDROME RT-P 15FX19CM (CATHETERS) ×1 IMPLANT
CLIP TI MEDIUM 6 (CLIP) ×3 IMPLANT
CLIP TI WIDE RED SMALL 6 (CLIP) ×3 IMPLANT
COVER DOME SNAP 22 D (MISCELLANEOUS) ×1 IMPLANT
COVER PROBE W GEL 5X96 (DRAPES) ×3 IMPLANT
DRAPE CHEST BREAST 15X10 FENES (DRAPES) ×3 IMPLANT
ELECT REM PT RETURN 9FT ADLT (ELECTROSURGICAL) ×3
ELECTRODE REM PT RTRN 9FT ADLT (ELECTROSURGICAL) ×2 IMPLANT
GAUZE SPONGE 2X2 8PLY STRL LF (GAUZE/BANDAGES/DRESSINGS) IMPLANT
GAUZE SPONGE 4X4 16PLY XRAY LF (GAUZE/BANDAGES/DRESSINGS) ×2 IMPLANT
GLOVE BIO SURGEON STRL SZ 6.5 (GLOVE) ×1 IMPLANT
GLOVE BIO SURGEON STRL SZ7.5 (GLOVE) ×3 IMPLANT
GLOVE BIOGEL PI IND STRL 6.5 (GLOVE) IMPLANT
GLOVE BIOGEL PI IND STRL 7.0 (GLOVE) IMPLANT
GLOVE BIOGEL PI INDICATOR 6.5 (GLOVE) ×4
GLOVE BIOGEL PI INDICATOR 7.0 (GLOVE) ×2
GLOVE ECLIPSE 6.5 STRL STRAW (GLOVE) ×3 IMPLANT
GLOVE SURG SS PI 6.5 STRL IVOR (GLOVE) ×1 IMPLANT
GOWN STRL REUS W/ TWL LRG LVL3 (GOWN DISPOSABLE) ×4 IMPLANT
GOWN STRL REUS W/ TWL XL LVL3 (GOWN DISPOSABLE) ×2 IMPLANT
GOWN STRL REUS W/TWL LRG LVL3 (GOWN DISPOSABLE) ×12
GOWN STRL REUS W/TWL XL LVL3 (GOWN DISPOSABLE) ×6
HEMOSTAT SNOW SURGICEL 2X4 (HEMOSTASIS) ×1 IMPLANT
KIT BASIN OR (CUSTOM PROCEDURE TRAY) ×3 IMPLANT
KIT ROOM TURNOVER OR (KITS) ×3 IMPLANT
LIQUID BAND (GAUZE/BANDAGES/DRESSINGS) ×4 IMPLANT
NDL 18GX1X1/2 (RX/OR ONLY) (NEEDLE) ×2 IMPLANT
NEEDLE 18GX1X1/2 (RX/OR ONLY) (NEEDLE) ×3 IMPLANT
NS IRRIG 1000ML POUR BTL (IV SOLUTION) ×3 IMPLANT
PACK CV ACCESS (CUSTOM PROCEDURE TRAY) ×3 IMPLANT
PAD ARMBOARD 7.5X6 YLW CONV (MISCELLANEOUS) ×6 IMPLANT
SOAP 2 % CHG 4 OZ (WOUND CARE) ×3 IMPLANT
SPONGE GAUZE 2X2 STER 10/PKG (GAUZE/BANDAGES/DRESSINGS)
SUT ETHILON 3 0 PS 1 (SUTURE) ×3 IMPLANT
SUT MNCRL AB 4-0 PS2 18 (SUTURE) ×3 IMPLANT
SUT PROLENE 6 0 BV (SUTURE) ×5 IMPLANT
SUT VIC AB 3-0 SH 27 (SUTURE) ×3
SUT VIC AB 3-0 SH 27X BRD (SUTURE) ×2 IMPLANT
SYR 20CC LL (SYRINGE) ×5 IMPLANT
SYR 5ML LL (SYRINGE) ×3 IMPLANT
SYRINGE 10CC LL (SYRINGE) ×3 IMPLANT
TOWEL OR 17X24 6PK STRL BLUE (TOWEL DISPOSABLE) ×1 IMPLANT
UNDERPAD 30X30 (UNDERPADS AND DIAPERS) ×3 IMPLANT
WATER STERILE IRR 1000ML POUR (IV SOLUTION) ×3 IMPLANT

## 2016-04-13 NOTE — Progress Notes (Signed)
PT Cancellation Note  Patient Details Name: Katie Lyons MRN: [id number] DOB: [birth year]   Cancelled Treatment:    Reason Eval/Treat Not Completed: Patient at procedure or test/unavailable. Pt in OR for left upper arm avf vs graft with tunneled catheter placement.   Lorriane Shire 04/13/2016, 9:10 AM

## 2016-04-13 NOTE — Progress Notes (Signed)
OT Cancellation Note  Patient Details Name: Katie Lyons MRN: [id number] DOB: [birth year]   Cancelled Treatment:    Reason Eval/Treat Not Completed: Patient at procedure or test/ unavailable. Pt in OR for left upper arm avf vs graft with tunneled catheter placement.  Redmond Baseman, OTR/L Pager: [phone] 04/13/2016, 10:57 AM

## 2016-04-13 NOTE — Progress Notes (Signed)
Patient ID: Katie Lyons, female   DOB: 04/08/1986, 30 y.o.   MRN: [id number]  [facility] Progress Note    Subjective:   Somnolent, no distress   Objective:   BP (!) 142/92   Pulse 66   Temp 97.8 F (36.6 C)   Resp 15   Ht 5\' 1"  (1.549 m)   Wt 48.9 kg (107 lb 12.9 oz) Comment: bedscale  LMP 03/24/2016 (Within Days) Comment: reviewed BEFORE imaging  SpO2 100%   BMI 20.37 kg/m   Intake/Output: I/O last 3 completed shifts: In: 1087.5 [P.O.:720; I.V.:50; Blood:267.5; IV Piggyback:50] Out: 1500 [Other:1500]   Intake/Output this shift:  Total I/O In: 600 [I.V.:600] Out: 50 [Blood:50] Weight change:   Physical Exam: Gen:WD HF in NAD CVS:no rub, +flow murmer at LUSB and precordium, nonradiating Resp:cta [id number] Ext: s/p left brachiocephalic AVF +T/B, right thigh avg removed, +edema of right thigh  Labs: BMET  Recent Labs Lab 04/07/16 1715 04/10/16 0744 04/10/16 1749 04/10/16 1901 04/11/16 0041 04/12/16 0830 04/13/16 0407  NA 136 134* 133* 132* 134* 130* 136  K 3.1* 4.9 6.0* 5.4* 3.8 4.6 3.9  CL 91* 95*  --  97* 98* 95* 98*  CO2 36* 26  --  25 28 27 31   GLUCOSE 107* 97 82 78 95 93 88  BUN <5* 45*  --  51* 11 33* 13  CREATININE 2.60* 9.97*  --  10.19* 3.68* 7.14* 5.00*  ALBUMIN  --  3.6  --  2.4*  --  2.2*  --   CALCIUM 8.3* 9.4  --  7.9* 7.4* 8.8* 9.0  PHOS  --   --   --  5.0*  --  4.5  --    CBC  Recent Labs Lab 04/10/16 0744  04/11/16 0041 04/11/16 0725 04/12/16 0359 04/13/16 0407  WBC 19.7*  --  12.8* 11.1* 8.8 7.2  NEUTROABS 17.9*  --   --   --   --   --   HGB 8.3*  < > 6.8* 7.7* 6.6* 8.0*  HCT 26.5*  < > 21.9* 24.1* 20.2* 24.7*  MCV 97.8  --  96.1 94.1 91.8 91.5  PLT 213  --  180 170 158 184  < > = values in this interval not displayed.  @IMGRELPRIORS @ Medications:    . amLODipine  10 mg Oral Daily  . calcitRIOL  0.75 mcg Oral Q M,W,F-HD  . calcium acetate  1,334 mg Oral TID WC  . carvedilol  25 mg Oral BID  WC  . [START ON 04/14/2016]  ceFAZolin (ANCEF) IV  2 g Intravenous Q M,W,F-HD  . [START ON 04/14/2016] darbepoetin (ARANESP) injection - DIALYSIS  200 mcg Intravenous Q Fri-HD  . docusate sodium  100 mg Oral Daily  . feeding supplement (NEPRO CARB STEADY)  237 mL Oral BID BM  . heparin subcutaneous  5,000 Units Subcutaneous Q8H  . lisinopril  40 mg Oral QHS  . multivitamin  1 tablet Oral QHS  . pantoprazole  40 mg Oral Daily  . sevelamer carbonate  2,400 mg Oral BID WC    Dialysis Orders: [place] M[facility] 3:45 160F 400/A 1.5x EDW 46.5kg RLE AVG 2000 Heparin (will hold post procedure) Aranesp 200 mcg IV q week (last dose 8/25)  Calcitriol 0.66mcg PO q treatment  OP Labs: Hgb 7.3 Tsat 34% K 5.2 Ca 9.6 P 5.6  iPTH 485   Assessment/ Plan:   1. AV Graft infection, right thigh- s/p removal today by Dr. Scot Dock.  On  IV Vanc/Zosyn 2. ESRD continue with HD qMWF, for HD tomorrow 3. Anemia:on Aranesp 234mcg iv.  ABLA on anemia of chronic disease.  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 Nutrition: renal diet 6. Hypertension: stable 7. Vascular access- has new LUE brachiocephalic avf and right IJ tdc  Donetta Potts, MD [place] Pager [phone] 04/13/2016, 4:56 PM

## 2016-04-13 NOTE — Anesthesia Postprocedure Evaluation (Signed)
Anesthesia Post Note  Patient: [name], television/film/video  Procedure(s) Performed: Procedure(s) (LRB): UPPER EXTREMITY LEFT  ARTERIOVENOUS (AV) FISTULA CREATION (Left) INSERTION OF DIALYSIS CATHETER RIGHT INTERNAL JUGULAR (Right)  Patient location during evaluation: PACU Anesthesia Type: General Level of consciousness: sedated Pain management: pain level controlled Vital Signs Assessment: post-procedure vital signs reviewed and stable Respiratory status: spontaneous breathing and respiratory function stable Cardiovascular status: stable Anesthetic complications: no    Last Vitals:  Vitals:   04/13/16 1545 04/13/16 1600  BP: 130/89 136/85  Pulse:    Resp:    Temp:  36.6 C    Last Pain:  Vitals:   04/13/16 1600  TempSrc:   PainSc: 2                  Katie Lyons DANIEL

## 2016-04-13 NOTE — Anesthesia Procedure Notes (Signed)
Procedure Name: LMA Insertion Date/Time: 04/13/2016 1:21 PM Performed by: Carney Living Pre-anesthesia Checklist: Patient identified, Suction available, Emergency Drugs available, Patient being monitored and Timeout performed Patient Re-evaluated:Patient Re-evaluated prior to inductionOxygen Delivery Method: Circle system utilized Preoxygenation: Pre-oxygenation with 100% oxygen Intubation Type: IV induction LMA: LMA inserted LMA Size: 4.0 Number of attempts: 1 Placement Confirmation: positive ETCO2 and breath sounds checked- equal and bilateral Tube secured with: Tape Dental Injury: Teeth and Oropharynx as per pre-operative assessment

## 2016-04-13 NOTE — Op Note (Signed)
OPERATIVE NOTE  PROCEDURE: 1. Right internal jugular vein tunneled dialysis catheter placement with US guidance 2. Left arm brachial-cephalic av fistula creation  PRE-OPERATIVE DIAGNOSIS: end-stage renal failure  POST-OPERATIVE DIAGNOSIS: same as above  SURGEON: Jaymie Mckiddy C. Donzetta Matters, MD  Assistant: Virgina Jock, PA  ANESTHESIA: general  ESTIMATED BLOOD LOSS: 30 cc  FINDING(S): 1.  Tips of the catheter in the right atrium on fluoroscopy 2.  No obvious pneumothorax on fluoroscopy 3.  Fistula with thrill in R upper arm, signal at radial artery  SPECIMEN(S):  none  INDICATIONS:   Katie Lyons is a 30 y.o. female who presents with end stage renal disease.  She has recently undergone right femoral AV graft excision with placement of temporary right IJ dialysis catheter placement. She now presents for tunneled dialysis catheter placement and definitive left arm access  DESCRIPTION: After written full informed consent was obtained from the patient, she was taken back to the operating room.  Prior to induction, the patient was given IV antibiotics.  After obtaining adequate sedation, the patient was prepped and draped in the standard fashion for a chest or neck tunneled dialysis catheter placement.  Under ultrasound guidance, the right internal jugular vein was cannulated with the 18 gauge needle.  A J-wire was then placed down into the inferior vena cava under fluoroscopic guidance.  I then made stab incisions at the neck and exit sites.   I dissected from the exit site to the cannulation site with a tunneler.   The subcutaneous tunnel was dilated by passing a plastic dilator over the metal dissector.  The skin tract and venotomy was dilated serially with dilators.  Finally, the dilator-sheath was placed under fluoroscopic guidance into the superior vena cava.  The dilator and wire were removed.  A 19 cm Diatek catheter was placed under fluoroscopic guidance down into the right  atrium.  The sheath was broken and peeled away while holding the catheter cuff at the level of the skin.  The back end of this catheter was transected, and docked onto the tunneler.  The distal catheter was delivered through the subcutaneous tunnel.  The catheter was transected a second time, revealing the two lumens of this catheter.  The ports were docked onto these two lumens.  The catheter collar was then snapped into place.  Each port was tested by aspirating and flushing.  No resistance was noted.  Each port was then thoroughly flushed with heparinized saline.  The catheter was secured in placed with two interrupted stitches of 3-0 Nylon tied to the catheter.  The neck incision was closed with a U-stitch of 4-0 Monocryl.  The neck and chest incision were cleaned and sterile bandages applied.  Each port was then loaded with concentrated heparin and sterile caps were applied to each port.  On completion fluoroscopy, the tips of the catheter were in the right atrium, and there was no evidence of pneumothorax.  We then turned attention to the AV fistula portion of the case. An incision was made at the level of just below the antecubital fossa and subcutaneous tissue divided identified the cephalic vein which was a common trunk to the basilic vein. This was dissected out cephalad for several centimeters as well as distally as well. Then turned our attention to the radial artery after dividing the brachial radialis tendon dissected the artery away from the vein and nerve protecting both of those structures encircled with vessel loop. Then divided the vein distally and ligated the takeoff  of the basilic. The vein was dilated up to 4 mm flushed with hep saline and clamped with bulldog. The artery was then clamped open longitudinally had saline infused proximally and distally. The patient was created by sewing the vein end-to-side with 6-0 Prolene. We flushed prior to releasing our clamps after releasing our clamps  that had pulsatility in the cephalic vein. There was a defect in the vein noted from prior IV site and this was repaired with 6-0 Prolene suture. Doppler demonstrated continuous flow in the vein and a signal of the left radial artery that augmented with compression of the vein. Satisfied we obtained hemostasis and closed the subcutaneous tissue with 3-0 Vicryl and skin with 4-0 Monocryl and Dermabond was placed above that. Patient was awakened from anesthesia having tolerated the procedure well and transferred to the PACU in stable condition.  COMPLICATIONS: none  CONDITION: stable   Joda Braatz C. Donzetta Matters, MD [facility] Office: [phone] Pager: [phone]  04/13/2016, 3:46 PM

## 2016-04-13 NOTE — Anesthesia Preprocedure Evaluation (Addendum)
Anesthesia Evaluation  Patient identified by MRN, date of birth, ID band Patient awake    Reviewed: Allergy & Precautions, NPO status , Patient's Chart, lab work & pertinent test results, reviewed documented beta blocker date and time   History of Anesthesia Complications Negative for: history of anesthetic complications  Airway Mallampati: II  TM Distance: >3 FB Neck ROM: Full    Dental  (+) Teeth Intact, Dental Advisory Given   Pulmonary neg pulmonary ROS,    Pulmonary exam normal        Cardiovascular hypertension, Pt. on medications and Pt. on home beta blockers Normal cardiovascular exam     Neuro/Psych  Headaches, PSYCHIATRIC DISORDERS Anxiety Depression negative neurological ROS     GI/Hepatic negative GI ROS, Neg liver ROS,   Endo/Other  negative endocrine ROS  Renal/GU ESRF and DialysisRenal disease     Musculoskeletal   Abdominal   Peds  Hematology  (+) anemia ,   Anesthesia Other Findings   Reproductive/Obstetrics                          Anesthesia Physical Anesthesia Plan  ASA: III  Anesthesia Plan: General   Post-op Pain Management:    Induction: Intravenous  Airway Management Planned: LMA  Additional Equipment:   Intra-op Plan: Utilization Of Total Body Hypothermia per surgeon request  Post-operative Plan: Extubation in OR  Informed Consent: I have reviewed the patients History and Physical, chart, labs and discussed the procedure including the risks, benefits and alternatives for the proposed anesthesia with the patient or authorized representative who has indicated his/her understanding and acceptance.   Dental advisory given  Plan Discussed with: CRNA, Anesthesiologist and Surgeon  Anesthesia Plan Comments:      Anesthesia Quick Evaluation                                   Anesthesia Evaluation  Patient identified by MRN, date of birth, ID band Patient  awake    Reviewed: Allergy & Precautions, NPO status , Patient's Chart, lab work & pertinent test results  History of Anesthesia Complications Negative for: history of anesthetic complications  Airway Mallampati: II  TM Distance: >3 FB Neck ROM: Full    Dental no notable dental hx. (+) Dental Advisory Given   Pulmonary neg pulmonary ROS,    Pulmonary exam normal        Cardiovascular hypertension, Pt. on medications Normal cardiovascular exam     Neuro/Psych PSYCHIATRIC DISORDERS Anxiety Depression TBI negative psych ROS   GI/Hepatic negative GI ROS, Neg liver ROS,   Endo/Other  negative endocrine ROS  Renal/GU ESRF and DialysisRenal disease  negative genitourinary   Musculoskeletal negative musculoskeletal ROS (+)   Abdominal   Peds negative pediatric ROS (+)  Hematology negative hematology ROS (+)   Anesthesia Other Findings   Reproductive/Obstetrics negative OB ROS                            Anesthesia Physical Anesthesia Plan  ASA: III  Anesthesia Plan: General   Post-op Pain Management:    Induction: Intravenous  Airway Management Planned: LMA  Additional Equipment:   Intra-op Plan:   Post-operative Plan: Extubation in OR  Informed Consent: I have reviewed the patients History and Physical, chart, labs and discussed the procedure including the risks, benefits and alternatives for the  proposed anesthesia with the patient or authorized representative who has indicated his/her understanding and acceptance.   Dental advisory given  Plan Discussed with: Anesthesiologist and CRNA  Anesthesia Plan Comments: (Interpreter was used for the interview)       Anesthesia Quick Evaluation

## 2016-04-13 NOTE — Progress Notes (Signed)
Notified Dr. Tobias Alexander of elevated BP

## 2016-04-13 NOTE — Progress Notes (Signed)
  Progress Note    04/13/2016 7:21 AM 3 Days Post-Op  Subjective:  Feels well this a.m.  Vitals:   04/12/16 2338 04/13/16 0353  BP: (!) 145/98 (!) 146/96  Pulse: 77 66  Resp: (!) 25 10  Temp: 98.4 F (36.9 C) 98 F (36.7 C)    Physical Exam: Cardiac:  rrr Lungs:  Non labored Ext: palpable left radial pulse R groin wound dressing cdi  CBC    Component Value Date/Time   WBC 7.2 04/13/2016 0407   RBC 2.70 (L) 04/13/2016 0407   HGB 8.0 (L) 04/13/2016 0407   HCT 24.7 (L) 04/13/2016 0407   PLT 184 04/13/2016 0407   MCV 91.5 04/13/2016 0407   MCH 29.6 04/13/2016 0407   MCHC 32.4 04/13/2016 0407   RDW 15.8 (H) 04/13/2016 0407   LYMPHSABS 0.9 04/10/2016 0744   MONOABS 0.9 04/10/2016 0744   EOSABS 0.0 04/10/2016 0744   BASOSABS 0.0 04/10/2016 0744    BMET    Component Value Date/Time   NA 136 04/13/2016 0407   K 3.9 04/13/2016 0407   CL 98 (L) 04/13/2016 0407   CO2 31 04/13/2016 0407   GLUCOSE 88 04/13/2016 0407   BUN 13 04/13/2016 0407   CREATININE 5.00 (H) 04/13/2016 0407   CALCIUM 9.0 04/13/2016 0407   GFRNONAA 11 (L) 04/13/2016 0407   GFRAA 12 (L) 04/13/2016 0407    INR    Component Value Date/Time   INR 1.12 04/13/2016 0407     Intake/Output Summary (Last 24 hours) at 04/13/16 0721 Last data filed at 04/12/16 1400  Gross per 24 hour  Intake            527.5 ml  Output             1500 ml  Net           -972.5 ml     Assessment:  30 y.o. female is s/p R femoral av graft excision and placement temp hd catheter  Plan: -OR today for left upper arm avf vs graft with tunneled catheter placement -consented with interpreter yesterday, patient demonstrated understanding of procedure   Brandon C. Donzetta Matters, MD [facility] [place] Office: [phone] Pager: [phone]  04/13/2016 7:21 AM

## 2016-04-13 NOTE — Transfer of Care (Signed)
Immediate Anesthesia Transfer of Care Note  Patient: Katie Lyons  Procedure(s) Performed: Procedure(s): UPPER EXTREMITY LEFT  ARTERIOVENOUS (AV) FISTULA CREATION (Left) INSERTION OF DIALYSIS CATHETER RIGHT INTERNAL JUGULAR (Right)  Patient Location: PACU  Anesthesia Type:General  Level of Consciousness: awake, alert  and oriented  Airway & Oxygen Therapy: Patient Spontanous Breathing and Patient connected to nasal cannula oxygen  Post-op Assessment: Report given to RN, Post -op Vital signs reviewed and stable and Patient moving all extremities X 4  Post vital signs: Reviewed and stable  Last Vitals:  Vitals:   04/13/16 0807 04/13/16 0926  BP: (!) 173/105 (!) 195/102  Pulse:    Resp:    Temp:      Last Pain:  Vitals:   04/13/16 0840  TempSrc:   PainSc: 0-No pain      Patients Stated Pain Goal: 3 ([id number])  Complications: No apparent anesthesia complications

## 2016-04-13 NOTE — Progress Notes (Signed)
Subjective: Currently, the patient was examined s/p AVF in LUE in the PACU. She was asleep but easily arousable in NAD.   Interval Events: HD yesterday. AVF in LUE this AM.  Objective: Vital signs in last 24 hours: Vitals:   04/12/16 1500 04/12/16 1906 04/12/16 2338 04/13/16 0353  BP: 130/88 (!) 136/94 (!) 145/98 (!) 146/96  Pulse: 67 70 77 66  Resp: 12 17 (!) 25 10  Temp: 97.9 F (36.6 C) 98.2 F (36.8 C) 98.4 F (36.9 C) 98 F (36.7 C)  TempSrc: Oral Oral Oral Oral  SpO2: 100% 100% 100% 95%  Weight:      Height:       Physical Exam: Physical Exam  Constitutional: She appears well-developed and well-nourished. She is cooperative. No distress.  HENT:  Head: Normocephalic and atraumatic.  Nose: Nose normal.  Cardiovascular: Normal rate, regular rhythm and normal pulses.  Exam reveals no gallop and no friction rub.   Murmur (3/6 LLSB murmur radiating to left axila ) heard. Pulmonary/Chest: Effort normal. No respiratory distress. Breasts are symmetrical.  Abdominal: Soft. There is no tenderness. There is no rebound and no guarding.  Musculoskeletal: She exhibits no edema. Tenderness: Right thigh surgical site, clean, dry, bandaged.  LUE AVF incision site intact w/o drainage, 2+ left radial pulse, no cyanosis. RLE groin wound intact w/o signs of infection.    Skin: Skin is warm and dry.   Labs: CBC:  Recent Labs Lab 04/10/16 0744 04/10/16 1749 04/11/16 0041 04/11/16 0725 04/12/16 0359 04/13/16 0407  WBC 19.7*  --  12.8* 11.1* 8.8 7.2  NEUTROABS 17.9*  --   --   --   --   --   HGB 8.3* 7.1* 6.8* 7.7* 6.6* 8.0*  HCT 26.5* 21.0* 21.9* 24.1* 20.2* 24.7*  MCV 97.8  --  96.1 94.1 91.8 91.5  PLT 213  --  180 170 0000000 [id number]   Metabolic Panel:  Recent Labs Lab 04/10/16 0744 04/10/16 1749 04/10/16 1901 04/11/16 0041 04/12/16 0830 04/13/16 0407  NA 134* 133* 132* 134* 130* 136  K 4.9 6.0* 5.4* 3.8 4.6 3.9  CL 95*  --  97* 98* 95* 98*  CO2 26  --  25 28 27 31     GLUCOSE 97 82 78 95 93 88  BUN 45*  --  51* 11 33* 13  CREATININE 9.97*  --  10.19* 3.68* 7.14* 5.00*  CALCIUM 9.4  --  7.9* 7.4* 8.8* 9.0  PHOS  --   --  5.0*  --  4.5  --   ALT 36  --   --   --   --   --   ALKPHOS 92  --   --   --   --   --   BILITOT 0.5  --   --   --   --   --   PROT 6.7  --   --   --   --   --   ALBUMIN 3.6  --  2.4*  --  2.2*  --   LABPROT  --   --   --   --   --  14.4  INR  --   --   --   --   --  1.12   Microbiology: BCx and WCx - Streptococcus sanguinis, pan-sensative BCx 8/31 >    Medications:   Scheduled Medications: . amLODipine  10 mg Oral Daily  . calcitRIOL  0.75 mcg Oral Q M,W,F-HD  .  calcium acetate  1,334 mg Oral TID WC  . carvedilol  25 mg Oral BID WC  . [START ON 04/14/2016]  ceFAZolin (ANCEF) IV  2 g Intravenous Q M,W,F-HD  . cefUROXime (ZINACEF)  IV  1.5 g Intravenous On Call to OR  . [START ON 04/14/2016] darbepoetin (ARANESP) injection - DIALYSIS  200 mcg Intravenous Q Fri-HD  . docusate sodium  100 mg Oral Daily  . feeding supplement (NEPRO CARB STEADY)  237 mL Oral BID BM  . heparin subcutaneous  5,000 Units Subcutaneous Q8H  . lisinopril  40 mg Oral QHS  . multivitamin  1 tablet Oral QHS  . pantoprazole  40 mg Oral Daily  . sevelamer carbonate  2,400 mg Oral BID WC   PRN Medications: acetaminophen, alum & mag hydroxide-simeth, guaiFENesin-dextromethorphan, hydrALAZINE, HYDROmorphone (DILAUDID) injection, labetalol, magnesium sulfate 1 - 4 g bolus IVPB, metoprolol, ondansetron, oxyCODONE-acetaminophen, phenol, potassium chloride  Assessment/Plan: Pt is a 30 y.o. yo female with a PMHx of ESRD on HD who was admitted on 04/10/2016 with symptoms of fever and pain of right thigh, which was determined to be secondary to HD graft infection.  Sepsis from AV Graft Infection: WBC resolved, afebrile. BCx/WCx w/ PAN SENSATIVE strep sanguinis. Vein mapping complete for eval of new HD access options. - s/p LUE AVF today w/o any complications - ID  and Pharmacy recommends Cefazolin at discharge, dosed MWF after dialysis for completion of 14 d course - repeat BCx taken today - new LLSB murmur heard on exam today, concern for endocarditis. It could also potentially be a flow murmur. Will order a TTE to further evaluate this.   Anemia: 2/2 acute BL from surgery and CKD, Hgb 9.0. Transfusion x 2 on 8/29 and 8/30. Discussed with nephrology when her next EPO dose is due.  - CBC in the am  ESRD: Last dialysis on 8/30. Temp vasc cath in place. Nephrology following.  HTN: BP controlled. Continue home meds: lisinopril 40mg  qD, amlodipine 10mg  qD, carvedilol 25mg  BID.  Length of Stay: 3 day(s) Dispo: Anticipated discharge in 1-2 days pending ECHO results  Holley Raring, MD Pager: [phone] (7AM-5PM) 04/13/2016, 6:45 AM

## 2016-04-14 ENCOUNTER — Other Ambulatory Visit (HOSPITAL_COMMUNITY): Payer: Self-pay

## 2016-04-14 ENCOUNTER — Encounter (HOSPITAL_COMMUNITY): Payer: Self-pay | Admitting: Vascular Surgery

## 2016-04-14 ENCOUNTER — Telehealth: Payer: Self-pay | Admitting: Vascular Surgery

## 2016-04-14 DIAGNOSIS — R011 Cardiac murmur, unspecified: Secondary | ICD-10-CM

## 2016-04-14 DIAGNOSIS — B954 Other streptococcus as the cause of diseases classified elsewhere: Secondary | ICD-10-CM

## 2016-04-14 LAB — CBC
HEMATOCRIT: 24.8 % — AB (ref 36.0–46.0)
Hemoglobin: 7.9 g/dL — ABNORMAL LOW (ref 12.0–15.0)
MCH: 29.7 pg (ref 26.0–34.0)
MCHC: 31.9 g/dL (ref 30.0–36.0)
MCV: 93.2 fL (ref 78.0–100.0)
PLATELETS: 207 10*3/uL (ref 150–400)
RBC: 2.66 MIL/uL — ABNORMAL LOW (ref 3.87–5.11)
RDW: 15.6 % — AB (ref 11.5–15.5)
WBC: 5.6 10*3/uL (ref 4.0–10.5)

## 2016-04-14 LAB — RENAL FUNCTION PANEL
Albumin: 2.2 g/dL — ABNORMAL LOW (ref 3.5–5.0)
Anion gap: 8 (ref 5–15)
BUN: 21 mg/dL — ABNORMAL HIGH (ref 6–20)
CO2: 28 mmol/L (ref 22–32)
Calcium: 9 mg/dL (ref 8.9–10.3)
Chloride: 97 mmol/L — ABNORMAL LOW (ref 101–111)
Creatinine, Ser: 7.56 mg/dL — ABNORMAL HIGH (ref 0.44–1.00)
GFR calc Af Amer: 7 mL/min — ABNORMAL LOW (ref >60)
GFR calc non Af Amer: 6 mL/min — ABNORMAL LOW (ref >60)
Glucose, Bld: 123 mg/dL — ABNORMAL HIGH (ref 65–99)
Phosphorus: 4.7 mg/dL — ABNORMAL HIGH (ref 2.5–4.6)
Potassium: 4.1 mmol/L (ref 3.5–5.1)
Sodium: 133 mmol/L — ABNORMAL LOW (ref 135–145)

## 2016-04-14 MED ORDER — HYDROMORPHONE HCL 1 MG/ML IJ SOLN
INTRAMUSCULAR | Status: AC
  Administered 2016-04-14: 1 mg via INTRAVENOUS
  Filled 2016-04-14: qty 1

## 2016-04-14 MED ORDER — DARBEPOETIN ALFA 200 MCG/0.4ML IJ SOSY
PREFILLED_SYRINGE | INTRAMUSCULAR | Status: AC
  Administered 2016-04-14: 200 ug via INTRAVENOUS
  Filled 2016-04-14: qty 0.4

## 2016-04-14 NOTE — Progress Notes (Signed)
RN attempted to call report to RN on 6E.

## 2016-04-14 NOTE — Progress Notes (Addendum)
[facility] Progress Note  Subjective    Having mild pain at left antecubital incision. Denies any hand pain.   Objective Vitals:   04/14/16 0305 04/14/16 0310  BP: (!) 145/97 (!) 145/97  Pulse: 69 66  Resp: 14 11  Temp:  97.7 F (36.5 C)    Intake/Output Summary (Last 24 hours) at 04/14/16 0748 Last data filed at 04/13/16 1935  Gross per 24 hour  Intake              860 ml  Output               50 ml  Net              810 ml   Left antecubital incision c/d/i. No hematoma. Palpable thrill left upper arm fistula. 2+ left radial pulse.  Right IJ TDC  Right thigh with old mild serous drainage on dressings. Incisions are intact. No drainage or erythema seen.   Assessment/Planning: 30 y.o. female is s/p: left brachial cephalic AV fistula creation, right IJ TDC insertion POD 1, removal of infected right thigh AVG POD 4  Left upper arm fistula is patent with palpable thrill. No steal symptoms.  Right thigh incisions healing. No active drainage or erythema.  Noted plans from primary team for ECHO given new murmur. Stable from vascular standpoint for d/c once ECHO completed.   Alvia Grove 04/14/2016 7:48 AM --  Laboratory CBC    Component Value Date/Time   WBC 5.6 04/14/2016 0336   HGB 7.9 (L) 04/14/2016 0336   HCT 24.8 (L) 04/14/2016 0336   PLT 207 04/14/2016 0336    BMET    Component Value Date/Time   NA 133 (L) 04/13/2016 1822   K 4.0 04/13/2016 1822   CL 98 (L) 04/13/2016 1822   CO2 28 04/13/2016 1822   GLUCOSE 78 04/13/2016 1822   BUN 15 04/13/2016 1822   CREATININE 5.99 (H) 04/13/2016 1822   CALCIUM 9.0 04/13/2016 1822   GFRNONAA 8 (L) 04/13/2016 1822   GFRAA 10 (L) 04/13/2016 1822    COAG Lab Results  Component Value Date   INR 1.12 04/13/2016   INR 1.05 11/09/2012   INR 1.16 07/15/2010   No results found for: PTT  Antibiotics Anti-infectives    Start     Dose/Rate Route Frequency Ordered Stop   04/14/16 1200  ceFAZolin  (ANCEF) IVPB 2g/100 mL premix     2 g 200 mL/hr over 30 Minutes Intravenous Every M-W-F (Hemodialysis) 04/12/16 1200     04/14/16 0000  ceFAZolin (ANCEF) 2-4 GM/100ML-% IVPB     2 g 200 mL/hr over 30 Minutes Intravenous Every M-W-F (Hemodialysis) 04/13/16 1308 04/26/16 2359   04/13/16 0915  cefUROXime (ZINACEF) 1.5 g in dextrose 5 % 50 mL IVPB     1.5 g 100 mL/hr over 30 Minutes Intravenous On call to O.R. 04/12/16 1150 04/13/16 1318   04/12/16 1200  vancomycin (VANCOCIN) 500 mg in sodium chloride 0.9 % 100 mL IVPB  Status:  Discontinued     500 mg 100 mL/hr over 60 Minutes Intravenous Every M-W-F (Hemodialysis) 04/12/16 1119 04/12/16 1201   04/10/16 2100  vancomycin (VANCOCIN) 500 mg in sodium chloride 0.9 % 100 mL IVPB  Status:  Discontinued     500 mg 100 mL/hr over 60 Minutes Intravenous Every M-W-F (Hemodialysis) 04/10/16 2010 04/11/16 1016   04/10/16 2053  vancomycin (VANCOCIN) 500-5 MG/100ML-% IVPB    Comments:  Molly Maduro   :  cabinet override      04/10/16 2053 04/10/16 2118   04/10/16 2000  piperacillin-tazobactam (ZOSYN) IVPB 3.375 g  Status:  Discontinued     3.375 g 12.5 mL/hr over 240 Minutes Intravenous Every 12 hours 04/10/16 0825 04/12/16 1043   04/10/16 1200  vancomycin (VANCOCIN) 500 mg in sodium chloride 0.9 % 100 mL IVPB  Status:  Discontinued     500 mg 100 mL/hr over 60 Minutes Intravenous Every M-W-F (Hemodialysis) 04/10/16 0825 04/10/16 2010   04/10/16 0815  piperacillin-tazobactam (ZOSYN) IVPB 3.375 g     3.375 g 100 mL/hr over 30 Minutes Intravenous  Once 04/10/16 0807 04/10/16 0928   04/10/16 0815  vancomycin (VANCOCIN) IVPB 1000 mg/200 mL premix     1,000 mg 200 mL/hr over 60 Minutes Intravenous  Once 04/10/16 0807 04/10/16 0915       Virgina Jock, PA-C [facility] Office: [phone] Pager: [phone] 04/14/2016 7:48 AM    I have independently interviewed patient and agree with PA assessment and plan above. F/u in 2  weeks for wound check R thigh and left arm. AVF working well without hand symptoms this a.m..   Frankee Gritz C. Donzetta Matters, MD [facility] [place] Office: [phone] Pager: [phone]

## 2016-04-14 NOTE — Telephone Encounter (Signed)
Sched lab 9/27 at 4:00 and MD 9/29 at 10:45. Pt does not speak english. Lm on emergency contact's # to inform them of appt.

## 2016-04-14 NOTE — Progress Notes (Signed)
OT Cancellation Note  Patient Details Name: Katie Lyons MRN: [id number] DOB: [birth year]   Cancelled Treatment:    Reason Eval/Treat Not Completed: Patient at procedure or test/ unavailable.  Pt going for HD.  Will reattempt eval.   Lucille Passy, OTR/L [phone]   Lucille Passy M 04/14/2016, 1:29 PM

## 2016-04-14 NOTE — Progress Notes (Signed)
Pharmacy Antibiotic Note  Katie Lyons is a 30 y.o. female admitted on 04/10/2016 with sepsis secondary to infected R thigh AV graft. Pharmacy has been consulted for cefazolin dosing. Per MD, plan was to have regimen able to be given with HD to avoid need for home health.   8/28 Blood cultures growing pan-sensitive Strep viridans (sanguinis). Repeat cultures 8/31 neg to date.   HD schedule: MWF - last HD 8/30  Plan: -Continue cefazolin 2g qHD starting 9/1 -F/U repeat cultures  Height: 5\' 1"  (154.9 cm) Weight: 107 lb 12.9 oz (48.9 kg) (bedscale) IBW/kg (Calculated) : 47.8  Temp (24hrs), Avg:97.9 F (36.6 C), Min:97.6 F (36.4 C), Max:98.3 F (36.8 C)   Recent Labs Lab 04/10/16 0800 04/10/16 1057 04/10/16 1901 04/11/16 0041 04/11/16 0725 04/12/16 0359 04/12/16 0830 04/13/16 0407 04/13/16 1822 04/14/16 0336  WBC  --   --   --  12.8* 11.1* 8.8  --  7.2 5.7 5.6  CREATININE  --   --  10.19* 3.68*  --   --  7.14* 5.00* 5.99*  --   LATICACIDVEN 0.76 0.38*  --   --   --   --   --   --   --   --     Estimated Creatinine Clearance: 10.2 mL/min (by C-G formula based on SCr of 5.99 mg/dL).    No Known Allergies  Antimicrobials this admission: Cefazolin 8/30 >> Vanc 8/28 >> 8/30 Zosyn 8/28 >> 8/30  Microbiology results: 8/31 BCx: NGTD 8/28 BCx: Strep viridans (sanguinis) - Pan-sensitive 8/28 UCx: sent  8/28 MRSA PCR: neg  Thank you for allowing pharmacy to be a part of this patient's care.  Stephens November, PharmD Clinical Pharmacist 10:13 AM, 04/14/2016

## 2016-04-14 NOTE — Progress Notes (Signed)
Physical Therapy Treatment Patient Details Name: Katie Lyons MRN: [id number] DOB: 29-Mar-1986 Today's Date: 04/14/2016    History of Present Illness 30 yo admitted to [facility] on 04/10/16 with sepsis due to right thigh HD graft s/p removal (same day) with temporary IJ access.  Pt underwent second procedure on 04/13/16 for R IJ tunneled catheter placement and L arm AV fistula creation.   PMHx: ESRD, HTN.    PT Comments    Pt is progressing well with gait, now not using RW.  HEP program given for LE exercises to help return function to her right leg.  Pt will need to practice stairs and review HEP again prior to d/c.    Follow Up Recommendations  No PT follow up     Equipment Recommendations  None recommended by PT (no longer needs RW)    Recommendations for Other Services   NA     Precautions / Restrictions Precautions Precautions: None Restrictions Weight Bearing Restrictions: No    Mobility  Bed Mobility Overal bed mobility: Modified Independent             General bed mobility comments: HOB elevated, using rail, quickly got EOB without increased time today  Transfers Overall transfer level: Modified independent               General transfer comment: uses rail for transitions  Ambulation/Gait Ambulation/Gait assistance: Supervision Ambulation Distance (Feet): 300 Feet Assistive device:  (IV pole) Gait Pattern/deviations: Step-through pattern;Antalgic Gait velocity: decreased Gait velocity interpretation: Below normal speed for age/gender General Gait Details: mildly antalgic gait pattern, holding IV pole due to functional reasons, not really for support.  Pt no longer feels she needs RW for d/c.  Supervision for safety due to gait deviations          Balance Overall balance assessment: Needs assistance Sitting-balance support: Feet supported;No upper extremity supported Sitting balance-Leahy Scale: Good     Standing balance support: No upper  extremity supported Standing balance-Leahy Scale: Good                      Cognition Arousal/Alertness: Awake/alert Behavior During Therapy: WFL for tasks assessed/performed Overall Cognitive Status: Within Functional Limits for tasks assessed                      Exercises General Exercises - Lower Extremity Ankle Circles/Pumps: AROM;Both;20 reps Quad Sets: AROM;Both;10 reps Heel Slides: AROM;Both;10 reps Hip ABduction/ADduction: AROM;Both;10 reps Straight Leg Raises: AROM;Both;10 reps    General Comments General comments (skin integrity, edema, etc.): On site interpreter used, [name].       Pertinent Vitals/Pain Pain Assessment: 0-10 Pain Score: 7  Pain Location: left arm, right leg Pain Descriptors / Indicators: Aching;Burning Pain Intervention(s): Limited activity within patient's tolerance;Monitored during session;Repositioned           PT Goals (current goals can now be found in the care plan section) Acute Rehab PT Goals Patient Stated Goal: to go home.  Progress towards PT goals: Progressing toward goals    Frequency  Min 3X/week    PT Plan Equipment recommendations need to be updated       End of Session   Activity Tolerance: Patient limited by pain Patient left: in bed;with call bell/phone within reach     Time: 1304-1330 PT Time Calculation (min) (ACUTE ONLY): 26 min  Charges:  $Gait Training: 8-22 mins $Therapeutic Exercise: 8-22 mins  Barbarann Ehlers [place], [place], DPT [phone]   04/14/2016, 3:31 PM

## 2016-04-14 NOTE — Progress Notes (Addendum)
Subjective:  The patient was seen and evaluated at bedside. Today the patient has complaints only of pain over her new fistula site in her right arm. This was placed yesterday and the pain is relieved by Percocet. She denies any other symptoms. She reports he left leg pain is improving. She is to receive dialysis today along with her usually scheduled EPO injection. Plans for Echo today for evaluation of endocarditis secondary to patients bacteremia. She denies any fevers, chills, headache, fatigue or abdominal pain.   Objective:  Vital signs in last 24 hours: Vitals:   04/14/16 0305 04/14/16 0310 04/14/16 1100 04/14/16 1345  BP: (!) 145/97 (!) 145/97 117/80 (!) 141/93  Pulse: 69 66 77 66  Resp: 14 11 20 13   Temp:  97.7 F (36.5 C) 97.9 F (36.6 C) 98 F (36.7 C)  TempSrc:  Oral Oral Oral  SpO2: 97% 100% 100% 100%  Weight:    114 lb 13.8 oz (52.1 kg)  Height:       Physical Exam  Constitutional: She is well-developed, well-nourished, and in no distress.  HENT:  Head: Normocephalic and atraumatic.  Cardiovascular: Normal rate and regular rhythm.   2/6 systolic murmur heard best over LSB   Pulmonary/Chest: Effort normal and breath sounds normal. No respiratory distress. She has no wheezes.  Abdominal: Soft. Bowel sounds are normal. She exhibits no distension. There is no tenderness.  Musculoskeletal:  There is some tenderness of the new AVF on the upper left arm. No erythema, drainage or other concerning symptoms. Rates pain 4/10. Prior fistula site on right thigh covered with clean and dry dressings.   Skin: Skin is warm and dry. She is not diaphoretic.   Microbiology: Blood culture 8/28: Streptococcus sanguinis, pan-sensitive Blood cultures 8/31: No bacterial growth at 1 day  Medications:  . amLODipine  10 mg Oral Daily  . calcitRIOL  0.75 mcg Oral Q M,W,F-HD  . calcium acetate  1,334 mg Oral TID WC  . carvedilol  25 mg Oral BID WC  .  ceFAZolin (ANCEF) IV  2 g  Intravenous Q M,W,F-HD  . darbepoetin (ARANESP) injection - DIALYSIS  200 mcg Intravenous Q Fri-HD  . docusate sodium  100 mg Oral Daily  . feeding supplement (NEPRO CARB STEADY)  237 mL Oral BID BM  . heparin subcutaneous  5,000 Units Subcutaneous Q8H  . lisinopril  40 mg Oral QHS  . multivitamin  1 tablet Oral QHS  . pantoprazole  40 mg Oral Daily  . sevelamer carbonate  2,400 mg Oral BID WC    Assessment/Plan: This is a 30 year old female with MHx significant for ESRD of unknown origin on HD, who was admitted 8/28 with symptoms of right thigh pain overlying her fistula and fever. Patient determined to have a HD graft infection. Graft was surgically removed 0000000 without complication. New site placed in left proximal arm.   Principal Problem:   Infection of AV graft for dialysis [facility]) Active Problems:   Acute blood loss anemia   Sepsis ([place])   ESRD on dialysis ([place])  Infection of AV Graft for Dialysis WBC elevation present on admission resolved at this time. Blood cultures grew pan sensitive strep sanguinis. She is on Cefazolin 2g Q M, W, F w/HD and tolerating medication well. Repeat blood cultures from 8/31 show no growth at 1 day.  As patient had strep bacteremia as well as a new LLSB murmur, TTE ordered for evaluation of possible endocarditis. At this point doubt endocarditis  based on Modified Duke Criteria as patient has only 1 minor criteria in addition to her initial positive blood culture. Patient had removal of infected graft with placement of LUE AVF yesterday and tolerated procedure well.  Awaiting results of TTE.  Anemia: Hb today 7.9, down from 8.3 yesterday. She denies any sources of bleeding. Reports her AVF site and healing prior fistula site have been dry. Anemia likely secondary to CKD and recent surgery. Scheduled for EPO dose today.  ESRD: Last dialysis session 8/30. Patient scheduled for dialysis today along with her EPO dose. She is being followed by nephrology and  appreciate their advice on management.   HTN: Appears controlled during admission. Home medications (Lisinopril 40 mg Qdaily, Carvedilol 25 mg BID and Amlodipine 10 mg Qdaily) continued.   Length of Stay: 4 days Dispo: Anticipated discharge in approximately 1-2 day(s) pending ECHO results.  Myah Guynes, DO 04/14/2016, 2:06 PM Pager: [phone]

## 2016-04-14 NOTE — Progress Notes (Signed)
Dialysis Orders: [facility] MWF 3:45 160F 400/A 1.5x EDW 46.5kg RLE AVG 2000 Heparin (will hold post procedure) Aranesp 200 mcg IV q week (last dose 8/25)  Calcitriol 0.39mcg PO q treatment  OP Labs: Hgb 7.3 Tsat 34% K 5.2 Ca 9.6 P 5.6 iPTH 485   Assessment/ Plan:   1. AV Graft infection, right thigh- s/p removal today by Dr. Scot Dock.  On IV Vanc/Zosyn 2. ESRD continue with HD qMWF, for HD today 3. Anemia:on Aranesp 242mcg iv.  ABLA on anemia of chronic disease.  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 Nutrition: renal diet 6. Hypertension: stable 7. Vascular access- has new LUE brachiocephalic avf and right IJ tdc  Subjective: Interval History: Post op AVF and TDC  Objective: Vital signs in last 24 hours: Temp:  [97.6 F (36.4 C)-98.3 F (36.8 C)] 97.7 F (36.5 C) (09/01 0310) Pulse Rate:  [59-90] 66 (09/01 0310) Resp:  [9-26] 11 (09/01 0310) BP: (115-149)/(78-99) 145/97 (09/01 0310) SpO2:  [97 %-100 %] 100 % (09/01 0310) Weight change:   Intake/Output from previous day: 08/31 0701 - 09/01 0700 In: 860 [P.O.:240; I.V.:620] Out: 50 [Blood:50] Intake/Output this shift: No intake/output data recorded.  General appearance: alert and cooperative Resp: clear to auscultation bilaterally Chest wall: no tenderness, right chest TDC Extremities: LUE BC AVF, tr edema, groin not examined  Lab Results:  Recent Labs  04/13/16 1822 04/14/16 0336  WBC 5.7 5.6  HGB 8.3* 7.9*  HCT 26.3* 24.8*  PLT 212 207   BMET:  Recent Labs  04/13/16 0407 04/13/16 1822  NA 136 133*  K 3.9 4.0  CL 98* 98*  CO2 31 28  GLUCOSE 88 78  BUN 13 15  CREATININE 5.00* 5.99*  CALCIUM 9.0 9.0   No results for input(s): PTH in the last 72 hours. Iron Studies: No results for input(s): IRON, TIBC, TRANSFERRIN, FERRITIN in the last 72 hours. Studies/Results: Dg Chest Port 1 View  Result Date: 04/13/2016 CLINICAL DATA:  Dialysis catheter insertion. History of end-stage renal disease,  hypertension, traumatic brain injury. EXAM: PORTABLE CHEST 1 VIEW COMPARISON:  Chest radiograph April 10, 2016 FINDINGS: Cardiac silhouette is mildly enlarged, unchanged. Mediastinal silhouette is nonsuspicious. Mild pulmonary vascular congestion. New tunneled dialysis catheter via RIGHT internal jugular venous approach with distal tip projecting cavoatrial junction. No pneumothorax. Minimal subcutaneous gas RIGHT supraclavicular soft tissues compatible with recent instrumentation. Osseous structures are nonsuspicious. IMPRESSION: New tunneled dialysis catheter with distal tip projecting cavoatrial junction, no pneumothorax. Mild cardiomegaly and pulmonary vascular congestion. Electronically Signed   By: Elon Alas M.D.   On: 04/13/2016 16:12    Scheduled: . amLODipine  10 mg Oral Daily  . calcitRIOL  0.75 mcg Oral Q M,W,F-HD  . calcium acetate  1,334 mg Oral TID WC  . carvedilol  25 mg Oral BID WC  .  ceFAZolin (ANCEF) IV  2 g Intravenous Q M,W,F-HD  . darbepoetin (ARANESP) injection - DIALYSIS  200 mcg Intravenous Q Fri-HD  . docusate sodium  100 mg Oral Daily  . feeding supplement (NEPRO CARB STEADY)  237 mL Oral BID BM  . heparin subcutaneous  5,000 Units Subcutaneous Q8H  . lisinopril  40 mg Oral QHS  . multivitamin  1 tablet Oral QHS  . pantoprazole  40 mg Oral Daily  . sevelamer carbonate  2,400 mg Oral BID WC    LOS: 4 days   Dora Clauss C 04/14/2016,10:23 AM

## 2016-04-14 NOTE — Progress Notes (Signed)
Interpreter Lesle Chris for [place] for PT and  Abigail Butts OT

## 2016-04-14 NOTE — Telephone Encounter (Signed)
-----   Message from Mena Goes, RN sent at 04/14/2016  9:51 AM EDT ----- Regarding: schedule   ----- Message ----- From: Alvia Grove, PA-C Sent: 04/13/2016   2:42 PM To: Vvs Charge Pool  S/p left brachial cephalic AVF and insertion of [facility] 04/13/16  F/u with Dr. Donzetta Matters in 4 weeks with duplex.   Thanks Maudie Mercury

## 2016-04-15 ENCOUNTER — Inpatient Hospital Stay (HOSPITAL_COMMUNITY): Payer: Medicaid Other

## 2016-04-15 DIAGNOSIS — T827XXA Infection and inflammatory reaction due to other cardiac and vascular devices, implants and grafts, initial encounter: Principal | ICD-10-CM

## 2016-04-15 DIAGNOSIS — B955 Unspecified streptococcus as the cause of diseases classified elsewhere: Secondary | ICD-10-CM

## 2016-04-15 DIAGNOSIS — I339 Acute and subacute endocarditis, unspecified: Secondary | ICD-10-CM

## 2016-04-15 LAB — ANAEROBIC CULTURE

## 2016-04-15 LAB — CBC
HCT: 25.3 % — ABNORMAL LOW (ref 36.0–46.0)
HEMOGLOBIN: 8 g/dL — AB (ref 12.0–15.0)
MCH: 29.5 pg (ref 26.0–34.0)
MCHC: 31.6 g/dL (ref 30.0–36.0)
MCV: 93.4 fL (ref 78.0–100.0)
Platelets: 222 10*3/uL (ref 150–400)
RBC: 2.71 MIL/uL — AB (ref 3.87–5.11)
RDW: 15.2 % (ref 11.5–15.5)
WBC: 6.2 10*3/uL (ref 4.0–10.5)

## 2016-04-15 LAB — ECHOCARDIOGRAM COMPLETE
HEIGHTINCHES: 61 in
Weight: 1788.37 oz

## 2016-04-15 LAB — RENAL FUNCTION PANEL
ANION GAP: 8 (ref 5–15)
Albumin: 2.3 g/dL — ABNORMAL LOW (ref 3.5–5.0)
BUN: 6 mg/dL (ref 6–20)
CALCIUM: 8.8 mg/dL — AB (ref 8.9–10.3)
CHLORIDE: 97 mmol/L — AB (ref 101–111)
CO2: 27 mmol/L (ref 22–32)
Creatinine, Ser: 3.8 mg/dL — ABNORMAL HIGH (ref 0.44–1.00)
GFR calc non Af Amer: 15 mL/min — ABNORMAL LOW (ref >60)
GFR, EST AFRICAN AMERICAN: 17 mL/min — AB (ref >60)
GLUCOSE: 109 mg/dL — AB (ref 65–99)
Phosphorus: 3.3 mg/dL (ref 2.5–4.6)
Potassium: 3.6 mmol/L (ref 3.5–5.1)
SODIUM: 132 mmol/L — AB (ref 135–145)

## 2016-04-15 NOTE — Progress Notes (Signed)
[facility] Progress Note  Assessment/Plan: 1. Strep sanguinis bacteremia 8/28 w/AV Graft infection, right thigh- s/p removal 8/31 by Dr. Scot Dock. On Ancef. Echo ordered for Friday per primary note, but I don't see any results 2. ESRD MWF - next HD Monday if still here - titirate to EDW 3. Anemia:on Aranesp 227mcg iv. ABLA on anemia of chronic disease. hgb stable at 8 4. CKD-MBD: calcitriol/phoslo/renvela P 3.3 5. Nutrition: renal diet alb 2.3/supplements ordered - not eating eggs at breakfast 6. Hypertension: net UF Friday 2995 with post weight 49.1 if accurate still above EDW- sats ok - BP stable ; on norvasc, coreg and lisinopril - BP actually better here than in outpt setting 7. Vascular access- has new LUE brachiocephalic avf and right IJ tdc 8/31 stable  Myriam Jacobson, PA-C [facility] [phone] 04/15/2016,8:12 AM  LOS: 5 days   Renal Attending;  Issue of TTE +- TEE remains.  She looks well.  We will defer endocarditis eval to IM. Kwan Shellhammer C   Renal Attending: Subjective:  Wants to go home; no pain in right thigh  Objective Vitals:   04/14/16 2012 04/14/16 2100 04/14/16 2230 04/15/16 0527  BP: (!) 143/88 127/86 137/86 140/86  Pulse: 74  68 71  Resp: 14  13 18   Temp: 98.6 F (37 C)  98 F (36.7 C) 98.5 F (36.9 C)  TempSrc: Oral  Oral Oral  SpO2: 100%  100% 98%  Weight:   50.7 kg (111 lb 12.4 oz)   Height:       Physical Exam General: NAD eating breakfast Heart: RRR 2/6 murmur Lungs: no rales  Abdomen: soft NT Extremities: no edema right thigh dressing intact Dialysis Access:  Left upper AVF maturing and right IJ cath  Dialysis Orders:  [facility] MWF 3:45 160F 400/A 1.5x EDW 46.5kg RLE AVG 2000 Heparin (will hold post procedure) Aranesp 200 mcg IV q week (last dose 8/25)  Calcitriol 0.65mcg PO q treatment    OP Labs: Hgb 7.3 Tsat 34% K 5.2 Ca 9.6 P 5.6 iPTH 485   Additional Objective Labs: Basic Metabolic  Panel:  Recent Labs Lab 04/13/16 1822 04/14/16 1350 04/15/16 0307  NA 133* 133* 132*  K 4.0 4.1 3.6  CL 98* 97* 97*  CO2 28 28 27   GLUCOSE 78 123* 109*  BUN 15 21* 6  CREATININE 5.99* 7.56* 3.80*  CALCIUM 9.0 9.0 8.8*  PHOS 4.8* 4.7* 3.3   Liver Function Tests:  Recent Labs Lab 04/10/16 0744  04/13/16 1822 04/14/16 1350 04/15/16 0307  AST 30  --   --   --   --   ALT 36  --   --   --   --   ALKPHOS 92  --   --   --   --   BILITOT 0.5  --   --   --   --   PROT 6.7  --   --   --   --   ALBUMIN 3.6  < > 2.3* 2.2* 2.3*  < > = values in this interval not displayed. No results for input(s): LIPASE, AMYLASE in the last 168 hours. CBC:  Recent Labs Lab 04/10/16 0744  04/12/16 0359 04/13/16 0407 04/13/16 1822 04/14/16 0336 04/15/16 0307  WBC 19.7*  < > 8.8 7.2 5.7 5.6 6.2  NEUTROABS 17.9*  --   --   --   --   --   --   HGB 8.3*  < > 6.6* 8.0*  8.3* 7.9* 8.0*  HCT 26.5*  < > 20.2* 24.7* 26.3* 24.8* 25.3*  MCV 97.8  < > 91.8 91.5 93.6 93.2 93.4  PLT 213  < > 158 184 212 207 222  < > = values in this interval not displayed. Blood Culture    Component Value Date/Time   SDES BLOOD RIGHT HAND 04/13/2016 0715   SPECREQUEST IN PEDIATRIC BOTTLE 3CC 04/13/2016 0715   CULT NO GROWTH 1 DAY 04/13/2016 0715   REPTSTATUS PENDING 04/13/2016 0715    Cardiac Enzymes: No results for input(s): CKTOTAL, CKMB, CKMBINDEX, TROPONINI in the last 168 hours. CBG: No results for input(s): GLUCAP in the last 168 hours. Iron Studies: No results for input(s): IRON, TIBC, TRANSFERRIN, FERRITIN in the last 72 hours. Lab Results  Component Value Date   INR 1.12 04/13/2016   INR 1.05 11/09/2012   INR 1.16 07/15/2010   Studies/Results: Dg Chest Port 1 View  Result Date: 04/13/2016 CLINICAL DATA:  Dialysis catheter insertion. History of end-stage renal disease, hypertension, traumatic brain injury. EXAM: PORTABLE CHEST 1 VIEW COMPARISON:  Chest radiograph April 10, 2016 FINDINGS: Cardiac  silhouette is mildly enlarged, unchanged. Mediastinal silhouette is nonsuspicious. Mild pulmonary vascular congestion. New tunneled dialysis catheter via RIGHT internal jugular venous approach with distal tip projecting cavoatrial junction. No pneumothorax. Minimal subcutaneous gas RIGHT supraclavicular soft tissues compatible with recent instrumentation. Osseous structures are nonsuspicious. IMPRESSION: New tunneled dialysis catheter with distal tip projecting cavoatrial junction, no pneumothorax. Mild cardiomegaly and pulmonary vascular congestion. Electronically Signed   By: Elon Alas M.D.   On: 04/13/2016 16:12   Medications: . sodium chloride 10 mL/hr at 04/14/16 1900   . amLODipine  10 mg Oral Daily  . calcitRIOL  0.75 mcg Oral Q M,W,F-HD  . calcium acetate  1,334 mg Oral TID WC  . carvedilol  25 mg Oral BID WC  .  ceFAZolin (ANCEF) IV  2 g Intravenous Q M,W,F-HD  . darbepoetin (ARANESP) injection - DIALYSIS  200 mcg Intravenous Q Fri-HD  . docusate sodium  100 mg Oral Daily  . feeding supplement (NEPRO CARB STEADY)  237 mL Oral BID BM  . heparin subcutaneous  5,000 Units Subcutaneous Q8H  . lisinopril  40 mg Oral QHS  . multivitamin  1 tablet Oral QHS  . pantoprazole  40 mg Oral Daily  . sevelamer carbonate  2,400 mg Oral BID WC

## 2016-04-15 NOTE — Progress Notes (Signed)
  Echocardiogram 2D Echocardiogram has been performed.  Katie Lyons 04/15/2016, 1:10 PM

## 2016-04-15 NOTE — Progress Notes (Signed)
Physical Therapy Treatment Patient Details Name: Katie Lyons MRN: [id number] DOB: [birth year] Today's Date: 04/15/2016    History of Present Illness 30 yo admitted to [facility] on 04/10/16 with sepsis due to right thigh HD graft s/p removal (same day) with temporary IJ access.  Pt underwent second procedure on 04/13/16 for R IJ tunneled catheter placement and L arm AV fistula creation.   PMHx: ESRD, HTN.    PT Comments    Patient has achieved all PT goals.  Able to negotiate 7 stairs with supervision only.  Able to perform HEP independently.  No further acute PT needs - PT will sign off.  Encouraged patient to continue ambulating in room and hallway.   Follow Up Recommendations  No PT follow up     Equipment Recommendations  None recommended by PT    Recommendations for Other Services       Precautions / Restrictions Precautions Precautions: None Restrictions Weight Bearing Restrictions: No    Mobility  Bed Mobility               General bed mobility comments: Patient in chair  Transfers Overall transfer level: Independent Equipment used: None                Ambulation/Gait Ambulation/Gait assistance: Modified independent (Device/Increase time) Ambulation Distance (Feet): 300 Feet Assistive device: None Gait Pattern/deviations: Step-through pattern;Decreased stride length Gait velocity: slightly decreased Gait velocity interpretation: Below normal speed for age/gender General Gait Details: Patient with good gait pattern and balance.  Slightly decreased velocity.  No loss of balance with gait with no AD.   Stairs Stairs: Yes Stairs assistance: Supervision Stair Management: One rail Right;Step to pattern;Forwards Number of Stairs: 7 General stair comments: Patient instructed on step-to pattern with 1 rail for safety.  Able to complete with supervision only.  Wheelchair Mobility    Modified Rankin (Stroke Patients Only)       Balance    Sitting-balance support: No upper extremity supported Sitting balance-Leahy Scale: Normal     Standing balance support: No upper extremity supported Standing balance-Leahy Scale: Good                      Cognition Arousal/Alertness: Awake/alert Behavior During Therapy: WFL for tasks assessed/performed Overall Cognitive Status: Within Functional Limits for tasks assessed                      Exercises General Exercises - Lower Extremity Ankle Circles/Pumps: AROM;Both;10 reps;Seated Quad Sets: AROM;Both;10 reps Heel Slides: AROM;Both;10 reps Hip ABduction/ADduction: AROM;Both;10 reps Straight Leg Raises: AROM;Both;10 reps    General Comments        Pertinent Vitals/Pain Pain Assessment: Faces Faces Pain Scale: Hurts a little bit Pain Location: RLE Pain Descriptors / Indicators: Sore Pain Intervention(s): Monitored during session    Home Living                      Prior Function            PT Goals (current goals can now be found in the care plan section) Acute Rehab PT Goals Patient Stated Goal: to go home.  Progress towards PT goals: Goals met/education completed, patient discharged from PT    Frequency  Min 3X/week    PT Plan Current plan remains appropriate    Co-evaluation             End of Session Equipment Utilized During Treatment: Gait belt (for stairs)  Activity Tolerance: Patient tolerated treatment well Patient left: in chair;with call bell/phone within reach (MD and interpreter in room)     Time: 5521-7471 PT Time Calculation (min) (ACUTE ONLY): 17 min  Charges:  $Gait Training: 8-22 mins                    G Codes:      Despina Pole 2016/04/25, 11:23 AM Katie Lyons. Katie Lyons, Katie Lyons Pager [phone]

## 2016-04-15 NOTE — Discharge Instructions (Signed)
You will receive antibiotics during dialysis sessions (Monday, Wednesday and Friday) over the next 2 weeks.  You will need an examination done as an outpatient. Its called an TEE and will take a closer look at your heart. Please take all medications as directed. Please complete dialysis sessions as instructed.

## 2016-04-15 NOTE — Progress Notes (Signed)
Internal Medicine Attending  Date: 04/15/2016  Patient name: Katie Lyons Medical record number: [id number] Date of birth: 11-30-1985 Age: 30 y.o. Gender: female  I saw and evaluated the patient. I reviewed the resident's note by Dr. Danford Bad and I agree with the resident's findings and plans as documented in her progress note.  TTE just completed and the read is pending.  Once resulted we can make further plans on disposition.

## 2016-04-15 NOTE — Progress Notes (Signed)
Patient discharge teaching given, including activity, diet, follow-up appoints, and medications. Patient verbalized understanding of all discharge instructions.  All information at discharge discussed using [employer].  IV access was d/c'd. Vitals are stable. Skin is intact except as charted in most recent assessments. Pt to be escorted out by NT, to be driven home by family.  Jillyn Ledger, MBA, BSN, RN

## 2016-04-15 NOTE — Progress Notes (Signed)
  Progress Note    04/15/2016 10:49 AM 2 Days Post-Op  Subjective: arm pain improved this a.m.  Vitals:   04/15/16 0527 04/15/16 0855  BP: 140/86 (!) 152/96  Pulse: 71 76  Resp: 18 17  Temp: 98.5 F (36.9 C) 97.5 F (36.4 C)    Physical Exam: Left arm strong thrill in upper arm Palpable left radial pulse R thigh wounds cdi with sutures  CBC    Component Value Date/Time   WBC 6.2 04/15/2016 0307   RBC 2.71 (L) 04/15/2016 0307   HGB 8.0 (L) 04/15/2016 0307   HCT 25.3 (L) 04/15/2016 0307   PLT 222 04/15/2016 0307   MCV 93.4 04/15/2016 0307   MCH 29.5 04/15/2016 0307   MCHC 31.6 04/15/2016 0307   RDW 15.2 04/15/2016 0307   LYMPHSABS 0.9 04/10/2016 0744   MONOABS 0.9 04/10/2016 0744   EOSABS 0.0 04/10/2016 0744   BASOSABS 0.0 04/10/2016 0744    BMET    Component Value Date/Time   NA 132 (L) 04/15/2016 0307   K 3.6 04/15/2016 0307   CL 97 (L) 04/15/2016 0307   CO2 27 04/15/2016 0307   GLUCOSE 109 (H) 04/15/2016 0307   BUN 6 04/15/2016 0307   CREATININE 3.80 (H) 04/15/2016 0307   CALCIUM 8.8 (L) 04/15/2016 0307   GFRNONAA 15 (L) 04/15/2016 0307   GFRAA 17 (L) 04/15/2016 0307    INR    Component Value Date/Time   INR 1.12 04/13/2016 0407     Intake/Output Summary (Last 24 hours) at 04/15/16 1049 Last data filed at 04/15/16 1042  Gross per 24 hour  Intake          1135.33 ml  Output             2995 ml  Net         -1859.67 ml     Assessment:  30 y.o. female is s/p Right thigh graft excision placement of left upper extremity AV fistula and tunneled dialysis catheter.  Plan: -ok for d/c from vascular standpoint -will f/u in 2 weeks for wound check   Annalis Kaczmarczyk C. Donzetta Matters, MD [facility] Office: [phone] Pager: [phone]  04/15/2016 10:49 AM

## 2016-04-15 NOTE — Progress Notes (Signed)
Subjective:  Patient reports she is feeling well, denies any complaints at this time. Had hemodialysis yesterday and is feeling fine. Denies any fevers, chills, chest pain or any other symptoms. Patient to have TTE today and possible discharge depending on results.   Objective:  Vital signs in last 24 hours: Vitals:   04/14/16 2100 04/14/16 2230 04/15/16 0527 04/15/16 0855  BP: 127/86 137/86 140/86 (!) 152/96  Pulse:  68 71 76  Resp:  13 18 17   Temp:  98 F (36.7 C) 98.5 F (36.9 C) 97.5 F (36.4 C)  TempSrc:  Oral Oral Oral  SpO2:  100% 98% 100%  Weight:  111 lb 12.4 oz (50.7 kg)    Height:       Scheduled medications: . amLODipine  10 mg Oral Daily  . calcitRIOL  0.75 mcg Oral Q M,W,F-HD  . calcium acetate  1,334 mg Oral TID WC  . carvedilol  25 mg Oral BID WC  .  ceFAZolin (ANCEF) IV  2 g Intravenous Q M,W,F-HD  . darbepoetin (ARANESP) injection - DIALYSIS  200 mcg Intravenous Q Fri-HD  . docusate sodium  100 mg Oral Daily  . feeding supplement (NEPRO CARB STEADY)  237 mL Oral BID BM  . heparin subcutaneous  5,000 Units Subcutaneous Q8H  . lisinopril  40 mg Oral QHS  . multivitamin  1 tablet Oral QHS  . pantoprazole  40 mg Oral Daily  . sevelamer carbonate  2,400 mg Oral BID WC   Physical Exam  Constitutional: She is well-developed, well-nourished, and in no distress.  HENT:  Head: Normocephalic and atraumatic.  Cardiovascular: Normal rate and regular rhythm.   Murmur heard. 3/6 systolic blowing murmur heard best at the LLSB. Unchanged from yesterdays examination.   Pulmonary/Chest: Effort normal and breath sounds normal. No respiratory distress. She has no wheezes.  Abdominal: Soft. Bowel sounds are normal. She exhibits no distension.  Skin: Skin is warm and dry. She is not diaphoretic.  Newly placed fistula site without erythema or tenderness.     Assessment/Plan:  Principal Problem:   Infection of AV graft for dialysis [facility]) Active Problems:   Acute  blood loss anemia   Sepsis ([place])   ESRD on dialysis ([place])  Infected AV Graft for Dialysis Initial blood cultures grew pan-sensitive strep sanguinis. Repeat blood culture negative for any bacterial growth. Her infected catheter was removed with replacement of a new AVF and temporary dialysis site as well. She is being treated with Cefazolin 2g Q M, W, F w/HD and is tolerating her meds well. WBC elevation which was present on admission has resolved.  As patient had a new LLSB murmur along with GP bacteremia, TTE has been ordered for evaluation of endocarditis. Doubt endocarditis based on Modified [facility] Criteria.  -Awaiting results of TTE -If TTE negative for vegetations, she will likely need an outpatient TEE for further evaluation and she will be d/c on 2 week course of IV abx.  -IF TTE positive for abnormality or vegetations, she will need TEE while inpatient and will need 4-6 weeks of IV abx.   Anemia Likely secondary to patients ESRD. She received her usual EPO injection yesterday during dialysis. Hb at 8.0, up from 7.9 yesterday.   ESRD on HD Last dialysis. Next scheduled dialysis M. Reports she tolerated dialysis well. ESRD of unknown origin. She is being followed by nephrology and we appreciate their advise.   HTN: Continues to appear controlled during this admission. Home medications (Lisinopril 40 mg,  Carvedilol 25 mg BID and Amlodipine 10 mg) continued.    Length of Stay: 5 days Dispo: Anticipated discharge in approximately 0-1 day(s).   Ernesto Lashway, DO 04/15/2016, 10:21 AM Pager: [phone]

## 2016-04-18 LAB — CULTURE, BLOOD (ROUTINE X 2)
CULTURE: NO GROWTH
CULTURE: NO GROWTH

## 2016-05-05 ENCOUNTER — Encounter: Payer: Self-pay | Admitting: Vascular Surgery

## 2016-05-08 ENCOUNTER — Other Ambulatory Visit: Payer: Self-pay | Admitting: *Deleted

## 2016-05-08 DIAGNOSIS — N186 End stage renal disease: Secondary | ICD-10-CM

## 2016-05-08 DIAGNOSIS — Z4931 Encounter for adequacy testing for hemodialysis: Secondary | ICD-10-CM

## 2016-05-09 ENCOUNTER — Ambulatory Visit (HOSPITAL_COMMUNITY)
Admission: RE | Admit: 2016-05-09 | Discharge: 2016-05-09 | Disposition: A | Discharge status: Home / Self Care | Payer: Self-pay | Source: Ambulatory Visit | Attending: Vascular Surgery | Admitting: Vascular Surgery

## 2016-05-09 DIAGNOSIS — Z4931 Encounter for adequacy testing for hemodialysis: Secondary | ICD-10-CM | POA: Insufficient documentation

## 2016-05-09 DIAGNOSIS — Z9889 Other specified postprocedural states: Secondary | ICD-10-CM | POA: Insufficient documentation

## 2016-05-09 DIAGNOSIS — N186 End stage renal disease: Secondary | ICD-10-CM | POA: Insufficient documentation

## 2016-05-10 ENCOUNTER — Ambulatory Visit (HOSPITAL_COMMUNITY): Payer: MEDICAID

## 2016-05-12 ENCOUNTER — Ambulatory Visit (INDEPENDENT_AMBULATORY_CARE_PROVIDER_SITE_OTHER): Payer: Self-pay | Admitting: Vascular Surgery

## 2016-05-12 ENCOUNTER — Encounter: Payer: Self-pay | Admitting: Vascular Surgery

## 2016-05-12 VITALS — BP 152/96 | HR 67 | Temp 97.0°F | Resp 14 | Ht 60.0 in | Wt 109.0 lb

## 2016-05-12 DIAGNOSIS — N186 End stage renal disease: Secondary | ICD-10-CM

## 2016-05-12 DIAGNOSIS — Z992 Dependence on renal dialysis: Secondary | ICD-10-CM

## 2016-05-12 NOTE — Progress Notes (Signed)
Subjective:     Patient ID: Katie Lyons, female   DOB: 18-Mar-1986, 30 y.o.   MRN: [id number]  HPI patient returns today for follow-up of recent hospitalization with right femoral graft excision patch angioplasty of her femoral artery and placement of right IJ tunneled dialysis catheter and left brachiocephalic AV fistula on the left. She is feeling very well today she is without complaint her left hand is good her right foot is good and she is healing her incisions in her right groin without issue. She is dialyzing Monday Wednesday Friday via her right IJ tunneled dialysis catheter which is working well.   Review of Systems  Constitutional: Negative.   Gastrointestinal: Negative.   Musculoskeletal: Negative.   Allergic/Immunologic: Negative.        Objective:   Physical Exam  Constitutional: She appears well-developed.  Cardiovascular: Normal rate.   Pulses:      Radial pulses are 2+ on the left side.  Abdominal: Soft.  Musculoskeletal:  R groin incisions cdi with sutures  left arm avf with thrill, incision healing       Assessment:    30 year old female status post excision of right femoral AV graft placement of right IJ TDC and left brachiocephalic AV fistula. She is doing well from all these standpoints.    Plan:     Follow-up in 2 months with left upper extremity AV access duplex. She'll follow sooner if she has any issues from her incisions or from her tunneled dialysis catheter standpoint. We will otherwise plan to see her with a duplex in 2 months.  Raley Novicki C. Donzetta Matters, MD [facility] [employer] Office: [phone] Pager: [phone]

## 2016-05-16 NOTE — Addendum Note (Signed)
Addended by: Kaleen Mask on: 05/16/2016 02:06 PM   Modules accepted: Orders

## 2016-05-31 ENCOUNTER — Ambulatory Visit: Payer: Self-pay | Admitting: Family Medicine

## 2016-07-12 ENCOUNTER — Encounter: Payer: Self-pay | Admitting: Vascular Surgery

## 2016-07-14 ENCOUNTER — Encounter (HOSPITAL_COMMUNITY): Payer: Self-pay

## 2016-07-14 ENCOUNTER — Ambulatory Visit: Payer: Self-pay | Admitting: Vascular Surgery

## 2017-04-10 ENCOUNTER — Ambulatory Visit (HOSPITAL_COMMUNITY)
Admission: RE | Admit: 2017-04-10 | Discharge: 2017-04-10 | Disposition: A | Discharge status: Home / Self Care | Payer: Self-pay | Source: Ambulatory Visit | Attending: Oncology | Admitting: Oncology

## 2017-04-10 DIAGNOSIS — Z992 Dependence on renal dialysis: Secondary | ICD-10-CM | POA: Insufficient documentation

## 2017-04-10 DIAGNOSIS — N186 End stage renal disease: Secondary | ICD-10-CM | POA: Insufficient documentation

## 2017-04-10 LAB — PREPARE RBC (CROSSMATCH)

## 2017-04-10 MED ORDER — SODIUM CHLORIDE 0.9 % IV SOLN
Freq: Once | INTRAVENOUS | Status: AC
  Administered 2017-04-10: 11:00:00 via INTRAVENOUS

## 2017-04-10 NOTE — Discharge Instructions (Signed)
Transfusión de sangre, cuidados posteriores °(Blood Transfusion, Care After) °Estas indicaciones le proporcionan información acerca de cómo deberá cuidarse después del procedimiento. El médico también podrá darle instrucciones específicas. Comuníquese con el médico si tiene algún problema o tiene preguntas después del procedimiento. °CUIDADOS EN EL HOGAR °· Tome los medicamentos solamente como se lo haya indicado el médico. Pregúntele al médico si puede tomar un analgésico de venta libre si tiene fiebre o dolor de cabeza uno o dos días después del procedimiento. °· Retome sus actividades habituales como se lo haya indicado el médico. ° °SOLICITE AYUDA SI: °· Presenta enrojecimiento o irritación en el lugar donde se colocó la vía intravenosa. °· Tiene fiebre, escalofríos o dolor de cabeza que no se alivia. °· La orina es más oscura que lo normal. °· La orina se vuelve de color: °? Rosa. °? Rojo. °? Marrón. °· La parte blanca de los ojos se vuelve amarilla (ictericia). °· Se siente débil después de realizar sus actividades habituales. ° °SOLICITE AYUDA DE INMEDIATO SI: °· Tiene dificultad para respirar. °· Tiene fiebre y escalofríos, y también siente los siguientes síntomas: °? Ansiedad. °? Dolor de pecho o de espalda. °? Piel rosada o irritada. °? Piel pegajosa o sudorosa. °? Latidos cardíacos acelerados. °? Ganas de vomitar (náuseas). ° °Esta información no tiene como fin reemplazar el consejo del médico. Asegúrese de hacerle al médico cualquier pregunta que tenga. °Document Released: 08/21/2014 Document Revised: 08/21/2014 Document Reviewed: 06/10/2014 °Elsevier Interactive Patient Education © 2017 Elsevier Inc. ° °

## 2017-04-10 NOTE — Progress Notes (Signed)
Diagnosis: Anemia  Provider: Meredith Leeds. Joelyn Oms, MD  Procedure: Pt was typed and cross matched. 1 unit of blood was given.  Pt tolerated procedure.  Post procedure: Pt was alert, oriented and ambulatory at discharge. D/C instructions given to her with assistance from [place] interpreter. Pt voiced understanding.

## 2017-04-10 NOTE — Progress Notes (Signed)
[employer] interpreter used to obtain blood consent and educated pt about IV start/blood draw/transfusion. Pt verbalizes understanding and denies further questions. Coolidge Breeze, RN 04/10/2017

## 2017-04-11 LAB — TYPE AND SCREEN
ABO/RH(D): O POS
ANTIBODY SCREEN: NEGATIVE
Unit division: 0

## 2017-04-11 LAB — BPAM RBC
Blood Product Expiration Date: 201810012359
ISSUE DATE / TIME: 201808281047
UNIT TYPE AND RH: 5100

## 2017-04-24 ENCOUNTER — Inpatient Hospital Stay (HOSPITAL_COMMUNITY)
Admission: RE | Admit: 2017-04-24 | Discharge: 2017-04-24 | Disposition: A | Discharge status: Home / Self Care | Payer: Self-pay | Source: Ambulatory Visit | Attending: Nephrology | Admitting: Nephrology

## 2017-04-24 ENCOUNTER — Emergency Department (HOSPITAL_COMMUNITY)
Admission: EM | Admit: 2017-04-24 | Discharge: 2017-04-25 | Disposition: A | Discharge status: Home / Self Care | Payer: BLUE CROSS/BLUE SHIELD | Attending: Emergency Medicine | Admitting: Emergency Medicine

## 2017-04-24 ENCOUNTER — Other Ambulatory Visit (HOSPITAL_COMMUNITY): Payer: Self-pay | Admitting: *Deleted

## 2017-04-24 ENCOUNTER — Encounter (HOSPITAL_COMMUNITY): Payer: Self-pay

## 2017-04-24 DIAGNOSIS — D649 Anemia, unspecified: Secondary | ICD-10-CM

## 2017-04-24 DIAGNOSIS — Z79899 Other long term (current) drug therapy: Secondary | ICD-10-CM | POA: Diagnosis not present

## 2017-04-24 DIAGNOSIS — Z992 Dependence on renal dialysis: Secondary | ICD-10-CM | POA: Diagnosis not present

## 2017-04-24 DIAGNOSIS — F419 Anxiety disorder, unspecified: Secondary | ICD-10-CM | POA: Insufficient documentation

## 2017-04-24 DIAGNOSIS — F329 Major depressive disorder, single episode, unspecified: Secondary | ICD-10-CM | POA: Insufficient documentation

## 2017-04-24 DIAGNOSIS — E211 Secondary hyperparathyroidism, not elsewhere classified: Secondary | ICD-10-CM | POA: Insufficient documentation

## 2017-04-24 DIAGNOSIS — I120 Hypertensive chronic kidney disease with stage 5 chronic kidney disease or end stage renal disease: Secondary | ICD-10-CM | POA: Insufficient documentation

## 2017-04-24 DIAGNOSIS — Z5189 Encounter for other specified aftercare: Secondary | ICD-10-CM | POA: Diagnosis present

## 2017-04-24 DIAGNOSIS — N186 End stage renal disease: Secondary | ICD-10-CM

## 2017-04-24 LAB — PREPARE RBC (CROSSMATCH)

## 2017-04-24 LAB — CBC
HEMATOCRIT: 30.3 % — AB (ref 36.0–46.0)
Hemoglobin: 9.4 g/dL — ABNORMAL LOW (ref 12.0–15.0)
MCH: 30.1 pg (ref 26.0–34.0)
MCHC: 31 g/dL (ref 30.0–36.0)
MCV: 97.1 fL (ref 78.0–100.0)
Platelets: 193 10*3/uL (ref 150–400)
RBC: 3.12 MIL/uL — ABNORMAL LOW (ref 3.87–5.11)
RDW: 15.1 % (ref 11.5–15.5)
WBC: 5 10*3/uL (ref 4.0–10.5)

## 2017-04-24 MED ORDER — SODIUM CHLORIDE 0.9 % IV SOLN: 10.0000 mL/h | Freq: Once | INTRAVENOUS | Status: DC

## 2017-04-24 NOTE — Progress Notes (Signed)
PT showed up 2.5 hours late for her appointment.  She does not speak english.  We Got our tele interpreter to use for our conversation.  We explained to the  patient about being on time and that since she was so late we would be unable to give her the blood today because our unit would close. We were going to try to type and cross her today and give her blood tomorrow.  Pt told us that she has dialysis tomorrow and couldn't do that day.  So we offered Thursday.  We called the [facility] and I spoke to [place].  She said that the patient really needed to get the blood.  I offered to send her to the ED if Thursday was to far off.  We decided the let the patient choose.  The patient said she wanted to go to ED today to get the blood but she had to be done by 1330 because she had to get her kid and and she didn't have anyone else to pick her up.  She also said that she was also not able to stay during he day for the blood that she would have to leave by 1330.  She is going to get 2 units of blood un over 3.5 hours each.  There is no was she would be done in time.  So she decided to go get her kid and then come to the ED after she has her child and get her blood tonight.  I called [facility] nurse in the ED to give them a heads up

## 2017-04-24 NOTE — ED Triage Notes (Signed)
Patient here to have blood transfusion for anemia. Was scheduled this am for short stay and was late and they are now closed. Has hx of receiving transfusions for her anemia. Denies pain, alert and oriented, NAD
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
## 2017-04-26 LAB — BPAM RBC
BLOOD PRODUCT EXPIRATION DATE: 201810052359
Blood Product Expiration Date: 201810052359
ISSUE DATE / TIME: 201809112328
ISSUE DATE / TIME: 201809120346
UNIT TYPE AND RH: 5100
Unit Type and Rh: 5100

## 2017-04-26 LAB — TYPE AND SCREEN
ABO/RH(D): O POS
Antibody Screen: NEGATIVE
Unit division: 0
Unit division: 0

## 2017-11-29 ENCOUNTER — Other Ambulatory Visit: Payer: Self-pay

## 2017-11-29 DIAGNOSIS — T81510A Adhesions due to foreign body accidentally left in body following surgical operation, initial encounter: Secondary | ICD-10-CM

## 2017-12-06 ENCOUNTER — Ambulatory Visit: Payer: BLUE CROSS/BLUE SHIELD | Admitting: Family

## 2017-12-06 ENCOUNTER — Other Ambulatory Visit (HOSPITAL_COMMUNITY): Payer: BLUE CROSS/BLUE SHIELD

## 2017-12-20 ENCOUNTER — Ambulatory Visit (HOSPITAL_COMMUNITY)
Admission: RE | Admit: 2017-12-20 | Discharge: 2017-12-20 | Disposition: A | Discharge status: Home / Self Care | Payer: BLUE CROSS/BLUE SHIELD | Source: Ambulatory Visit | Attending: Family | Admitting: Family

## 2017-12-20 ENCOUNTER — Other Ambulatory Visit: Payer: Self-pay

## 2017-12-20 ENCOUNTER — Ambulatory Visit: Payer: BLUE CROSS/BLUE SHIELD | Admitting: Family

## 2017-12-20 ENCOUNTER — Encounter: Payer: Self-pay | Admitting: Family

## 2017-12-20 VITALS — BP 130/86 | HR 71 | Temp 97.6°F | Resp 16 | Ht 60.0 in | Wt 106.0 lb

## 2017-12-20 DIAGNOSIS — T81510A Adhesions due to foreign body accidentally left in body following surgical operation, initial encounter: Secondary | ICD-10-CM | POA: Diagnosis present

## 2017-12-20 DIAGNOSIS — Z992 Dependence on renal dialysis: Secondary | ICD-10-CM

## 2017-12-20 DIAGNOSIS — Z9889 Other specified postprocedural states: Secondary | ICD-10-CM

## 2017-12-20 DIAGNOSIS — N186 End stage renal disease: Secondary | ICD-10-CM

## 2017-12-20 DIAGNOSIS — M7918 Myalgia, other site: Secondary | ICD-10-CM

## 2017-12-20 DIAGNOSIS — X58XXXA Exposure to other specified factors, initial encounter: Secondary | ICD-10-CM | POA: Diagnosis not present

## 2017-12-20 NOTE — Progress Notes (Signed)
CC: Left deltoid pain, history of  AV fistula creation left arm   History of Present Illness  Katie Lyons is a 32 y.o. (05-15-1986) female who is s/p left arm brachial-cephalic av fistula creation on 04-13-16 by Dr. Donzetta Matters.  Pt speaks Spanish. Today's interview was conducted through an interpreter.    She dialyzes M-W-F via left upper arm AV fistula, at [facility] [place].   She was last evaluated in our practice by Dr. Donzetta Matters on 05-12-16. At that time*pt was s/p recent hospitalization with right femoral graft excision patch angioplasty of her femoral artery and placement of right IJ tunneled dialysis catheter and left brachiocephalic AV fistula on the left. She was feeling very well, without complaint her of her left hand, her right foot is good and she was healing her incisions in her right groin without issue.  She was dialyzing Monday Wednesday Friday via her right IJ tunneled dialysis catheter which was working well. Dr. Donzetta Matters advised follow-up in 2 months with left upper extremity AV access duplex. She was to follow sooner if she had any issues from her incisions or from her tunneled dialysis catheter standpoint. We will otherwise plan to see her with a duplex in 2 months.  She was lost to follow up until today.   She returns today at the request of Dr. Joelyn Oms. Pt states she had pain in her left deltoid area twice during dialysis, and no other time. The pain occurred at her last two HD sessions. She denies any steal sx's in her left hand: denies tingling or numbness, denies cool sensation, denies pain.   She does not know why her kidney function failed. She does not have DM. She has been on HD for 6 years.      Past Medical History:  Diagnosis Date  . Anxiety   . Chronic ischemic heart disease   . Depression    3-4 years ago per husband  . End stage renal disease ([place])   . Headache   . Hypertension   . Iron deficiency   . Secondary hyperparathyroidism ([place])    . Traumatic brain injury [facility])     Social History Social History   Tobacco Use  . Smoking status: Never Smoker  . Smokeless tobacco: Never Used  Substance Use Topics  . Alcohol use: No  . Drug use: No    Family History No family history on file.  Surgical History Past Surgical History:  Procedure Laterality Date  . ARTERIOVENOUS GRAFT PLACEMENT    . AV FISTULA PLACEMENT Left 04/13/2016   Procedure: UPPER EXTREMITY LEFT  ARTERIOVENOUS (AV) FISTULA CREATION;  Surgeon: Waynetta Sandy, MD;  Location: [place];  Service: Vascular;  Laterality: Left;  . [place] REMOVAL Right 04/10/2016   Procedure: REMOVAL OF Right Leg ARTERIOVENOUS GORETEX GRAFT ([place]);  Surgeon: Waynetta Sandy, MD;  Location: [place];  Service: Vascular;  Laterality: Right;  . INSERTION OF DIALYSIS CATHETER Right 04/10/2016   Procedure: insertion Dialysis catheter;  Surgeon: Waynetta Sandy, MD;  Location: [place];  Service: Vascular;  Laterality: Right;  . INSERTION OF DIALYSIS CATHETER Right 04/13/2016   Procedure: INSERTION OF DIALYSIS CATHETER RIGHT INTERNAL JUGULAR;  Surgeon: Waynetta Sandy, MD;  Location: [place];  Service: Vascular;  Laterality: Right;  . NO PAST SURGERIES    . REVISION OF ARTERIOVENOUS GORETEX GRAFT Right 01/18/2016   Procedure: REVISION OF ARTERIOVENOUS GORETEX GRAFT Right leg;  Surgeon: Rosetta Posner, MD;  Location: [place];  Service: Vascular;  Laterality: Right;    No Known Allergies  Current Outpatient Medications  Medication Sig Dispense Refill  . acetaminophen (TYLENOL) 500 MG tablet Take 500 mg by mouth every 6 (six) hours as needed for mild pain.    Marland Kitchen amLODipine (NORVASC) 10 MG tablet Take 10 mg by mouth daily.    . carvedilol (COREG) 25 MG tablet Take 25 mg by mouth 2 (two) times daily with a meal.     . lisinopril (PRINIVIL,ZESTRIL) 40 MG tablet Take 40 mg by mouth at bedtime.    . sevelamer carbonate (RENVELA) 800 MG tablet Take 2,400 mg by mouth 2 (two)  times daily.      No current facility-administered medications for this visit.      REVIEW OF SYSTEMS: see HPI for pertinent positives and negatives    PHYSICAL EXAMINATION:  Vitals:   12/20/17 1046  BP: 130/86  Pulse: 71  Resp: 16  Temp: 97.6 F (36.4 C)  TempSrc: Oral  Weight: 106 lb (48.1 kg)  Height: 5' (1.524 m)  HC: 100" (254 cm)   Body mass index is 20.7 kg/m.  General: Petite female in NAD HEENT:  No gross abnormalities Pulmonary: Respirations are non-labored, CTAB Abdomen: Soft and non-tender  Musculoskeletal: There are no major deformities.   Neurologic: No focal weakness or paresthesias are detected. No tenderness to palpation of left arm.  Skin: There are no ulcer or rashes noted. Psychiatric: The patient has normal affect. Cardiovascular: There is a regular rate and rhythm without significant murmur appreciated.  Bilateral radial pulses are 1+ palpable. There is a palpable thrill in left upper arm AV fistula. There is mild aneurysmal dilatation.   Non-Invasive Vascular Imaging  Left arm Access Duplex  (Date: 12/20/2017):  Distal brachial artery: 339 cm/s. Aneurysm noted  Left radial artery: PSV with compression: 59 cm/s; PSV w/o compression: 28 cm/s AV fistula is not WNL due to abnormal aneurysmal dilatation in the distal upper arm Velocities in the radial artery doubled with compression of the fistula.    Outside Studies/Documentation 3 pages of  documents were reviewed.  Medical Decision Making  Katie Lyons is a 32 y.o. female who is s/p left arm brachial-cephalic av fistula creation on 04-13-16 by Dr. Donzetta Matters.  I discussed with Dr. Oneida Alar pt HPI, AV fistula duplex results from today, and 1+ palpable left radial pulse, no steal sx's in left upper extremity.  Pt had left deltoid pain during the last 2 HD sessions and at no other time. She feels this may be due to positioning of her arm during HD. I advised her to work with the [facility] staff re  this, and consider placing a jacket on her shoulders during HD.  Follow up as needed.    Clemon Chambers, RN, MSN, FNP-C [facility] Office: [phone]  12/20/2017, 10:53 AM  Clinic MD: Oneida Alar

## 2018-04-14 HISTORY — PX: OTHER SURGICAL HISTORY: SHX169

## 2018-04-17 MED ORDER — LABETALOL HCL 5 MG/ML IV SOLN
10.00 | INTRAVENOUS | Status: DC
Start: ? — End: 2018-04-17

## 2018-04-17 MED ORDER — ASPIRIN EC 81 MG PO TBEC
81.00 | DELAYED_RELEASE_TABLET | ORAL | Status: DC
Start: 2018-04-18 — End: 2018-04-17

## 2018-04-17 MED ORDER — SODIUM CHLORIDE 0.9 % IV SOLN
INTRAVENOUS | Status: DC
Start: ? — End: 2018-04-17

## 2018-04-17 MED ORDER — DOCUSATE SODIUM 100 MG PO CAPS
100.00 | ORAL_CAPSULE | ORAL | Status: DC
Start: 2018-04-18 — End: 2018-04-17

## 2018-04-17 MED ORDER — POLYETHYLENE GLYCOL 3350 17 G PO PACK
17.00 g | PACK | ORAL | Status: DC
Start: 2018-04-18 — End: 2018-04-17

## 2018-04-17 MED ORDER — SULFAMETHOXAZOLE-TRIMETHOPRIM 400-80 MG PO TABS
1.00 | ORAL_TABLET | ORAL | Status: DC
Start: 2018-04-19 — End: 2018-04-17

## 2018-04-17 MED ORDER — MYCOPHENOLATE SODIUM 360 MG PO TBEC
720.00 | DELAYED_RELEASE_TABLET | ORAL | Status: DC
Start: 2018-04-17 — End: 2018-04-17

## 2018-04-17 MED ORDER — TACROLIMUS 1 MG PO CAPS
2.00 | ORAL_CAPSULE | ORAL | Status: DC
Start: 2018-04-17 — End: 2018-04-17

## 2018-04-17 MED ORDER — ONDANSETRON 4 MG PO TBDP
4.00 | ORAL_TABLET | ORAL | Status: DC
Start: ? — End: 2018-04-17

## 2018-04-17 MED ORDER — BISACODYL 10 MG RE SUPP
10.00 | RECTAL | Status: DC
Start: 2018-04-18 — End: 2018-04-17

## 2018-04-17 MED ORDER — HYDROCODONE-ACETAMINOPHEN 5-325 MG PO TABS
1.00 | ORAL_TABLET | ORAL | Status: DC
Start: ? — End: 2018-04-17

## 2018-04-17 MED ORDER — PREDNISONE 10 MG PO TABS
20.00 | ORAL_TABLET | ORAL | Status: DC
Start: 2018-04-18 — End: 2018-04-17

## 2018-04-17 MED ORDER — FLUCONAZOLE 50 MG PO TABS
50.00 | ORAL_TABLET | ORAL | Status: DC
Start: 2018-04-18 — End: 2018-04-17

## 2018-04-17 MED ORDER — PANTOPRAZOLE SODIUM 40 MG PO TBEC
40.00 | DELAYED_RELEASE_TABLET | ORAL | Status: DC
Start: 2018-04-18 — End: 2018-04-17

## 2018-05-31 DIAGNOSIS — T8691 Unspecified transplanted organ and tissue rejection: Secondary | ICD-10-CM | POA: Insufficient documentation

## 2018-06-04 ENCOUNTER — Encounter: Payer: Self-pay | Admitting: Physician Assistant

## 2018-06-04 ENCOUNTER — Other Ambulatory Visit: Payer: Self-pay

## 2018-06-04 ENCOUNTER — Ambulatory Visit: Payer: BLUE CROSS/BLUE SHIELD | Admitting: Physician Assistant

## 2018-06-04 VITALS — BP 120/76 | HR 77 | Temp 98.4°F | Resp 16 | Ht 59.5 in | Wt 112.2 lb

## 2018-06-04 DIAGNOSIS — H6121 Impacted cerumen, right ear: Secondary | ICD-10-CM

## 2018-06-04 DIAGNOSIS — H918X1 Other specified hearing loss, right ear: Secondary | ICD-10-CM | POA: Diagnosis not present

## 2018-06-04 NOTE — Patient Instructions (Addendum)
No utilice puntas Q, ya que esto afectar an ms la cera del odo en su odo.  Si tiene impactacin de cera en los odos ms de una vez al [employer], puede reducir los casos utilizando una bola de algodn sumergida en aceite mineral y Lawyer en el canal externo durante 10-20 minutos una vez por semana.  Tambin puedes probar un ablandador de heces lquido. Funciona disolviendo o aflojando la cera de los odos, lo que permite que se elimine ms fcilmente en el riego. Acustese de lado con la oreja afectada hacia arriba e inculque 1 ml (aproximadamente 15 gotas) de Colace lquido (no caliente) en el odo. Deje que el lquido repose en el odo durante 10-15 minutos para que pueda funcionar. Despus de 15 minutos, enjuague la oreja afectada con agua tibia para que pueda sacar la cera suavizada de su canal.   Acumulacin de cera en el odo (Earwax Buildup) Los odos producen una sustancia llamada cera. Tambin se puede llamar cerumen. A veces, se acumula demasiada cera en el canal del odo. Esto puede causar dolor de odos y dificultar la audicin. CAUSAS Esta afeccin es causada por demasiada produccin o acumulacin de cera. [place] pueden hacer que usted sea propenso a sufrir esta afeccin:  Limpiarse con frecuencia las orejas con hisopos.  Tener los canales [employer] estrechos.  Tener cera que es demasiado densa o pegajosa.  Tener eccema.  Estar deshidratado. SNTOMAS Los sntomas de esta afeccin incluyen lo siguiente:  Disminucin de la audicin.  Secrecin del odo.  Dolor de odo.  Picazn en el odo.  Una sensacin de que el odo est lleno u obstruido.  Zumbidos en el odo.  Tos. DIAGNSTICO El mdico puede diagnosticar esta afeccin en funcin de los sntomas y la historia clnica. El mdico tambin har un examen del odo y Psychologist, counselling el interior del odo con un dispositivo llamado otoscopio. Tambin pueden hacerle un estudio de  audicin. TRATAMIENTO El tratamiento de esta afeccin incluye lo siguiente:  Gotas ticas de venta libre o recetadas para ablandar la cera.  Extraccin de cera por un mdico. Esto se puede realizar de las siguientes [place]: ? Purgar el odo con agua a la temperatura del cuerpo. ? Con un instrumento mdico que tiene un rulo en el extremo (cureta para la cera). ? Con un dispositivo para aspirar. Rivereno los medicamentos de venta libre y los recetados solamente como se lo haya indicado el mdico.  No introduzca ningn objeto en su odo, ni siquiera hisopos. La abertura del canal auditivo se puede limpiar con un pao.  Beba suficiente agua para mantener la orina clara o de color amarillo plido.  Si tiene con frecuencia acumulacin de cera en el odo o [place] audfonos, concurra a su mdico cada 6 o 12 meses para una rutina preventiva de limpieza del odo. Concurra a todas las visitas de control como se lo haya indicado el mdico. SOLICITE ATENCIN MDICA SI:  Siente dolor de odos.  La afeccin no mejora con tratamiento.  Tiene prdida de la audicin.  Observa sangre, pus u otro lquido que salen del odo. Esta informacin no tiene Marine scientist el consejo del mdico. Asegrese de hacerle al mdico cualquier pregunta que tenga. Document Released: 07/31/2005 Document Revised: 11/22/2015 Elsevier Interactive Patient Education  2017 [employer].  ------------------------------------------------------------------------ Please do not use Q-tips, as this will further impact the ear wax in your ear.   If you have  ear wax impaction more than once a year you can reduce the occurrences by using a cotton ball dipped in mineral oil and place it in the external canal for 10-20 minutes once a week.   You can also try a liquid stool softener. It works by dissolving or loosening the earwax, allowing it to be more easily removed upon irrigation. Lie on  your side with the affected ear facing up and instill warmed (not hot) 1 mL (about 15 drops) of liquid Colace into the ear. Let the liquid sit in the ear for 10-15 minutes so it can work. After 15 minutes, rinse the affected ear with warm water so that you can get the softened earwax out of your canal.
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
## 2018-07-23 DIAGNOSIS — A09 Infectious gastroenteritis and colitis, unspecified: Secondary | ICD-10-CM | POA: Insufficient documentation

## 2018-10-18 DIAGNOSIS — B259 Cytomegaloviral disease, unspecified: Secondary | ICD-10-CM | POA: Insufficient documentation

## 2018-10-31 ENCOUNTER — Other Ambulatory Visit: Payer: Self-pay

## 2018-10-31 ENCOUNTER — Emergency Department (HOSPITAL_COMMUNITY): Payer: BLUE CROSS/BLUE SHIELD

## 2018-10-31 ENCOUNTER — Encounter (HOSPITAL_COMMUNITY): Payer: Self-pay

## 2018-10-31 ENCOUNTER — Inpatient Hospital Stay (HOSPITAL_COMMUNITY)
Admission: EM | Admit: 2018-10-31 | Discharge: 2018-11-02 | DRG: 872 | Disposition: A | Discharge status: Home / Self Care | Payer: BLUE CROSS/BLUE SHIELD | Attending: Internal Medicine | Admitting: Internal Medicine

## 2018-10-31 DIAGNOSIS — R6520 Severe sepsis without septic shock: Secondary | ICD-10-CM | POA: Diagnosis present

## 2018-10-31 DIAGNOSIS — D631 Anemia in chronic kidney disease: Secondary | ICD-10-CM | POA: Diagnosis present

## 2018-10-31 DIAGNOSIS — B9789 Other viral agents as the cause of diseases classified elsewhere: Secondary | ICD-10-CM | POA: Diagnosis present

## 2018-10-31 DIAGNOSIS — Z87820 Personal history of traumatic brain injury: Secondary | ICD-10-CM

## 2018-10-31 DIAGNOSIS — Z20828 Contact with and (suspected) exposure to other viral communicable diseases: Secondary | ICD-10-CM

## 2018-10-31 DIAGNOSIS — A4189 Other specified sepsis: Principal | ICD-10-CM | POA: Diagnosis present

## 2018-10-31 DIAGNOSIS — D72819 Decreased white blood cell count, unspecified: Secondary | ICD-10-CM | POA: Diagnosis not present

## 2018-10-31 DIAGNOSIS — Z7952 Long term (current) use of systemic steroids: Secondary | ICD-10-CM

## 2018-10-31 DIAGNOSIS — R509 Fever, unspecified: Secondary | ICD-10-CM | POA: Diagnosis present

## 2018-10-31 DIAGNOSIS — I259 Chronic ischemic heart disease, unspecified: Secondary | ICD-10-CM | POA: Diagnosis present

## 2018-10-31 DIAGNOSIS — B348 Other viral infections of unspecified site: Secondary | ICD-10-CM | POA: Diagnosis not present

## 2018-10-31 DIAGNOSIS — F419 Anxiety disorder, unspecified: Secondary | ICD-10-CM | POA: Diagnosis present

## 2018-10-31 DIAGNOSIS — K219 Gastro-esophageal reflux disease without esophagitis: Secondary | ICD-10-CM | POA: Diagnosis present

## 2018-10-31 DIAGNOSIS — N390 Urinary tract infection, site not specified: Secondary | ICD-10-CM | POA: Diagnosis present

## 2018-10-31 DIAGNOSIS — I120 Hypertensive chronic kidney disease with stage 5 chronic kidney disease or end stage renal disease: Secondary | ICD-10-CM | POA: Diagnosis present

## 2018-10-31 DIAGNOSIS — F329 Major depressive disorder, single episode, unspecified: Secondary | ICD-10-CM | POA: Diagnosis present

## 2018-10-31 DIAGNOSIS — R5381 Other malaise: Secondary | ICD-10-CM | POA: Diagnosis not present

## 2018-10-31 DIAGNOSIS — R7989 Other specified abnormal findings of blood chemistry: Secondary | ICD-10-CM | POA: Diagnosis present

## 2018-10-31 DIAGNOSIS — A419 Sepsis, unspecified organism: Secondary | ICD-10-CM | POA: Diagnosis present

## 2018-10-31 DIAGNOSIS — B9620 Unspecified Escherichia coli [E. coli] as the cause of diseases classified elsewhere: Secondary | ICD-10-CM | POA: Diagnosis present

## 2018-10-31 DIAGNOSIS — D72810 Lymphocytopenia: Secondary | ICD-10-CM | POA: Diagnosis present

## 2018-10-31 DIAGNOSIS — Z79899 Other long term (current) drug therapy: Secondary | ICD-10-CM | POA: Diagnosis not present

## 2018-10-31 DIAGNOSIS — R945 Abnormal results of liver function studies: Secondary | ICD-10-CM | POA: Diagnosis present

## 2018-10-31 DIAGNOSIS — I10 Essential (primary) hypertension: Secondary | ICD-10-CM | POA: Diagnosis present

## 2018-10-31 DIAGNOSIS — Z940 Kidney transplant status: Secondary | ICD-10-CM

## 2018-10-31 DIAGNOSIS — N189 Chronic kidney disease, unspecified: Secondary | ICD-10-CM

## 2018-10-31 DIAGNOSIS — N2581 Secondary hyperparathyroidism of renal origin: Secondary | ICD-10-CM | POA: Diagnosis present

## 2018-10-31 DIAGNOSIS — R5383 Other fatigue: Secondary | ICD-10-CM | POA: Diagnosis not present

## 2018-10-31 DIAGNOSIS — R300 Dysuria: Secondary | ICD-10-CM | POA: Diagnosis not present

## 2018-10-31 DIAGNOSIS — R740 Nonspecific elevation of levels of transaminase and lactic acid dehydrogenase [LDH]: Secondary | ICD-10-CM | POA: Diagnosis not present

## 2018-10-31 LAB — HEPATIC FUNCTION PANEL
ALT: 153 U/L — ABNORMAL HIGH (ref 0–44)
AST: 70 U/L — ABNORMAL HIGH (ref 15–41)
Albumin: 3.5 g/dL (ref 3.5–5.0)
Alkaline Phosphatase: 295 U/L — ABNORMAL HIGH (ref 38–126)
Bilirubin, Direct: 0.2 mg/dL (ref 0.0–0.2)
Indirect Bilirubin: 0.5 mg/dL (ref 0.3–0.9)
Total Bilirubin: 0.7 mg/dL (ref 0.3–1.2)
Total Protein: 6.7 g/dL (ref 6.5–8.1)

## 2018-10-31 LAB — RESPIRATORY PANEL BY PCR
Adenovirus: NOT DETECTED
Bordetella pertussis: NOT DETECTED
CHLAMYDOPHILA PNEUMONIAE-RVPPCR: NOT DETECTED
CORONAVIRUS NL63-RVPPCR: NOT DETECTED
Coronavirus 229E: NOT DETECTED
Coronavirus HKU1: NOT DETECTED
Coronavirus OC43: NOT DETECTED
INFLUENZA A-RVPPCR: NOT DETECTED
Influenza B: NOT DETECTED
Metapneumovirus: NOT DETECTED
Mycoplasma pneumoniae: NOT DETECTED
Parainfluenza Virus 1: NOT DETECTED
Parainfluenza Virus 2: NOT DETECTED
Parainfluenza Virus 3: NOT DETECTED
Parainfluenza Virus 4: NOT DETECTED
Respiratory Syncytial Virus: NOT DETECTED
Rhinovirus / Enterovirus: DETECTED — AB

## 2018-10-31 LAB — INFLUENZA PANEL BY PCR (TYPE A & B)
INFLAPCR: NEGATIVE
INFLBPCR: NEGATIVE

## 2018-10-31 LAB — URINALYSIS, ROUTINE W REFLEX MICROSCOPIC
BACTERIA UA: NONE SEEN
BILIRUBIN URINE: NEGATIVE
Glucose, UA: NEGATIVE mg/dL
KETONES UR: NEGATIVE mg/dL
LEUKOCYTE UA: NEGATIVE
Nitrite: NEGATIVE
Protein, ur: NEGATIVE mg/dL
Specific Gravity, Urine: 1.009 (ref 1.005–1.030)
pH: 8 (ref 5.0–8.0)

## 2018-10-31 LAB — CBC
HCT: 37.2 % (ref 36.0–46.0)
Hemoglobin: 11.4 g/dL — ABNORMAL LOW (ref 12.0–15.0)
MCH: 29.8 pg (ref 26.0–34.0)
MCHC: 30.6 g/dL (ref 30.0–36.0)
MCV: 97.1 fL (ref 80.0–100.0)
NRBC: 0 % (ref 0.0–0.2)
PLATELETS: 180 10*3/uL (ref 150–400)
RBC: 3.83 MIL/uL — ABNORMAL LOW (ref 3.87–5.11)
RDW: 16.6 % — AB (ref 11.5–15.5)
WBC: 3.6 10*3/uL — ABNORMAL LOW (ref 4.0–10.5)

## 2018-10-31 LAB — CBC WITH DIFFERENTIAL/PLATELET
ABS IMMATURE GRANULOCYTES: 0 10*3/uL (ref 0.00–0.07)
BASOS ABS: 0.1 10*3/uL (ref 0.0–0.1)
Basophils Relative: 2 %
Eosinophils Absolute: 0 10*3/uL (ref 0.0–0.5)
Eosinophils Relative: 0 %
HCT: 38.7 % (ref 36.0–46.0)
HEMOGLOBIN: 12.4 g/dL (ref 12.0–15.0)
LYMPHS PCT: 10 %
Lymphs Abs: 0.4 10*3/uL — ABNORMAL LOW (ref 0.7–4.0)
MCH: 30.2 pg (ref 26.0–34.0)
MCHC: 32 g/dL (ref 30.0–36.0)
MCV: 94.4 fL (ref 80.0–100.0)
MONOS PCT: 4 %
Monocytes Absolute: 0.1 10*3/uL (ref 0.1–1.0)
NEUTROS PCT: 84 %
NRBC: 0 % (ref 0.0–0.2)
NRBC: 1 /100{WBCs} — AB
Neutro Abs: 2.9 10*3/uL (ref 1.7–7.7)
PLATELETS: 199 10*3/uL (ref 150–400)
RBC: 4.1 MIL/uL (ref 3.87–5.11)
RDW: 16.4 % — AB (ref 11.5–15.5)
WBC: 3.5 10*3/uL — ABNORMAL LOW (ref 4.0–10.5)

## 2018-10-31 LAB — BASIC METABOLIC PANEL
Anion gap: 9 (ref 5–15)
Anion gap: 7 (ref 5–15)
BUN: 17 mg/dL (ref 6–20)
BUN: 14 mg/dL (ref 6–20)
CALCIUM: 9.6 mg/dL (ref 8.9–10.3)
CO2: 17 mmol/L — AB (ref 22–32)
CO2: 16 mmol/L — ABNORMAL LOW (ref 22–32)
CREATININE: 1.08 mg/dL — AB (ref 0.44–1.00)
Calcium: 8.9 mg/dL (ref 8.9–10.3)
Chloride: 107 mmol/L (ref 98–111)
Chloride: 113 mmol/L — ABNORMAL HIGH (ref 98–111)
Creatinine, Ser: 1.07 mg/dL — ABNORMAL HIGH (ref 0.44–1.00)
GFR calc Af Amer: >60 mL/min (ref >60)
GFR calc non Af Amer: >60 mL/min (ref >60)
GFR calc non Af Amer: >60 mL/min (ref >60)
GLUCOSE: 125 mg/dL — AB (ref 70–99)
Glucose, Bld: 130 mg/dL — ABNORMAL HIGH (ref 70–99)
Potassium: 3.8 mmol/L (ref 3.5–5.1)
Potassium: 4.4 mmol/L (ref 3.5–5.1)
Sodium: 133 mmol/L — ABNORMAL LOW (ref 135–145)
Sodium: 136 mmol/L (ref 135–145)

## 2018-10-31 LAB — HIV ANTIBODY (ROUTINE TESTING W REFLEX): HIV Screen 4th Generation wRfx: NONREACTIVE

## 2018-10-31 LAB — LACTIC ACID, PLASMA
LACTIC ACID, VENOUS: 0.7 mmol/L (ref 0.5–1.9)
Lactic Acid, Venous: 0.8 mmol/L (ref 0.5–1.9)

## 2018-10-31 LAB — ACETAMINOPHEN LEVEL: Acetaminophen (Tylenol), Serum: 12 ug/mL (ref 10–30)

## 2018-10-31 LAB — C-REACTIVE PROTEIN: CRP: 8.4 mg/dL — ABNORMAL HIGH (ref <1.0)

## 2018-10-31 LAB — LACTATE DEHYDROGENASE: LDH: 246 U/L — ABNORMAL HIGH (ref 98–192)

## 2018-10-31 LAB — PROCALCITONIN: Procalcitonin: 0.33 ng/mL

## 2018-10-31 MED ORDER — MORPHINE SULFATE (PF) 4 MG/ML IV SOLN: 4.0000 mg | Freq: Once | INTRAVENOUS | Status: DC

## 2018-10-31 MED ORDER — VALGANCICLOVIR HCL 450 MG PO TABS
900.0000 mg | ORAL_TABLET | Freq: Two times a day (BID) | ORAL | Status: DC
  Administered 2018-10-31 – 2018-11-02 (×5): 900 mg via ORAL
  Filled 2018-10-31 (×6): qty 2

## 2018-10-31 MED ORDER — ONDANSETRON HCL 4 MG PO TABS: 4.0000 mg | ORAL_TABLET | Freq: Four times a day (QID) | ORAL | Status: DC | PRN

## 2018-10-31 MED ORDER — SODIUM CHLORIDE 0.9 % IV BOLUS
1000.0000 mL | Freq: Once | INTRAVENOUS | Status: AC
  Administered 2018-10-31: 1000 mL via INTRAVENOUS

## 2018-10-31 MED ORDER — HEPARIN SODIUM (PORCINE) 5000 UNIT/ML IJ SOLN
5000.0000 [IU] | Freq: Three times a day (TID) | INTRAMUSCULAR | Status: DC
  Administered 2018-10-31 – 2018-11-01 (×4): 5000 [IU] via SUBCUTANEOUS
  Filled 2018-10-31 (×5): qty 1

## 2018-10-31 MED ORDER — VANCOMYCIN HCL 1000 MG IV SOLR
1000.0000 mg | Freq: Two times a day (BID) | INTRAVENOUS | Status: DC
  Administered 2018-11-01 – 2018-11-02 (×3): 1000 mg via INTRAVENOUS
  Filled 2018-10-31 (×3): qty 1000

## 2018-10-31 MED ORDER — SODIUM CHLORIDE 0.9 % IV SOLN
INTRAVENOUS | Status: DC | PRN
  Administered 2018-10-31: 500 mL via INTRAVENOUS

## 2018-10-31 MED ORDER — ACETAMINOPHEN 325 MG PO TABS
650.0000 mg | ORAL_TABLET | Freq: Once | ORAL | Status: AC
Start: 2018-10-31 — End: 2018-10-31
  Administered 2018-10-31: 650 mg via ORAL
  Filled 2018-10-31: qty 2

## 2018-10-31 MED ORDER — MORPHINE SULFATE (PF) 4 MG/ML IV SOLN
INTRAVENOUS | Status: AC
  Administered 2018-10-31: 01:00:00
  Filled 2018-10-31: qty 1

## 2018-10-31 MED ORDER — ACETAMINOPHEN 650 MG RE SUPP: 650.0000 mg | Freq: Four times a day (QID) | RECTAL | Status: DC | PRN

## 2018-10-31 MED ORDER — DIPHENHYDRAMINE HCL 50 MG/ML IJ SOLN
12.5000 mg | Freq: Once | INTRAMUSCULAR | Status: AC
  Administered 2018-10-31: 12.5 mg via INTRAVENOUS

## 2018-10-31 MED ORDER — SODIUM CHLORIDE 0.9 % IV SOLN
2.0000 g | Freq: Once | INTRAVENOUS | Status: AC
  Administered 2018-10-31: 2 g via INTRAVENOUS
  Filled 2018-10-31: qty 2

## 2018-10-31 MED ORDER — PANTOPRAZOLE SODIUM 40 MG PO TBEC
40.0000 mg | DELAYED_RELEASE_TABLET | Freq: Every day | ORAL | Status: DC
  Administered 2018-10-31 – 2018-11-02 (×3): 40 mg via ORAL
  Filled 2018-10-31 (×3): qty 1

## 2018-10-31 MED ORDER — VANCOMYCIN HCL IN DEXTROSE 1-5 GM/200ML-% IV SOLN
1000.0000 mg | Freq: Once | INTRAVENOUS | Status: AC
  Administered 2018-10-31: 1000 mg via INTRAVENOUS
  Filled 2018-10-31: qty 200

## 2018-10-31 MED ORDER — SULFAMETHOXAZOLE-TRIMETHOPRIM 400-80 MG PO TABS
1.0000 | ORAL_TABLET | ORAL | Status: DC
  Administered 2018-11-01: 1 via ORAL
  Filled 2018-10-31: qty 1

## 2018-10-31 MED ORDER — K PHOS MONO-SOD PHOS DI & MONO 155-852-130 MG PO TABS
250.0000 mg | ORAL_TABLET | Freq: Two times a day (BID) | ORAL | Status: DC
  Administered 2018-10-31 – 2018-11-02 (×5): 250 mg via ORAL
  Filled 2018-10-31 (×6): qty 1

## 2018-10-31 MED ORDER — PREDNISONE 10 MG PO TABS
10.0000 mg | ORAL_TABLET | Freq: Every day | ORAL | Status: DC
  Administered 2018-10-31 – 2018-11-02 (×3): 10 mg via ORAL
  Filled 2018-10-31 (×3): qty 1

## 2018-10-31 MED ORDER — TACROLIMUS 1 MG PO CAPS
5.0000 mg | ORAL_CAPSULE | Freq: Two times a day (BID) | ORAL | Status: DC
  Administered 2018-10-31 – 2018-11-02 (×5): 5 mg via ORAL
  Filled 2018-10-31 (×6): qty 5

## 2018-10-31 MED ORDER — DIPHENHYDRAMINE HCL 50 MG/ML IJ SOLN
INTRAMUSCULAR | Status: AC
  Filled 2018-10-31: qty 1

## 2018-10-31 MED ORDER — MYCOPHENOLATE SODIUM 180 MG PO TBEC
360.0000 mg | DELAYED_RELEASE_TABLET | Freq: Two times a day (BID) | ORAL | Status: DC
  Administered 2018-10-31 – 2018-11-02 (×5): 360 mg via ORAL
  Filled 2018-10-31 (×6): qty 2

## 2018-10-31 MED ORDER — ACETAMINOPHEN 325 MG PO TABS
650.0000 mg | ORAL_TABLET | Freq: Four times a day (QID) | ORAL | Status: DC | PRN
  Administered 2018-11-01: 650 mg via ORAL
  Filled 2018-10-31: qty 2

## 2018-10-31 MED ORDER — SODIUM CHLORIDE 0.9 % IV SOLN
1.0000 g | Freq: Three times a day (TID) | INTRAVENOUS | Status: DC
  Administered 2018-10-31 – 2018-11-02 (×7): 1 g via INTRAVENOUS
  Filled 2018-10-31 (×9): qty 1

## 2018-10-31 MED ORDER — HYDRALAZINE HCL 20 MG/ML IJ SOLN: 5.0000 mg | INTRAMUSCULAR | Status: DC | PRN

## 2018-10-31 MED ORDER — CARVEDILOL 25 MG PO TABS
25.0000 mg | ORAL_TABLET | Freq: Two times a day (BID) | ORAL | Status: DC
  Administered 2018-10-31 – 2018-11-02 (×6): 25 mg via ORAL
  Filled 2018-10-31 (×6): qty 1

## 2018-10-31 MED ORDER — DIPHENHYDRAMINE HCL 25 MG PO CAPS: 25.0000 mg | ORAL_CAPSULE | Freq: Three times a day (TID) | ORAL | Status: DC | PRN

## 2018-10-31 MED ORDER — SODIUM BICARBONATE 650 MG PO TABS
650.0000 mg | ORAL_TABLET | Freq: Two times a day (BID) | ORAL | Status: DC
  Administered 2018-10-31 – 2018-11-02 (×5): 650 mg via ORAL
  Filled 2018-10-31 (×5): qty 1

## 2018-10-31 MED ORDER — HYDROCORTISONE NA SUCCINATE PF 100 MG IJ SOLR
50.0000 mg | Freq: Once | INTRAMUSCULAR | Status: AC
  Administered 2018-10-31: 50 mg via INTRAVENOUS
  Filled 2018-10-31: qty 2

## 2018-10-31 MED ORDER — SODIUM CHLORIDE 0.9 % IV SOLN
INTRAVENOUS | Status: DC
  Administered 2018-10-31 – 2018-11-02 (×5): via INTRAVENOUS

## 2018-10-31 MED ORDER — ONDANSETRON HCL 4 MG/2ML IJ SOLN: 4.0000 mg | Freq: Four times a day (QID) | INTRAMUSCULAR | Status: DC | PRN

## 2018-10-31 MED ORDER — VANCOMYCIN HCL IN DEXTROSE 1-5 GM/200ML-% IV SOLN
1000.0000 mg | Freq: Two times a day (BID) | INTRAVENOUS | Status: DC
  Administered 2018-10-31: 1000 mg via INTRAVENOUS
  Filled 2018-10-31 (×2): qty 200

## 2018-10-31 NOTE — ED Triage Notes (Signed)
Pt coming by POV with complaints of "bone pain" or muscle pain, and fever that began yesterday. Pt is recent kidney transplant from 6 months ago. Pt taking Cipro for recent UTI.

## 2018-10-31 NOTE — H&P (Signed)
History and Physical    Katie Lyons RJJ:[id number] DOB: 23-Feb-1986 DOA: 10/31/2018  Referring MD/NP/PA:   PCP: Fleet Contras, MD   Patient coming from:  The patient is coming from home.  At baseline, pt is independent for most of ADL.        Chief Complaint: Fever, chills, body aches  HPI: Katie Lyons is a 33 y.o. female with medical history significant of ESRD s/p renal transplant in September 2019 at [facility], headaches, HTN, anemia, hx of TBI, secondary hyperparathyroidism, anxiety, depression, who presents with fever, chills, body aches.  Patient is a Paediatric nurse.  History is collected with help of translator.  Patient states that her symptoms started yesterday, including fever, chills, body aches, muscle pain and poor appetite.  No cough or runny nose.  No sore throat.  Patient denies shortness breath or chest pain.  No nausea, vomiting, diarrhea, abdominal pain. States she had fever at home, Tmax 100.75F. Her husband at home was sick with similar symptoms recently, but he is well at this time. She denies recent travel or COVID19 exposures.  She was diagnosed with UTI recently, started on ciprofloxacin yesterday.  She denies any symptoms of UTI. She has not missed of any of her anti-rejection medications.  ED Course: pt was found to have WBC 3.5, lactic acid of 0.8, negative urinalysis, procalcitonin 0.33, flu PCR negative, creatinine 1.07, BUN 17, temperature 102.4, tachycardia, tachypnea, oxygen saturation 98% on room air.  Chest x-ray negative.  Patient is admitted to stepdown as inpatient.  COVID19 test was sent out by ED physician.   Review of Systems:   General: has fevers, chills, no body weight gain, has poor appetite, has fatigue HEENT: no blurry vision, hearing changes or sore throat Respiratory: no dyspnea, coughing, wheezing CV: no chest pain, no palpitations GI: no nausea, vomiting, abdominal pain, diarrhea, constipation GU: no dysuria,  burning on urination, increased urinary frequency, hematuria  Ext: no leg edema Neuro: no unilateral weakness, numbness, or tingling, no vision change or hearing loss Skin: no rash, no skin tear. MSK: No muscle spasm, no deformity, no limitation of range of movement in spin Heme: No easy bruising.  Travel history: No recent long distant travel.  Allergy: No Known Allergies  Past Medical History:  Diagnosis Date  . Anxiety   . Chronic ischemic heart disease   . Depression    3-4 years ago per husband  . End stage renal disease ([place])   . Headache   . Hypertension   . Iron deficiency   . Secondary hyperparathyroidism ([place])   . Traumatic brain injury [facility])     Past Surgical History:  Procedure Laterality Date  . ARTERIOVENOUS GRAFT PLACEMENT    . AV FISTULA PLACEMENT Left 04/13/2016   Procedure: UPPER EXTREMITY LEFT  ARTERIOVENOUS (AV) FISTULA CREATION;  Surgeon: Waynetta Sandy, MD;  Location: [place];  Service: Vascular;  Laterality: Left;  . [place] REMOVAL Right 04/10/2016   Procedure: REMOVAL OF Right Leg ARTERIOVENOUS GORETEX GRAFT ([place]);  Surgeon: Waynetta Sandy, MD;  Location: [place];  Service: Vascular;  Laterality: Right;  . INSERTION OF DIALYSIS CATHETER Right 04/10/2016   Procedure: insertion Dialysis catheter;  Surgeon: Waynetta Sandy, MD;  Location: [place];  Service: Vascular;  Laterality: Right;  . INSERTION OF DIALYSIS CATHETER Right 04/13/2016   Procedure: INSERTION OF DIALYSIS CATHETER RIGHT INTERNAL JUGULAR;  Surgeon: Waynetta Sandy, MD;  Location: [place];  Service: Vascular;  Laterality: Right;  . Kidney Transplant x  2 months ago    . NO PAST SURGERIES    . REVISION OF ARTERIOVENOUS GORETEX GRAFT Right 01/18/2016   Procedure: REVISION OF ARTERIOVENOUS GORETEX GRAFT Right leg;  Surgeon: Rosetta Posner, MD;  Location: [place];  Service: Vascular;  Laterality: Right;    Social History:  reports that she has never smoked. She has never used  smokeless tobacco. She reports that she does not drink alcohol or use drugs.  Family History: Reviewed with patient, patient states that all family members are healthy  Prior to Admission medications   Medication Sig Start Date End Date Taking? Authorizing Provider  acetaminophen (TYLENOL) 500 MG tablet Take 500 mg by mouth every 6 (six) hours as needed for mild pain.   Yes [provider]  amLODipine (NORVASC) 10 MG tablet Take 10 mg by mouth daily.   Yes [provider]  carvedilol (COREG) 25 MG tablet Take 25 mg by mouth 2 (two) times daily with a meal.    Yes [provider]  ciprofloxacin (CIPRO) 500 MG tablet Take 500 mg by mouth 2 (two) times daily. 10/29/18 11/08/18 Yes [provider]  lisinopril (PRINIVIL,ZESTRIL) 40 MG tablet Take 40 mg by mouth at bedtime.   Yes [provider]  mycophenolate (MYFORTIC) 180 MG EC tablet Take 360 mg by mouth 2 (two) times daily. 10/09/18  Yes [provider]  omeprazole (PRILOSEC) 20 MG capsule Take 20 mg by mouth daily.   Yes [provider]  phosphorus (K PHOS NEUTRAL) 155-852-130 MG tablet Take 250 mg by mouth 2 (two) times daily.    Yes [provider]  predniSONE (DELTASONE) 5 MG tablet Take 5 mg by mouth daily. 08/23/18  Yes [provider]  sodium bicarbonate 650 MG tablet Take 650 mg by mouth 2 (two) times daily. 05/20/18  Yes [provider]  sulfamethoxazole-trimethoprim (BACTRIM,SEPTRA) 400-80 MG tablet Take 1 tablet by mouth 3 (three) times a week.   Yes [provider]  tacrolimus (PROGRAF) 1 MG capsule Take 5 mg by mouth 2 (two) times daily. 10/07/18  Yes [provider]  valGANciclovir (VALCYTE) 450 MG tablet Take 900 mg by mouth 2 (two) times daily. 09/25/18  Yes [provider]    Physical Exam: Vitals:   10/31/18 0230 10/31/18 0300 10/31/18 0345 10/31/18 0402  BP: 122/79 119/73 117/72   Pulse: (!) 103 99 90   Resp:       Temp:    98.6 F (37 C)  TempSrc:    Oral  SpO2: 98% 99% 98%   Weight:      Height:       General: Not in acute distress HEENT:       Eyes: PERRL, EOMI, no scleral icterus.       ENT: No discharge from the ears and nose, no pharynx injection, no tonsillar enlargement.        Neck: No JVD, no bruit, no mass felt. Heme: No neck lymph node enlargement. Cardiac: S1/S2, RRR, tachycardia, no murmurs, No gallops or rubs. Respiratory: No rales, wheezing, rhonchi or rubs. GI: Soft, nondistended, nontender, no rebound pain, no organomegaly, BS present. GU: No hematuria Ext: No pitting leg edema bilaterally. 2+DP/PT pulse bilaterally. Musculoskeletal: No joint deformities, No joint redness or warmth, no limitation of ROM in spin. Skin: No rashes.  Neuro: Alert, oriented X3, cranial nerves II-XII grossly intact, moves all extremities normally.  Psych: Patient is not psychotic, no suicidal or hemocidal ideation.  Labs on Admission:  I have personally reviewed following labs and imaging studies  CBC: Recent Labs  Lab 10/31/18 0109  WBC 3.5*  NEUTROABS 2.9  HGB 12.4  HCT 38.7  MCV 94.4  PLT 503   Basic Metabolic Panel: Recent Labs  Lab 10/31/18 0109  NA 133*  K 3.8  CL 107  CO2 17*  GLUCOSE 125*  BUN 17  CREATININE 1.07*  CALCIUM 9.6   GFR: Estimated Creatinine Clearance: 57 mL/min (A) (by C-G formula based on SCr of 1.07 mg/dL (H)). Liver Function Tests: Recent Labs  Lab 10/31/18 0109  AST 70*  ALT 153*  ALKPHOS 295*  BILITOT 0.7  PROT 6.7  ALBUMIN 3.5   No results for input(s): LIPASE, AMYLASE in the last 168 hours. No results for input(s): AMMONIA in the last 168 hours. Coagulation Profile: No results for input(s): INR, PROTIME in the last 168 hours. Cardiac Enzymes: No results for input(s): CKTOTAL, CKMB, CKMBINDEX, TROPONINI in the last 168 hours. BNP (last 3 results) No results for input(s): PROBNP in the last 8760 hours. HbA1C: No results for input(s):  HGBA1C in the last 72 hours. CBG: No results for input(s): GLUCAP in the last 168 hours. Lipid Profile: No results for input(s): CHOL, HDL, LDLCALC, TRIG, CHOLHDL, LDLDIRECT in the last 72 hours. Thyroid Function Tests: No results for input(s): TSH, T4TOTAL, FREET4, T3FREE, THYROIDAB in the last 72 hours. Anemia Panel: No results for input(s): VITAMINB12, FOLATE, FERRITIN, TIBC, IRON, RETICCTPCT in the last 72 hours. Urine analysis:    Component Value Date/Time   COLORURINE STRAW (A) 10/31/2018 0237   APPEARANCEUR CLEAR 10/31/2018 0237   LABSPEC 1.009 10/31/2018 0237   PHURINE 8.0 10/31/2018 0237   GLUCOSEU NEGATIVE 10/31/2018 0237   HGBUR LARGE (A) 10/31/2018 0237   BILIRUBINUR NEGATIVE 10/31/2018 0237   KETONESUR NEGATIVE 10/31/2018 0237   PROTEINUR NEGATIVE 10/31/2018 0237   UROBILINOGEN 0.2 12/09/2013 1556   NITRITE NEGATIVE 10/31/2018 0237   LEUKOCYTESUR NEGATIVE 10/31/2018 0237   Sepsis Labs: @LABRCNTIP (procalcitonin:4,lacticidven:4) )No results found for this or any previous visit (from the past 240 hour(s)).   Radiological Exams on Admission: Dg Chest Port 1 View  Result Date: 10/31/2018 CLINICAL DATA:  "bone pain" or muscle pain, and fever that began yesterday. Pt is recent kidney transplant from 6 months ago. Pt taking Cipro for recent UTI. EXAM: PORTABLE CHEST 1 VIEW COMPARISON:  04/13/2016 FINDINGS: Cardiac silhouette is borderline to mildly enlarged. No mediastinal or hilar masses. No evidence of adenopathy. Clear lungs.  No pleural effusion or pneumothorax. Skeletal structures are unremarkable. IMPRESSION: No active disease. Electronically Signed   By: Lajean Manes M.D.   On: 10/31/2018 00:59     EKG: Independently reviewed.  Sinus rhythm, tachycardia, QTC 395, LAE, low voltage.  Nonspecific T wave change.  Assessment/Plan Principal Problem:   Sepsis ([place]) Active Problems:   Anemia in chronic kidney disease   HTN (hypertension)   GERD (gastroesophageal  reflux disease)   Kidney transplanted   Abnormal LFTs   Sepsis [facility]): Patient meets criteria for sepsis with WBC 3.5, fever, tachycardia and tachypnea.  Source of infection is not clear.  Flu PCR negative.  Urinalysis negative.  Chest x-ray negative.  Patient has moderate to high risk of COVID19 (see below for risk assessment). COVID19 test was sent out by EDP.  - will admit to SDU as inpt - will add RVP, LDH, CRP - Empiric antimicrobial treatment with vancomycin and cefepime - Blood cultures x 2 and urine culture - will get Procalcitonin  and trend lactic acid levels per sepsis protocol. - IVF: 2.0 L of NS bolus in ED, followed by 100 cc/h  Anemia in chronic kidney disease: hgb 12.4 -f/u by CBC  HTN (hypertension): -hold amlodipine, lisinopril since patient at high risk of developing hypotension -Continue Coreg -IV hydralazine as needed  GERD (gastroesophageal reflux disease): -Protonix  Abnormal LFTs: ALP 295, AST 70, ALT 153, total bilirubin 0.7.  May be due to sepsis. -check hepatitis panel and HIV antibody -check tylenol level  Hx of  Kidney transplanted: Creatinine 1.07, BUN 17. GFR>60 -Continue mycophenolate, tacrolimus -Continue prednisone, but increased dose from 5 to 10 mg daily -Give 1 dose of Solu-Cortef 50 mg as stress dose.  Fever treatment: Since patient has history of kidney transplantation, will not use NSAIDs, such as ibuprofen.  Patient has abnormal liver function, Tylenol is not ideal choice, but has lower risk than ibuprofen.   -Will use Tylenol prn for fever ow and follow-up liver function closely. -check LFT again tomorrow   COVID subjective risk assessment:  moderate to high risk. Has fever and body aches. Has leukopenia (WBC 3.5), lymphopenia (lymphocyte 0.4) and abnormal liver function.  Physician PPE: face shield, N95, gown, glove Patient PPE: none. No coughing.    Fever: yes Cough: no  SOB: no URI symptoms: no GI symptoms: no Travel: no  Sick contacts: yes. Husband was sick recently CBC: has leukopenia (WBC 3.5), lymphopenia (lymphocyte 0.4) LFTs: increased AST/ALT CRP, LDH: pending Procalcitonin: now low CXR: negative COVID subjective risk assessment: moderated to high risk   Inpatient status:  # Patient requires inpatient status due to high intensity of service, high risk for further deterioration and high frequency of surveillance required.  I certify that at the point of admission it is my clinical judgment that the patient will require inpatient hospital care spanning beyond 2 midnights from the point of admission.  . This patient has multiple chronic comorbidities including ESRD s/p renal transplant in September 2019 at [facility], headaches, HTN, Fe+ deficiency anemia, hx of TBI, secondary hyperparathyroidism, anxiety, depression . Now patient has presenting with flulike symptoms and sepsis with unclear source of infection, need to rule out COVID19 . The worrisome physical exam findings include fever, tachycardia . The initial radiographic and laboratory data are worrisome because of abnormal liver function, WBC<4.0, sepsis, . Current medical needs: please see my assessment and plan . Predictability of an adverse outcome (risk): Patient has multiple comorbidities, including history of kidney transplantation on immunosuppressant.  Presents with sepsis with unclear etiology.  Patient has moderate to high risk of COVID19.  She is at high risk of deteriorating.  Will need to be treated in hospital for at least 2 days.   DVT ppx: SQ Heparin   Code Status: Full code Family Communication:  28 yo daughter at bed side, no adult family members at bedsidde Disposition Plan:  Anticipate discharge back to previous home environment Consults called:  none Admission status: SDU/inpation       Date of Service 10/31/2018    [place] [facility]   If 7PM-7AM, please contact night-coverage [email or web address] Password [facility]  10/31/2018, 4:54 AM

## 2018-10-31 NOTE — Progress Notes (Addendum)
Subjective:  Patient admitted this morning, see detailed H&P by Dr Blaine Hamper 33 year old female with a history of ESRD status post renal transplant in September 2019 at [facility], chronic headaches, hypertension, anemia, history of TBI, secondary hyperparathyroidism, anxiety, depression who came with fever/chills/body aches.  Vitals:   10/31/18 0845 10/31/18 1126  BP: 115/75 (!) 100/59  Pulse: 65 78  Resp:    Temp: (!) 97.2 F (36.2 C) 97.6 F (36.4 C)  SpO2: 100% 100%      A/P Sepsis Anemia chronic disease Hypertension GERD  Patient presented with sepsis with WBC 3.5, fever, tachycardia, tachypnea.  Unclear source of infection.  Influenza PCR negative.  Chest x-ray negative.  Patient is moderate to high risk of COVID 19. COVID 19 test sent out by EDP. Follow blood cultures, urine culture. Continue empiric vancomycin and cefepime.   [place] [facility] [phone]

## 2018-10-31 NOTE — Progress Notes (Addendum)
8:12am-CSW spoke with [facility]'s [facility]. CSW confirmed that she would be taking pt's daugher home to be with he 35 year old sister. NO further needs CSW will sign off.  CSW consulted as pt has a minor at bedside. CSW spoke with RN and was informed that godmother of child Basilia Jumbo would be to pick up pt's child in about 10 minutes. CSW will follow to ensure that child is picked up .     Virgie Dad Tanveer Brammer, MSW, [place] [facility] Clinical Social Worker [phone]

## 2018-10-31 NOTE — Progress Notes (Signed)
Pharmacy Antibiotic Note  Katie Lyons is a 33 y.o. female with h/o kidney transplant on immunosuppressants admitted on 10/31/2018 with fevers/sepsis.  Pharmacy has been consulted for Vancomycin and Cefepime  Dosing.  Vancomycin 1 g IV given in ED at  0519  Plan: Vancomycin 1 g IV q12h Cefepime 1 g IV q8h  Height: 4\' 11"  (149.9 cm) Weight: 128 lb (58.1 kg) IBW/kg (Calculated) : 43.2  Temp (24hrs), Avg:100.5 F (38.1 C), Min:98.6 F (37 C), Max:102.4 F (39.1 C)  Recent Labs  Lab 10/31/18 0109  WBC 3.5*  CREATININE 1.07*  LATICACIDVEN 0.8    Estimated Creatinine Clearance: 57 mL/min (A) (by C-G formula based on SCr of 1.07 mg/dL (H)).    No Known Allergies   Katie Lyons 10/31/2018 5:25 AM

## 2018-10-31 NOTE — Progress Notes (Signed)
Per conversation and chart review by Dr. Baxter Flattery patient placed on Droplet and Contact precautions, 1:1 high airborne  precautions discontinued
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
## 2018-10-31 NOTE — ED Notes (Signed)
ED TO INPATIENT HANDOFF REPORT  ED Nurse Name and Phone #: [phone]   S Name/Age/Gender Katie Lyons 33 y.o. female Room/Bed: 023C/023C  Code Status   Code Status: Full Code  Home/SNF/Other Home Patient oriented to: self, place, time and situation Is this baseline? Yes   Triage Complete: Triage complete  Chief Complaint bone aches  Triage Note Pt coming by POV with complaints of "bone pain" or muscle pain, and fever that began yesterday. Pt is recent kidney transplant from 6 months ago. Pt taking Cipro for recent UTI.    Allergies No Known Allergies  Level of Care/Admitting Diagnosis ED Disposition    ED Disposition Condition [facility]: [place] [[id number]]  Level of Care: Progressive [102]  Diagnosis: Sepsis [facility]) [[id number]]  Admitting Physician: Ivor Costa [[id number]]  Attending Physician: Ivor Costa [phone]  Estimated length of stay: past midnight tomorrow  Certification:: I certify this patient will need inpatient services for at least 2 midnights  PT Class (Do Not Modify): Inpatient [101]  PT Acc Code (Do Not Modify): Private [1]       B Medical/Surgery History Past Medical History:  Diagnosis Date  . Anxiety   . Chronic ischemic heart disease   . Depression    3-4 years ago per husband  . End stage renal disease ([place])   . Headache   . Hypertension   . Iron deficiency   . Secondary hyperparathyroidism ([place])   . Traumatic brain injury [facility])    Past Surgical History:  Procedure Laterality Date  . ARTERIOVENOUS GRAFT PLACEMENT    . AV FISTULA PLACEMENT Left 04/13/2016   Procedure: UPPER EXTREMITY LEFT  ARTERIOVENOUS (AV) FISTULA CREATION;  Surgeon: Waynetta Sandy, MD;  Location: [place];  Service: Vascular;  Laterality: Left;  . [place] REMOVAL Right 04/10/2016   Procedure: REMOVAL OF Right Leg ARTERIOVENOUS GORETEX GRAFT ([place]);  Surgeon: Waynetta Sandy, MD;  Location: [place];  Service:  Vascular;  Laterality: Right;  . INSERTION OF DIALYSIS CATHETER Right 04/10/2016   Procedure: insertion Dialysis catheter;  Surgeon: Waynetta Sandy, MD;  Location: [place];  Service: Vascular;  Laterality: Right;  . INSERTION OF DIALYSIS CATHETER Right 04/13/2016   Procedure: INSERTION OF DIALYSIS CATHETER RIGHT INTERNAL JUGULAR;  Surgeon: Waynetta Sandy, MD;  Location: [place];  Service: Vascular;  Laterality: Right;  . Kidney Transplant x 2 months ago    . NO PAST SURGERIES    . REVISION OF ARTERIOVENOUS GORETEX GRAFT Right 01/18/2016   Procedure: REVISION OF ARTERIOVENOUS GORETEX GRAFT Right leg;  Surgeon: Rosetta Posner, MD;  Location: [facility] OR;  Service: Vascular;  Laterality: Right;     A IV Location/Drains/Wounds Patient Lines/Drains/Airways Status   Active Line/Drains/Airways    Name:   Placement date:   Placement time:   Site:   Days:   Peripheral IV 10/31/18 Right Wrist   10/31/18    0049    Wrist   less than 1   Fistula / Graft Right Thigh Arteriovenous vein graft   01/18/16    0841    Thigh   1017   Fistula / Graft Left Upper arm Arteriovenous fistula   04/13/16    1437    Upper arm   931   Hemodialysis Catheter Right Internal jugular Double-lumen   04/13/16    1335    Internal jugular   931   Incision (Closed) 01/18/16 Leg Right   01/18/16  0813     1017   Incision (Closed) 04/10/16 Neck Right   04/10/16    1405     934   Incision (Closed) 04/10/16 Thigh Right   04/10/16    1632     934   Incision (Closed) 04/13/16 Neck Right   04/13/16    1408     931   Incision (Closed) 04/13/16 Arm Left   04/13/16    1439     931          Intake/Output Last 24 hours No intake or output data in the 24 hours ending 10/31/18 0724  Labs/Imaging Results for orders placed or performed during the hospital encounter of 10/31/18 (from the past 48 hour(s))  CBC with Differential     Status: Abnormal   Collection Time: 10/31/18  1:09 AM  Result Value Ref Range   WBC 3.5 (L) 4.0 -  10.5 K/uL   RBC 4.10 3.87 - 5.11 MIL/uL   Hemoglobin 12.4 12.0 - 15.0 g/dL   HCT 38.7 36.0 - 46.0 %   MCV 94.4 80.0 - 100.0 fL   MCH 30.2 26.0 - 34.0 pg   MCHC 32.0 30.0 - 36.0 g/dL   RDW 16.4 (H) 11.5 - 15.5 %   Platelets 199 150 - 400 K/uL   nRBC 0.0 0.0 - 0.2 %   Neutrophils Relative % 84 %   Neutro Abs 2.9 1.7 - 7.7 K/uL   Lymphocytes Relative 10 %   Lymphs Abs 0.4 (L) 0.7 - 4.0 K/uL   Monocytes Relative 4 %   Monocytes Absolute 0.1 0.1 - 1.0 K/uL   Eosinophils Relative 0 %   Eosinophils Absolute 0.0 0.0 - 0.5 K/uL   Basophils Relative 2 %   Basophils Absolute 0.1 0.0 - 0.1 K/uL   nRBC 1 (H) 0 /100 WBC   Abs Immature Granulocytes 0.00 0.00 - 0.07 K/uL    Comment: Performed at [facility], [address] [place], [place] [address]  Basic metabolic panel     Status: Abnormal   Collection Time: 10/31/18  1:09 AM  Result Value Ref Range   Sodium 133 (L) 135 - 145 mmol/L   Potassium 3.8 3.5 - 5.1 mmol/L   Chloride 107 98 - 111 mmol/L   CO2 17 (L) 22 - 32 mmol/L   Glucose, Bld 125 (H) 70 - 99 mg/dL   BUN 17 6 - 20 mg/dL   Creatinine, Ser 1.07 (H) 0.44 - 1.00 mg/dL   Calcium 9.6 8.9 - 10.3 mg/dL   GFR calc non Af Amer >60 >60 mL/min   GFR calc Af Amer >60 >60 mL/min   Anion gap 9 5 - 15    Comment: Performed at [facility], [place] [address] [place], [place] [address]  Lactic acid, plasma     Status: None   Collection Time: 10/31/18  1:09 AM  Result Value Ref Range   Lactic Acid, Venous 0.8 0.5 - 1.9 mmol/L    Comment: Performed at [facility] [address], [place], [place] [address]  Hepatic function panel     Status: Abnormal   Collection Time: 10/31/18  1:09 AM  Result Value Ref Range   Total Protein 6.7 6.5 - 8.1 g/dL   Albumin 3.5 3.5 - 5.0 g/dL   AST 70 (H) 15 - 41 U/L   ALT 153 (H) 0 - 44 U/L   Alkaline Phosphatase 295 (H) 38 - 126 U/L  Total Bilirubin 0.7 0.3 - 1.2 mg/dL   Bilirubin, Direct 0.2 0.0 - 0.2 mg/dL   Indirect Bilirubin 0.5  0.3 - 0.9 mg/dL    Comment: Performed at [facility], [place] [address], [place], [place] [address]  Procalcitonin     Status: None   Collection Time: 10/31/18  1:09 AM  Result Value Ref Range   Procalcitonin 0.33 ng/mL    Comment:        Interpretation: PCT (Procalcitonin) <= 0.5 ng/mL: Systemic infection (sepsis) is not likely. Local bacterial infection is possible. (NOTE)       Sepsis PCT Algorithm           Lower Respiratory Tract                                      Infection PCT Algorithm    ----------------------------     ----------------------------         PCT < 0.25 ng/mL                PCT < 0.10 ng/mL         Strongly encourage             Strongly discourage   discontinuation of antibiotics    initiation of antibiotics    ----------------------------     -----------------------------       PCT 0.25 - 0.50 ng/mL            PCT 0.10 - 0.25 ng/mL               OR       >80% decrease in PCT            Discourage initiation of                                            antibiotics      Encourage discontinuation           of antibiotics    ----------------------------     -----------------------------         PCT >= 0.50 ng/mL              PCT 0.26 - 0.50 ng/mL               AND        <80% decrease in PCT             Encourage initiation of                                             antibiotics       Encourage continuation           of antibiotics    ----------------------------     -----------------------------        PCT >= 0.50 ng/mL                  PCT > 0.50 ng/mL               AND         increase in PCT  Strongly encourage                                      initiation of antibiotics    Strongly encourage escalation           of antibiotics                                     -----------------------------                                           PCT <= 0.25 ng/mL                                                 OR                                         > 80% decrease in PCT                                     Discontinue / Do not initiate                                             antibiotics Performed at [facility], [place] [address], [place], [place] [address]   Influenza panel by PCR (type A & B)     Status: None   Collection Time: 10/31/18  1:57 AM  Result Value Ref Range   Influenza A By PCR NEGATIVE NEGATIVE   Influenza B By PCR NEGATIVE NEGATIVE    Comment: (NOTE) The Xpert Xpress Flu assay is intended as an aid in the diagnosis of  influenza and should not be used as a sole basis for treatment.  This  assay is FDA approved for nasopharyngeal swab specimens only. Nasal  washings and aspirates are unacceptable for Xpert Xpress Flu testing. Performed at [facility], [place] [address], [place], [place] [address]   Urinalysis, Routine w reflex microscopic     Status: Abnormal   Collection Time: 10/31/18  2:37 AM  Result Value Ref Range   Color, Urine STRAW (A) YELLOW   APPearance CLEAR CLEAR   Specific Gravity, Urine 1.009 1.005 - 1.030   pH 8.0 5.0 - 8.0   Glucose, UA NEGATIVE NEGATIVE mg/dL   Hgb urine dipstick LARGE (A) NEGATIVE   Bilirubin Urine NEGATIVE NEGATIVE   Ketones, ur NEGATIVE NEGATIVE mg/dL   Protein, ur NEGATIVE NEGATIVE mg/dL   Nitrite NEGATIVE NEGATIVE   Leukocytes,Ua NEGATIVE NEGATIVE   RBC / HPF 21-50 0 - 5 RBC/hpf   WBC, UA 0-5 0 - 5 WBC/hpf   Bacteria, UA NONE SEEN NONE SEEN   Squamous Epithelial / LPF 0-5 0 - 5    Comment: Performed at [facility], [place] [address],  [place], [place] [address]  Lactic acid, plasma     Status: None   Collection Time: 10/31/18  5:08 AM  Result Value Ref Range   Lactic Acid, Venous 0.7 0.5 - 1.9 mmol/L    Comment: Performed at [facility] [address], [place], [place] [address]  Basic metabolic panel     Status: Abnormal   Collection Time: 10/31/18  5:08 AM  Result Value Ref Range   Sodium 136 135 - 145 mmol/L   Potassium  4.4 3.5 - 5.1 mmol/L   Chloride 113 (H) 98 - 111 mmol/L   CO2 16 (L) 22 - 32 mmol/L   Glucose, Bld 130 (H) 70 - 99 mg/dL   BUN 14 6 - 20 mg/dL   Creatinine, Ser 1.08 (H) 0.44 - 1.00 mg/dL   Calcium 8.9 8.9 - 10.3 mg/dL   GFR calc non Af Amer >60 >60 mL/min   GFR calc Af Amer >60 >60 mL/min   Anion gap 7 5 - 15    Comment: Performed at [facility], [place] [address] [place], [place] [address]  CBC     Status: Abnormal   Collection Time: 10/31/18  5:08 AM  Result Value Ref Range   WBC 3.6 (L) 4.0 - 10.5 K/uL   RBC 3.83 (L) 3.87 - 5.11 MIL/uL   Hemoglobin 11.4 (L) 12.0 - 15.0 g/dL   HCT 37.2 36.0 - 46.0 %   MCV 97.1 80.0 - 100.0 fL   MCH 29.8 26.0 - 34.0 pg   MCHC 30.6 30.0 - 36.0 g/dL   RDW 16.6 (H) 11.5 - 15.5 %   Platelets 180 150 - 400 K/uL   nRBC 0.0 0.0 - 0.2 %    Comment: Performed at [facility], [place] [address] [place], [place] [address]  C-reactive protein     Status: Abnormal   Collection Time: 10/31/18  5:08 AM  Result Value Ref Range   CRP 8.4 (H) <1.0 mg/dL    Comment: Performed at [place] [address], [place], [place] [address]  Lactate dehydrogenase     Status: Abnormal   Collection Time: 10/31/18  5:08 AM  Result Value Ref Range   LDH 246 (H) 98 - 192 U/L    Comment: Performed at [facility], [place] [address] [place], [place] [address]  Acetaminophen level     Status: None   Collection Time: 10/31/18  5:08 AM  Result Value Ref Range   Acetaminophen (Tylenol), Serum 12 10 - 30 ug/mL    Comment: (NOTE) Therapeutic concentrations vary significantly. A range of 10-30 ug/mL  may be an effective concentration for many patients. However, some  are best treated at concentrations outside of this range. Acetaminophen concentrations >150 ug/mL at 4 hours after ingestion  and >50 ug/mL at 12 hours after ingestion are often associated with  toxic reactions. Performed at [facility], [place] [address] [place], [place] [address]     Dg Chest Port 1 View  Result Date: 10/31/2018 CLINICAL DATA:  "bone pain" or muscle pain, and fever that began yesterday. Pt is recent kidney transplant from 6 months ago. Pt taking Cipro for recent UTI. EXAM: PORTABLE CHEST 1 VIEW COMPARISON:  04/13/2016 FINDINGS: Cardiac silhouette is borderline to mildly enlarged. No mediastinal or hilar masses. No evidence of adenopathy. Clear lungs.  No pleural effusion or pneumothorax. Skeletal structures are unremarkable. IMPRESSION: No active disease. Electronically Signed   By: Shanon Brow  Ormond M.D.   On: 10/31/2018 00:59    Pending Labs Unresulted Labs (From admission, onward)    Start     Ordered   11/01/18 0503  Hepatic function panel  Once,   R     10/31/18 0503   10/31/18 0425  Hepatitis panel, acute  Once,   R     10/31/18 0424   10/31/18 0425  HIV Antibody (routine testing w rflx)  Once,   R     10/31/18 0424   10/31/18 0332  Respiratory Panel by PCR  (Respiratory virus panel with precautions)  Once,   R     10/31/18 0331   10/31/18 0248  Novel Coronavirus, NAA (hospital order; send-out to ref lab)  (Novel Coronavirus, NAA [facility] Order; send-out to ref lab) with precautions panel)  ONCE - STAT,   R     10/31/18 0248   10/31/18 0040  Urine culture  ONCE - STAT,   STAT     10/31/18 0041   10/31/18 0040  Blood culture (routine x 2)  BLOOD CULTURE X 2,   STAT     10/31/18 0041          Vitals/Pain Today's Vitals   10/31/18 0500 10/31/18 0530 10/31/18 0545 10/31/18 0600  BP: 117/71 115/71 117/81 103/68  Pulse: 86 88 89 97  Resp:  (!) 21 19   Temp:      TempSrc:      SpO2: 100% 100% 100% 100%  Weight:      Height:      PainSc:        Isolation Precautions Airborne precautions  Medications Medications  0.9 %  sodium chloride infusion ( Intravenous New Bag/Given 10/31/18 0524)  sulfamethoxazole-trimethoprim (BACTRIM,SEPTRA) 400-80 MG per tablet 1 tablet (has no administration in time range)  carvedilol (COREG) tablet 25 mg  (has no administration in time range)  predniSONE (DELTASONE) tablet 10 mg (has no administration in time range)  pantoprazole (PROTONIX) EC tablet 40 mg (has no administration in time range)  sodium bicarbonate tablet 650 mg (has no administration in time range)  tacrolimus (PROGRAF) capsule 5 mg (has no administration in time range)  phosphorus (K PHOS NEUTRAL) tablet 250 mg (has no administration in time range)  mycophenolate (MYFORTIC) EC tablet 360 mg (has no administration in time range)  heparin injection 5,000 Units (has no administration in time range)  acetaminophen (TYLENOL) tablet 650 mg (has no administration in time range)    Or  acetaminophen (TYLENOL) suppository 650 mg (has no administration in time range)  ondansetron (ZOFRAN) tablet 4 mg (has no administration in time range)    Or  ondansetron (ZOFRAN) injection 4 mg (has no administration in time range)  valGANciclovir (VALCYTE) 450 MG tablet TABS 900 mg (has no administration in time range)  hydrALAZINE (APRESOLINE) injection 5 mg (has no administration in time range)  vancomycin (VANCOCIN) IVPB 1000 mg/200 mL premix (has no administration in time range)  ceFEPIme (MAXIPIME) 1 g in sodium chloride 0.9 % 100 mL IVPB (has no administration in time range)  diphenhydrAMINE (BENADRYL) capsule 25 mg (has no administration in time range)  diphenhydrAMINE (BENADRYL) 50 MG/ML injection (has no administration in time range)  sodium chloride 0.9 % bolus 1,000 mL (0 mLs Intravenous Stopped 10/31/18 0200)  acetaminophen (TYLENOL) tablet 650 mg (650 mg Oral Given 10/31/18 0050)  morphine 4 MG/ML injection (  Given by Other 10/31/18 0120)  sodium chloride 0.9 % bolus 1,000 mL (0 mLs Intravenous  Stopped 10/31/18 0330)  vancomycin (VANCOCIN) IVPB 1000 mg/200 mL premix (0 mg Intravenous Stopped 10/31/18 0619)  ceFEPIme (MAXIPIME) 2 g in sodium chloride 0.9 % 100 mL IVPB (0 g Intravenous Stopped 10/31/18 0430)  hydrocortisone sodium succinate  (SOLU-CORTEF) 100 MG injection 50 mg (50 mg Intravenous Given 10/31/18 0519)  diphenhydrAMINE (BENADRYL) injection 12.5 mg (12.5 mg Intravenous Given 10/31/18 7011)    Mobility walks Low fall risk   Focused Assessments Pulmonary Assessment Handoff:  Lung sounds:            R Recommendations: See Admitting Provider Note  Report given to:   Additional Notes: N/A

## 2018-11-01 LAB — BASIC METABOLIC PANEL
Anion gap: 4 — ABNORMAL LOW (ref 5–15)
BUN: 15 mg/dL (ref 6–20)
CO2: 19 mmol/L — ABNORMAL LOW (ref 22–32)
Calcium: 9.4 mg/dL (ref 8.9–10.3)
Chloride: 115 mmol/L — ABNORMAL HIGH (ref 98–111)
Creatinine, Ser: 0.92 mg/dL (ref 0.44–1.00)
GFR calc Af Amer: >60 mL/min (ref >60)
GFR calc non Af Amer: >60 mL/min (ref >60)
Glucose, Bld: 127 mg/dL — ABNORMAL HIGH (ref 70–99)
Potassium: 3.8 mmol/L (ref 3.5–5.1)
Sodium: 138 mmol/L (ref 135–145)

## 2018-11-01 LAB — HEPATITIS PANEL, ACUTE
HCV Ab: <0.1 s/co ratio (ref 0.0–0.9)
Hep A IgM: NEGATIVE
Hep B C IgM: NEGATIVE
Hepatitis B Surface Ag: NEGATIVE

## 2018-11-01 LAB — HEPATIC FUNCTION PANEL
ALBUMIN: 2.8 g/dL — AB (ref 3.5–5.0)
ALK PHOS: 211 U/L — AB (ref 38–126)
ALT: 98 U/L — ABNORMAL HIGH (ref 0–44)
AST: 34 U/L (ref 15–41)
Bilirubin, Direct: 0.1 mg/dL (ref 0.0–0.2)
Indirect Bilirubin: 0.5 mg/dL (ref 0.3–0.9)
Total Bilirubin: 0.6 mg/dL (ref 0.3–1.2)
Total Protein: 5.8 g/dL — ABNORMAL LOW (ref 6.5–8.1)

## 2018-11-01 LAB — CBC
HCT: 35.9 % — ABNORMAL LOW (ref 36.0–46.0)
Hemoglobin: 11.4 g/dL — ABNORMAL LOW (ref 12.0–15.0)
MCH: 30.3 pg (ref 26.0–34.0)
MCHC: 31.8 g/dL (ref 30.0–36.0)
MCV: 95.5 fL (ref 80.0–100.0)
Platelets: 190 10*3/uL (ref 150–400)
RBC: 3.76 MIL/uL — ABNORMAL LOW (ref 3.87–5.11)
RDW: 16.6 % — ABNORMAL HIGH (ref 11.5–15.5)
WBC: 3.5 10*3/uL — ABNORMAL LOW (ref 4.0–10.5)
nRBC: 0 % (ref 0.0–0.2)

## 2018-11-01 LAB — URINE CULTURE: CULTURE: NO GROWTH

## 2018-11-01 NOTE — Progress Notes (Signed)
[facility]  PROGRESS NOTE  Katie Lyons MCR:[id number] DOB: 1986-05-25 DOA: 10/31/2018 PCP: Fleet Contras, MD   Brief HPI:   33 year old female with a history of ESRD status post renal transplant in September 2019 and [facility], chronic headaches, hypertension, anemia, history of TBI, secondary hyperparathyroidism, anxiety, depression came to hospital with fever/chills/body aches.  Influenza PCR was negative.  Code B19 virus was tested and result is currently pending.    Subjective   Patient denies any body aches, no chest pain or shortness of breath.  She is currently afebrile.  RVP positive for rhinovirus.   Assessment/Plan:     1. Sepsis-resolved patient presented with sepsis criteria with tachycardia, tachypnea, fever, WBC 3.5.  RVP positive for rhinovirus.  Influenza PCR negative.  Chest x-ray negative.  UA negative.  Covered 19 test was collected in the ED.  Result is currently pending.  Vancomycin has been discontinued.  Continue with cefepime, follow blood culture and urine culture.  2. Hypertension-amlodipine, lisinopril is on hold.  Blood pressure is stable.  Continue Coreg.  3. Transaminitis-likely from viral illness, alk phos 25, AST 70, ALT 153, total bili 0.7.  Hepatitis panel and HIV antibody are negative.  Tylenol level is 12, within normal limits.    4. H/o renal transplant- BUN 15, Cr 0.92. Continue mycophenolate, tacrolimus. Continue Bactrim, valacyclovir.  5. COVID19- patient is moderate to high risk, came with fever. She is currently afebrile, COVID 19 test is currently pending.      CBC: Recent Labs  Lab 10/31/18 0109 10/31/18 0508 11/01/18 0411  WBC 3.5* 3.6* 3.5*  NEUTROABS 2.9  --   --   HGB 12.4 11.4* 11.4*  HCT 38.7 37.2 35.9*  MCV 94.4 97.1 95.5  PLT 199 180 034    Basic Metabolic Panel: Recent Labs  Lab 10/31/18 0109 10/31/18 0508 11/01/18 0411  NA 133* 136 138  K 3.8 4.4 3.8  CL 107 113* 115*  CO2 17* 16* 19*   GLUCOSE 125* 130* 127*  BUN _0 CREATININE 1.07* 1.08* 0.92  CALCIUM 9.6 8.9 9.4     DVT prophylaxis: Heparin  Code Status: Full code  Family Communication: No family at bedside  Disposition Plan: likely home when medically ready for discharge     Consultants:    Procedures:   Antibiotics:   Anti-infectives (From admission, onward)   Start     Dose/Rate Route Frequency Ordered Stop   11/01/18 1000  sulfamethoxazole-trimethoprim (BACTRIM,SEPTRA) 400-80 MG per tablet 1 tablet     1 tablet Oral Once per day on Mon Wed Fri 10/31/18 0418     11/01/18 0600  vancomycin (VANCOCIN) 1,000 mg in sodium chloride 0.9 % 250 mL IVPB     1,000 mg 250 mL/hr over 60 Minutes Intravenous Every 12 hours 10/31/18 1751     10/31/18 1800  vancomycin (VANCOCIN) IVPB 1000 mg/200 mL premix  Status:  Discontinued     1,000 mg 200 mL/hr over 60 Minutes Intravenous Every 12 hours 10/31/18 0528 10/31/18 1751   10/31/18 1400  ceFEPIme (MAXIPIME) 1 g in sodium chloride 0.9 % 100 mL IVPB     1 g 200 mL/hr over 30 Minutes Intravenous Every 8 hours 10/31/18 0528     10/31/18 1000  valGANciclovir (VALCYTE) 450 MG tablet TABS 900 mg     900 mg Oral 2 times daily 10/31/18 0437     10/31/18 0230  vancomycin (VANCOCIN) IVPB 1000 mg/200 mL premix     1,000  mg 200 mL/hr over 60 Minutes Intravenous  Once 10/31/18 0215 10/31/18 0619   10/31/18 0230  ceFEPIme (MAXIPIME) 2 g in sodium chloride 0.9 % 100 mL IVPB     2 g 200 mL/hr over 30 Minutes Intravenous  Once 10/31/18 0215 10/31/18 0430       Objective   Vitals:   10/31/18 1826 10/31/18 1946 11/01/18 0515 11/01/18 1435  BP: 113/74 116/72 115/70 (!) 94/58  Pulse: 76 69 78 79  Resp: (!) 25 (!) 27 (!) 24   Temp: 97.8 F (36.6 C) (!) 97.5 F (36.4 C) 97.9 F (36.6 C) (!) 97.1 F (36.2 C)  TempSrc: Oral Oral Oral Oral  SpO2: 100% 100% 100% 100%  Weight:   58.2 kg   Height:   _0  (1.499 m)    No intake or output data in the 24 hours  ending 11/01/18 1523 Filed Weights   10/31/18 0037 11/01/18 0515  Weight: 58.1 kg 58.2 kg     Physical Examination:    General: Appears in no acute distress  Cardiovascular: S1-S2, regular  Respiratory: Clear to auscultation bilaterally  Abdomen: Abdomen is soft, nontender, no organomegaly  Extremities: No edema of the lower extremities  Neurologic: Alert, oriented x3, no focal deficit noted.     Data Reviewed: I have personally reviewed following labs and imaging studies   Recent Results (from the past 240 hour(s))  Urine culture     Status: None   Collection Time: 10/31/18 12:40 AM  Result Value Ref Range Status   Specimen Description URINE, RANDOM  Final   Special Requests NONE  Final   Culture   Final    NO GROWTH Performed at [facility], [address] [place], [place] [address]    Report Status 11/01/2018 FINAL  Final  Blood culture (routine x 2)     Status: None (Preliminary result)   Collection Time: 10/31/18  1:05 AM  Result Value Ref Range Status   Specimen Description BLOOD RIGHT ANTECUBITAL  Final   Special Requests   Final    BOTTLES DRAWN AEROBIC AND ANAEROBIC Blood Culture results may not be optimal due to an excessive volume of blood received in culture bottles   Culture   Final    NO GROWTH 1 DAY Performed at [facility], [place] [address] [place], [place] [address]    Report Status PENDING  Incomplete  Blood culture (routine x 2)     Status: None (Preliminary result)   Collection Time: 10/31/18  1:13 AM  Result Value Ref Range Status   Specimen Description BLOOD RIGHT HAND  Final   Special Requests   Final    BOTTLES DRAWN AEROBIC AND ANAEROBIC Blood Culture results may not be optimal due to an excessive volume of blood received in culture bottles   Culture   Final    NO GROWTH 1 DAY Performed at [facility], [place] [address], [place], [place] [address]    Report Status PENDING  Incomplete  Respiratory Panel by PCR      Status: Abnormal   Collection Time: 10/31/18  3:32 AM  Result Value Ref Range Status   Adenovirus NOT DETECTED NOT DETECTED Final   Coronavirus 229E NOT DETECTED NOT DETECTED Final    Comment: (NOTE) The Coronavirus on the Respiratory Panel, DOES NOT test for the novel  Coronavirus (2019 nCoV)    Coronavirus HKU1 NOT DETECTED NOT DETECTED Final   Coronavirus NL63 NOT DETECTED NOT  DETECTED Final   Coronavirus OC43 NOT DETECTED NOT DETECTED Final   Metapneumovirus NOT DETECTED NOT DETECTED Final   Rhinovirus / Enterovirus DETECTED (A) NOT DETECTED Final   Influenza A NOT DETECTED NOT DETECTED Final   Influenza B NOT DETECTED NOT DETECTED Final   Parainfluenza Virus 1 NOT DETECTED NOT DETECTED Final   Parainfluenza Virus 2 NOT DETECTED NOT DETECTED Final   Parainfluenza Virus 3 NOT DETECTED NOT DETECTED Final   Parainfluenza Virus 4 NOT DETECTED NOT DETECTED Final   Respiratory Syncytial Virus NOT DETECTED NOT DETECTED Final   Bordetella pertussis NOT DETECTED NOT DETECTED Final   Chlamydophila pneumoniae NOT DETECTED NOT DETECTED Final   Mycoplasma pneumoniae NOT DETECTED NOT DETECTED Final    Comment: Performed at [facility], [place] [address] [place], [place] [address]     Liver Function Tests: Recent Labs  Lab 10/31/18 0109 11/01/18 0411  AST 70* 34  ALT 153* 98*  ALKPHOS 295* 211*  BILITOT 0.7 0.6  PROT 6.7 5.8*  ALBUMIN 3.5 2.8*   No results for input(s): LIPASE, AMYLASE in the last 168 hours. No results for input(s): AMMONIA in the last 168 hours.  Cardiac Enzymes: No results for input(s): CKTOTAL, CKMB, CKMBINDEX, TROPONINI in the last 168 hours. BNP (last 3 results) No results for input(s): BNP in the last 8760 hours.  ProBNP (last 3 results) No results for input(s): PROBNP in the last 8760 hours.    Studies: Dg Chest Port 1 View  Result Date: 10/31/2018 CLINICAL DATA:  "bone pain" or muscle pain, and fever that began yesterday. Pt is recent kidney  transplant from 6 months ago. Pt taking Cipro for recent UTI. EXAM: PORTABLE CHEST 1 VIEW COMPARISON:  04/13/2016 FINDINGS: Cardiac silhouette is borderline to mildly enlarged. No mediastinal or hilar masses. No evidence of adenopathy. Clear lungs.  No pleural effusion or pneumothorax. Skeletal structures are unremarkable. IMPRESSION: No active disease. Electronically Signed   By: Lajean Manes M.D.   On: 10/31/2018 00:59    Scheduled Meds: . carvedilol  25 mg Oral BID WC  . heparin  5,000 Units Subcutaneous Q8H  . mycophenolate  360 mg Oral BID  . pantoprazole  40 mg Oral Daily  . phosphorus  250 mg Oral BID  . predniSONE  10 mg Oral Q breakfast  . sodium bicarbonate  650 mg Oral BID  . sulfamethoxazole-trimethoprim  1 tablet Oral Once per day on Mon Wed Fri  . tacrolimus  5 mg Oral BID  . valGANciclovir  900 mg Oral BID    Admission status: Inpatient: Based on patients clinical presentation and evaluation of above clinical data, I have made determination that patient meets Inpatient criteria at this time.  Time spent: 20 min  [facility] Pager [phone]. If 7PM-7AM, please contact night-coverage at [email or web address], Office  [phone]  password [email or web address]  11/01/2018, 3:23 PM  LOS: 1 day

## 2018-11-02 DIAGNOSIS — Z940 Kidney transplant status: Secondary | ICD-10-CM

## 2018-11-02 DIAGNOSIS — D72819 Decreased white blood cell count, unspecified: Secondary | ICD-10-CM

## 2018-11-02 DIAGNOSIS — B348 Other viral infections of unspecified site: Secondary | ICD-10-CM

## 2018-11-02 DIAGNOSIS — R740 Nonspecific elevation of levels of transaminase and lactic acid dehydrogenase [LDH]: Secondary | ICD-10-CM

## 2018-11-02 DIAGNOSIS — R509 Fever, unspecified: Secondary | ICD-10-CM

## 2018-11-02 DIAGNOSIS — R5383 Other fatigue: Secondary | ICD-10-CM

## 2018-11-02 DIAGNOSIS — R5381 Other malaise: Secondary | ICD-10-CM

## 2018-11-02 DIAGNOSIS — R300 Dysuria: Secondary | ICD-10-CM

## 2018-11-02 MED ORDER — AMLODIPINE BESYLATE 2.5 MG PO TABS: 2.5000 mg | ORAL_TABLET | Freq: Every day | ORAL | 0 refills | Status: DC

## 2018-11-02 MED ORDER — AMLODIPINE BESYLATE 2.5 MG PO TABS: 10.0000 mg | ORAL_TABLET | Freq: Every day | ORAL | 0 refills | Status: DC

## 2018-11-02 MED ORDER — LISINOPRIL 10 MG PO TABS: 10.0000 mg | ORAL_TABLET | Freq: Every day | ORAL | 0 refills | Status: DC

## 2018-11-02 NOTE — Plan of Care (Signed)
  Problem: Fluid Volume: Goal: Hemodynamic stability will improve Outcome: Progressing   Problem: Clinical Measurements: Goal: Signs and symptoms of infection will decrease Outcome: Progressing   Problem: Clinical Measurements: Goal: Ability to maintain clinical measurements within normal limits will improve Outcome: Progressing

## 2018-11-02 NOTE — Consult Note (Signed)
Date of Admission:  10/31/2018          Reason for Consult: Rule out novel coronavirus   Referring Provider: Dr Marthenia Rolling   Assessment:  1. Possible urinary tract infection with E. Coli 2. Rhinovirus colonization versus infection 3. Rule out novel coronavirus 2019 infection 4. Status post renal transplant 6 months ago  Plan:  1. DC antibacterial antibiotics  2. WHILE in the hospital continue DROPLET/CONTACT PRECAUTIONS ONLY (NO NEED FOR AIRBORNE ISOLATION OR N95 masks) 3. OK to DC to home BUT WITH: 4. Explicit instructions gone over with the patient using Spanish translator re [employer] [facility] and [employer]  Infection Prevention Recommendations for Individuals Confirmed to have,or Being Evaluated for, 2019 Novel Coronavirus (COVID-19) Infection WhoReceive Care at Home 5. I would also give patient the Spanish instructions that I located from the [place] [facility]  Principal Problem:   Sepsis ([place]) Active Problems:   Anemia in chronic kidney disease   HTN (hypertension)   GERD (gastroesophageal reflux disease)   Kidney transplanted   Abnormal LFTs   Scheduled Meds: . carvedilol  25 mg Oral BID WC  . heparin  5,000 Units Subcutaneous Q8H  . mycophenolate  360 mg Oral BID  . pantoprazole  40 mg Oral Daily  . phosphorus  250 mg Oral BID  . predniSONE  10 mg Oral Q breakfast  . sodium bicarbonate  650 mg Oral BID  . sulfamethoxazole-trimethoprim  1 tablet Oral Once per day on Mon Wed Fri  . tacrolimus  5 mg Oral BID  . valGANciclovir  900 mg Oral BID   Continuous Infusions: . sodium chloride 100 mL/hr at 11/01/18 1307  . sodium chloride Stopped (10/31/18 1842)  . ceFEPime (MAXIPIME) IV 1 g (11/02/18 0523)   PRN Meds:.sodium chloride, acetaminophen **OR** acetaminophen, diphenhydrAMINE, hydrALAZINE, ondansetron **OR** ondansetron (ZOFRAN) IV  HPI: Katie Lyons is a 33 y.o. female status post renal transplantation at [facility] roughly 6 months ago and is still followed at [facility].  She was seen there earlier in March and was having some trouble with elevation in her transaminases.  She then developed some dysuria but according to the notes no fever or systemic symptoms.  She dropped off a urine sample at the clinic in [facility] which was sent for analysis and had 159 white blood cells and 71 red cells.  Culture was sent from the sample grew a very sensitive E. coli with a colony count between 50 and 100,000.  In the interim the patient had began to develop severe fevers chills and diffuse intense body aches.  She was given ciprofloxacin and taken this for at least 2 days but was having progressive worsening of her fevers chills and myalgias.  In the ER she was febrile tachycardic and tachypneic.  She did not have any pulmonary complaints or cough.  Chest x-ray was clear.  Her labs were significant for leukopenia as well as elevation in her transaminases.  The latter has improved during the course of her stay.  Urine analysis and culture were obtained as were blood cultures have all been negative.  Her influenza PCR was negative as well.  Her respiratory viral panel came back positive for rhinovirus.  She has been given vancomycin and cefepime initially then narrowed to cefepime alone and now is had already nearly 4 days of cefepime in the hospital in addition to the 2 days of ciprofloxacin.  Her  fever defervesced rapidly from admission to the next day.  On exam today she is quite pleasant and in good spirits and very stable appearing.  She is very anxious to go home.  I have told her that if she does go home because we are still considering the fact that she may have a novel coronavirus 2019 infection that she will need to go home under very strict conditions and these will need to be gone over with her using a Patent attorney.  I spent greater than 80 minutes with the patient including greater  than 50% of time in face to face counsel of the patient in the work-up of her fevers and myalgias in the differential diagnosis of the cause of this, the nature of infections in immunocompromised patients including our concern for possible novel coronavirus 2019 infection and in coordination of her care.     Review of Systems: Review of Systems  Constitutional: Positive for chills, diaphoresis, fever and malaise/fatigue. Negative for weight loss.  HENT: Negative for congestion and sore throat.   Eyes: Negative for blurred vision and photophobia.  Respiratory: Negative for cough, shortness of breath and wheezing.   Cardiovascular: Negative for chest pain, palpitations and leg swelling.  Gastrointestinal: Negative for abdominal pain, blood in stool, constipation, diarrhea, heartburn, melena, nausea and vomiting.  Genitourinary: Positive for dysuria. Negative for flank pain and hematuria.  Musculoskeletal: Positive for myalgias. Negative for back pain, falls and joint pain.  Skin: Negative for itching and rash.  Neurological: Negative for dizziness, focal weakness, loss of consciousness, weakness and headaches.  Endo/Heme/Allergies: Does not bruise/bleed easily.  Psychiatric/Behavioral: Negative for depression and suicidal ideas. The patient does not have insomnia.     Past Medical History:  Diagnosis Date  . Anxiety   . Chronic ischemic heart disease   . Depression    3-4 years ago per husband  . End stage renal disease ([place])   . Headache   . Hypertension   . Iron deficiency   . Secondary hyperparathyroidism ([place])   . Traumatic brain injury [facility])     Social History   Tobacco Use  . Smoking status: Never Smoker  . Smokeless tobacco: Never Used  Substance Use Topics  . Alcohol use: No  . Drug use: No    History reviewed. No pertinent family history. No Known Allergies  OBJECTIVE: Blood pressure 116/74, pulse 71, temperature 97.8 F (36.6 C), temperature source Oral,  resp. rate 17, height 4\' 11"  (1.499 m), weight 58.2 kg, last menstrual period 10/31/2018, SpO2 100 %.  Physical Exam Constitutional:      General: She is not in acute distress.    Appearance: Normal appearance. She is well-developed. She is not ill-appearing or diaphoretic.  HENT:     Head: Normocephalic and atraumatic.     Right Ear: Hearing and external ear normal.     Left Ear: Hearing and external ear normal.     Nose: Nose normal. No nasal deformity or rhinorrhea.  Eyes:     General: No scleral icterus.    Extraocular Movements: Extraocular movements intact.     Conjunctiva/sclera: Conjunctivae normal.     Right eye: Right conjunctiva is not injected.     Left eye: Left conjunctiva is not injected.  Neck:     Musculoskeletal: Normal range of motion and neck supple.     Vascular: No JVD.  Cardiovascular:     Rate and Rhythm: Normal rate and regular rhythm.     Heart sounds:  Normal heart sounds, S1 normal and S2 normal. No murmur. No friction rub.  Pulmonary:     Effort: Pulmonary effort is normal. No respiratory distress.     Breath sounds: Normal breath sounds. No stridor. No wheezing, rhonchi or rales.  Chest:     Chest wall: No tenderness.  Abdominal:     General: Abdomen is flat. Bowel sounds are normal. There is no distension.     Palpations: Abdomen is soft. There is no mass.     Tenderness: There is no abdominal tenderness. There is no right CVA tenderness or left CVA tenderness.     Hernia: No hernia is present.  Musculoskeletal: Normal range of motion.     Right shoulder: Normal.     Left shoulder: Normal.     Right hip: Normal.     Left hip: Normal.     Right knee: Normal.     Left knee: Normal.  Lymphadenopathy:     Head:     Right side of head: No submandibular, preauricular or posterior auricular adenopathy.     Left side of head: No submandibular, preauricular or posterior auricular adenopathy.     Cervical: No cervical adenopathy.     Right cervical: No  superficial or deep cervical adenopathy.    Left cervical: No superficial or deep cervical adenopathy.  Skin:    General: Skin is warm and dry.     Coloration: Skin is not pale.     Findings: No abrasion, bruising, ecchymosis, erythema, lesion or rash.     Nails: There is no clubbing.   Neurological:     General: No focal deficit present.     Mental Status: She is alert and oriented to person, place, and time.     Sensory: No sensory deficit.     Motor: No weakness.     Coordination: Coordination normal.     Gait: Gait normal.  Psychiatric:        Attention and Perception: She is attentive.        Mood and Affect: Mood normal.        Speech: Speech normal.        Behavior: Behavior normal. Behavior is cooperative.        Thought Content: Thought content normal.        Judgment: Judgment normal.     Lab Results Lab Results  Component Value Date   WBC 3.5 (L) 11/01/2018   HGB 11.4 (L) 11/01/2018   HCT 35.9 (L) 11/01/2018   MCV 95.5 11/01/2018   PLT 190 11/01/2018    Lab Results  Component Value Date   CREATININE 0.92 11/01/2018   BUN 15 11/01/2018   NA 138 11/01/2018   K 3.8 11/01/2018   CL 115 (H) 11/01/2018   CO2 19 (L) 11/01/2018    Lab Results  Component Value Date   ALT 98 (H) 11/01/2018   AST 34 11/01/2018   ALKPHOS 211 (H) 11/01/2018   BILITOT 0.6 11/01/2018     Microbiology: Recent Results (from the past 240 hour(s))  Urine culture     Status: None   Collection Time: 10/31/18 12:40 AM  Result Value Ref Range Status   Specimen Description URINE, RANDOM  Final   Special Requests NONE  Final   Culture   Final    NO GROWTH Performed at [facility], [address] [place], [place] [address]    Report Status 11/01/2018 FINAL  Final  Blood culture (routine x 2)  Status: None (Preliminary result)   Collection Time: 10/31/18  1:05 AM  Result Value Ref Range Status   Specimen Description BLOOD RIGHT ANTECUBITAL  Final   Special Requests    Final    BOTTLES DRAWN AEROBIC AND ANAEROBIC Blood Culture results may not be optimal due to an excessive volume of blood received in culture bottles   Culture   Final    NO GROWTH 2 DAYS Performed at [facility] [address] [place], [place] [address]    Report Status PENDING  Incomplete  Blood culture (routine x 2)     Status: None (Preliminary result)   Collection Time: 10/31/18  1:13 AM  Result Value Ref Range Status   Specimen Description BLOOD RIGHT HAND  Final   Special Requests   Final    BOTTLES DRAWN AEROBIC AND ANAEROBIC Blood Culture results may not be optimal due to an excessive volume of blood received in culture bottles   Culture   Final    NO GROWTH 2 DAYS Performed at [facility], [place] [address], [place], [place] [address]    Report Status PENDING  Incomplete  Respiratory Panel by PCR     Status: Abnormal   Collection Time: 10/31/18  3:32 AM  Result Value Ref Range Status   Adenovirus NOT DETECTED NOT DETECTED Final   Coronavirus 229E NOT DETECTED NOT DETECTED Final    Comment: (NOTE) The Coronavirus on the Respiratory Panel, DOES NOT test for the novel  Coronavirus (2019 nCoV)    Coronavirus HKU1 NOT DETECTED NOT DETECTED Final   Coronavirus NL63 NOT DETECTED NOT DETECTED Final   Coronavirus OC43 NOT DETECTED NOT DETECTED Final   Metapneumovirus NOT DETECTED NOT DETECTED Final   Rhinovirus / Enterovirus DETECTED (A) NOT DETECTED Final   Influenza A NOT DETECTED NOT DETECTED Final   Influenza B NOT DETECTED NOT DETECTED Final   Parainfluenza Virus 1 NOT DETECTED NOT DETECTED Final   Parainfluenza Virus 2 NOT DETECTED NOT DETECTED Final   Parainfluenza Virus 3 NOT DETECTED NOT DETECTED Final   Parainfluenza Virus 4 NOT DETECTED NOT DETECTED Final   Respiratory Syncytial Virus NOT DETECTED NOT DETECTED Final   Bordetella pertussis NOT DETECTED NOT DETECTED Final   Chlamydophila pneumoniae NOT DETECTED NOT DETECTED Final   Mycoplasma pneumoniae  NOT DETECTED NOT DETECTED Final    Comment: Performed at [facility], [place]. [address], [place], [place] [address]    Alcide Evener, [place] [facility] [phone] pager  11/02/2018, 4:55 PM

## 2018-11-02 NOTE — Discharge Summary (Signed)
Physician Discharge Summary  Patient ID: Katie Lyons MRN: [id number] DOB/AGE: [birth year] 35 y.o.  Admit date: 10/31/2018 Discharge date: 11/02/2018  Admission Diagnoses:  Discharge Diagnoses:  Principal Problem:   Possible sepsis ([place]) Active Problems: Possible viral syndrome (positive rhinovirus as per respiratory virus panel)   Recent history of UTI secondary to E. Coli   Status post kidney transplantation within the last 6 months   Anemia in chronic kidney disease   HTN (hypertension)   GERD (gastroesophageal reflux disease)   Kidney transplanted   Abnormal LFTs   Discharged Condition: stable  Hospital Course:  Katie Lyons is a 33 year old female with past medical history significant for end-stage renal disease status post renal transplantation within the last 6 months on mycophenolate, tacrolimus, prednisone, Valtrex and Bactrim; patient started hematuria, hypertension, anemia, anxiety, depression, secondary hyperparathyroidism and history of TBI.  Kidney transplantation was done at [facility].  Apparently, patient was seen recently at the [facility] with dysuria and work-up done revealed pansensitive E. coli.  Patient was on Cipro prior to presentation.  Patient presented with fever, chills, body aches, muscle pain and poor appetite.  As per the documentation on presentation to the hospital, no cough, no runny nose, no sore throat, no nausea or vomiting, no diarrhea, no shortness of breath no abdominal pain.  On presentation to the ED, ED provider was said to have sent out test for Novel coronavirus (covid 19).  Patient was admitted for further assessment and management.  Blood culture was sent on presentation, and has not grown any organisms to date.  Influenza a and B PCR came back negative.  Respiratory viral panel was positive for rhinovirus.  Coronavirus testing is still pending.  Patient was initially managed with IV vancomycin and  cefepime, and the antibiotics eventually narrowed to cefepime.  Patient's symptoms have resolved.  Patient is eager to be discharged back home.  Local infectious disease consultant, Dr. Tommy Medal was consulted to assist with patient's management.  Infectious disease has cleared patient for discharge without any further antibiotics.  Also discussed with local nephrologist, Dr. Roney Jaffe and [facility] to transplant team, Dr. Ulice Dash over the phone.  The nephrology and the transplant team have also advised discharging patient home.  The patient will remain on isolation as per protocol, outlined by the infectious disease team.  The transplant team has advised that the patient should call the transplant coordinator on Monday, 11/04/2018.  Patient is eager to be discharged back home.  Patient will follow-up with the primary care provider and the kidney transplant team within 1 week of discharge.  Consults: ID.  Discussed case over the phone with Dr. Roney Jaffe (local nephrologist) as well as Dr. Ulice Dash with a kidney transplant team at [facility].  Significant Diagnostic Studies:  Respiratory panel by PCR was positive for rhinovirus. Negative influenza a and B. Novel coronavirus is still pending.  Discharge Exam: Blood pressure 116/74, pulse 71, temperature 97.8 F (36.6 C), temperature source Oral, resp. rate 17, height 4\' 11"  (1.499 m), weight 58.2 kg, last menstrual period 10/31/2018, SpO2 100 %.   Disposition: Discharge disposition: 01-Home or Self Care   Discharge Instructions    Diet - low sodium heart healthy   Complete by:  As directed    Increase activity slowly   Complete by:  As directed      Allergies as of 11/02/2018   No Known Allergies     Medication List  STOP taking these medications   ciprofloxacin 500 MG tablet Commonly known as:  CIPRO     TAKE these medications   acetaminophen 500 MG tablet Commonly known as:  TYLENOL Take 500  mg by mouth every 6 (six) hours as needed for mild pain.   amLODipine 2.5 MG tablet Commonly known as:  NORVASC Take 1 tablet (2.5 mg total) by mouth daily. What changed:    medication strength  how much to take   carvedilol 25 MG tablet Commonly known as:  COREG Take 25 mg by mouth 2 (two) times daily with a meal.   lisinopril 10 MG tablet Commonly known as:  PRINIVIL,ZESTRIL Take 1 tablet (10 mg total) by mouth at bedtime. What changed:    medication strength  how much to take   mycophenolate 180 MG EC tablet Commonly known as:  MYFORTIC Take 360 mg by mouth 2 (two) times daily.   omeprazole 20 MG capsule Commonly known as:  PRILOSEC Take 20 mg by mouth daily.   phosphorus 155-852-130 MG tablet Commonly known as:  K PHOS NEUTRAL Take 250 mg by mouth 2 (two) times daily.   predniSONE 5 MG tablet Commonly known as:  DELTASONE Take 5 mg by mouth daily.   sodium bicarbonate 650 MG tablet Take 650 mg by mouth 2 (two) times daily.   sulfamethoxazole-trimethoprim 400-80 MG tablet Commonly known as:  BACTRIM,SEPTRA Take 1 tablet by mouth 3 (three) times a week.   tacrolimus 1 MG capsule Commonly known as:  PROGRAF Take 5 mg by mouth 2 (two) times daily.   valGANciclovir 450 MG tablet Commonly known as:  VALCYTE Take 900 mg by mouth 2 (two) times daily.        SignedBonnell Public 11/02/2018, 6:23 PM

## 2018-11-05 LAB — CULTURE, BLOOD (ROUTINE X 2)
CULTURE: NO GROWTH
Culture: NO GROWTH

## 2018-11-06 LAB — NOVEL CORONAVIRUS, NAA (HOSP ORDER, SEND-OUT TO REF LAB; TAT 18-24 HRS): SARS-CoV-2, NAA: NOT DETECTED

## 2019-05-14 DIAGNOSIS — M722 Plantar fascial fibromatosis: Secondary | ICD-10-CM | POA: Insufficient documentation

## 2019-06-24 DIAGNOSIS — B348 Other viral infections of unspecified site: Secondary | ICD-10-CM | POA: Insufficient documentation

## 2019-08-26 ENCOUNTER — Ambulatory Visit: Payer: BLUE CROSS/BLUE SHIELD | Attending: Internal Medicine

## 2019-08-26 DIAGNOSIS — Z20822 Contact with and (suspected) exposure to covid-19: Secondary | ICD-10-CM

## 2019-08-27 ENCOUNTER — Telehealth: Payer: Self-pay | Admitting: *Deleted

## 2019-08-27 LAB — NOVEL CORONAVIRUS, NAA: SARS-CoV-2, NAA: DETECTED — AB

## 2019-08-27 NOTE — Telephone Encounter (Signed)
Patient's daughter is calling- tested due to symptoms: chest pain- with deep breath, headache, loss taste/smell, cough. Patient is a transplant patient- and they plan to call PCP in the morning. Discussed severe symptoms: trouble breathing, dehydration and weakness and to go to ED for these. Discussed isolation and getting daughter tested. CDC criteria for ending isolation reviewed. Daughter is 40 and translated for mother.

## 2019-08-29 ENCOUNTER — Other Ambulatory Visit: Payer: Self-pay | Admitting: Adult Health

## 2019-08-29 ENCOUNTER — Telehealth: Payer: Self-pay | Admitting: Adult Health

## 2019-08-29 DIAGNOSIS — U071 COVID-19: Secondary | ICD-10-CM

## 2019-08-29 NOTE — Telephone Encounter (Signed)
Called patient through interpretive services with Vonna Kotyk [id number]  Date Tested: 08/26/2019  Date of Symptom onset: 08/24/2019   Symptoms: headaches, loss of taste and smell, cough  High Risk for complications: s/p kidney transplant       Reviewed monoclonal antibody treatment with patient in detail.  See orders only encounter.  Patient scheduled for Monday 09/01/2019 at 1230.     Wilber Bihari, NP

## 2019-08-29 NOTE — Progress Notes (Signed)
  I connected by phone with Katie Lyons on 08/29/2019 at 5:39 PM with interpretive services to discuss the potential use of an new treatment for mild to moderate COVID-19 viral infection in non-hospitalized patients.  This patient is a 34 y.o. female that meets the FDA criteria for Emergency Use Authorization of bamlanivimab or casirivimab\imdevimab.  Has a (+) direct SARS-CoV-2 viral test result  Has mild or moderate COVID-19   Is ? 36 years of age and weighs ? 40 kg  Is NOT hospitalized due to COVID-19  Is NOT requiring oxygen therapy or requiring an increase in baseline oxygen flow rate due to COVID-19  Is within 10 days of symptom onset  Has at least one of the high risk factor(s) for progression to severe COVID-19 and/or hospitalization as defined in EUA.  Specific high risk criteria : Immunosuppressive Disease   I have spoken and communicated the following to the patient or parent/caregiver:  1. FDA has authorized the emergency use of bamlanivimab and casirivimab\imdevimab for the treatment of mild to moderate COVID-19 in adults and pediatric patients with positive results of direct SARS-CoV-2 viral testing who are 95 years of age and older weighing at least 40 kg, and who are at high risk for progressing to severe COVID-19 and/or hospitalization.  2. The significant known and potential risks and benefits of bamlanivimab and casirivimab\imdevimab, and the extent to which such potential risks and benefits are unknown.  3. Information on available alternative treatments and the risks and benefits of those alternatives, including clinical trials.  4. Patients treated with bamlanivimab and casirivimab\imdevimab should continue to self-isolate and use infection control measures (e.g., wear mask, isolate, social distance, avoid sharing personal items, clean and disinfect "high touch" surfaces, and frequent handwashing) according to CDC guidelines.   5. The patient or  parent/caregiver has the option to accept or refuse bamlanivimab or casirivimab\imdevimab .  After reviewing this information with the patient, The patient agreed to proceed with receiving the bamlanimivab infusion and will be provided a copy of the Fact sheet prior to receiving the infusion.Scot Dock 08/29/2019 5:39 PM

## 2019-08-31 ENCOUNTER — Encounter (HOSPITAL_COMMUNITY): Payer: Self-pay | Admitting: *Deleted

## 2019-08-31 ENCOUNTER — Inpatient Hospital Stay (HOSPITAL_COMMUNITY)
Admission: EM | Admit: 2019-08-31 | Discharge: 2019-09-02 | DRG: 177 | Disposition: A | Discharge status: Home / Self Care | Payer: BLUE CROSS/BLUE SHIELD | Attending: Internal Medicine | Admitting: Internal Medicine

## 2019-08-31 ENCOUNTER — Emergency Department (HOSPITAL_COMMUNITY): Payer: BLUE CROSS/BLUE SHIELD

## 2019-08-31 ENCOUNTER — Other Ambulatory Visit: Payer: Self-pay

## 2019-08-31 DIAGNOSIS — N2581 Secondary hyperparathyroidism of renal origin: Secondary | ICD-10-CM | POA: Diagnosis present

## 2019-08-31 DIAGNOSIS — U071 COVID-19: Principal | ICD-10-CM

## 2019-08-31 DIAGNOSIS — Z79899 Other long term (current) drug therapy: Secondary | ICD-10-CM | POA: Diagnosis not present

## 2019-08-31 DIAGNOSIS — D849 Immunodeficiency, unspecified: Secondary | ICD-10-CM

## 2019-08-31 DIAGNOSIS — D6959 Other secondary thrombocytopenia: Secondary | ICD-10-CM | POA: Diagnosis present

## 2019-08-31 DIAGNOSIS — J9601 Acute respiratory failure with hypoxia: Secondary | ICD-10-CM

## 2019-08-31 DIAGNOSIS — I10 Essential (primary) hypertension: Secondary | ICD-10-CM | POA: Diagnosis present

## 2019-08-31 DIAGNOSIS — J9600 Acute respiratory failure, unspecified whether with hypoxia or hypercapnia: Secondary | ICD-10-CM | POA: Diagnosis present

## 2019-08-31 DIAGNOSIS — Z792 Long term (current) use of antibiotics: Secondary | ICD-10-CM | POA: Diagnosis not present

## 2019-08-31 DIAGNOSIS — Z87820 Personal history of traumatic brain injury: Secondary | ICD-10-CM | POA: Diagnosis not present

## 2019-08-31 DIAGNOSIS — Z7952 Long term (current) use of systemic steroids: Secondary | ICD-10-CM | POA: Diagnosis not present

## 2019-08-31 DIAGNOSIS — Z940 Kidney transplant status: Secondary | ICD-10-CM | POA: Diagnosis not present

## 2019-08-31 DIAGNOSIS — K219 Gastro-esophageal reflux disease without esophagitis: Secondary | ICD-10-CM | POA: Diagnosis present

## 2019-08-31 DIAGNOSIS — J1282 Pneumonia due to coronavirus disease 2019: Secondary | ICD-10-CM

## 2019-08-31 DIAGNOSIS — D696 Thrombocytopenia, unspecified: Secondary | ICD-10-CM | POA: Diagnosis present

## 2019-08-31 LAB — CBC
HCT: 43.4 % (ref 36.0–46.0)
Hemoglobin: 14.1 g/dL (ref 12.0–15.0)
MCH: 29.7 pg (ref 26.0–34.0)
MCHC: 32.5 g/dL (ref 30.0–36.0)
MCV: 91.4 fL (ref 80.0–100.0)
Platelets: 130 10*3/uL — ABNORMAL LOW (ref 150–400)
RBC: 4.75 MIL/uL (ref 3.87–5.11)
RDW: 12.2 % (ref 11.5–15.5)
WBC: 9.4 10*3/uL (ref 4.0–10.5)
nRBC: 0 % (ref 0.0–0.2)

## 2019-08-31 LAB — LACTIC ACID, PLASMA
Lactic Acid, Venous: 1.3 mmol/L (ref 0.5–1.9)
Lactic Acid, Venous: 1.1 mmol/L (ref 0.5–1.9)

## 2019-08-31 LAB — I-STAT BETA HCG BLOOD, ED (MC, WL, AP ONLY): I-stat hCG, quantitative: <5 m[IU]/mL (ref <5)

## 2019-08-31 LAB — COMPREHENSIVE METABOLIC PANEL
ALT: 67 U/L — ABNORMAL HIGH (ref 0–44)
AST: 64 U/L — ABNORMAL HIGH (ref 15–41)
Albumin: 3.4 g/dL — ABNORMAL LOW (ref 3.5–5.0)
Alkaline Phosphatase: 247 U/L — ABNORMAL HIGH (ref 38–126)
Anion gap: 10 (ref 5–15)
BUN: 15 mg/dL (ref 6–20)
CO2: 16 mmol/L — ABNORMAL LOW (ref 22–32)
Calcium: 8.9 mg/dL (ref 8.9–10.3)
Chloride: 110 mmol/L (ref 98–111)
Creatinine, Ser: 1.04 mg/dL — ABNORMAL HIGH (ref 0.44–1.00)
GFR calc Af Amer: >60 mL/min (ref >60)
GFR calc non Af Amer: >60 mL/min (ref >60)
Glucose, Bld: 174 mg/dL — ABNORMAL HIGH (ref 70–99)
Potassium: 3.9 mmol/L (ref 3.5–5.1)
Sodium: 136 mmol/L (ref 135–145)
Total Bilirubin: 0.5 mg/dL (ref 0.3–1.2)
Total Protein: 6.6 g/dL (ref 6.5–8.1)

## 2019-08-31 LAB — LIPASE, BLOOD: Lipase: 26 U/L (ref 11–51)

## 2019-08-31 MED ORDER — ALBUTEROL SULFATE HFA 108 (90 BASE) MCG/ACT IN AERS
4.0000 | INHALATION_SPRAY | Freq: Once | RESPIRATORY_TRACT | Status: AC
  Administered 2019-08-31: 4 via RESPIRATORY_TRACT
  Filled 2019-08-31: qty 6.7

## 2019-08-31 MED ORDER — SODIUM CHLORIDE 0.9% FLUSH: 3.0000 mL | Freq: Once | INTRAVENOUS | Status: DC

## 2019-08-31 MED ORDER — AEROCHAMBER PLUS FLO-VU LARGE MISC
Status: AC
  Administered 2019-08-31: 1
  Filled 2019-08-31: qty 1

## 2019-08-31 MED ORDER — AEROCHAMBER PLUS FLO-VU LARGE MISC: 1.0000 | Freq: Once | Status: AC

## 2019-08-31 MED ORDER — ACETAMINOPHEN 325 MG PO TABS
650.0000 mg | ORAL_TABLET | Freq: Once | ORAL | Status: AC
  Administered 2019-08-31: 650 mg via ORAL
  Filled 2019-08-31: qty 2
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
## 2019-08-31 NOTE — ED Triage Notes (Signed)
The pt is here after being pos for covid Tuesday  Post kidney transplant September 2020 feeling worse at home chest pain with the cough  lmp none at present  Maybe after transplant is older

## 2019-08-31 NOTE — ED Notes (Addendum)
Pt placed 2L [employer] for comfort.  

## 2019-09-01 ENCOUNTER — Encounter (HOSPITAL_COMMUNITY): Payer: Self-pay | Admitting: Internal Medicine

## 2019-09-01 ENCOUNTER — Ambulatory Visit (HOSPITAL_COMMUNITY): Payer: BLUE CROSS/BLUE SHIELD

## 2019-09-01 ENCOUNTER — Other Ambulatory Visit: Payer: Self-pay

## 2019-09-01 DIAGNOSIS — J9600 Acute respiratory failure, unspecified whether with hypoxia or hypercapnia: Secondary | ICD-10-CM

## 2019-09-01 DIAGNOSIS — U071 COVID-19: Principal | ICD-10-CM

## 2019-09-01 DIAGNOSIS — D696 Thrombocytopenia, unspecified: Secondary | ICD-10-CM | POA: Diagnosis present

## 2019-09-01 LAB — LACTATE DEHYDROGENASE: LDH: 195 U/L — ABNORMAL HIGH (ref 98–192)

## 2019-09-01 LAB — URINALYSIS, ROUTINE W REFLEX MICROSCOPIC
Bacteria, UA: NONE SEEN
Bilirubin Urine: NEGATIVE
Glucose, UA: >=500 mg/dL — AB
Ketones, ur: NEGATIVE mg/dL
Leukocytes,Ua: NEGATIVE
Nitrite: NEGATIVE
Protein, ur: NEGATIVE mg/dL
Specific Gravity, Urine: 1.023 (ref 1.005–1.030)
pH: 6 (ref 5.0–8.0)

## 2019-09-01 LAB — CBC
HCT: 40.9 % (ref 36.0–46.0)
Hemoglobin: 13.5 g/dL (ref 12.0–15.0)
MCH: 30 pg (ref 26.0–34.0)
MCHC: 33 g/dL (ref 30.0–36.0)
MCV: 90.9 fL (ref 80.0–100.0)
Platelets: 115 10*3/uL — ABNORMAL LOW (ref 150–400)
RBC: 4.5 MIL/uL (ref 3.87–5.11)
RDW: 12.4 % (ref 11.5–15.5)
WBC: 8.6 10*3/uL (ref 4.0–10.5)
nRBC: 0 % (ref 0.0–0.2)

## 2019-09-01 LAB — CBC WITH DIFFERENTIAL/PLATELET
Abs Immature Granulocytes: 0.04 10*3/uL (ref 0.00–0.07)
Basophils Absolute: 0 10*3/uL (ref 0.0–0.1)
Basophils Relative: 0 %
Eosinophils Absolute: 0 10*3/uL (ref 0.0–0.5)
Eosinophils Relative: 0 %
HCT: 40.4 % (ref 36.0–46.0)
Hemoglobin: 13.4 g/dL (ref 12.0–15.0)
Immature Granulocytes: 1 %
Lymphocytes Relative: 7 %
Lymphs Abs: 0.6 10*3/uL — ABNORMAL LOW (ref 0.7–4.0)
MCH: 30 pg (ref 26.0–34.0)
MCHC: 33.2 g/dL (ref 30.0–36.0)
MCV: 90.6 fL (ref 80.0–100.0)
Monocytes Absolute: 0.4 10*3/uL (ref 0.1–1.0)
Monocytes Relative: 4 %
Neutro Abs: 7.8 10*3/uL — ABNORMAL HIGH (ref 1.7–7.7)
Neutrophils Relative %: 88 %
Platelets: 118 10*3/uL — ABNORMAL LOW (ref 150–400)
RBC: 4.46 MIL/uL (ref 3.87–5.11)
RDW: 12.4 % (ref 11.5–15.5)
WBC: 8.8 10*3/uL (ref 4.0–10.5)
nRBC: 0 % (ref 0.0–0.2)

## 2019-09-01 LAB — COMPREHENSIVE METABOLIC PANEL
ALT: 61 U/L — ABNORMAL HIGH (ref 0–44)
AST: 52 U/L — ABNORMAL HIGH (ref 15–41)
Albumin: 3.2 g/dL — ABNORMAL LOW (ref 3.5–5.0)
Alkaline Phosphatase: 229 U/L — ABNORMAL HIGH (ref 38–126)
Anion gap: 9 (ref 5–15)
BUN: 17 mg/dL (ref 6–20)
CO2: 17 mmol/L — ABNORMAL LOW (ref 22–32)
Calcium: 8.7 mg/dL — ABNORMAL LOW (ref 8.9–10.3)
Chloride: 112 mmol/L — ABNORMAL HIGH (ref 98–111)
Creatinine, Ser: 1.14 mg/dL — ABNORMAL HIGH (ref 0.44–1.00)
GFR calc Af Amer: >60 mL/min (ref >60)
GFR calc non Af Amer: >60 mL/min (ref >60)
Glucose, Bld: 127 mg/dL — ABNORMAL HIGH (ref 70–99)
Potassium: 3.5 mmol/L (ref 3.5–5.1)
Sodium: 138 mmol/L (ref 135–145)
Total Bilirubin: 0.7 mg/dL (ref 0.3–1.2)
Total Protein: 6.1 g/dL — ABNORMAL LOW (ref 6.5–8.1)

## 2019-09-01 LAB — HEMOGLOBIN A1C
Hgb A1c MFr Bld: 5.8 % — ABNORMAL HIGH (ref 4.8–5.6)
Mean Plasma Glucose: 119.76 mg/dL

## 2019-09-01 LAB — FERRITIN: Ferritin: 582 ng/mL — ABNORMAL HIGH (ref 11–307)

## 2019-09-01 LAB — TROPONIN I (HIGH SENSITIVITY)
Troponin I (High Sensitivity): 9 ng/L (ref <18)
Troponin I (High Sensitivity): 15 ng/L (ref <18)

## 2019-09-01 LAB — C-REACTIVE PROTEIN: CRP: 3.6 mg/dL — ABNORMAL HIGH (ref <1.0)

## 2019-09-01 LAB — CREATININE, SERUM
Creatinine, Ser: 1.08 mg/dL — ABNORMAL HIGH (ref 0.44–1.00)
GFR calc Af Amer: >60 mL/min (ref >60)
GFR calc non Af Amer: >60 mL/min (ref >60)

## 2019-09-01 LAB — PROCALCITONIN: Procalcitonin: 0.17 ng/mL

## 2019-09-01 LAB — D-DIMER, QUANTITATIVE: D-Dimer, Quant: 0.31 ug/mL-FEU (ref 0.00–0.50)

## 2019-09-01 MED ORDER — SODIUM BICARBONATE 650 MG PO TABS
650.0000 mg | ORAL_TABLET | Freq: Two times a day (BID) | ORAL | Status: DC
  Administered 2019-09-01 – 2019-09-02 (×3): 650 mg via ORAL
  Filled 2019-09-01 (×4): qty 1

## 2019-09-01 MED ORDER — ENOXAPARIN SODIUM 40 MG/0.4ML ~~LOC~~ SOLN
40.0000 mg | Freq: Every day | SUBCUTANEOUS | Status: DC
  Administered 2019-09-01 – 2019-09-02 (×2): 40 mg via SUBCUTANEOUS
  Filled 2019-09-01 (×2): qty 0.4

## 2019-09-01 MED ORDER — MAGNESIUM OXIDE 400 (241.3 MG) MG PO TABS
400.0000 mg | ORAL_TABLET | Freq: Two times a day (BID) | ORAL | Status: DC
  Administered 2019-09-01 – 2019-09-02 (×3): 400 mg via ORAL
  Filled 2019-09-01 (×3): qty 1

## 2019-09-01 MED ORDER — SODIUM CHLORIDE 0.9 % IV SOLN: INTRAVENOUS | Status: DC | PRN

## 2019-09-01 MED ORDER — ONDANSETRON HCL 4 MG/2ML IJ SOLN: 4.0000 mg | Freq: Four times a day (QID) | INTRAMUSCULAR | Status: DC | PRN

## 2019-09-01 MED ORDER — SODIUM CHLORIDE 0.9 % IV SOLN
200.0000 mg | Freq: Once | INTRAVENOUS | Status: AC
  Administered 2019-09-01: 200 mg via INTRAVENOUS
  Filled 2019-09-01: qty 200

## 2019-09-01 MED ORDER — SODIUM CHLORIDE 0.9 % IV SOLN
100.0000 mg | Freq: Every day | INTRAVENOUS | Status: DC
  Administered 2019-09-02: 100 mg via INTRAVENOUS
  Filled 2019-09-01: qty 20

## 2019-09-01 MED ORDER — LISINOPRIL 10 MG PO TABS: 10.0000 mg | ORAL_TABLET | Freq: Every day | ORAL | Status: DC

## 2019-09-01 MED ORDER — METHYLPREDNISOLONE SODIUM SUCC 125 MG IJ SOLR: 125.0000 mg | Freq: Once | INTRAMUSCULAR | Status: DC | PRN

## 2019-09-01 MED ORDER — EPINEPHRINE 0.3 MG/0.3ML IJ SOAJ: 0.3000 mg | Freq: Once | INTRAMUSCULAR | Status: DC | PRN

## 2019-09-01 MED ORDER — MYCOPHENOLATE SODIUM 180 MG PO TBEC
360.0000 mg | DELAYED_RELEASE_TABLET | Freq: Two times a day (BID) | ORAL | Status: DC
  Administered 2019-09-01 – 2019-09-02 (×3): 360 mg via ORAL
  Filled 2019-09-01 (×4): qty 2

## 2019-09-01 MED ORDER — K PHOS MONO-SOD PHOS DI & MONO 155-852-130 MG PO TABS: 250.0000 mg | ORAL_TABLET | Freq: Two times a day (BID) | ORAL | Status: DC

## 2019-09-01 MED ORDER — SODIUM CHLORIDE 0.9 % IV SOLN: 700.0000 mg | Freq: Once | INTRAVENOUS | Status: DC

## 2019-09-01 MED ORDER — TACROLIMUS 1 MG PO CAPS
5.0000 mg | ORAL_CAPSULE | Freq: Two times a day (BID) | ORAL | Status: DC
  Administered 2019-09-01 – 2019-09-02 (×3): 5 mg via ORAL
  Filled 2019-09-01 (×4): qty 5

## 2019-09-01 MED ORDER — ALBUTEROL SULFATE HFA 108 (90 BASE) MCG/ACT IN AERS: 2.0000 | INHALATION_SPRAY | Freq: Once | RESPIRATORY_TRACT | Status: DC | PRN

## 2019-09-01 MED ORDER — CARVEDILOL 12.5 MG PO TABS: 25.0000 mg | ORAL_TABLET | Freq: Two times a day (BID) | ORAL | Status: DC

## 2019-09-01 MED ORDER — AMLODIPINE BESYLATE 5 MG PO TABS: 2.5000 mg | ORAL_TABLET | Freq: Every day | ORAL | Status: DC

## 2019-09-01 MED ORDER — ACETAMINOPHEN 325 MG PO TABS: 650.0000 mg | ORAL_TABLET | Freq: Four times a day (QID) | ORAL | Status: DC | PRN

## 2019-09-01 MED ORDER — ONDANSETRON HCL 4 MG PO TABS: 4.0000 mg | ORAL_TABLET | Freq: Four times a day (QID) | ORAL | Status: DC | PRN

## 2019-09-01 MED ORDER — PANTOPRAZOLE SODIUM 40 MG PO TBEC: 40.0000 mg | DELAYED_RELEASE_TABLET | Freq: Every day | ORAL | Status: DC

## 2019-09-01 MED ORDER — SULFAMETHOXAZOLE-TRIMETHOPRIM 400-80 MG PO TABS
1.0000 | ORAL_TABLET | ORAL | Status: DC
  Administered 2019-09-01: 1 via ORAL
  Filled 2019-09-01: qty 1

## 2019-09-01 MED ORDER — ACETAMINOPHEN 650 MG RE SUPP: 650.0000 mg | Freq: Four times a day (QID) | RECTAL | Status: DC | PRN

## 2019-09-01 MED ORDER — DEXAMETHASONE SODIUM PHOSPHATE 10 MG/ML IJ SOLN
6.0000 mg | Freq: Every day | INTRAMUSCULAR | Status: DC
  Administered 2019-09-01 – 2019-09-02 (×2): 6 mg via INTRAVENOUS
  Filled 2019-09-01 (×3): qty 1

## 2019-09-01 MED ORDER — FAMOTIDINE IN NACL 20-0.9 MG/50ML-% IV SOLN: 20.0000 mg | Freq: Once | INTRAVENOUS | Status: DC | PRN

## 2019-09-01 MED ORDER — DIPHENHYDRAMINE HCL 50 MG/ML IJ SOLN: 50.0000 mg | Freq: Once | INTRAMUSCULAR | Status: DC | PRN

## 2019-09-01 NOTE — ED Notes (Signed)
Service at bedside

## 2019-09-01 NOTE — Progress Notes (Signed)
Pharmacy Medication Storage Note  Storing home medications for Katie Lyons in pharmacy secured storage.   Medication storage bag number: [id number]  Delivered to pharmacy @ 1200 09/01/19  Medications will be returned to patient/caregiver upon discharge.

## 2019-09-01 NOTE — ED Notes (Signed)
Pt resting in bed. No distress noted. Will continue to monitor.  

## 2019-09-01 NOTE — ED Notes (Signed)
Blood sent to lab

## 2019-09-01 NOTE — ED Notes (Signed)
Pt denies new or worsening complaints. Will continue to monitor. No distress noted.  

## 2019-09-01 NOTE — ED Notes (Signed)
Patient transported to [facility] with [facility] at this time

## 2019-09-01 NOTE — H&P (Signed)
History and Physical    Katie Lyons [id number] DOB: 1986-01-03 DOA: 08/31/2019  PCP: Fleet Contras, MD  Patient coming from: Home.  Chief Complaint: Shortness of breath.  Patent attorney used.  HPI: Katie Lyons is a 34 y.o. female with history of renal transplant on immunosuppressants presents to the ER because of worsening shortness of breath with pleuritic type of chest pain and cough fever and chills.  Patient symptoms started about 10 days ago and on August 26, 2019 patient tested positive for COVID-19 test about 6 days ago.  Since then his symptoms have been getting worse so she decided to come to the ER.  Denies any vomiting or diarrhea.  ED Course: In the ER chest x-ray shows bilateral infiltrates patient had a fever 101.9 F.  Given the immunosuppressed state with ongoing shortness of breath and chest x-ray infiltrates with fever now we will admit patient for further management of COVID-19 infection.  Lab work show creatinine 1.04 bicarb of 16 anion gap of 10, platelets is 130.  EKG shows normal sinus rhythm.  Review of Systems: As per HPI, rest all negative.   Past Medical History:  Diagnosis Date  . Anxiety   . Chronic ischemic heart disease   . Depression    3-4 years ago per husband  . End stage renal disease ([place])   . Headache   . Hypertension   . Iron deficiency   . Secondary hyperparathyroidism ([place])   . Traumatic brain injury [facility])     Past Surgical History:  Procedure Laterality Date  . ARTERIOVENOUS GRAFT PLACEMENT    . AV FISTULA PLACEMENT Left 04/13/2016   Procedure: UPPER EXTREMITY LEFT  ARTERIOVENOUS (AV) FISTULA CREATION;  Surgeon: Waynetta Sandy, MD;  Location: [place];  Service: Vascular;  Laterality: Left;  . [place] REMOVAL Right 04/10/2016   Procedure: REMOVAL OF Right Leg ARTERIOVENOUS GORETEX GRAFT ([place]);  Surgeon: Waynetta Sandy, MD;  Location: [place];  Service: Vascular;  Laterality: Right;  .  INSERTION OF DIALYSIS CATHETER Right 04/10/2016   Procedure: insertion Dialysis catheter;  Surgeon: Waynetta Sandy, MD;  Location: [place];  Service: Vascular;  Laterality: Right;  . INSERTION OF DIALYSIS CATHETER Right 04/13/2016   Procedure: INSERTION OF DIALYSIS CATHETER RIGHT INTERNAL JUGULAR;  Surgeon: Waynetta Sandy, MD;  Location: [place];  Service: Vascular;  Laterality: Right;  . Kidney Transplant x 2 months ago    . NO PAST SURGERIES    . REVISION OF ARTERIOVENOUS GORETEX GRAFT Right 01/18/2016   Procedure: REVISION OF ARTERIOVENOUS GORETEX GRAFT Right leg;  Surgeon: Rosetta Posner, MD;  Location: [place];  Service: Vascular;  Laterality: Right;     reports that she has never smoked. She has never used smokeless tobacco. She reports that she does not drink alcohol or use drugs.  No Known Allergies  History reviewed. No pertinent family history.  Prior to Admission medications   Medication Sig Start Date End Date Taking? Authorizing Provider  magnesium oxide (MAG-OX) 400 MG tablet Take 400 mg by mouth 2 (two) times daily.   Yes [provider]  mycophenolate (MYFORTIC) 180 MG EC tablet Take 540 mg by mouth 2 (two) times daily. 3 tabs Twice daily 10/09/18  Yes [provider]  predniSONE (DELTASONE) 5 MG tablet Take 5 mg by mouth daily. 08/23/18  Yes [provider]  sodium bicarbonate 650 MG tablet Take 650 mg by mouth 2 (two) times daily. 05/20/18  Yes [provider]  sulfamethoxazole-trimethoprim (BACTRIM,SEPTRA) 400-80 MG tablet Take 1 tablet by mouth 3 (three) times a week.   Yes [provider]  tacrolimus (PROGRAF) 1 MG capsule Take 5 mg by mouth 2 (two) times daily. 10/07/18  Yes [provider]    Physical Exam: Constitutional: Moderately built and nourished. Vitals:   08/31/19 2349 09/01/19 0000 09/01/19 0400 09/01/19 0500  BP:  116/64 109/74 107/74  Pulse:  (!) 103 73 74  Resp:  (!) 23 (!) 27 (!) 25  Temp:  98.3 F (36.8 C)     TempSrc: Oral     SpO2:  96% 97% 95%   Eyes: Anicteric no pallor. ENMT: No discharge from the ears eyes nose or mouth. Neck: No mass felt.  No neck rigidity. Respiratory: No rhonchi or crepitations. Cardiovascular: S1-S2 heard. Abdomen: Soft nontender bowel sounds present. Musculoskeletal: No edema.  No joint effusion. Skin: No rash. Neurologic: Alert awake oriented to time place and person.  Moves all extremities. Psychiatric: Appears normal per normal affect.   Labs on Admission: I have personally reviewed following labs and imaging studies  CBC: Recent Labs  Lab 08/31/19 2031 09/01/19 0215  WBC 9.4 8.8  NEUTROABS  --  7.8*  HGB 14.1 13.4  HCT 43.4 40.4  MCV 91.4 90.6  PLT 130* 123456*   Basic Metabolic Panel: Recent Labs  Lab 08/31/19 2031 09/01/19 0215  NA 136 138  K 3.9 3.5  CL 110 112*  CO2 16* 17*  GLUCOSE 174* 127*  BUN 15 17  CREATININE 1.04* 1.14*  CALCIUM 8.9 8.7*   GFR: CrCl cannot be calculated (Unknown ideal weight.). Liver Function Tests: Recent Labs  Lab 08/31/19 2031 09/01/19 0215  AST 64* 52*  ALT 67* 61*  ALKPHOS 247* 229*  BILITOT 0.5 0.7  PROT 6.6 6.1*  ALBUMIN 3.4* 3.2*   Recent Labs  Lab 08/31/19 2031  LIPASE 26   No results for input(s): AMMONIA in the last 168 hours. Coagulation Profile: No results for input(s): INR, PROTIME in the last 168 hours. Cardiac Enzymes: No results for input(s): CKTOTAL, CKMB, CKMBINDEX, TROPONINI in the last 168 hours. BNP (last 3 results) No results for input(s): PROBNP in the last 8760 hours. HbA1C: Recent Labs    09/01/19 0215  HGBA1C 5.8*   CBG: No results for input(s): GLUCAP in the last 168 hours. Lipid Profile: No results for input(s): CHOL, HDL, LDLCALC, TRIG, CHOLHDL, LDLDIRECT in the last 72 hours. Thyroid Function Tests: No results for input(s): TSH, T4TOTAL, FREET4, T3FREE, THYROIDAB in the last 72 hours. Anemia Panel: Recent Labs    09/01/19 0215    FERRITIN 582*   Urine analysis:    Component Value Date/Time   COLORURINE STRAW (A) 10/31/2018 0237   APPEARANCEUR CLEAR 10/31/2018 0237   LABSPEC 1.009 10/31/2018 0237   PHURINE 8.0 10/31/2018 0237   GLUCOSEU NEGATIVE 10/31/2018 0237   HGBUR LARGE (A) 10/31/2018 0237   BILIRUBINUR NEGATIVE 10/31/2018 0237   KETONESUR NEGATIVE 10/31/2018 0237   PROTEINUR NEGATIVE 10/31/2018 0237   UROBILINOGEN 0.2 12/09/2013 1556   NITRITE NEGATIVE 10/31/2018 0237   LEUKOCYTESUR NEGATIVE 10/31/2018 0237   Sepsis Labs: @LABRCNTIP (procalcitonin:4,lacticidven:4) ) Recent Results (from the past 240 hour(s))  Novel Coronavirus, NAA ([facility])     Status: Abnormal   Collection Time: 08/26/19  6:08 PM   Specimen: Nasopharyngeal(NP) swabs in vial transport medium   NASOPHARYNGE  TESTING  Result Value Ref Range Status   SARS-CoV-2, NAA Detected (A) Not Detected Final    Comment:  Testing was performed using the cobas(R) SARS-CoV-2 test. This nucleic acid amplification test was developed and its performance characteristics determined by [employer]. Nucleic acid amplification tests include PCR and TMA. This test has not been FDA cleared or approved. This test has been authorized by FDA under an Emergency Use Authorization (EUA). This test is only authorized for the duration of time the declaration that circumstances exist justifying the authorization of the emergency use of in vitro diagnostic tests for detection of SARS-CoV-2 virus and/or diagnosis of COVID-19 infection under section 564(b)(1) of the Act, 21 U.S.C. [id number]) (1), unless the authorization is terminated or revoked sooner. When diagnostic testing is negative, the possibility of a false negative result should be considered in the context of a patient's recent exposures and the presence of clinical signs and symptoms consistent with COVID-19. An individual without symptoms  of COVID-19 and who is not shedding SARS-CoV-2 virus  would expect to have a negative (not detected) result in this assay.      Radiological Exams on Admission: DG Chest Port 1 View  Result Date: 08/31/2019 CLINICAL DATA:  Cough. EXAM: PORTABLE CHEST 1 VIEW COMPARISON:  October 31, 2018 FINDINGS: There is scattered airspace opacities bilaterally most evident in the right mid and right lower lung zone. There is no pneumothorax. No large pleural effusion. The heart size is enlarged. There is no acute osseous abnormality. IMPRESSION: 1. Scattered subtle bilateral airspace opacities are nonspecific but can be seen in patients with an atypical infectious process such as viral pneumonia. 2. Cardiomegaly. Electronically Signed   By: Constance Holster M.D.   On: 08/31/2019 22:51    EKG: Independently reviewed.  Normal sinus rhythm.  Assessment/Plan Principal Problem:   Acute respiratory failure due to COVID-19 [facility]) Active Problems:   Acute respiratory failure (HCC)   HTN (hypertension)   Kidney transplanted   Thrombocytopenia (HCC)    1. Acute respiratory failure with hypoxia secondary to COVID-19 infection for which I have started patient on IV Decadron and IV remdesivir.  No indication to start Actemra at this time.  Closely monitor respiratory status and inflammatory markers which are pending. 2. History of renal transplant on Prograf and Myfortic.  Patient also takes prednisone but presently on Decadron. 3. Thrombocytopenia likely from infectious process.  Follow CBC closely. 4. Creatinine is around 1.1 which appears to be at baseline when compared to 1 in the March.  Closely monitor.  Given immunosuppressed state with Covid infection with bilateral infiltrates and ongoing fever chills and fatigue will need inpatient status.   DVT prophylaxis: Lovenox.  Note the patient has mild thrombocytopenia. Code Status: Full code. Family Communication: Discussed with patient. Disposition Plan: Home. Consults called: None. Admission status:  Inpatient.   Rise Patience MD [facility] Pager [phone].  If 7PM-7AM, please contact night-coverage [email or web address] Password [facility]  09/01/2019, 5:14 AM

## 2019-09-01 NOTE — Progress Notes (Signed)
34 year old lady prior history of renal transplant on immunosuppressants presents to ED for worsening shortness of breath and chest pain associated with fever chills and dry cough.  Patient symptoms apparently started about 10 days ago and she was tested positive for COVID-19 on August 26, 2019. On arrival to the ED she was found to be febrile, tachycardic, tachypneic hypoxic requiring up to 2 L of nasal cannula oxygen and chest x-ray showing bilateral infiltrates.  She was admitted for evaluation of acute respiratory failure secondary to Covid 19 viral pneumonia and transferred to [facility] for further management.  Patient was admitted earlier this morning by Dr. Hal Hope please see his detailed H&P for further information.   Hosie Poisson, MD

## 2019-09-01 NOTE — ED Notes (Signed)
Medications given and charted per MAR. Tolerated well.  

## 2019-09-01 NOTE — ED Notes (Signed)
Patient gave verbal consent to be transported to [facility] using interpreter services.

## 2019-09-01 NOTE — ED Notes (Signed)
Report given to [facility] RN

## 2019-09-02 MED ORDER — PREDNISONE 10 MG PO TABS: ORAL_TABLET | ORAL | 0 refills | Status: DC

## 2019-09-02 MED ORDER — PREDNISONE 10 MG PO TABS: ORAL_TABLET | ORAL | 0 refills | Status: AC

## 2019-09-02 MED ORDER — PREDNISONE 5 MG PO TABS: 5.0000 mg | ORAL_TABLET | Freq: Every day | ORAL | 0 refills | Status: DC

## 2019-09-02 NOTE — Progress Notes (Signed)
SATURATION QUALIFICATIONS: (This note is used to comply with regulatory documentation for home oxygen)  Patient Saturations on Room Air at Rest = 100%  Patient Saturations on Room Air while Ambulating = 95%  Patient Saturations on 0 Liters of oxygen while Ambulating = 95%  Please briefly explain why patient needs home oxygen: Patient does not require home oxygen. Pt ambulated approximately 938ft while maintaining oxygen above 95%.

## 2019-09-02 NOTE — Progress Notes (Signed)
Used interpreter Ipad for shift assessment as well as morning med pass to assist in patient care.  Questions answered at that time.  Continue to monitor patient.

## 2019-09-02 NOTE — Discharge Summary (Signed)
Physician Discharge Summary  Katie Lyons [id number] DOB: 1985/10/15 DOA: 08/31/2019  PCP: Fleet Contras, MD  Admit date: 08/31/2019 Discharge date: 09/02/2019  Admitted From: Home Disposition: Home  Recommendations for Outpatient Follow-up:  1. Follow up with PCP in 1-2 weeks 2. Please obtain BMP/CBC in one week  Discharge Condition: Stable CODE STATUS: Full Diet recommendation: As tolerated Per nephrology  Brief/Interim Summary: Katie Lyons is a 34 y.o. female with history of renal transplant on immunosuppressants presents to the ER because of worsening shortness of breath with pleuritic type of chest pain and cough fever and chills.  Patient symptoms started about 10 days ago and on August 26, 2019 patient tested positive for COVID-19 test about 6 days ago.  Since then his symptoms have been getting worse so she decided to come to the ER.  Denies any vomiting or diarrhea. In the ER chest x-ray shows bilateral infiltrates patient had a fever 101.9 F.  Given the immunosuppressed state with ongoing shortness of breath and chest x-ray infiltrates with fever now we will admit patient for further management of COVID-19 infection.  Lab work show creatinine 1.04 bicarb of 16 anion gap of 10, platelets is 130.  EKG shows normal sinus rhythm.  Patient admitted as above with acute hypoxic respiratory failure in setting of COVID-19 pneumonia.  Patient on minimal oxygen at admission with 2 L now able to be weaned off with treatment including Remdesivir, steroids.  Given patient symptoms have resolved, no longer on oxygen even with ambulation and appears clinically improved will discharge on remainder of Remdesivir at outpatient [facility] with close follow-up as below.  Patient improved much more drastically than expected, would normally expect an additional 24 to 48 hours minimally to wean off of oxygen however given patient's rapid improvement will discharge with  close follow-up as above.  To report to [facility] at 11:30 on Wednesday, 1/20, Thursday, 1/21, and Friday 1/22 to complete Remdesivir course.  Patient will be discharged on prednisone taper, she is to resume her home prednisone dose on 09/11/2019 after completing prednisone taper.  Patient will need very close follow-up with PCP as well as transplant specialist in the next 1 to 2 weeks to ensure resolution of symptoms and no ongoing issues.  Discharge Diagnoses:  Principal Problem:   Acute respiratory failure due to COVID-19 [employer]) Active Problems:   Acute respiratory failure (HCC)   HTN (hypertension)   Kidney transplanted   Thrombocytopenia ([facility])   COVID-19 virus infection    Discharge Instructions  Discharge Instructions    Call MD for:  difficulty breathing, headache or visual disturbances   Complete by: As directed    Call MD for:  extreme fatigue   Complete by: As directed    Call MD for:  hives   Complete by: As directed    Call MD for:  persistant dizziness or light-headedness   Complete by: As directed    Call MD for:  persistant nausea and vomiting   Complete by: As directed    Call MD for:  severe uncontrolled pain   Complete by: As directed    Call MD for:  temperature >100.4   Complete by: As directed    Increase activity slowly   Complete by: As directed      Allergies as of 09/02/2019   No Known Allergies     Medication List    TAKE these medications   magnesium oxide 400 MG tablet Commonly known as: MAG-OX Take 400 mg by  mouth 2 (two) times daily.   mycophenolate 180 MG EC tablet Commonly known as: MYFORTIC Take 360 mg by mouth 2 (two) times daily. 2 tabs Twice daily   predniSONE 10 MG tablet Commonly known as: DELTASONE Take 3 tablets (30 mg total) by mouth daily for 3 days, THEN 2 tablets (20 mg total) daily for 3 days, THEN 1 tablet (10 mg total) daily for 3 days. Start taking on: September 02, 2019 What changed: You were already taking a  medication with the same name, and this prescription was added. Make sure you understand how and when to take each.   predniSONE 5 MG tablet Commonly known as: DELTASONE Take 1 tablet (5 mg total) by mouth daily. Resume after finishing steroid taper. Start taking on: September 11, 2019 What changed:   additional instructions  These instructions start on September 11, 2019. If you are unsure what to do until then, ask your doctor or other care provider.   sodium bicarbonate 650 MG tablet Take 1,950 mg by mouth 3 (three) times daily. Three tabs (1950mg ) three times daily   sulfamethoxazole-trimethoprim 400-80 MG tablet Commonly known as: BACTRIM Take 1 tablet by mouth 3 (three) times a week.   tacrolimus 1 MG capsule Commonly known as: PROGRAF Take 5 mg by mouth 2 (two) times daily.       No Known Allergies   Procedures/Studies: DG Chest Port 1 View  Result Date: 08/31/2019 CLINICAL DATA:  Cough. EXAM: PORTABLE CHEST 1 VIEW COMPARISON:  October 31, 2018 FINDINGS: There is scattered airspace opacities bilaterally most evident in the right mid and right lower lung zone. There is no pneumothorax. No large pleural effusion. The heart size is enlarged. There is no acute osseous abnormality. IMPRESSION: 1. Scattered subtle bilateral airspace opacities are nonspecific but can be seen in patients with an atypical infectious process such as viral pneumonia. 2. Cardiomegaly. Electronically Signed   By: Constance Holster M.D.   On: 08/31/2019 22:51    Subjective: Patient feels quite well, denies chest pain, shortness of breath, nausea, vomiting, diarrhea, constipation, headache, fevers, chills.   Discharge Exam: Vitals:   09/02/19 0406 09/02/19 0735  BP: 110/66 107/65  Pulse: 61 (!) 56  Resp: 18 (!) 23  Temp: 98.1 F (36.7 C) (!) 96.6 F (35.9 C)  SpO2: 97% 98%   Vitals:   09/01/19 1800 09/01/19 2300 09/02/19 0406 09/02/19 0735  BP:  129/78 110/66 107/65  Pulse: 80 79 61 (!) 56   Resp:  (!) 22 18 (!) 23  Temp:  98.6 F (37 C) 98.1 F (36.7 C) (!) 96.6 F (35.9 C)  TempSrc:  Oral Oral Oral  SpO2: 99% 99% 97% 98%  Weight:    58.9 kg  Height:        General:  Pleasantly resting in bed, No acute distress. HEENT:  Normocephalic atraumatic.  Sclerae nonicteric, noninjected.  Extraocular movements intact bilaterally. Neck:  Without mass or deformity.  Trachea is midline. Lungs:  Clear to auscultate bilaterally without rhonchi, wheeze, or rales. Heart:  Regular rate and rhythm.  Without murmurs, rubs, or gallops. Abdomen:  Soft, nontender, nondistended.  Without guarding or rebound. Extremities: Without cyanosis, clubbing, edema, or obvious deformity. Vascular:  Dorsalis pedis and posterior tibial pulses palpable bilaterally. Skin:  Warm and dry, no erythema, no ulcerations.   The results of significant diagnostics from this hospitalization (including imaging, microbiology, ancillary and laboratory) are listed below for reference.     Microbiology: Recent Results (from the  past 240 hour(s))  Novel Coronavirus, NAA ([facility])     Status: Abnormal   Collection Time: 08/26/19  6:08 PM   Specimen: Nasopharyngeal(NP) swabs in vial transport medium   NASOPHARYNGE  TESTING  Result Value Ref Range Status   SARS-CoV-2, NAA Detected (A) Not Detected Final    Comment: Testing was performed using the cobas(R) SARS-CoV-2 test. This nucleic acid amplification test was developed and its performance characteristics determined by [employer]. Nucleic acid amplification tests include PCR and TMA. This test has not been FDA cleared or approved. This test has been authorized by FDA under an Emergency Use Authorization (EUA). This test is only authorized for the duration of time the declaration that circumstances exist justifying the authorization of the emergency use of in vitro diagnostic tests for detection of SARS-CoV-2 virus and/or diagnosis of COVID-19 infection  under section 564(b)(1) of the Act, 21 U.S.C. [id number]) (1), unless the authorization is terminated or revoked sooner. When diagnostic testing is negative, the possibility of a false negative result should be considered in the context of a patient's recent exposures and the presence of clinical signs and symptoms consistent with COVID-19. An individual without symptoms  of COVID-19 and who is not shedding SARS-CoV-2 virus would expect to have a negative (not detected) result in this assay.   Blood culture (routine x 2)     Status: None (Preliminary result)   Collection Time: 08/31/19  8:44 PM   Specimen: BLOOD LEFT HAND  Result Value Ref Range Status   Specimen Description BLOOD LEFT HAND  Final   Special Requests   Final    BOTTLES DRAWN AEROBIC ONLY Blood Culture adequate volume   Culture   Final    NO GROWTH 2 DAYS Performed at [facility], [address] [place], [place] [address]    Report Status PENDING  Incomplete  Culture, blood (Routine X 2) w Reflex to ID Panel     Status: None (Preliminary result)   Collection Time: 08/31/19 10:53 PM   Specimen: BLOOD  Result Value Ref Range Status   Specimen Description BLOOD RIGHT ANTECUBITAL  Final   Special Requests   Final    BOTTLES DRAWN AEROBIC AND ANAEROBIC Blood Culture results may not be optimal due to an excessive volume of blood received in culture bottles   Culture   Final    NO GROWTH 1 DAY Performed at [facility], [place] [address] [place], [place] [address]    Report Status PENDING  Incomplete     Labs: BNP (last 3 results) No results for input(s): BNP in the last 8760 hours. Basic Metabolic Panel: Recent Labs  Lab 08/31/19 2031 09/01/19 0215 09/01/19 0444  NA 136 138  --   K 3.9 3.5  --   CL 110 112*  --   CO2 16* 17*  --   GLUCOSE 174* 127*  --   BUN 15 17  --   CREATININE 1.04* 1.14* 1.08*  CALCIUM 8.9 8.7*  --    Liver Function Tests: Recent Labs  Lab 08/31/19 2031 09/01/19 0215   AST 64* 52*  ALT 67* 61*  ALKPHOS 247* 229*  BILITOT 0.5 0.7  PROT 6.6 6.1*  ALBUMIN 3.4* 3.2*   Recent Labs  Lab 08/31/19 2031  LIPASE 26   No results for input(s): AMMONIA in the last 168 hours. CBC: Recent Labs  Lab 08/31/19 2031 09/01/19 0215 09/01/19 0444  WBC 9.4 8.8 8.6  NEUTROABS  --  7.8*  --   HGB 14.1 13.4 13.5  HCT 43.4 40.4 40.9  MCV 91.4 90.6 90.9  PLT 130* 118* 115*   Cardiac Enzymes: No results for input(s): CKTOTAL, CKMB, CKMBINDEX, TROPONINI in the last 168 hours. BNP: Invalid input(s): POCBNP CBG: No results for input(s): GLUCAP in the last 168 hours. D-Dimer Recent Labs    09/01/19 0215  DDIMER 0.31   Hgb A1c Recent Labs    09/01/19 0215  HGBA1C 5.8*   Lipid Profile No results for input(s): CHOL, HDL, LDLCALC, TRIG, CHOLHDL, LDLDIRECT in the last 72 hours. Thyroid function studies No results for input(s): TSH, T4TOTAL, T3FREE, THYROIDAB in the last 72 hours.  Invalid input(s): FREET3 Anemia work up Recent Labs    09/01/19 0215  FERRITIN 582*   Urinalysis    Component Value Date/Time   COLORURINE YELLOW 09/01/2019 [place] 09/01/2019 1530   LABSPEC 1.023 09/01/2019 1530   PHURINE 6.0 09/01/2019 1530   GLUCOSEU >=500 (A) 09/01/2019 1530   HGBUR LARGE (A) 09/01/2019 1530   BILIRUBINUR NEGATIVE 09/01/2019 1530   KETONESUR NEGATIVE 09/01/2019 1530   PROTEINUR NEGATIVE 09/01/2019 1530   UROBILINOGEN 0.2 12/09/2013 1556   NITRITE NEGATIVE 09/01/2019 1530   LEUKOCYTESUR NEGATIVE 09/01/2019 1530   Sepsis Labs Invalid input(s): PROCALCITONIN,  WBC,  LACTICIDVEN Microbiology Recent Results (from the past 240 hour(s))  Novel Coronavirus, NAA ([facility])     Status: Abnormal   Collection Time: 08/26/19  6:08 PM   Specimen: Nasopharyngeal(NP) swabs in vial transport medium   NASOPHARYNGE  TESTING  Result Value Ref Range Status   SARS-CoV-2, NAA Detected (A) Not Detected Final    Comment: Testing was performed  using the cobas(R) SARS-CoV-2 test. This nucleic acid amplification test was developed and its performance characteristics determined by [employer]. Nucleic acid amplification tests include PCR and TMA. This test has not been FDA cleared or approved. This test has been authorized by FDA under an Emergency Use Authorization (EUA). This test is only authorized for the duration of time the declaration that circumstances exist justifying the authorization of the emergency use of in vitro diagnostic tests for detection of SARS-CoV-2 virus and/or diagnosis of COVID-19 infection under section 564(b)(1) of the Act, 21 U.S.C. [id number]) (1), unless the authorization is terminated or revoked sooner. When diagnostic testing is negative, the possibility of a false negative result should be considered in the context of a patient's recent exposures and the presence of clinical signs and symptoms consistent with COVID-19. An individual without symptoms  of COVID-19 and who is not shedding SARS-CoV-2 virus would expect to have a negative (not detected) result in this assay.   Blood culture (routine x 2)     Status: None (Preliminary result)   Collection Time: 08/31/19  8:44 PM   Specimen: BLOOD LEFT HAND  Result Value Ref Range Status   Specimen Description BLOOD LEFT HAND  Final   Special Requests   Final    BOTTLES DRAWN AEROBIC ONLY Blood Culture adequate volume   Culture   Final    NO GROWTH 2 DAYS Performed at [facility], [address], [place], [place] [address]    Report Status PENDING  Incomplete  Culture, blood (Routine X 2) w Reflex to ID Panel     Status: None (Preliminary result)   Collection Time: 08/31/19 10:53 PM   Specimen: BLOOD  Result Value Ref Range Status   Specimen Description BLOOD RIGHT ANTECUBITAL  Final   Special  Requests   Final    BOTTLES DRAWN AEROBIC AND ANAEROBIC Blood Culture results may not be optimal due to an excessive volume of blood  received in culture bottles   Culture   Final    NO GROWTH 1 DAY Performed at [facility] [address], [place], [place] [address]    Report Status PENDING  Incomplete     Time coordinating discharge: Over 30 minutes  SIGNED:   Little Ishikawa, DO [facility] 09/02/2019, 11:34 AM Pager   If 7PM-7AM, please contact night-coverage [email or web address] Password [email or web address]

## 2019-09-02 NOTE — Progress Notes (Signed)
Patient scheduled for outpatient Remdesivir infusion at 11:30 on Wednesday, 1/20, Thursday, 1/21, and Friday 1/22.  Please advise them to report to [facility] at [address]  Drive to the security guard and tell them you are here for an infusion. They will direct you to the front entrance where we will come and get you.  For questions call [phone].  Thanks

## 2019-09-02 NOTE — Progress Notes (Signed)
Discharge instructions given with help of interpreter. IV removed. Home meds retrieved from pharmacy and given back to patient.  Patient taken car.

## 2019-09-02 NOTE — Progress Notes (Signed)
Pt has had no complaints/needs this shift.  Used translator service to explain patient medications.  Pt did not understand why she only got her regular meds tonight and no others r/t to Covid, was able to explain that medications were only given once/day.  Pt accepted teaching, no other concerns.

## 2019-09-02 NOTE — Discharge Instructions (Addendum)
COVID-52 COVID-19 El COVID-19 es una infeccin respiratoria causada por un virus llamado coronavirus tipo 2 causante del sndrome respiratorio agudo grave (SARS-CoV-2). La enfermedad tambin se conoce como enfermedad por coronavirus o nuevo coronavirus. En algunas personas, el virus puede no ocasionar sntomas. En otras, puede producir una infeccin grave. La infeccin puede empeorar rpidamente y causar complicaciones, como:  Neumona o infeccin en los pulmones.  Sndrome de dificultad respiratoria aguda o SDRA. Es una afeccin que se caracteriza por la acumulacin de lquido en los pulmones, que impide que los pulmones se llenen de aire y pasen oxgeno a Herbalist.  Insuficiencia respiratoria aguda. Es una afeccin que se caracteriza porque no pasa suficiente oxgeno de los pulmones al cuerpo o porque el dixido de carbono no pasa de los pulmones hacia afuera del cuerpo.  Sepsis o choque sptico. Se trata de una reaccin grave del cuerpo ante una infeccin.  Problemas de coagulacin.  Infecciones secundarias debido a bacterias u hongos.  Falla de rganos. Ocurre cuando los rganos del cuerpo dejan de funcionar. El virus que causa el COVID-19 es contagioso. Esto significa que puede transmitirse de [place] persona a otra a travs de las gotitas de saliva de la tos y de los estornudos (secreciones respiratorias). Cules son las causas? Esta enfermedad es causada por un virus. Usted puede contagiarse con este virus:  Al inspirar las gotitas de una persona infectada. Las [employer] pueden diseminarse cuando una persona respira, habla, canta, tose o estornuda.  Al tocar algo, como una mesa o el picaportes de [place], que estuvo expuesto al virus (contaminado) y luego tocarse la boca, nariz o los ojos. Qu incrementa el riesgo? Riesgo de infeccin Es ms probable que se infecte con este virus si:  Se encuentra a Physiological scientist a 6 pies (2 metros) de Arts administrator con COVID-19.  Cuida o  vive con una persona infectada con COVID-19.  Pasa tiempo en espacios interiores repletos de gente o vive en viviendas compartidas. Riesgo de enfermedad grave Es ms probable que se enferme gravemente por el virus si:  Tiene 50aos o ms. Cuanto mayor sea su edad, mayor ser el riesgo de tener una enfermedad grave.  Vive en un hogar de ancianos o centro de atencin a Barrister's clerk.  Tiene cncer.  Tiene una enfermedad prolongada (crnica), como las siguientes: ? Enfermedad pulmonar crnica, que incluye la enfermedad pulmonar obstructiva crnica o asma. ? Una enfermedad crnica que disminuye la capacidad del cuerpo para combatir las infecciones (immunocomprometido). ? Enfermedad cardaca, que incluye insuficiencia cardaca, una afeccin que se caracteriza porque las arterias que llegan al corazn se Investment banker, corporate u obstruyen (arteriopata coronaria) o una enfermedad que provoca que el msculo cardaco se engrose, se debilite o endurezca (miocardiopata). ? Diabetes. ? Enfermedad renal crnica. ? Anemia drepanoctica, una enfermedad que se caracteriza porque los glbulos rojos tienen una forma anormal de "hoz". ? Enfermedad heptica.  Es obeso. Cules son los signos o sntomas? Los sntomas de esta afeccin pueden ser de leves a graves. Los sntomas pueden aparecer en el trmino de 2 a [address] despus de haber estado expuesto al virus. Incluyen los siguientes:  Fiebre o escalofros.  Tos.  Dificultad para respirar.  Dolores de [place], dolores en el cuerpo o dolores musculares.  Secrecin o congestin nasal.  Dolor de garganta.  Nueva prdida del sentido del gusto o del olfato. Algunas personas tambin pueden [employer], como nuseas, vmitos o diarrea. Es posible que otras personas no tengan sntomas de  COVID-19. Cmo se diagnostica? Esta afeccin se puede diagnosticar en funcin de lo siguiente:  Sus signos y sntomas, especialmente si: ? Vive en una zona  donde hay un brote de COVID-19. ? Viaj recientemente a una zona donde el virus es frecuente. ? Cuida o vive con Ardelia Mems persona a quien se le diagnostic COVID-19. ? [address] expuesto a una persona a la que se le diagnostic COVID-19.  Un examen fsico.  Anlisis de laboratorio que pueden incluir: ? Tomar una muestra de lquido de la parte posterior de la nariz y la garganta (lquido nasofarngeo), la nariz o la garganta, con un hisopo. ? Una muestra de mucosidad de los pulmones (esputo). ? Anlisis de [place].  Los estudios de diagnstico por imgenes pueden incluir radiografas, exploracin por tomografa computarizada (TC) o ecografa. Cmo se trata? En este momento, no hay ningn medicamento para tratar el COVID-19. Los medicamentos para tratar otras enfermedades se usan a modo de ensayo para comprobar si son eficaces contra el COVID-19. El mdico le informar sobre las maneras de tratar los sntomas. En la [employer], la infeccin es leve y puede controlarse en el hogar con reposo, lquidos y medicamentos de [place]. El tratamiento para una infeccin grave suele realizarse en la unidad de cuidados intensivos (UCI) de un hospital. Puede incluir uno o ms de los siguientes. Estos tratamientos se administran hasta que los sntomas mejoran.  Recibir lquidos y [employer] a travs de una va intravenosa.  Oxgeno complementario. Para administrar oxgeno extra, se [place] un tubo en la Doran [place], una mascarilla o una campana de oxgeno.  Colocarlo para que se recueste boca abajo (decbito prono). Esto facilita el ingreso de oxgeno a los pulmones.  Uso continuo de [place] de presin positiva de las vas areas (CPAP) o de presin positiva de las vas areas de dos niveles (BPAP). Este tratamiento utiliza una presin de aire leve para Theatre manager las vas respiratorias abiertas. Un tubo conectado a un motor administra oxgeno al cuerpo.  Respirador. Este tratamiento mueve el aire  dentro y fuera de los pulmones mediante el uso de un tubo que se coloca en la trquea.  Traqueostoma. En este procedimiento se hace un orificio en el cuello para insertar un tubo de respiracin.  Oxigenacin por membrana extracorprea (OMEC). En este procedimiento, los pulmones tienen la posibilidad de recuperarse al asumir las funciones del corazn y los pulmones. Suministra oxgeno al cuerpo y elimina el dixido de carbono. Siga estas instrucciones en su casa: Estilo de vida  Si est enfermo, qudese en su casa, excepto para obtener atencin mdica. El mdico le indicar cunto tiempo debe quedarse en casa. Llame al mdico antes de buscar atencin mdica.  Haga reposo en su casa como se lo haya indicado el mdico.  No consuma ningn producto que contenga nicotina o tabaco, como cigarrillos, cigarrillos electrnicos y tabaco de Higher education careers adviser. Si necesita ayuda para dejar de fumar, consulte al mdico.  Retome sus actividades normales segn lo indicado por el mdico. Pregntele al mdico qu actividades son seguras para usted. Instrucciones generales  Use los medicamentos de venta libre y los recetados solamente como se lo haya indicado el mdico.  Beba suficiente lquido como para Theatre manager la orina de color amarillo plido.  Concurra a todas las visitas de seguimiento como se lo haya indicado el mdico. Esto es importante. Cmo se evita?  No hay ninguna vacuna que ayude a prevenir la infeccin por COVID-19. Sin embargo, hay medidas que puede tomar para protegerse y Museum/gallery curator  a [name], television/film/video de este virus. Para protegerse:   No viaje a zonas donde el COVID-19 sea un riesgo. Las zonas donde se informa la presencia del COVID-19 cambian con frecuencia. Para identificar las zonas de alto riesgo y las restricciones de viaje, consulte el sitio web de viajes de [name] for [employer] and Prevention Librarian, academic) (Centros para el Control y la Prevencin de Arboriculturist):  FatFares.com.br  Si vive o debe viajar a una zona donde el COVID-19 es un riesgo, tome precauciones para evitar infecciones. ? Aljese de [place]. ? Lvese las manos frecuentemente con agua y [place]. Use desinfectante para manos con alcohol si no dispone de [place] y [place]. ? Evite tocarse la boca, la cara, los ojos o la [place]. ? Evite salir de su casa, siga las indicaciones de su estado y [place] autoridades sanitarias locales. ? Si debe salir de su casa, use un barbijo de tela o una mascarilla facial. Asegrese de que le cubra la nariz y la boca. ? Evite los espacios interiores repletos de gente. Mantenga una distancia de al menos 6 pies (2 metros) de [name], television/film/video. ? Desinfecte los objetos y las superficies que se tocan con frecuencia todos [place]. Pueden incluir:  Encimeras y [place].  Picaportes e interruptores de luz.  Lavabos, fregaderos y grifos.  Aparatos electrnicos tales como telfonos, controles remotos, teclados, computadoras y tabletas. Cmo proteger a los dems: Si tiene sntomas de COVID-19, tome medidas para evitar que el virus se propague a [name], television/film/video.  Si cree que tiene una infeccin por COVID-19, comunquese de inmediato con su mdico. Informe al equipo de atencin mdica que cree que puede tener una infeccin por el COVID-19.  Qudese en su casa. Salga de su casa solo para buscar atencin mdica. No utilice el transporte pblico.  No viaje mientras est enfermo.  Lvese las manos frecuentemente con agua y [place]. Usar desinfectante para manos con alcohol si no dispone de [place] y [place].  Mantngase alejado de quienes vivan con usted. Permita que los miembros de la familia sanos cuiden a los nios y las [place], si es posible. Si tiene que cuidar a los nios o las mascotas, lvese las manos con frecuencia y use un barbijo. Si es posible, permanezca en su habitacin, separado de los dems. Utilice un  bao diferente.  Asegrese de que todas las personas que viven en su casa se laven bien las manos y con frecuencia.  Tosa o estornude en un pauelo de papel o sobre su manga o codo. No tosa o estornude al aire ni se cubra la boca o la nariz con la [place].  Use un barbijo de tela o una mascarilla facial. Asegrese de que le cubra la nariz y la boca. Dnde buscar ms informacin  Centers for Disease Control and Prevention (Centros para el Control y la Prevencin de Arboriculturist): PurpleGadgets.be  World Health Organization (Organizacin Mundial de la Salud): https://www.castaneda.info/ Comunquese con un mdico si:  Vive o ha viajado a una zona donde el COVID-19 es un riesgo y tiene sntomas de infeccin.  Ha tenido contacto con alguien que tiene COVID-19 y usted tiene sntomas de infeccin. Solicite ayuda inmediatamente si:  Tiene dificultad para respirar.  Siente dolor u opresin en el pecho.  Experimenta confusin.  [place] uas de los dedos y los labios de color [place].  Tiene dificultad para despertarse.  Los sntomas empeoran. Estos sntomas pueden representar un problema grave que constituye Engineer, maintenance (IT). No  espere a ver si los sntomas desaparecen. Solicite atencin mdica de inmediato. Comunquese con el servicio de emergencias de su localidad (911 en los [place]). No conduzca por sus propios medios [employer] hospital. Informe al personal mdico de emergencias si cree que tiene COVID-19. Resumen  El COVID-19 es una infeccin respiratoria causada por un virus. Tambin se conoce como enfermedad por coronavirus o nuevo coronavirus. Puede causar infecciones graves, como neumona, sndrome de dificultad respiratoria aguda, insuficiencia respiratoria aguda o sepsis.  El virus que causa el COVID-19 es contagioso. Esto significa que puede transmitirse de [place] persona a otra a travs de las gotitas que se despiden al respirar, Electrical engineer,  cantar, toser y Brewing technologist.  Es ms probable que desarrolle una enfermedad grave si tiene 4 aos o ms, tiene el sistema inmunitario dbil, vive en un hogar de ancianos o tiene una enfermedad crnica.  No hay ningn medicamento para tratar el COVID-19. El mdico le informar sobre las maneras de tratar los sntomas.  Tome medidas para protegerse y Museum/gallery curator a los Location manager las infecciones. Lvese las manos con frecuencia y desinfecte los objetos y las superficies que se tocan con frecuencia todos [place]. Mantngase alejado de las personas que estn enfermas y use un barbijo si est enfermo. Esta informacin no tiene Marine scientist el consejo del mdico. Asegrese de hacerle al mdico cualquier pregunta que tenga. Document Revised: 06/05/2019 Document Reviewed: 09/28/2018 Elsevier Patient Education  2020 [place] are scheduled for an outpatient infusion of Remdesivir at 11:30 on Wednesday, 1/20, Thursday, 1/21, and Friday 1/22.  Please report to [facility] at [address]  Drive to the security guard and tell them you are here for an infusion. They will direct you to the front entrance where we will come and get you.  For questions call [phone].  Thanks

## 2019-09-03 ENCOUNTER — Ambulatory Visit (HOSPITAL_COMMUNITY)
Admission: RE | Admit: 2019-09-03 | Discharge: 2019-09-03 | Disposition: A | Discharge status: Home / Self Care | Payer: BLUE CROSS/BLUE SHIELD | Source: Ambulatory Visit | Attending: Pulmonary Disease | Admitting: Pulmonary Disease

## 2019-09-03 VITALS — BP 103/66 | HR 57 | Temp 97.9°F | Resp 18

## 2019-09-03 DIAGNOSIS — U071 COVID-19: Secondary | ICD-10-CM | POA: Insufficient documentation

## 2019-09-03 MED ORDER — SODIUM CHLORIDE 0.9 % IV SOLN: 100.0000 mg | Freq: Once | INTRAVENOUS | Status: AC

## 2019-09-03 MED ORDER — ALBUTEROL SULFATE HFA 108 (90 BASE) MCG/ACT IN AERS: 2.0000 | INHALATION_SPRAY | Freq: Once | RESPIRATORY_TRACT | Status: DC | PRN

## 2019-09-03 MED ORDER — EPINEPHRINE 0.3 MG/0.3ML IJ SOAJ: 0.3000 mg | Freq: Once | INTRAMUSCULAR | Status: DC | PRN

## 2019-09-03 MED ORDER — SODIUM CHLORIDE 0.9 % IV SOLN: INTRAVENOUS | Status: DC | PRN

## 2019-09-03 MED ORDER — METHYLPREDNISOLONE SODIUM SUCC 125 MG IJ SOLR: 125.0000 mg | Freq: Once | INTRAMUSCULAR | Status: DC | PRN

## 2019-09-03 MED ORDER — DIPHENHYDRAMINE HCL 50 MG/ML IJ SOLN: 50.0000 mg | Freq: Once | INTRAMUSCULAR | Status: DC | PRN

## 2019-09-03 MED ORDER — FAMOTIDINE IN NACL 20-0.9 MG/50ML-% IV SOLN: 20.0000 mg | Freq: Once | INTRAVENOUS | Status: DC | PRN

## 2019-09-03 MED ORDER — SODIUM CHLORIDE 0.9 % IV SOLN
INTRAVENOUS | Status: AC
  Administered 2019-09-03: 11:00:00 100 mg via INTRAVENOUS
  Filled 2019-09-03: qty 20

## 2019-09-03 NOTE — Progress Notes (Signed)
  Diagnosis: COVID-19  Physician: Dr.Wright  Procedure: [facility] Med: remdesivir infusion.  Complications: No immediate complications noted.  Discharge: Discharged home   Katie Lyons, Katie Lyons 09/03/2019

## 2019-09-03 NOTE — Discharge Instructions (Signed)
10 Things You Can Do to Manage Your COVID-19 Symptoms at Home If you have possible or confirmed COVID-19: 1. Stay home from work and school. And stay away from other public places. If you must go out, avoid using any kind of public transportation, ridesharing, or taxis. 2. Monitor your symptoms carefully. If your symptoms get worse, call your healthcare provider immediately. 3. Get rest and stay hydrated. 4. If you have a medical appointment, call the healthcare provider ahead of time and tell them that you have or may have COVID-19. 5. For medical emergencies, call 911 and notify the dispatch personnel that you have or may have COVID-19. 6. Cover your cough and sneezes with a tissue or use the inside of your elbow. 7. Wash your hands often with soap and water for at least 20 seconds or clean your hands with an alcohol-based hand sanitizer that contains at least 60% alcohol. 8. As much as possible, stay in a specific room and away from other people in your home. Also, you should use a separate bathroom, if available. If you need to be around other people in or outside of the home, wear a mask. 9. Avoid sharing personal items with other people in your household, like dishes, towels, and bedding. 10. Clean all surfaces that are touched often, like counters, tabletops, and doorknobs. Use household cleaning sprays or wipes according to the label instructions. cdc.gov/coronavirus 02/12/2019 This information is not intended to replace advice given to you by your health care provider. Make sure you discuss any questions you have with your health care provider. Document Revised: 07/17/2019 Document Reviewed: 07/17/2019 Elsevier Patient Education  2020 Elsevier Inc.  

## 2019-09-04 ENCOUNTER — Ambulatory Visit (HOSPITAL_COMMUNITY)
Admit: 2019-09-04 | Discharge: 2019-09-04 | Disposition: A | Discharge status: Home / Self Care | Payer: BLUE CROSS/BLUE SHIELD | Attending: Pulmonary Disease | Admitting: Pulmonary Disease

## 2019-09-04 DIAGNOSIS — U071 COVID-19: Secondary | ICD-10-CM

## 2019-09-04 MED ORDER — FAMOTIDINE IN NACL 20-0.9 MG/50ML-% IV SOLN: 20.0000 mg | Freq: Once | INTRAVENOUS | Status: DC | PRN

## 2019-09-04 MED ORDER — EPINEPHRINE 0.3 MG/0.3ML IJ SOAJ: 0.3000 mg | Freq: Once | INTRAMUSCULAR | Status: DC | PRN

## 2019-09-04 MED ORDER — SODIUM CHLORIDE 0.9 % IV SOLN
INTRAVENOUS | Status: AC
  Administered 2019-09-04: 11:00:00 100 mg via INTRAVENOUS
  Filled 2019-09-04: qty 20

## 2019-09-04 MED ORDER — SODIUM CHLORIDE 0.9 % IV SOLN: INTRAVENOUS | Status: DC | PRN

## 2019-09-04 MED ORDER — METHYLPREDNISOLONE SODIUM SUCC 125 MG IJ SOLR: 125.0000 mg | Freq: Once | INTRAMUSCULAR | Status: DC | PRN

## 2019-09-04 MED ORDER — DIPHENHYDRAMINE HCL 50 MG/ML IJ SOLN: 50.0000 mg | Freq: Once | INTRAMUSCULAR | Status: DC | PRN

## 2019-09-04 MED ORDER — SODIUM CHLORIDE 0.9 % IV SOLN: 100.0000 mg | Freq: Once | INTRAVENOUS | Status: AC

## 2019-09-04 MED ORDER — ALBUTEROL SULFATE HFA 108 (90 BASE) MCG/ACT IN AERS: 2.0000 | INHALATION_SPRAY | Freq: Once | RESPIRATORY_TRACT | Status: DC | PRN

## 2019-09-04 NOTE — Progress Notes (Signed)
  Diagnosis: COVID-19  Physician:  Dr. Wright Procedure: [facility] Med: remdesivir infusion.  Complications: No immediate complications noted.  Discharge: Discharged home   Ally Yow 09/04/2019   

## 2019-09-05 ENCOUNTER — Ambulatory Visit (HOSPITAL_COMMUNITY)
Admit: 2019-09-05 | Discharge: 2019-09-05 | Disposition: A | Discharge status: Home / Self Care | Payer: BLUE CROSS/BLUE SHIELD | Attending: Pulmonary Disease | Admitting: Pulmonary Disease

## 2019-09-05 DIAGNOSIS — U071 COVID-19: Secondary | ICD-10-CM | POA: Diagnosis not present

## 2019-09-05 LAB — CULTURE, BLOOD (ROUTINE X 2)
Culture: NO GROWTH
Special Requests: ADEQUATE

## 2019-09-05 MED ORDER — SODIUM CHLORIDE 0.9 % IV SOLN
100.0000 mg | Freq: Once | INTRAVENOUS | Status: AC
  Administered 2019-09-05: 11:00:00 100 mg via INTRAVENOUS

## 2019-09-05 MED ORDER — EPINEPHRINE 0.3 MG/0.3ML IJ SOAJ: 0.3000 mg | Freq: Once | INTRAMUSCULAR | Status: DC | PRN

## 2019-09-05 MED ORDER — SODIUM CHLORIDE 0.9 % IV SOLN
INTRAVENOUS | Status: DC | PRN
  Administered 2019-09-05: 250 mL via INTRAVENOUS

## 2019-09-05 MED ORDER — DIPHENHYDRAMINE HCL 50 MG/ML IJ SOLN: 50.0000 mg | Freq: Once | INTRAMUSCULAR | Status: DC | PRN

## 2019-09-05 MED ORDER — METHYLPREDNISOLONE SODIUM SUCC 125 MG IJ SOLR: 125.0000 mg | Freq: Once | INTRAMUSCULAR | Status: DC | PRN

## 2019-09-05 MED ORDER — SODIUM CHLORIDE 0.9 % IV SOLN
INTRAVENOUS | Status: AC
  Filled 2019-09-05: qty 20

## 2019-09-05 MED ORDER — ALBUTEROL SULFATE HFA 108 (90 BASE) MCG/ACT IN AERS: 2.0000 | INHALATION_SPRAY | Freq: Once | RESPIRATORY_TRACT | Status: DC | PRN

## 2019-09-05 MED ORDER — FAMOTIDINE IN NACL 20-0.9 MG/50ML-% IV SOLN: 20.0000 mg | Freq: Once | INTRAVENOUS | Status: DC | PRN

## 2019-09-05 NOTE — Progress Notes (Signed)
  Diagnosis: COVID-19  Physician: Dr. Joya Gaskins  Procedure: [facility] Med: remdesivir infusion.  Complications: No immediate complications noted.  Discharge: Discharged home   Katie Lyons 09/05/2019

## 2019-09-05 NOTE — Discharge Instructions (Signed)
Preguntas frecuentes sobre el COVID-19 COVID-19 Frequently Asked Questions El COVID-19 (enfermedad por coronavirus) es una infeccin causada por una gran familia de virus. Algunos virus causan [employer] y otros causan enfermedades en animales tales como los camellos, los gatos y los murcilagos. En algunos casos, los virus que causan [employer] pueden transmitirse a los seres humanos. De dnde provino el coronavirus? En diciembre de 2019, [place] le inform a Advertising account executive (Organizacin Mundial de la [place], Medical laboratory scientific officer) acerca de varios casos de enfermedad pulmonar (enfermedad respiratoria humana). Estos casos estaban vinculados con un mercado abierto de frutos de mar y [place] en [place]. El vnculo con el mercado de ganado y [employer] sugiere que el virus puede haberse propagado de los animales a los [place]. Sin embargo, desde Engineer, materials brote en diciembre, tambin se ha demostrado que el virus se contagia de [place] persona a Theatre manager. Cul es el nombre de la enfermedad y del virus? Nombre de la enfermedad Al principio, esta enfermedad se llam nuevo coronavirus. Esto se debe a que los cientficos determinaron que la enfermedad era causada por un nuevo virus respiratorio. [employer] (Organizacin Mundial de la [place], [place]) ahora ha dado a la enfermedad el nombre de COVID-19, o enfermedad por coronavirus. Nombre del virus El virus causante de la enfermedad se conoce como coronavirus de tipo 2 causante del sndrome respiratorio agudo grave (SARS-CoV-2). Ms informacin sobre el nombre de la enfermedad y el virus [facility] (Organizacin Mundial de la [place]) (OMS): www.who.int/emergencies/diseases/novel-coronavirus-2019/technical-guidance/naming-the-coronavirus-disease-(covid-2019)-and-the-virus-that-causes-it Quines estn en riesgo de sufrir complicaciones debido a la enfermedad por coronavirus? Algunas personas pueden  tener un riesgo ms alto de tener complicaciones debido a la enfermedad por coronavirus. Entre ellas se encuentran los [employer] y las personas que tienen enfermedades crnicas, como enfermedad cardaca, diabetes y enfermedad pulmonar. Si tiene un riesgo ms alto de Advice worker, tome estas precauciones adicionales:  Public affairs consultant en su casa todo lo que sea posible.  [place] reuniones sociales y los viajes.  Evitar el contacto cercano con [name], television/film/video. Permanecer a una distancia de al menos 6 pies (2 m) de las [employer] Pacific, si es posible.  Lavarse las manos frecuentemente con agua y [place] durante al menos 20segundos.  Evitar tocarse la cara, la boca, la nariz y los ojos.  Tener a [employer] su casa, como alimentos, medicamentos y productos de limpieza.  Si debe salir de su casa, use un barbijo de tela o una mascarilla facial. Asegrese de que le cubra la nariz y la boca. Cmo se transmite la enfermedad causada por el coronavirus? El virus que causa la enfermedad por coronavirus se transmite fcilmente de Ardelia Mems persona a otra (es contagioso). Usted puede contagiarse con este virus:  Al inspirar las gotitas de una persona infectada. Las [employer] pueden diseminarse cuando una persona respira, habla, canta, tose o estornuda.  Al tocar algo, como una mesa o el picaportes de [place], que estuvo expuesto al virus (contaminado) y luego tocarse la boca, nariz o los ojos. Puedo contraer al virus al tocar superficies u objetos? Todava hay mucho que no se conoce acerca del virus que causa la enfermedad por coronavirus. Los cientficos basan gran parte de la informacin en lo que saben sobre virus similares, por ejemplo:  En general, los virus no sobreviven en superficies durante mucho tiempo. Necesitan un cuerpo humano (husped) para sobrevivir.  Es ms probable que el virus se contagie por contacto cercano con  personas que estn enfermas (contacto directo), por  ejemplo: ? Al estrechar las manos o abrazarse. ? Al inhalar las gotitas respiratorias que se desplazan por el aire. Las [employer] Stores pueden diseminarse cuando una persona respira, habla, canta, tose o estornuda.  Es menos probable que el virus se propague cuando una persona toca una superficie o un objeto sobre el que est el virus (contacto indirecto). El virus puede ingresar al cuerpo si la persona toca una superficie o un objeto y [employer] se toca la cara, los ojos, la nariz o la boca. Una persona puede contagiar el virus sin tener sntomas de la enfermedad? Puede ser posible que el virus se contagie antes de que la persona tenga sntomas de la enfermedad, pero muy probablemente esta no sea la principal forma en que el virus se est propagando. Es ms probable que el virus se propague al estar en contacto estrecho con personas que estn enfermas e inhalar las gotas respiratorias que una persona disemina al respirar, Electrical engineer, cantar, toser o estornudar. Cules son los sntomas de la enfermedad causada por el coronavirus? Los sntomas varan de Ardelia Mems persona a otra y pueden variar de leves a graves. [employer], se pueden incluir los siguientes:  Systems analyst o escalofros.  Tos.  Dificultad para respirar o falta de aire.  Dolores de [place], dolores en el cuerpo o dolores musculares.  Secrecin o congestin nasal.  El dolor de garganta.  Nueva prdida del sentido del gusto o del olfato.  Nuseas, vmitos o diarrea. Estos sntomas pueden aparecer en el trmino de 2 a [address] despus de haber estado expuesto al virus. Algunas personas quizs no tengan sntomas. Si presenta sntomas, llame al mdico. Las personas con sntomas graves pueden necesitar atencin hospitalaria. Debo hacerme un anlisis de deteccin del virus? El mdico decidir si debe realizarse un anlisis en funcin de sus sntomas, antecedentes de exposicin y factores de [place]. Cmo realiza el mdico el anlisis para detectar  este virus? Los mdicos obtienen muestras para enviar a Physiological scientist. Estas muestras pueden incluir lo siguiente:  Tomar con un hisopo Truddie Coco de lquido de la parte posterior de la nariz y la garganta, la nariz o la garganta.  Pedirle que tosa mucosidad (esputo) para extraer lquido de los pulmones en un recipiente estril.  Tomar una muestra de [place]. Hay algn tratamiento o vacuna para este virus? Actualmente, no existe ninguna vacuna para prevenir la enfermedad por coronavirus. Adems, no existen [employer] antibiticos o los antivirales para tratar el virus. Una persona que se enferma recibe tratamiento de apoyo, lo que significa reposo y lquidos. Una persona tambin puede aliviar sus sntomas con medicamentos de venta libre para tratar los estornudos, la tos y el goteo nasal. Son los mismos medicamentos que se toman para el resfro comn. Si presenta sntomas, llame al mdico. Las personas con sntomas graves pueden necesitar atencin hospitalaria. Qu puedo hacer para protegerme y proteger a mi familia de este virus?     Puede protegerse y proteger a su familia tomando las mismas medidas que tomara para prevenir el contagio de otros virus. [employer] las siguientes medidas:  Lavarse las manos frecuentemente con agua y [place] durante al menos 20segundos. Usar desinfectante para manos con alcohol si no dispone de [place] y [place].  Evitar tocarse la cara, la boca, la nariz y los ojos.  Toser o estornudar en un pauelo descartable, sobre su manga o codo. No toser o estornudar al aire ni cubrirse con la [place]. ? Si tose  o estornuda en un pauelo de papel, deschelo inmediatamente y [employer].  Desinfectar los [employer] y las superficies que se tocan con frecuencia todos [place].  Aljese de [place].  Evite salir de su casa, siga las indicaciones de su estado y [place] autoridades sanitarias locales.  Evite los espacios interiores repletos de gente. Permanezca  a una distancia de al menos 6 pies (2 m) de las Standard Pacific.  Si debe salir de su casa, use un barbijo de tela o una mascarilla facial. Asegrese de que le cubra la nariz y la boca.  Jimmye Norman en su casa si est enfermo, excepto para obtener atencin mdica. Llame al mdico antes de buscar atencin mdica. El mdico le indicar cunto tiempo debe quedarse en casa.  Asegrese de [place] vacunas al da. Pregntele al mdico qu vacunas necesita. Qu debo hacer si tengo que viajar? Siga las recomendaciones relacionadas con los viajes de la autoridad de Arboriculturist, los CDC y Heritage manager. Informacin y consejos para Archivist for Disease Control and Prevention Librarian, academic) (Centros para el Control y la Prevencin de Arboriculturist): BodyEditor.hu  Organizacin [place] (OMS): ThirdIncome.ca [employer] riesgos y tome medidas para proteger su salud  El riesgo de Museum/gallery curator la enfermedad por coronavirus es ms alto si viaja a zonas con un brote o si est en contacto con viajeros que provienen de zonas donde hay un brote.  Lvese las manos con frecuencia y [place] higiene [place] para reducir el riesgo de contagiarse o transmitir el virus. Qu debo hacer si estoy enfermo? Instrucciones generales para detener la propagacin de la infeccin  Lavarse las manos frecuentemente con agua y jabn durante al menos 20segundos. Usar desinfectante para manos con alcohol si no dispone de [place] y [place].  Toser o estornudar en un pauelo descartable, sobre su manga o codo. No toser o estornudar al aire ni cubrirse con la [place].  Si tose o estornuda en un pauelo de papel, deschelo inmediatamente y [employer].  Foy [place] en su casa a menos que deba recibir [employer] Corporation. Llame al mdico o a la autoridad de salud local antes de buscar atencin mdica.  Evite las zonas pblicas. No viaje en  transporte pblico, de ser posible.  Si puede, use un barbijo si debe salir de la casa o si est en contacto cercano con alguien que no est enfermo. Asegrese de que le cubra la nariz y la boca. Mantenga su casa limpia  Desinfecte los objetos y las superficies que se tocan con frecuencia todos [place]. Pueden incluir: ? Encimeras y [place]. ? Picaportes e interruptores de luz. ? Lavabos, fregaderos y grifos. ? Aparatos electrnicos tales como telfonos, controles remotos, teclados, computadoras y tabletas.  Lave los platos con agua jabonosa caliente o en el lavavajillas. Deje los platos para que se sequen al aire.  Lave la ropa con agua caliente. Evite infectar a otros miembros de la familia  Permita que los miembros de la familia sanos cuiden a los nios y las [place], si es posible. Si tiene que cuidar a los nios o las mascotas, lvese las manos con frecuencia y use un barbijo.  Duerma en una habitacin o cama diferentes, si es posible.  No comparta elementos personales, como afeitadoras, cepillos de dientes, desodorantes, peines, cepillos, toallas y toallitas de mano. Dnde buscar ms informacin Centers for Disease Control and Prevention (CDC)  Actualizaciones de informacin y novedades: https://www.butler-gonzalez.com/ Organizacin Mundial de la Salud (OMS)  Actualizaciones de informacin y novedades: MissExecutive.com.ee  Tema de salud relacionado con el coronavirus: https://www.castaneda.info/  Preguntas y [employer] COVID-19: OpportunityDebt.at  Registro mundial: who.sprinklr.com American Academy of Pediatrics (AAP) ([place])  Informacin para familias: www.healthychildren.org/English/health-issues/conditions/chest-lungs/Pages/2019-Novel-Coronavirus.aspx La situacin del coronavirus cambia rpidamente. Consulte el sitio web de su autoridad de Arboriculturist o  los sitios web de los CDC y la OMS para enterarse [place] novedades y noticias. Cundo debo comunicarme con un mdico?  Comunquese con su mdico si tiene sntomas de infeccin, como fiebre o tos, y: ? [place] de alguien que sabe que tiene la enfermedad por coronavirus. ? Devota Pace en contacto con una persona que presuntamente sufra de la enfermedad por coronavirus. ? Ha viajado a una zona donde hay un brote de COVID-19. Cundo debo buscar asistencia mdica inmediata?  Busque ayuda de inmediato llamando al servicio de emergencias de su localidad (911 en los [place]) si tiene lo siguiente: ? Dificultad para respirar. ? Dolor u opresin en el pecho. ? Confusin. ? Labios y uas de [employer]. ? Dificultad para despertarse. ? Sntomas que empeoran. Informe al personal mdico de emergencias si cree que tiene la enfermedad por coronavirus. Resumen  Un nuevo virus respiratorio se propaga de Ardelia Mems persona a otra y causa COVID-19 (enfermedad por coronavirus).  El virus que causa el COVID-19 parece diseminarse fcilmente. Se transmite de [place] persona a otra a travs de las [employer] se despiden al respirar, Electrical engineer, cantar, toser o estornudar.  Los [employer] Corporation y las personas que tienen enfermedades crnicas tienen mayor riesgo de Emergency planning/management officer enfermedad. Si tiene un riesgo ms alto de tener complicaciones, tome Geophysical data processor.  Actualmente, no existe ninguna vacuna para prevenir la enfermedad por coronavirus. No existen medicamentos, como los antibiticos o los antivirales, para tratar el virus.  Puede protegerse y proteger a su familia al lavarse las manos con frecuencia, evitar tocarse la cara y cubrirse al toser y Brewing technologist. Esta informacin no tiene Marine scientist el consejo del mdico. Asegrese de hacerle al mdico cualquier pregunta que tenga. Document Revised: 06/05/2019 Document Reviewed: 12/09/2018 Elsevier Patient Education  [place].

## 2019-09-06 LAB — CULTURE, BLOOD (ROUTINE X 2): Culture: NO GROWTH

## 2019-09-23 ENCOUNTER — Other Ambulatory Visit: Payer: Self-pay

## 2019-09-29 ENCOUNTER — Other Ambulatory Visit: Payer: Self-pay | Admitting: Internal Medicine

## 2020-03-29 ENCOUNTER — Ambulatory Visit (INDEPENDENT_AMBULATORY_CARE_PROVIDER_SITE_OTHER): Payer: Self-pay

## 2020-03-29 DIAGNOSIS — Z23 Encounter for immunization: Secondary | ICD-10-CM

## 2020-04-20 ENCOUNTER — Other Ambulatory Visit: Payer: Self-pay

## 2020-04-20 ENCOUNTER — Ambulatory Visit (INDEPENDENT_AMBULATORY_CARE_PROVIDER_SITE_OTHER): Payer: Self-pay

## 2020-04-20 VITALS — Wt 131.5 lb

## 2020-04-20 DIAGNOSIS — Z23 Encounter for immunization: Secondary | ICD-10-CM

## 2020-04-20 NOTE — Progress Notes (Signed)
   Covid-19 Vaccination Clinic  Name:  Taylar Hartsough    MRN: [id number] DOB: 04/30/1986  04/20/2020  Ms. Joan Mayans Hilario was observed post Covid-19 immunization for 15 minutes without incident. She was provided with Vaccine Information Sheet and instruction to access the V-Safe system.   Ms. Derenda Fennel was instructed to call 911 with any severe reactions post vaccine: Marland Kitchen Difficulty breathing  . Swelling of face and throat  . A fast heartbeat  . A bad rash all over body  . Dizziness and weakness   Immunizations Administered    Name Date Dose VIS Date Route   Pfizer COVID-19 Vaccine 04/20/2020  4:02 PM 0.3 mL 10/08/2018 Intramuscular   Manufacturer: Coca[employer]Cola, [employer]   Lot: [id number]   [place]: [id number]

## 2020-07-30 NOTE — Progress Notes (Signed)
   Covid-19 Vaccination Clinic  Name:  Larena Ohnemus    MRN: [id number] DOB: 06/01/1986  07/30/2020  Ms. Joan Mayans Hilario was observed post Covid-19 immunization for 15 minutes without incident. She was provided with Vaccine Information Sheet and instruction to access the V-Safe system.   Ms. Derenda Fennel was instructed to call 911 with any severe reactions post vaccine: Marland Kitchen Difficulty breathing  . Swelling of face and throat  . A fast heartbeat  . A bad rash all over body  . Dizziness and weakness   Immunizations Administered    Name Date Dose VIS Date Route   Pfizer COVID-19 Vaccine 03/29/2020  2:35 PM 0.3 mL 10/08/2018 Intramuscular   Manufacturer: Coca[employer]Cola, [employer]   Lot: [id number]   [place]: [id number]

## 2020-09-15 DIAGNOSIS — H8112 Benign paroxysmal vertigo, left ear: Secondary | ICD-10-CM | POA: Insufficient documentation

## 2021-01-21 ENCOUNTER — Inpatient Hospital Stay (HOSPITAL_COMMUNITY)
Admission: EM | Admit: 2021-01-21 | Discharge: 2021-01-24 | DRG: 699 | Disposition: A | Discharge status: Home / Self Care | Payer: BLUE CROSS/BLUE SHIELD | Attending: Internal Medicine | Admitting: Internal Medicine

## 2021-01-21 ENCOUNTER — Other Ambulatory Visit: Payer: Self-pay

## 2021-01-21 DIAGNOSIS — T8613 Kidney transplant infection: Principal | ICD-10-CM | POA: Diagnosis present

## 2021-01-21 DIAGNOSIS — R509 Fever, unspecified: Secondary | ICD-10-CM

## 2021-01-21 DIAGNOSIS — Z87448 Personal history of other diseases of urinary system: Secondary | ICD-10-CM

## 2021-01-21 DIAGNOSIS — R102 Pelvic and perineal pain: Secondary | ICD-10-CM | POA: Diagnosis present

## 2021-01-21 DIAGNOSIS — Y830 Surgical operation with transplant of whole organ as the cause of abnormal reaction of the patient, or of later complication, without mention of misadventure at the time of the procedure: Secondary | ICD-10-CM | POA: Diagnosis present

## 2021-01-21 DIAGNOSIS — N12 Tubulo-interstitial nephritis, not specified as acute or chronic: Secondary | ICD-10-CM | POA: Diagnosis present

## 2021-01-21 DIAGNOSIS — I10 Essential (primary) hypertension: Secondary | ICD-10-CM | POA: Diagnosis present

## 2021-01-21 DIAGNOSIS — I700 Atherosclerosis of aorta: Secondary | ICD-10-CM | POA: Diagnosis present

## 2021-01-21 DIAGNOSIS — Z20822 Contact with and (suspected) exposure to covid-19: Secondary | ICD-10-CM | POA: Diagnosis present

## 2021-01-21 DIAGNOSIS — F419 Anxiety disorder, unspecified: Secondary | ICD-10-CM | POA: Diagnosis present

## 2021-01-21 DIAGNOSIS — I259 Chronic ischemic heart disease, unspecified: Secondary | ICD-10-CM | POA: Diagnosis present

## 2021-01-21 DIAGNOSIS — Z792 Long term (current) use of antibiotics: Secondary | ICD-10-CM

## 2021-01-21 DIAGNOSIS — D849 Immunodeficiency, unspecified: Secondary | ICD-10-CM | POA: Diagnosis present

## 2021-01-21 DIAGNOSIS — N2581 Secondary hyperparathyroidism of renal origin: Secondary | ICD-10-CM | POA: Diagnosis present

## 2021-01-21 DIAGNOSIS — R519 Headache, unspecified: Secondary | ICD-10-CM | POA: Diagnosis present

## 2021-01-21 DIAGNOSIS — Z79899 Other long term (current) drug therapy: Secondary | ICD-10-CM

## 2021-01-21 DIAGNOSIS — Z7952 Long term (current) use of systemic steroids: Secondary | ICD-10-CM

## 2021-01-21 DIAGNOSIS — Z940 Kidney transplant status: Secondary | ICD-10-CM

## 2021-01-21 DIAGNOSIS — Z8616 Personal history of COVID-19: Secondary | ICD-10-CM

## 2021-01-21 DIAGNOSIS — F32A Depression, unspecified: Secondary | ICD-10-CM | POA: Diagnosis present

## 2021-01-21 DIAGNOSIS — R6510 Systemic inflammatory response syndrome (SIRS) of non-infectious origin without acute organ dysfunction: Secondary | ICD-10-CM

## 2021-01-21 LAB — CBC WITH DIFFERENTIAL/PLATELET
Abs Immature Granulocytes: 0.06 10*3/uL (ref 0.00–0.07)
Basophils Absolute: 0 10*3/uL (ref 0.0–0.1)
Basophils Relative: 0 %
Eosinophils Absolute: 0 10*3/uL (ref 0.0–0.5)
Eosinophils Relative: 0 %
HCT: 42.4 % (ref 36.0–46.0)
Hemoglobin: 13.8 g/dL (ref 12.0–15.0)
Immature Granulocytes: 0 %
Lymphocytes Relative: 7 %
Lymphs Abs: 1.1 10*3/uL (ref 0.7–4.0)
MCH: 28.8 pg (ref 26.0–34.0)
MCHC: 32.5 g/dL (ref 30.0–36.0)
MCV: 88.3 fL (ref 80.0–100.0)
Monocytes Absolute: 0.9 10*3/uL (ref 0.1–1.0)
Monocytes Relative: 6 %
Neutro Abs: 12.9 10*3/uL — ABNORMAL HIGH (ref 1.7–7.7)
Neutrophils Relative %: 87 %
Platelets: 168 10*3/uL (ref 150–400)
RBC: 4.8 MIL/uL (ref 3.87–5.11)
RDW: 13.4 % (ref 11.5–15.5)
WBC: 14.9 10*3/uL — ABNORMAL HIGH (ref 4.0–10.5)
nRBC: 0 % (ref 0.0–0.2)

## 2021-01-21 LAB — I-STAT BETA HCG BLOOD, ED (MC, WL, AP ONLY): I-stat hCG, quantitative: <5 m[IU]/mL (ref <5)

## 2021-01-21 LAB — URINALYSIS, ROUTINE W REFLEX MICROSCOPIC
Bilirubin Urine: NEGATIVE
Glucose, UA: NEGATIVE mg/dL
Hgb urine dipstick: NEGATIVE
Ketones, ur: 20 mg/dL — AB
Nitrite: NEGATIVE
Protein, ur: 100 mg/dL — AB
Specific Gravity, Urine: 1.023 (ref 1.005–1.030)
pH: 5 (ref 5.0–8.0)

## 2021-01-21 LAB — BASIC METABOLIC PANEL
Anion gap: 11 (ref 5–15)
BUN: 10 mg/dL (ref 6–20)
CO2: 18 mmol/L — ABNORMAL LOW (ref 22–32)
Calcium: 9.3 mg/dL (ref 8.9–10.3)
Chloride: 104 mmol/L (ref 98–111)
Creatinine, Ser: 0.9 mg/dL (ref 0.44–1.00)
GFR, Estimated: >60 mL/min (ref >60)
Glucose, Bld: 99 mg/dL (ref 70–99)
Potassium: 3.6 mmol/L (ref 3.5–5.1)
Sodium: 133 mmol/L — ABNORMAL LOW (ref 135–145)

## 2021-01-21 MED ORDER — ACETAMINOPHEN 500 MG PO TABS
500.0000 mg | ORAL_TABLET | Freq: Once | ORAL | Status: AC
  Administered 2021-01-21: 500 mg via ORAL
  Filled 2021-01-21: qty 1

## 2021-01-21 NOTE — ED Provider Notes (Addendum)
Emergency Medicine Provider Triage Evaluation Note  Katie Lyons , a 35 y.o. female  was evaluated in triage.  Pt complains of dysuria, frequency, fever. Hx renal transplant  Review of Systems  Positive: Dysuria, frequency, fever Negative: vomiting  Physical Exam  BP 126/83 (BP Location: Right Arm)   Pulse 92   Temp (!) 100.7 F (38.2 C) (Oral)   Resp 14   SpO2 100%  Gen:   Awake, no distress   Resp:  Normal effort  MSK:   Moves extremities without difficulty  Medical Decision Making  Medically screening exam initiated at 6:33 PM.  Appropriate orders placed.  Katie Lyons was informed that the remainder of the evaluation will be completed by another provider, this initial triage assessment does not replace that evaluation, and the importance of remaining in the ED until their evaluation is complete.   Rodney Booze, PA-C 01/21/21 1833    Rodney Booze, PA-C 01/21/21 1842    Noemi Chapel, MD 01/22/21 1453

## 2021-01-21 NOTE — ED Triage Notes (Addendum)
Pt reports burning with urination and fever since last night and cough, nasal congestion today. Hx kidney transplant three years ago.

## 2021-01-22 ENCOUNTER — Inpatient Hospital Stay (HOSPITAL_COMMUNITY): Payer: BLUE CROSS/BLUE SHIELD

## 2021-01-22 ENCOUNTER — Emergency Department (HOSPITAL_COMMUNITY): Payer: BLUE CROSS/BLUE SHIELD

## 2021-01-22 DIAGNOSIS — Z79899 Other long term (current) drug therapy: Secondary | ICD-10-CM | POA: Diagnosis not present

## 2021-01-22 DIAGNOSIS — N10 Acute tubulo-interstitial nephritis: Secondary | ICD-10-CM | POA: Diagnosis not present

## 2021-01-22 DIAGNOSIS — R102 Pelvic and perineal pain: Secondary | ICD-10-CM | POA: Diagnosis present

## 2021-01-22 DIAGNOSIS — F419 Anxiety disorder, unspecified: Secondary | ICD-10-CM | POA: Diagnosis present

## 2021-01-22 DIAGNOSIS — N136 Pyonephrosis: Secondary | ICD-10-CM | POA: Diagnosis not present

## 2021-01-22 DIAGNOSIS — I10 Essential (primary) hypertension: Secondary | ICD-10-CM | POA: Diagnosis present

## 2021-01-22 DIAGNOSIS — Z7952 Long term (current) use of systemic steroids: Secondary | ICD-10-CM | POA: Diagnosis not present

## 2021-01-22 DIAGNOSIS — N2581 Secondary hyperparathyroidism of renal origin: Secondary | ICD-10-CM | POA: Diagnosis present

## 2021-01-22 DIAGNOSIS — Z87448 Personal history of other diseases of urinary system: Secondary | ICD-10-CM | POA: Diagnosis not present

## 2021-01-22 DIAGNOSIS — R519 Headache, unspecified: Secondary | ICD-10-CM | POA: Diagnosis present

## 2021-01-22 DIAGNOSIS — Z8616 Personal history of COVID-19: Secondary | ICD-10-CM | POA: Diagnosis not present

## 2021-01-22 DIAGNOSIS — Z940 Kidney transplant status: Secondary | ICD-10-CM | POA: Diagnosis not present

## 2021-01-22 DIAGNOSIS — I259 Chronic ischemic heart disease, unspecified: Secondary | ICD-10-CM | POA: Diagnosis present

## 2021-01-22 DIAGNOSIS — Z20822 Contact with and (suspected) exposure to covid-19: Secondary | ICD-10-CM | POA: Diagnosis present

## 2021-01-22 DIAGNOSIS — F32A Depression, unspecified: Secondary | ICD-10-CM | POA: Diagnosis present

## 2021-01-22 DIAGNOSIS — N12 Tubulo-interstitial nephritis, not specified as acute or chronic: Secondary | ICD-10-CM | POA: Diagnosis present

## 2021-01-22 DIAGNOSIS — I700 Atherosclerosis of aorta: Secondary | ICD-10-CM | POA: Diagnosis present

## 2021-01-22 DIAGNOSIS — T8613 Kidney transplant infection: Secondary | ICD-10-CM | POA: Diagnosis present

## 2021-01-22 DIAGNOSIS — D849 Immunodeficiency, unspecified: Secondary | ICD-10-CM | POA: Diagnosis present

## 2021-01-22 DIAGNOSIS — Z792 Long term (current) use of antibiotics: Secondary | ICD-10-CM | POA: Diagnosis not present

## 2021-01-22 DIAGNOSIS — Y830 Surgical operation with transplant of whole organ as the cause of abnormal reaction of the patient, or of later complication, without mention of misadventure at the time of the procedure: Secondary | ICD-10-CM | POA: Diagnosis present

## 2021-01-22 LAB — LACTIC ACID, PLASMA
Lactic Acid, Venous: 1 mmol/L (ref 0.5–1.9)
Lactic Acid, Venous: 1 mmol/L (ref 0.5–1.9)

## 2021-01-22 LAB — RESP PANEL BY RT-PCR (FLU A&B, COVID) ARPGX2
Influenza A by PCR: NEGATIVE
Influenza B by PCR: NEGATIVE
SARS Coronavirus 2 by RT PCR: NEGATIVE

## 2021-01-22 LAB — HIV ANTIBODY (ROUTINE TESTING W REFLEX): HIV Screen 4th Generation wRfx: NONREACTIVE

## 2021-01-22 MED ORDER — SENNOSIDES-DOCUSATE SODIUM 8.6-50 MG PO TABS: 1.0000 | ORAL_TABLET | Freq: Every evening | ORAL | Status: DC | PRN

## 2021-01-22 MED ORDER — TACROLIMUS 1 MG PO CAPS
7.0000 mg | ORAL_CAPSULE | Freq: Every day | ORAL | Status: DC
  Administered 2021-01-23 – 2021-01-24 (×2): 7 mg via ORAL
  Filled 2021-01-22 (×2): qty 7

## 2021-01-22 MED ORDER — MORPHINE SULFATE (PF) 4 MG/ML IV SOLN
4.0000 mg | Freq: Once | INTRAVENOUS | Status: AC
  Administered 2021-01-22: 4 mg via INTRAVENOUS
  Filled 2021-01-22: qty 1

## 2021-01-22 MED ORDER — PIPERACILLIN-TAZOBACTAM 3.375 G IVPB 30 MIN
3.3750 g | Freq: Once | INTRAVENOUS | Status: AC
  Administered 2021-01-22: 3.375 g via INTRAVENOUS
  Filled 2021-01-22: qty 50

## 2021-01-22 MED ORDER — ACETAMINOPHEN 650 MG RE SUPP: 650.0000 mg | Freq: Four times a day (QID) | RECTAL | Status: DC | PRN

## 2021-01-22 MED ORDER — LACTATED RINGERS IV BOLUS
500.0000 mL | Freq: Once | INTRAVENOUS | Status: AC
  Administered 2021-01-22: 500 mL via INTRAVENOUS

## 2021-01-22 MED ORDER — HYDROMORPHONE HCL 2 MG PO TABS
1.0000 mg | ORAL_TABLET | Freq: Four times a day (QID) | ORAL | Status: DC | PRN
  Administered 2021-01-22 – 2021-01-23 (×2): 1 mg via ORAL
  Filled 2021-01-22 (×3): qty 1

## 2021-01-22 MED ORDER — HEPARIN SODIUM (PORCINE) 5000 UNIT/ML IJ SOLN
5000.0000 [IU] | Freq: Three times a day (TID) | INTRAMUSCULAR | Status: DC
  Administered 2021-01-22 – 2021-01-24 (×6): 5000 [IU] via SUBCUTANEOUS
  Filled 2021-01-22 (×6): qty 1

## 2021-01-22 MED ORDER — TACROLIMUS 1 MG PO CAPS: 6.0000 mg | ORAL_CAPSULE | ORAL | Status: DC

## 2021-01-22 MED ORDER — SODIUM CHLORIDE 0.9 % IV SOLN
1.0000 g | INTRAVENOUS | Status: DC
  Administered 2021-01-22: 1 g via INTRAVENOUS
  Filled 2021-01-22: qty 10

## 2021-01-22 MED ORDER — MYCOPHENOLATE SODIUM 180 MG PO TBEC: 360.0000 mg | DELAYED_RELEASE_TABLET | ORAL | Status: DC

## 2021-01-22 MED ORDER — MAGNESIUM OXIDE -MG SUPPLEMENT 400 (240 MG) MG PO TABS
400.0000 mg | ORAL_TABLET | Freq: Two times a day (BID) | ORAL | Status: DC
  Administered 2021-01-22 – 2021-01-24 (×5): 400 mg via ORAL
  Filled 2021-01-22 (×10): qty 1

## 2021-01-22 MED ORDER — ACETAMINOPHEN 500 MG PO TABS
1000.0000 mg | ORAL_TABLET | Freq: Once | ORAL | Status: AC
  Administered 2021-01-22: 1000 mg via ORAL
  Filled 2021-01-22: qty 2

## 2021-01-22 MED ORDER — PREDNISONE 5 MG PO TABS
5.0000 mg | ORAL_TABLET | Freq: Every day | ORAL | Status: DC
  Administered 2021-01-22 – 2021-01-24 (×3): 5 mg via ORAL
  Filled 2021-01-22 (×4): qty 1

## 2021-01-22 MED ORDER — PIPERACILLIN-TAZOBACTAM 3.375 G IVPB
3.3750 g | Freq: Three times a day (TID) | INTRAVENOUS | Status: DC
  Administered 2021-01-22 – 2021-01-23 (×2): 3.375 g via INTRAVENOUS
  Filled 2021-01-22 (×4): qty 50

## 2021-01-22 MED ORDER — SODIUM BICARBONATE 650 MG PO TABS
2600.0000 mg | ORAL_TABLET | Freq: Three times a day (TID) | ORAL | Status: DC
  Administered 2021-01-22 – 2021-01-24 (×6): 2600 mg via ORAL
  Filled 2021-01-22 (×8): qty 4

## 2021-01-22 MED ORDER — ONDANSETRON HCL 4 MG/2ML IJ SOLN: 4.0000 mg | Freq: Four times a day (QID) | INTRAMUSCULAR | Status: DC | PRN

## 2021-01-22 MED ORDER — MYCOPHENOLATE SODIUM 180 MG PO TBEC
720.0000 mg | DELAYED_RELEASE_TABLET | Freq: Two times a day (BID) | ORAL | Status: DC
  Administered 2021-01-22 – 2021-01-24 (×4): 720 mg via ORAL
  Filled 2021-01-22 (×5): qty 4

## 2021-01-22 MED ORDER — ONDANSETRON HCL 4 MG PO TABS: 4.0000 mg | ORAL_TABLET | Freq: Four times a day (QID) | ORAL | Status: DC | PRN

## 2021-01-22 MED ORDER — SULFAMETHOXAZOLE-TRIMETHOPRIM 400-80 MG PO TABS
1.0000 | ORAL_TABLET | ORAL | Status: DC
  Administered 2021-01-24: 1 via ORAL
  Filled 2021-01-22: qty 1

## 2021-01-22 MED ORDER — TACROLIMUS 1 MG PO CAPS
6.0000 mg | ORAL_CAPSULE | Freq: Every day | ORAL | Status: DC
  Administered 2021-01-22 – 2021-01-23 (×2): 6 mg via ORAL
  Filled 2021-01-22 (×4): qty 6

## 2021-01-22 MED ORDER — ACETAMINOPHEN 325 MG PO TABS
650.0000 mg | ORAL_TABLET | Freq: Four times a day (QID) | ORAL | Status: DC | PRN
  Administered 2021-01-22 – 2021-01-24 (×5): 650 mg via ORAL
  Filled 2021-01-22 (×5): qty 2
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
## 2021-01-22 NOTE — Progress Notes (Addendum)
NEW ADMISSION NOTE  Arrival Method: bed Mental Orientation: Alert and oriented x4 Telemetry: yes Assessment: Completed Skin: Intact Iv: right wrist Pain: Generalized pain at level 10 Tubes: 0 Safety Measures: Safety Fall Prevention Plan has been given, discussed and signed Admission: Completed [facility] [place] Orientation: Patient has been orientated to the room, unit and staff.  Family: Daughter at bedside  Orders have been reviewed and implemented. Will continue to monitor the patient. Call light has been placed within reach and bed alarm has been activated.   Patient reported that she received her [place] vaccine in 2021 but does not have her vaccination cards on her  Patient reported that she is in a lot of pain and declined completed new admitting questions/documentation. Patient also reported that she would like to be left alone to rest.  Beatris Ship, RN

## 2021-01-22 NOTE — Progress Notes (Signed)
Pharmacy Antibiotic Note  Katie Lyons is a 35 y.o. female admitted on 01/21/2021 with UTI.  Pharmacy has been consulted for Zosyn dosing. WBC 14.9, SCr wnl  Plan: -Zosyn 3.375 gm IV Q 8 hours (EI infusion) -Monitor CBC, renal fx, cultures and clinical progress     Temp (24hrs), Avg:99.9 F (37.7 C), Min:98.5 F (36.9 C), Max:100.7 F (38.2 C)  Recent Labs  Lab 01/21/21 1835 01/22/21 0820  WBC 14.9*  --   CREATININE 0.90  --   LATICACIDVEN  --  1.0    CrCl cannot be calculated (Unknown ideal weight.).    No Known Allergies  Antimicrobials this admission: Zosyn 6/11 >>   Dose adjustments this admission:   Microbiology results: 6/11 BCx:  6/11 UCx:     Thank you for allowing pharmacy to be a part of this patient's care.  Albertina Parr, PharmD., BCPS, BCCCP Clinical Pharmacist Please refer to [facility] for unit-specific pharmacist

## 2021-01-22 NOTE — Progress Notes (Signed)
   01/22/21 1556  Assess: MEWS Score  Temp (!) 103.1 F (39.5 C)  BP (!) 138/91  Pulse Rate (!) 106  Resp 18  SpO2 98 %  O2 Device Room Air  Assess: MEWS Score  MEWS Temp 2  MEWS Systolic 0  MEWS Pulse 1  MEWS RR 0  MEWS LOC 0  MEWS Score 3  MEWS Score Color Yellow  Assess: if the MEWS score is Yellow or Red  Were vital signs taken at a resting state? Yes  Focused Assessment No change from prior assessment  Early Detection of Sepsis Score *See Row Information* High  MEWS guidelines implemented *See Row Information* Yes  Treat  Pain Scale 0-10  Pain Score 10  Pain Type Acute pain  Pain Location Generalized  Pain Descriptors / Indicators Aching;Discomfort  Pain Frequency Constant  Pain Onset On-going  Patients Stated Pain Goal 2  Pain Intervention(s) Medication (See eMAR)  Multiple Pain Sites No  Take Vital Signs  Increase Vital Sign Frequency  Yellow: Q 2hr X 2 then Q 4hr X 2, if remains yellow, continue Q 4hrs  Escalate  MEWS: Escalate Yellow: discuss with charge nurse/RN and consider discussing with provider and RRT  Notify: Charge Nurse/RN  Name of Charge Nurse/RN Notified Candice Applewhite  Date Charge Nurse/RN Notified 01/22/21  Time Charge Nurse/RN Notified 1556  Notify: Provider  Provider Name/Title Aldine Contes and  Date Provider Notified 01/22/21  Time Provider Notified 1558  Notification Type Page  Notification Reason Change in status  Provider response See new orders (new orders for pain managment only)  Date of Provider Response 01/22/21  Time of Provider Response 1600  Document  Patient Outcome Other (Comment) (RN will continue to monitor this patient)  Progress note created (see row info) Yes

## 2021-01-22 NOTE — H&P (Addendum)
Date: 01/22/2021               Patient Name:  Katie Lyons MRN: [id number]  DOB: 11-03-1985 Age / Sex: 34 y.o., female   PCP: Fleet Contras, MD              Medical Service: Internal Medicine Teaching Service              Attending Physician: Dr. Orpah Greek, *    First Contact: Garen Grams, MS 4 Pager: [phone]  Second Contact: Dr. Court Joy Pager: [phone]            After Hours (After 5p/  First Contact Pager: [phone]  weekends / holidays): Second Contact Pager: [phone]   Chief Complaint: dysuria and fever  History of Present Illness:  Katie Lyons is a 35yo F with a PMH of R renal transplant who presents with dysuria. Yesterday, noticed urinary frequency and dysuria. This was accompanied by chills, myalgias, and mid-line abdominal pain. She had no recorded fevers at home. She took tylenol with minimal relief. No nausea/vomiting, hematuria, chest pain, blurry vision. No new sexual partners.   She had a R kidney transplant in 2019 due to ESRD of unknown etiology. She was on HD for 10+ years until her transplant by [facility]. She takes mycophenolate, prednisone, sodium bicarb, bactrim, and tacrolimus daily. She is compliant with this medicines. No complications from her transplant. She sees her nephrologist regularly.   She denies any other PMH. Historically, per chart review, she took BP medicine. However, she no longer takes any.  She lives here in town. She denies alcohol or drug use. She does not have a PCP and uses her nephrologist as her primary physician.   Meds:  Current Meds  Medication Sig   mycophenolate (MYFORTIC) 180 MG EC tablet Take 360 mg by mouth See admin instructions. Take 4 tablets in the morning and 4 tablets in the evening.   predniSONE (DELTASONE) 5 MG tablet Take 1 tablet (5 mg total) by mouth daily. Resume after finishing steroid taper.   sodium bicarbonate 650 MG tablet Take 2,600 mg by mouth 3 (three) times daily. Three tabs ('1950mg'$ ) three  times daily   sulfamethoxazole-trimethoprim (BACTRIM,SEPTRA) 400-80 MG tablet Take 1 tablet by mouth See admin instructions. Take one tablet on Monday, Wednesday, and Friday.   tacrolimus (PROGRAF) 1 MG capsule Take 6-7 mg by mouth See admin instructions. Take 7 tablets in the morning and 6 tablets at night.   Allergies: Allergies as of 01/21/2021   (No Known Allergies)   Past Medical History:  Diagnosis Date   Anxiety    Chronic ischemic heart disease    Depression    3-4 years ago per husband   End stage renal disease ([place])    Headache    Hypertension    Iron deficiency    Secondary hyperparathyroidism ([place])    Traumatic brain injury ([place])    Family History: Unknown per patient.   Social History: Denies alc and drug use. Lives in town with family. No new sexual partners.   Review of Systems: A complete ROS was negative except as per HPI.   Physical Exam: Blood pressure (!) 92/56, pulse 66, temperature (!) 100.4 F (38 C), temperature source Oral, resp. rate 16, SpO2 98 %. Physical Exam Constitutional:      General: She is not in acute distress.    Comments: Woman resting comfortably in bed, no acute distress  Eyes:     Conjunctiva/sclera: Conjunctivae normal.  Cardiovascular:     Rate and Rhythm: Normal rate and regular rhythm.     Pulses: Normal pulses.     Comments: Holosystolic murmur (not new) Pulmonary:     Effort: Pulmonary effort is normal. No respiratory distress.     Breath sounds: Normal breath sounds.  Abdominal:     General: Abdomen is flat.     Palpations: Abdomen is soft.     Comments: Suprapubic tenderness to palpation  Genitourinary:    Comments: R CVA tenderness Musculoskeletal:        General: No swelling or tenderness.     Right lower leg: No edema.     Left lower leg: No edema.  Skin:    General: Skin is warm and dry.     Capillary Refill: Capillary refill takes less than 2 seconds.  Neurological:     Mental Status: She is oriented to  person, place, and time.   EKG: not done.   CXR: not done.   Assessment & Plan by Problem: Active Problems:   * No active hospital problems. *  Katie Lyons is a 35yo F with a PMH of R renal transplant who presents with dysuria. Her UA has trace leukocytes with negative nitrites, rare bacteria, and 21.-50 WBC. CT abdomen with perinephric stranding. As such, her fever (100.4 in ED), chills, dysuria, leukocytosis, CVA and suprapubic tenderness, and UA/CT findings are suggestive of right pyelonephritis. Given increased risk (history of renal transplant), admitting for IV antibiotics.   #Pyelonephritis #SIRS, now resolved  CT findings, UA, leukocytosis to 14.9 with left shift, fever support diagnosis. Normal lactate. BP low normal at 106/66 on last read. BP and HR now improved after fluid bolus. bHCG normal.  - s/p LR 550m bolus and 1g Ceftriaxone in ED - Start Zosyn given history of renal transplant and concern of Pseudomonas (narrow when cultures back)  - Obtaining renal transplant U/S - BC/UC collected - Zofran PRN for n/v - Tylenol PRN for pain/fever  #R kidney transplant Transplant in 2019 for ESRD of unknown etiology. - Consulting nephrology  - Cr normal at 0.90, BUN 10, GFR >60 - Cont home meds: mycophenolate, prednisone, sodium bicarb, bactrim, and tacrolimus  Dispo: Admit patient to Observation with expected length of stay less than 2 midnights.  Signed: RMalachi Carl Medical Student 01/22/2021, 11:46 AM   Attestation for Student Documentation:  I personally was present and performed or re-performed the history, physical exam and medical decision-making activities of this service and have verified that the service and findings are accurately documented in the student's note.  SMadalyn Rob MD 01/22/2021, 9:30 PM

## 2021-01-23 DIAGNOSIS — N136 Pyonephrosis: Secondary | ICD-10-CM

## 2021-01-23 LAB — COMPREHENSIVE METABOLIC PANEL
ALT: 42 U/L (ref 0–44)
AST: 26 U/L (ref 15–41)
Albumin: 3 g/dL — ABNORMAL LOW (ref 3.5–5.0)
Alkaline Phosphatase: 109 U/L (ref 38–126)
Anion gap: 7 (ref 5–15)
BUN: 14 mg/dL (ref 6–20)
CO2: 23 mmol/L (ref 22–32)
Calcium: 8.9 mg/dL (ref 8.9–10.3)
Chloride: 103 mmol/L (ref 98–111)
Creatinine, Ser: 0.91 mg/dL (ref 0.44–1.00)
GFR, Estimated: >60 mL/min (ref >60)
Glucose, Bld: 150 mg/dL — ABNORMAL HIGH (ref 70–99)
Potassium: 3.8 mmol/L (ref 3.5–5.1)
Sodium: 133 mmol/L — ABNORMAL LOW (ref 135–145)
Total Bilirubin: 0.7 mg/dL (ref 0.3–1.2)
Total Protein: 5.9 g/dL — ABNORMAL LOW (ref 6.5–8.1)

## 2021-01-23 LAB — CBC WITH DIFFERENTIAL/PLATELET
Abs Immature Granulocytes: 0.09 10*3/uL — ABNORMAL HIGH (ref 0.00–0.07)
Basophils Absolute: 0 10*3/uL (ref 0.0–0.1)
Basophils Relative: 0 %
Eosinophils Absolute: 0 10*3/uL (ref 0.0–0.5)
Eosinophils Relative: 0 %
HCT: 38.1 % (ref 36.0–46.0)
Hemoglobin: 12.7 g/dL (ref 12.0–15.0)
Immature Granulocytes: 1 %
Lymphocytes Relative: 9 %
Lymphs Abs: 1.3 10*3/uL (ref 0.7–4.0)
MCH: 28.7 pg (ref 26.0–34.0)
MCHC: 33.3 g/dL (ref 30.0–36.0)
MCV: 86.2 fL (ref 80.0–100.0)
Monocytes Absolute: 1.2 10*3/uL — ABNORMAL HIGH (ref 0.1–1.0)
Monocytes Relative: 8 %
Neutro Abs: 12.1 10*3/uL — ABNORMAL HIGH (ref 1.7–7.7)
Neutrophils Relative %: 82 %
Platelets: 134 10*3/uL — ABNORMAL LOW (ref 150–400)
RBC: 4.42 MIL/uL (ref 3.87–5.11)
RDW: 13.2 % (ref 11.5–15.5)
WBC: 14.7 10*3/uL — ABNORMAL HIGH (ref 4.0–10.5)
nRBC: 0 % (ref 0.0–0.2)

## 2021-01-23 MED ORDER — SODIUM CHLORIDE 0.9 % IV SOLN
2.0000 g | Freq: Three times a day (TID) | INTRAVENOUS | Status: DC
  Administered 2021-01-23 – 2021-01-24 (×3): 2 g via INTRAVENOUS
  Filled 2021-01-23 (×3): qty 2

## 2021-01-23 NOTE — Progress Notes (Signed)
  Date: 01/23/2021  Patient name: Katie Lyons  Medical record number: [id number]  Date of birth: 02-Apr-1986   I have seen and evaluated Katie Lyons and discussed their care with the Residency Team.  In brief, patient is a 35 year old female with past medical history of right renal transplant, depression, hypertension who presented to the ED with dysuria x1 day.  Patient states that on the day prior to admission she noted increased urinary frequency and dysuria.  Patient then developed chills, myalgias and abdominal pain.  Patient states that abdominal pain was in her right lower quadrant, nonradiating.  No nausea or vomiting, no lightheadedness, no syncope, no focal weakness, no tingling or numbness, no blurry vision, no diarrhea.  She did not record any fevers at home but was noted to be febrile in the hospital up to 103 F.  History obtained with the help of a translator (phone).  Today, patient states that her abdominal pain is slowly improving and that her dysuria is also getting better but she does complain of a headache this morning.   PMHx, Fam Hx, and/or Soc Hx : As per resident admit note  Vitals:   01/23/21 0641 01/23/21 0924  BP: 100/74 111/67  Pulse: 73 77  Resp: 17 18  Temp: 98 F (36.7 C) 98.1 F (36.7 C)  SpO2: 100% 100%   General: Awake, alert, oriented x3, NAD CVS: Regular rate and rhythm, systolic murmur noted Lungs: CTA bilaterally Abdomen: Soft, mild right lower quadrant tenderness to palpation, no guarding or rebound, nondistended, normoactive bowel sounds Extremities: No edema noted, nontender to palpation Psych: Normal mood and affect HEENT: Normocephalic, atraumatic Skin: Warm and dry  Assessment and Plan: I have seen and evaluated the patient as outlined above. I agree with the formulated Assessment and Plan as detailed in the residents' note, with the following changes:   1.  Transplant kidney pyelonephritis: -Patient presented to  the ED with dysuria and chills x1 day and was noted to have leukocytosis, fevers up to 103 F and a CT scan showing perinephric stranding of the transplant kidney consistent with pyelonephritis. -We will continue with IV Zosyn for now (will need to cover Pseudomonas in the setting of a history of renal transplant) -We will follow-up urine cultures (growing gram-negative rods) -Blood cultures no growth to date -Continue with Zofran as needed for nausea and vomiting -We will continue with immunosuppressive medications for her renal transplant including mycophenolate, prednisone and tacrolimus -Patient's creatinine appears to be at baseline (0.9 currently) -No further work-up at this time  Aldine Contes, MD 6/12/202210:23 AM

## 2021-01-23 NOTE — Progress Notes (Signed)
Pharmacy Antibiotic Note  Katie Lyons is a 35 y.o. female admitted on 01/21/2021 with UTI.  Pharmacy has been consulted for Zosyn dosing. WBC 14.9, SCr wnl  Currently being treated with zosyn for pyelo. Urine culture is growing GNR. D/w Dr Court Joy and we will optimize her abx to cefepime instead since we don't need anaerobes coverage here.   Plan: Zosyn>>Cefepime 2g IV q8  Height: '4\' 11"'$  (149.9 cm) Weight: 61.7 kg (136 lb) IBW/kg (Calculated) : 43.2  Temp (24hrs), Avg:99.7 F (37.6 C), Min:97.3 F (36.3 C), Max:103.2 F (39.6 C)  Recent Labs  Lab 01/21/21 1835 01/22/21 0820 01/22/21 1646 01/23/21 0256  WBC 14.9*  --   --  14.7*  CREATININE 0.90  --   --  0.91  LATICACIDVEN  --  1.0 1.0  --      Estimated Creatinine Clearance: 67.6 mL/min (by C-G formula based on SCr of 0.91 mg/dL).    No Known Allergies  Antimicrobials this admission: Zosyn 6/11 >> 6/12 Cefepime 6/12>>  Dose adjustments this admission:   Microbiology results: 6/11 BCx: ngtd 6/11 UCx:  30k GNR   Onnie Boer, PharmD, BCIDP, AAHIVP, CPP Infectious Disease Pharmacist 01/23/2021 12:18 PM

## 2021-01-23 NOTE — Hospital Course (Signed)
Encounter was done via phone Spanish interpreter. Patient states she started having a aching and throbbing headache in her temples since last night. The pain in her body.abdomen has improved.

## 2021-01-23 NOTE — Progress Notes (Signed)
    Subjective:  Overnight Events: No overnight events   Patient reports her abdominal pain and dysuria is improving overnight.  Patient does complain of a headache across her forehead. No other acute concerns.   Objective:  Vital signs in last 24 hours: Vitals:   01/22/21 2000 01/22/21 2301 01/23/21 0641 01/23/21 0924  BP: 105/73 104/77 100/74 111/67  Pulse: 67 61 73 77  Resp: '17 17 17 18  '$ Temp: (!) 97.3 F (36.3 C) 97.7 F (36.5 C) 98 F (36.7 C) 98.1 F (36.7 C)  TempSrc: Oral Oral Oral Oral  SpO2: 99% 99% 100% 100%  Weight:      Height:       Supplemental O2: Room Air SpO2: 100 %   Physical Exam:   General: NAD, nl appearance. Spanish speaking HE: Normocephalic, atraumatic , EOMI, Conjunctivae normal, PERRL ENT: No congestion, no rhinorrhea, no exudate or erythema, neck supple Cardiovascular: Normal rate, regular rhythm.  No murmurs, rubs, or gallops Pulmonary : Effort normal, breath sounds normal. No wheezes, rales, or rhonchi Abdominal: normal bowel sounds, soft, mild right lower quadrant tenderness to palpation,    Assessment/Plan:   Active Problems:   Pyelonephritis   Patient Summary: Hospital day 1 for Katie Lyons a 35 y.o. person living with right renal transplant admitted for dysuria and found to have pyelonephritis.  #Transplant kidney pyonephritis Afebrile overnight patient has received Tylenol.  WBC stable at 14.  Patient reports symptomatic improvement.  Urine culture growing gram-negative rods.  Agree with recommendation from pharmacist to narrow coverage to cefepime as we do not need anaerobic coverage.  Blood cultures negative to date - stop Zosyn - Start Cefepime 2G IV every 8 -Trend CBC - Continue home renal transplant medications including immunosuppressive medications . (bactrim, sodium bicarbonate, mycophenolate, prednisone and tacrolimus)   Diet: Regular IVF: None,None VTE: Heparin Code: Full  Dispo: Anticipated discharge  to Home in 2 days pending continued clinical improvement.   Tamsen Snider, MD PGY2 Internal Medicine Pager: [phone] Please contact the on call pager after 5 pm and on weekends at [phone].

## 2021-01-24 ENCOUNTER — Other Ambulatory Visit (HOSPITAL_COMMUNITY): Payer: Self-pay

## 2021-01-24 LAB — GLUCOSE, CAPILLARY: Glucose-Capillary: 115 mg/dL — ABNORMAL HIGH (ref 70–99)

## 2021-01-24 LAB — CBC WITH DIFFERENTIAL/PLATELET
Abs Immature Granulocytes: 0.04 10*3/uL (ref 0.00–0.07)
Basophils Absolute: 0 10*3/uL (ref 0.0–0.1)
Basophils Relative: 0 %
Eosinophils Absolute: 0.1 10*3/uL (ref 0.0–0.5)
Eosinophils Relative: 1 %
HCT: 37.8 % (ref 36.0–46.0)
Hemoglobin: 12.4 g/dL (ref 12.0–15.0)
Immature Granulocytes: 0 %
Lymphocytes Relative: 19 %
Lymphs Abs: 2.1 10*3/uL (ref 0.7–4.0)
MCH: 28.6 pg (ref 26.0–34.0)
MCHC: 32.8 g/dL (ref 30.0–36.0)
MCV: 87.1 fL (ref 80.0–100.0)
Monocytes Absolute: 1 10*3/uL (ref 0.1–1.0)
Monocytes Relative: 10 %
Neutro Abs: 7.6 10*3/uL (ref 1.7–7.7)
Neutrophils Relative %: 70 %
Platelets: 143 10*3/uL — ABNORMAL LOW (ref 150–400)
RBC: 4.34 MIL/uL (ref 3.87–5.11)
RDW: 13.4 % (ref 11.5–15.5)
WBC: 10.8 10*3/uL — ABNORMAL HIGH (ref 4.0–10.5)
nRBC: 0 % (ref 0.0–0.2)

## 2021-01-24 LAB — BASIC METABOLIC PANEL
Anion gap: 8 (ref 5–15)
BUN: 18 mg/dL (ref 6–20)
CO2: 23 mmol/L (ref 22–32)
Calcium: 9.2 mg/dL (ref 8.9–10.3)
Chloride: 105 mmol/L (ref 98–111)
Creatinine, Ser: 0.96 mg/dL (ref 0.44–1.00)
GFR, Estimated: >60 mL/min (ref >60)
Glucose, Bld: 128 mg/dL — ABNORMAL HIGH (ref 70–99)
Potassium: 3.9 mmol/L (ref 3.5–5.1)
Sodium: 136 mmol/L (ref 135–145)

## 2021-01-24 LAB — URINE CULTURE

## 2021-01-24 MED ORDER — CIPROFLOXACIN HCL 500 MG PO TABS
500.0000 mg | ORAL_TABLET | Freq: Two times a day (BID) | ORAL | 0 refills | Status: AC
  Filled 2021-01-24: qty 25, 13d supply, fill #0

## 2021-01-24 NOTE — Discharge Summary (Signed)
Name: Katie Lyons MRN: [id number] DOB: 04-Aug-1986 35 y.o. PCP: Katie Contras, MD  Date of Admission: 01/21/2021  6:18 PM Date of Discharge: 01/24/2021 Attending Physician: Katie Contes, MD  Discharge Diagnosis: 1. Right pyelonephritis of transplanted kidney   Discharge Medications: Allergies as of 01/24/2021   No Known Allergies      Medication List     TAKE these medications    ciprofloxacin 500 MG tablet Commonly known as: Cipro Take 1 tablet (500 mg total) by mouth 2 (two) times daily for 25 doses.   magnesium oxide 400 MG tablet Commonly known as: MAG-OX Take 400 mg by mouth 2 (two) times daily.   mycophenolate 180 MG EC tablet Commonly known as: MYFORTIC Take 360 mg by mouth See admin instructions. Take 4 tablets in the morning and 4 tablets in the evening.   predniSONE 5 MG tablet Commonly known as: DELTASONE Take 1 tablet (5 mg total) by mouth daily. Resume after finishing steroid taper.   sodium bicarbonate 650 MG tablet Take 2,600 mg by mouth 3 (three) times daily. Three tabs ('1950mg'$ ) three times daily   sulfamethoxazole-trimethoprim 400-80 MG tablet Commonly known as: BACTRIM Take 1 tablet by mouth See admin instructions. Take one tablet on Monday, Wednesday, and Friday.   tacrolimus 1 MG capsule Commonly known as: PROGRAF Take 6-7 mg by mouth See admin instructions. Take 7 tablets in the morning and 6 tablets at night.       Disposition and follow-up:   Ms.Katie Lyons was discharged from [facility] in Good condition.  At the hospital follow up visit please address:  1.  Pyelonephritis - pain, dysuria, antibiotic use.        Aortic atherosclerosis on CT, consider statin therapy  2.  Labs / imaging needed at time of follow-up: Consider BMP, CBC.   3.  Pending labs/test needing follow-up: None  Follow-up Appointments:  Follow-up Information     Katie Contras, MD Follow up in 1 day(s).    Specialty: Nephrology Why: Follow up with your schedule appoitment with your nephrologist. Contact information: Katie Lyons [id number] [phone]               Wednesday, 6/15, at [facility] Course by problem list: 1. Right pyelonephritis of transplanted kidney, improved: Katie Lyons presented with one day of dysuria, suprapubic tenderness, body aches, and fevers/chills. On admission, UA significant for WBC and bacteria but nitrite negative. CT scan demonstrated perinephritic stranding/fluid. A follow-up U/S demonstrated similar findings. These findings, in combination with an elevated WBC with left shift, were suggestive of pyelonephritis; it was on her right side where she has a transplanted kidney from ESRD of unknown etiology. She was started on Zosyn for broad coverage. Urine culture demonstrated gram negative rods (prompting transition to Cefepime) with a final read of multiple species being present. As such, she was discharged on Ciprofloxacin for continued coverage of GNR, including Pseudomonas. Her symptoms and WBC improved throughout admission with complete resolution of symptoms on day of discharge. Her Cr, BUN, and GFR were stable and within normal limits. She will complete Ciprofloxacin through June 25 for a 14-day course. She is following up with her nephrology at [facility] on Wednesday.   2. Right kidney transplant: She had a transplant in 2019 for ESRD of unknown etiology after being on HD for 10+ years. On admission, nephrology consulted due to infection of a transplanted organ. Nephrology recommended continuing her home immunosuppressants. As such,  she continued bactrim, prednisone, mycophenolate, tacrolimus, and sodium bicarb during admission. She will continue these on discharge and follow-up with her nephrologist on Wednesday.   Discharge Exam:   BP 118/73 (BP Location: Right Arm)   Pulse 75   Temp 98.8 F (37.1 C) (Oral)   Resp 18    Ht '4\' 11"'$  (1.499 m)   Wt 61.7 kg   SpO2 97%   BMI 27.47 kg/m  Discharge exam:  Physical Exam Constitutional:      Appearance: Normal appearance.     Comments: Well-appearing female, resting on couch, conversational, no acute distress  Cardiovascular:     Rate and Rhythm: Normal rate and regular rhythm.     Comments: Holosystolic murmur Pulmonary:     Effort: Pulmonary effort is normal. No respiratory distress.     Breath sounds: Normal breath sounds.  Abdominal:     General: Abdomen is flat.     Palpations: Abdomen is soft.     Tenderness: There is no abdominal tenderness.  Musculoskeletal:     Right lower leg: No edema.     Left lower leg: No edema.  Skin:    General: Skin is warm and dry.  Neurological:     Mental Status: She is alert.   Pertinent Labs, Studies, and Procedures:  1. CT renal stone study: 1. Right lower quadrant renal transplant kidney with perinephric fluid and stranding, concerning for pyelonephritis. Correlate with urinalysis and consider renal transplant ultrasound for further evaluation. 2. Aortic Atherosclerosis (ICD10-I70.0).  2. U/S renal transplant study: Normal appearing transplant kidney in the right lower quadrant without hydronephrosis. Small amount of perinephric fluid as seen on the CT scan which could be due to infection, although no other sonographic evidence of infection.  Discharge Instructions: Discharge Instructions     Diet - low sodium heart healthy   Complete by: As directed    Increase activity slowly   Complete by: As directed      Katie Lyons, it was a pleasure to take care of you. We treated you with antibiotics for pyelonephritis, an infection, of your kidney. We are glad you are feeling better. Your kidney function remained well during your stay. When you leave, please complete your antibiotic and follow-up with your kidney doctor at [facility]. Take care!  Katie Lyons, fue un placer atenderte. Lo tratamos con antibiticos para  la pielonefritis, una infeccin, de su rin. Nos alegra que te sientas mejor. Su funcin renal se mantuvo bien durante su estada. Cuando se vaya, complete su antibitico y haga un seguimiento con su nefrlogo en [place]!  Signed: Garen Grams 01/24/2021, 11:54 AM    Attestation for Student Documentation:  I personally was present and performed or re-performed the history, physical exam and medical decision-making activities of this service and have verified that the service and findings are accurately documented in the student's note.  Madalyn Rob, MD 01/24/2021, 11:54 AM

## 2021-01-24 NOTE — Progress Notes (Signed)
DISCHARGE NOTE HOME Brezlyn Savedra-Hilario to be discharged Home per MD order. Discussed prescriptions and follow up appointments with the patient. Prescriptions given to patient; medication list explained in detail. Patient verbalized understanding.  Skin clean, dry and intact without evidence of skin break down, no evidence of skin tears noted. IV catheter discontinued intact. Site without signs and symptoms of complications. Dressing and pressure applied. Pt denies pain at the site currently. No complaints noted.  Patient free of lines, drains, and wounds.   An After Visit Summary (AVS) was printed and given to the patient. Patient escorted via wheelchair, and discharged home via private auto.  Mikki Santee, RN

## 2021-01-24 NOTE — Discharge Instructions (Signed)
Katie Lyons, fue un placer atenderte. Lo tratamos con antibiticos para la pielonefritis, una infeccin, de su rin. Nos alegra que te sientas mejor. Su funcin renal se mantuvo bien durante su estada. Cuando se vaya, complete su antibitico y haga un seguimiento con su nefrlogo en [place]!

## 2021-01-27 LAB — CULTURE, BLOOD (ROUTINE X 2)
Culture: NO GROWTH
Culture: NO GROWTH
Special Requests: ADEQUATE
Special Requests: ADEQUATE

## 2021-03-09 ENCOUNTER — Encounter (HOSPITAL_COMMUNITY): Payer: Self-pay | Admitting: Oncology

## 2021-03-09 ENCOUNTER — Emergency Department (HOSPITAL_COMMUNITY): Payer: BLUE CROSS/BLUE SHIELD

## 2021-03-09 ENCOUNTER — Inpatient Hospital Stay (HOSPITAL_COMMUNITY)
Admission: EM | Admit: 2021-03-09 | Discharge: 2021-03-14 | DRG: 698 | Disposition: A | Discharge status: Home Health | Payer: BLUE CROSS/BLUE SHIELD | Attending: Internal Medicine | Admitting: Internal Medicine

## 2021-03-09 ENCOUNTER — Other Ambulatory Visit: Payer: Self-pay

## 2021-03-09 ENCOUNTER — Inpatient Hospital Stay (HOSPITAL_COMMUNITY): Payer: BLUE CROSS/BLUE SHIELD

## 2021-03-09 DIAGNOSIS — R945 Abnormal results of liver function studies: Secondary | ICD-10-CM | POA: Diagnosis not present

## 2021-03-09 DIAGNOSIS — A4151 Sepsis due to Escherichia coli [E. coli]: Secondary | ICD-10-CM | POA: Diagnosis present

## 2021-03-09 DIAGNOSIS — T8613 Kidney transplant infection: Secondary | ICD-10-CM | POA: Diagnosis present

## 2021-03-09 DIAGNOSIS — I700 Atherosclerosis of aorta: Secondary | ICD-10-CM | POA: Diagnosis present

## 2021-03-09 DIAGNOSIS — D509 Iron deficiency anemia, unspecified: Secondary | ICD-10-CM | POA: Diagnosis not present

## 2021-03-09 DIAGNOSIS — N10 Acute tubulo-interstitial nephritis: Secondary | ICD-10-CM | POA: Diagnosis present

## 2021-03-09 DIAGNOSIS — D649 Anemia, unspecified: Secondary | ICD-10-CM | POA: Diagnosis present

## 2021-03-09 DIAGNOSIS — Z20822 Contact with and (suspected) exposure to covid-19: Secondary | ICD-10-CM | POA: Diagnosis present

## 2021-03-09 DIAGNOSIS — N390 Urinary tract infection, site not specified: Secondary | ICD-10-CM | POA: Diagnosis not present

## 2021-03-09 DIAGNOSIS — A419 Sepsis, unspecified organism: Secondary | ICD-10-CM | POA: Diagnosis not present

## 2021-03-09 DIAGNOSIS — Z8616 Personal history of COVID-19: Secondary | ICD-10-CM

## 2021-03-09 DIAGNOSIS — Y830 Surgical operation with transplant of whole organ as the cause of abnormal reaction of the patient, or of later complication, without mention of misadventure at the time of the procedure: Secondary | ICD-10-CM | POA: Diagnosis present

## 2021-03-09 DIAGNOSIS — T8619 Other complication of kidney transplant: Secondary | ICD-10-CM

## 2021-03-09 DIAGNOSIS — E872 Acidosis: Secondary | ICD-10-CM | POA: Diagnosis present

## 2021-03-09 DIAGNOSIS — I259 Chronic ischemic heart disease, unspecified: Secondary | ICD-10-CM | POA: Diagnosis present

## 2021-03-09 DIAGNOSIS — B9620 Unspecified Escherichia coli [E. coli] as the cause of diseases classified elsewhere: Secondary | ICD-10-CM | POA: Diagnosis not present

## 2021-03-09 DIAGNOSIS — Z79899 Other long term (current) drug therapy: Secondary | ICD-10-CM | POA: Diagnosis not present

## 2021-03-09 DIAGNOSIS — B9629 Other Escherichia coli [E. coli] as the cause of diseases classified elsewhere: Secondary | ICD-10-CM | POA: Diagnosis not present

## 2021-03-09 DIAGNOSIS — R7303 Prediabetes: Secondary | ICD-10-CM | POA: Diagnosis present

## 2021-03-09 DIAGNOSIS — I10 Essential (primary) hypertension: Secondary | ICD-10-CM | POA: Diagnosis present

## 2021-03-09 DIAGNOSIS — Z1612 Extended spectrum beta lactamase (ESBL) resistance: Secondary | ICD-10-CM | POA: Diagnosis not present

## 2021-03-09 DIAGNOSIS — N2581 Secondary hyperparathyroidism of renal origin: Secondary | ICD-10-CM | POA: Diagnosis present

## 2021-03-09 DIAGNOSIS — Z7952 Long term (current) use of systemic steroids: Secondary | ICD-10-CM

## 2021-03-09 DIAGNOSIS — D696 Thrombocytopenia, unspecified: Secondary | ICD-10-CM | POA: Diagnosis present

## 2021-03-09 DIAGNOSIS — N12 Tubulo-interstitial nephritis, not specified as acute or chronic: Secondary | ICD-10-CM | POA: Diagnosis present

## 2021-03-09 LAB — CBC WITH DIFFERENTIAL/PLATELET
Abs Immature Granulocytes: 0.1 10*3/uL — ABNORMAL HIGH (ref 0.00–0.07)
Basophils Absolute: 0.1 10*3/uL (ref 0.0–0.1)
Basophils Relative: 0 %
Eosinophils Absolute: 0 10*3/uL (ref 0.0–0.5)
Eosinophils Relative: 0 %
HCT: 40.2 % (ref 36.0–46.0)
Hemoglobin: 13.4 g/dL (ref 12.0–15.0)
Immature Granulocytes: 1 %
Lymphocytes Relative: 6 %
Lymphs Abs: 1.1 10*3/uL (ref 0.7–4.0)
MCH: 28.9 pg (ref 26.0–34.0)
MCHC: 33.3 g/dL (ref 30.0–36.0)
MCV: 86.6 fL (ref 80.0–100.0)
Monocytes Absolute: 0.3 10*3/uL (ref 0.1–1.0)
Monocytes Relative: 2 %
Neutro Abs: 17.1 10*3/uL — ABNORMAL HIGH (ref 1.7–7.7)
Neutrophils Relative %: 91 %
Platelets: 163 10*3/uL (ref 150–400)
RBC: 4.64 MIL/uL (ref 3.87–5.11)
RDW: 13.7 % (ref 11.5–15.5)
WBC: 18.6 10*3/uL — ABNORMAL HIGH (ref 4.0–10.5)
nRBC: 0 % (ref 0.0–0.2)

## 2021-03-09 LAB — URINALYSIS, ROUTINE W REFLEX MICROSCOPIC
Bilirubin Urine: NEGATIVE
Glucose, UA: NEGATIVE mg/dL
Ketones, ur: NEGATIVE mg/dL
Nitrite: NEGATIVE
Protein, ur: 30 mg/dL — AB
Specific Gravity, Urine: 1.013 (ref 1.005–1.030)
WBC, UA: >50 WBC/hpf — ABNORMAL HIGH (ref 0–5)
pH: 5 (ref 5.0–8.0)

## 2021-03-09 LAB — COMPREHENSIVE METABOLIC PANEL
ALT: 106 U/L — ABNORMAL HIGH (ref 0–44)
AST: 83 U/L — ABNORMAL HIGH (ref 15–41)
Albumin: 4.1 g/dL (ref 3.5–5.0)
Alkaline Phosphatase: 121 U/L (ref 38–126)
Anion gap: 10 (ref 5–15)
BUN: 18 mg/dL (ref 6–20)
CO2: 19 mmol/L — ABNORMAL LOW (ref 22–32)
Calcium: 9.8 mg/dL (ref 8.9–10.3)
Chloride: 106 mmol/L (ref 98–111)
Creatinine, Ser: 0.85 mg/dL (ref 0.44–1.00)
GFR, Estimated: >60 mL/min (ref >60)
Glucose, Bld: 141 mg/dL — ABNORMAL HIGH (ref 70–99)
Potassium: 3.5 mmol/L (ref 3.5–5.1)
Sodium: 135 mmol/L (ref 135–145)
Total Bilirubin: 1.7 mg/dL — ABNORMAL HIGH (ref 0.3–1.2)
Total Protein: 7.3 g/dL (ref 6.5–8.1)

## 2021-03-09 LAB — LACTIC ACID, PLASMA
Lactic Acid, Venous: 1.2 mmol/L (ref 0.5–1.9)
Lactic Acid, Venous: 1 mmol/L (ref 0.5–1.9)
Lactic Acid, Venous: 1.2 mmol/L (ref 0.5–1.9)

## 2021-03-09 LAB — APTT
aPTT: 29 seconds (ref 24–36)
aPTT: 33 seconds (ref 24–36)

## 2021-03-09 LAB — TSH: TSH: 0.175 u[IU]/mL — ABNORMAL LOW (ref 0.350–4.500)

## 2021-03-09 LAB — RESP PANEL BY RT-PCR (FLU A&B, COVID) ARPGX2
Influenza A by PCR: NEGATIVE
Influenza B by PCR: NEGATIVE
SARS Coronavirus 2 by RT PCR: NEGATIVE

## 2021-03-09 LAB — PROTIME-INR
INR: 1.1 (ref 0.8–1.2)
INR: 1.2 (ref 0.8–1.2)
Prothrombin Time: 14.3 seconds (ref 11.4–15.2)
Prothrombin Time: 15.6 seconds — ABNORMAL HIGH (ref 11.4–15.2)

## 2021-03-09 LAB — I-STAT BETA HCG BLOOD, ED (MC, WL, AP ONLY): I-stat hCG, quantitative: <5 m[IU]/mL (ref <5)

## 2021-03-09 LAB — GROUP A STREP BY PCR: Group A Strep by PCR: NOT DETECTED

## 2021-03-09 MED ORDER — FENTANYL CITRATE (PF) 100 MCG/2ML IJ SOLN
100.0000 ug | Freq: Once | INTRAMUSCULAR | Status: AC
Start: 2021-03-09 — End: 2021-03-09
  Administered 2021-03-09: 100 ug via INTRAVENOUS
  Filled 2021-03-09: qty 2

## 2021-03-09 MED ORDER — LACTATED RINGERS IV BOLUS
1000.0000 mL | Freq: Once | INTRAVENOUS | Status: AC
  Administered 2021-03-09: 1000 mL via INTRAVENOUS

## 2021-03-09 MED ORDER — SODIUM CHLORIDE 0.9 % IV SOLN
2.0000 g | Freq: Three times a day (TID) | INTRAVENOUS | Status: DC
  Administered 2021-03-09 – 2021-03-11 (×5): 2 g via INTRAVENOUS
  Filled 2021-03-09 (×5): qty 2

## 2021-03-09 MED ORDER — SODIUM CHLORIDE 0.9 % IV SOLN: 1.0000 g | INTRAVENOUS | Status: DC

## 2021-03-09 MED ORDER — ACETAMINOPHEN 325 MG PO TABS
650.0000 mg | ORAL_TABLET | Freq: Four times a day (QID) | ORAL | Status: DC | PRN
  Administered 2021-03-10 – 2021-03-13 (×4): 650 mg via ORAL
  Filled 2021-03-09 (×4): qty 2

## 2021-03-09 MED ORDER — DOCUSATE SODIUM 100 MG PO CAPS
100.0000 mg | ORAL_CAPSULE | Freq: Two times a day (BID) | ORAL | Status: DC
  Administered 2021-03-10 – 2021-03-14 (×5): 100 mg via ORAL
  Filled 2021-03-09 (×9): qty 1

## 2021-03-09 MED ORDER — POLYETHYLENE GLYCOL 3350 17 G PO PACK: 17.0000 g | PACK | Freq: Every day | ORAL | Status: DC | PRN

## 2021-03-09 MED ORDER — BISACODYL 10 MG RE SUPP: 10.0000 mg | Freq: Every day | RECTAL | Status: DC | PRN

## 2021-03-09 MED ORDER — PREDNISONE 5 MG PO TABS
5.0000 mg | ORAL_TABLET | Freq: Every day | ORAL | Status: DC
  Administered 2021-03-10 – 2021-03-14 (×5): 5 mg via ORAL
  Filled 2021-03-09 (×5): qty 1

## 2021-03-09 MED ORDER — MYCOPHENOLATE SODIUM 180 MG PO TBEC
720.0000 mg | DELAYED_RELEASE_TABLET | Freq: Two times a day (BID) | ORAL | Status: DC
  Administered 2021-03-09 – 2021-03-14 (×10): 720 mg via ORAL
  Filled 2021-03-09 (×12): qty 4

## 2021-03-09 MED ORDER — SODIUM CHLORIDE 0.9 % IV BOLUS
1000.0000 mL | Freq: Once | INTRAVENOUS | Status: AC
  Administered 2021-03-09: 1000 mL via INTRAVENOUS

## 2021-03-09 MED ORDER — HYDROMORPHONE HCL 1 MG/ML IJ SOLN
0.5000 mg | INTRAMUSCULAR | Status: DC | PRN
  Administered 2021-03-09 – 2021-03-10 (×2): 1 mg via INTRAVENOUS
  Filled 2021-03-09 (×2): qty 1

## 2021-03-09 MED ORDER — SODIUM CHLORIDE 0.9 % IV SOLN
2.0000 g | Freq: Once | INTRAVENOUS | Status: AC
  Administered 2021-03-09: 2 g via INTRAVENOUS
  Filled 2021-03-09 (×3): qty 2

## 2021-03-09 MED ORDER — POTASSIUM CHLORIDE IN NACL 20-0.9 MEQ/L-% IV SOLN
INTRAVENOUS | Status: DC
Start: 2021-03-09 — End: 2021-03-11
  Filled 2021-03-09 (×4): qty 1000

## 2021-03-09 MED ORDER — OXYCODONE HCL 5 MG PO TABS
5.0000 mg | ORAL_TABLET | ORAL | Status: DC | PRN
  Administered 2021-03-10 – 2021-03-11 (×2): 5 mg via ORAL
  Filled 2021-03-09 (×2): qty 1

## 2021-03-09 MED ORDER — ONDANSETRON HCL 4 MG/2ML IJ SOLN: 4.0000 mg | Freq: Four times a day (QID) | INTRAMUSCULAR | Status: DC | PRN

## 2021-03-09 MED ORDER — ONDANSETRON HCL 4 MG PO TABS: 4.0000 mg | ORAL_TABLET | Freq: Four times a day (QID) | ORAL | Status: DC | PRN

## 2021-03-09 MED ORDER — ONDANSETRON HCL 4 MG/2ML IJ SOLN
4.0000 mg | Freq: Once | INTRAMUSCULAR | Status: AC
  Administered 2021-03-09: 4 mg via INTRAVENOUS
  Filled 2021-03-09: qty 2

## 2021-03-09 MED ORDER — IBUPROFEN 200 MG PO TABS
400.0000 mg | ORAL_TABLET | Freq: Four times a day (QID) | ORAL | Status: DC | PRN
  Administered 2021-03-09: 400 mg via ORAL
  Filled 2021-03-09: qty 2

## 2021-03-09 MED ORDER — HEPARIN SODIUM (PORCINE) 5000 UNIT/ML IJ SOLN
5000.0000 [IU] | Freq: Three times a day (TID) | INTRAMUSCULAR | Status: DC
  Administered 2021-03-09 – 2021-03-11 (×5): 5000 [IU] via SUBCUTANEOUS
  Filled 2021-03-09 (×8): qty 1

## 2021-03-09 MED ORDER — HYDRALAZINE HCL 20 MG/ML IJ SOLN: 10.0000 mg | Freq: Four times a day (QID) | INTRAMUSCULAR | Status: DC | PRN

## 2021-03-09 MED ORDER — ACETAMINOPHEN 325 MG PO TABS
650.0000 mg | ORAL_TABLET | Freq: Once | ORAL | Status: AC
  Administered 2021-03-09: 650 mg via ORAL
  Filled 2021-03-09: qty 2

## 2021-03-09 MED ORDER — SODIUM BICARBONATE 650 MG PO TABS
1950.0000 mg | ORAL_TABLET | Freq: Three times a day (TID) | ORAL | Status: DC
  Administered 2021-03-09 – 2021-03-14 (×14): 1950 mg via ORAL
  Filled 2021-03-09 (×14): qty 3

## 2021-03-09 MED ORDER — TACROLIMUS 1 MG PO CAPS
6.0000 mg | ORAL_CAPSULE | Freq: Every day | ORAL | Status: DC
  Administered 2021-03-09 – 2021-03-13 (×5): 6 mg via ORAL
  Filled 2021-03-09 (×6): qty 6

## 2021-03-09 MED ORDER — FENTANYL CITRATE (PF) 100 MCG/2ML IJ SOLN
100.0000 ug | Freq: Once | INTRAMUSCULAR | Status: AC
  Administered 2021-03-09: 100 ug via INTRAVENOUS
  Filled 2021-03-09 (×2): qty 2

## 2021-03-09 MED ORDER — TACROLIMUS 1 MG PO CAPS
7.0000 mg | ORAL_CAPSULE | Freq: Every day | ORAL | Status: DC
  Administered 2021-03-10: 6 mg via ORAL
  Administered 2021-03-11 – 2021-03-14 (×4): 7 mg via ORAL
  Filled 2021-03-09 (×5): qty 7

## 2021-03-09 NOTE — ED Triage Notes (Signed)
Pt reports via interpreter, fever, chills, generalized body aches and burning w/ urination that started yesterday.  Took tylenol taken w/o relief of symptoms.  Last dose of tylenol 500 mg at 0800 this AM.

## 2021-03-09 NOTE — Progress Notes (Signed)
A consult was received from an ED physician for cefepime per pharmacy dosing.  The patient's profile has been reviewed for ht/wt/allergies/indication/available labs.   A one time order has been placed for cefepime 2 gm.     Further antibiotics/pharmacy consults should be ordered by admitting physician if indicated.                       Thank you,  Eudelia Bunch, Pharm.D 03/09/2021 12:07 PM

## 2021-03-09 NOTE — ED Notes (Signed)
Patient transported to CT 

## 2021-03-09 NOTE — ED Notes (Signed)
Delay in antibiotic as it is not loaded in pyxis.  Pharmacy notified x 2.

## 2021-03-09 NOTE — Progress Notes (Signed)
Pharmacy Antibiotic Note  Katie Lyons is a 35 y.o. female admitted on 03/09/2021 with sepsis and UTI.  Pharmacy has been consulted for cefepime dosing.  Plan: Cefepime 2g IV q8 per current renal function  Height: '4\' 11"'$  (149.9 cm) Weight: 59 kg (130 lb) IBW/kg (Calculated) : 43.2  Temp (24hrs), Avg:101.3 F (38.5 C), Min:99.3 F (37.4 C), Max:102.4 F (39.1 C)  Recent Labs  Lab 03/09/21 1143  WBC 18.6*  CREATININE 0.85  LATICACIDVEN 1.2    Estimated Creatinine Clearance: 70.8 mL/min (by C-G formula based on SCr of 0.85 mg/dL).    No Known Allergies    Thank you for allowing pharmacy to be a part of this patient's care.  Kara Mead 03/09/2021 5:44 PM

## 2021-03-09 NOTE — H&P (Signed)
History and Physical    Katie Lyons [id number] DOB: [birth year] DOA: 03/09/2021  PCP: Fleet Contras, MD   Patient coming from: Home  Chief Complaint: Burning in the Urine and Whole Body Pain  HPI: Katie Lyons is a 35 y.o. female with medical history significant for but not limited to history of depression and anxiety, chronic ischemic heart disease, history of end-stage renal disease on dialysis in 2008 status post renal transplantation in September 2019 currently on Prograf and mycophenolate, hypertension, iron deficiency anemia, history of TBI, history of secondary hyperparathyroidism as well as other comorbidities who presented with a chief complaint of burning in the urine as well and whole body pain.  Patient was seen and examined at the interpreter [facility] #[id number] and the patient states that she developed fever, chills, generalized body aches and burning with urination that started this yesterday into this morning.  She took Tylenol without relief of her symptoms and her last dose of Tylenol was 8 AM this morning.  She was recently admitted to [facility] for pyelonephritis of the renal transplant and presented with concern for fever body aches as well as dysuria.  She describes body aches and fever that started last night into this morning and describes the pain 8 out of 10 in all over as well as a significant headache with photophobia.  She states that the pain with urination is improved with Tylenol and her appetite is okay.  She states that she vomited this morning but has not had any diarrhea or cough.  Complains of dry mouth and dry throat as well as sore throat.  She had no sick contacts noted.  TRH was asked admit this patient for sepsis secondary to acute pyelonephritis  ED Course: In the ED she is given 2 L of lactated ringer boluses as well as IV cefepime and given IV fentanyl and IV antibiotics with cefepime.  She was also given acetaminophen 650 mg.   COVID testing was negative.  Review of Systems: As per HPI otherwise all other systems reviewed and negative.   Past Medical History:  Diagnosis Date   Anxiety    Chronic ischemic heart disease    Depression    3-4 years ago per husband   End stage renal disease ([place])    Headache    Hypertension    Iron deficiency    Secondary hyperparathyroidism ([place])    Traumatic brain injury [facility])    Past Surgical History:  Procedure Laterality Date   ARTERIOVENOUS GRAFT PLACEMENT     AV FISTULA PLACEMENT Left 04/13/2016   Procedure: UPPER EXTREMITY LEFT  ARTERIOVENOUS (AV) FISTULA CREATION;  Surgeon: Waynetta Sandy, MD;  Location: [place];  Service: Vascular;  Laterality: Left;   [place] Right 04/10/2016   Procedure: REMOVAL OF Right Leg ARTERIOVENOUS GORETEX GRAFT ([place]);  Surgeon: Waynetta Sandy, MD;  Location: [place];  Service: Vascular;  Laterality: Right;   INSERTION OF DIALYSIS CATHETER Right 04/10/2016   Procedure: insertion Dialysis catheter;  Surgeon: Waynetta Sandy, MD;  Location: [place];  Service: Vascular;  Laterality: Right;   INSERTION OF DIALYSIS CATHETER Right 04/13/2016   Procedure: INSERTION OF DIALYSIS CATHETER RIGHT INTERNAL JUGULAR;  Surgeon: Waynetta Sandy, MD;  Location: [place];  Service: Vascular;  Laterality: Right;   Kidney Transplant x 2 months ago     NO PAST SURGERIES     REVISION OF ARTERIOVENOUS GORETEX GRAFT Right 01/18/2016   Procedure: REVISION OF ARTERIOVENOUS GORETEX GRAFT Right leg;  Surgeon: Rosetta Posner, MD;  Location: [place];  Service: Vascular;  Laterality: Right;   SOCIAL HISTORY  reports that she has never smoked. She has never used smokeless tobacco. She reports that she does not drink alcohol and does not use drugs.  ALLERGIES No Known Allergies  FAMILY HISTORY  History reviewed. No pertinent family history.  Prior to Admission medications   Medication Sig Start Date End Date Taking? Authorizing Provider   magnesium oxide (MAG-OX) 400 MG tablet Take 400 mg by mouth 2 (two) times daily.   Yes [provider]  mycophenolate (MYFORTIC) 180 MG EC tablet Take 360 mg by mouth See admin instructions. Take 4 tablets in the morning and 4 tablets in the evening. 10/09/18  Yes [provider]  predniSONE (DELTASONE) 5 MG tablet Take 1 tablet (5 mg total) by mouth daily. Resume after finishing steroid taper. 09/11/19  Yes Little Ishikawa, MD  sodium bicarbonate 650 MG tablet Take 2,600 mg by mouth 3 (three) times daily. Three tabs ($RemoveBef'1950mg'ERXsFIsrfJ$ ) three times daily 05/20/18  Yes [provider]  tacrolimus (PROGRAF) 1 MG capsule Take 6-7 mg by mouth See admin instructions. Take 7 tablets in the morning and 6 tablets at night. 10/07/18  Yes [provider]   Physical Exam: Vitals:   03/09/21 1700 03/09/21 1715 03/09/21 1730 03/09/21 1734  BP: 113/69 107/69 109/66   Pulse: (!) 101 97 96   Resp: (!) 33 (!) 30 (!) 29   Temp:    99.3 F (37.4 C)  TempSrc:    Oral  SpO2: 95% 96% 95%   Weight:      Height:       Constitutional: WN/WD overweight female in mild distress appears uncomfortable  Eyes: Lids and conjunctivae normal, sclerae anicteric  ENMT: External Ears, Nose appear normal. Grossly normal hearing.  Neck: Appears normal, supple, no cervical masses, normal ROM, no appreciable thyromegaly; no appreciable JVD Respiratory: Diminished to auscultation bilaterally, no wheezing, rales, rhonchi or crackles. Normal respiratory effort and patient is not tachypenic. No accessory muscle use.  Unlabored breathing Cardiovascular: RRR, no murmurs / rubs / gallops. S1 and S2 auscultated.  No appreciable extremity edema Abdomen: Soft, non-tender, distended secondary body habitus. Bowel sounds positive.  GU: Deferred. Musculoskeletal: No clubbing / cyanosis of digits/nails. No joint deformity upper and lower extremities.  Skin: No rashes, lesions, ulcers on limited skin evaluation. No  induration; Warm and dry.  Neurologic: CN 2-12 grossly intact with no focal deficits. Romberg sign cerebellar reflexes not assessed.  Psychiatric: Normal judgment and insight. Alert and oriented x 3. Normal mood and appropriate affect.   Labs on Admission: I have personally reviewed following labs and imaging studies  CBC: Recent Labs  Lab 03/09/21 1143  WBC 18.6*  NEUTROABS 17.1*  HGB 13.4  HCT 40.2  MCV 86.6  PLT 308   Basic Metabolic Panel: Recent Labs  Lab 03/09/21 1143  NA 135  K 3.5  CL 106  CO2 19*  GLUCOSE 141*  BUN 18  CREATININE 0.85  CALCIUM 9.8   GFR: Estimated Creatinine Clearance: 70.8 mL/min (by C-G formula based on SCr of 0.85 mg/dL). Liver Function Tests: Recent Labs  Lab 03/09/21 1143  AST 83*  ALT 106*  ALKPHOS 121  BILITOT 1.7*  PROT 7.3  ALBUMIN 4.1   No results for input(s): LIPASE, AMYLASE in the last 168 hours. No results for input(s): AMMONIA in the last 168 hours. Coagulation Profile: Recent Labs  Lab 03/09/21 1143  INR 1.1   Cardiac Enzymes: No results for input(s): CKTOTAL, CKMB, CKMBINDEX, TROPONINI in the last 168 hours. BNP (last 3 results) No results for input(s): PROBNP in the last 8760 hours. HbA1C: No results for input(s): HGBA1C in the last 72 hours. CBG: No results for input(s): GLUCAP in the last 168 hours. Lipid Profile: No results for input(s): CHOL, HDL, LDLCALC, TRIG, CHOLHDL, LDLDIRECT in the last 72 hours. Thyroid Function Tests: No results for input(s): TSH, T4TOTAL, FREET4, T3FREE, THYROIDAB in the last 72 hours. Anemia Panel: No results for input(s): VITAMINB12, FOLATE, FERRITIN, TIBC, IRON, RETICCTPCT in the last 72 hours. Urine analysis:    Component Value Date/Time   COLORURINE YELLOW 03/09/2021 1143   APPEARANCEUR HAZY (A) 03/09/2021 1143   LABSPEC 1.013 03/09/2021 1143   PHURINE 5.0 03/09/2021 1143   GLUCOSEU NEGATIVE 03/09/2021 1143   HGBUR MODERATE (A) 03/09/2021 1143   BILIRUBINUR NEGATIVE  03/09/2021 1143   KETONESUR NEGATIVE 03/09/2021 1143   PROTEINUR 30 (A) 03/09/2021 1143   UROBILINOGEN 0.2 12/09/2013 1556   NITRITE NEGATIVE 03/09/2021 1143   LEUKOCYTESUR MODERATE (A) 03/09/2021 1143   Sepsis Labs: !!!!!!!!!!!!!!!!!!!!!!!!!!!!!!!!!!!!!!!!!!!! @LABRCNTIP (procalcitonin:4,lacticidven:4) ) Recent Results (from the past 240 hour(s))  Resp Panel by RT-PCR (Flu A&B, Covid) Nasopharyngeal Swab     Status: None   Collection Time: 03/09/21 12:09 PM   Specimen: Nasopharyngeal Swab; Nasopharyngeal(NP) swabs in vial transport medium  Result Value Ref Range Status   SARS Coronavirus 2 by RT PCR NEGATIVE NEGATIVE Final    Comment: (NOTE) SARS-CoV-2 target nucleic acids are NOT DETECTED.  The SARS-CoV-2 RNA is generally detectable in upper respiratory specimens during the acute phase of infection. The lowest concentration of SARS-CoV-2 viral copies this assay can detect is 138 copies/mL. A negative result does not preclude SARS-Cov-2 infection and should not be used as the sole basis for treatment or other patient management decisions. A negative result may occur with  improper specimen collection/handling, submission of specimen other than nasopharyngeal swab, presence of viral mutation(s) within the areas targeted by this assay, and inadequate number of viral copies(<138 copies/mL). A negative result must be combined with clinical observations, patient history, and epidemiological information. The expected result is Negative.  Fact Sheet for Patients:  EntrepreneurPulse.com.au  Fact Sheet for Healthcare Providers:  IncredibleEmployment.be  This test is no t yet approved or cleared by the [place] FDA and  has been authorized for detection and/or diagnosis of SARS-CoV-2 by FDA under an Emergency Use Authorization (EUA). This EUA will remain  in effect (meaning this test can be used) for the duration of the COVID-19 declaration  under Section 564(b)(1) of the Act, 21 U.S.C.section 360bbb-3(b)(1), unless the authorization is terminated  or revoked sooner.       Influenza A by PCR NEGATIVE NEGATIVE Final   Influenza B by PCR NEGATIVE NEGATIVE Final    Comment: (NOTE) The Xpert Xpress SARS-CoV-2/FLU/RSV plus assay is intended as an aid in the diagnosis of influenza from Nasopharyngeal swab specimens and should not be used as a sole basis for treatment. Nasal washings and aspirates are unacceptable for Xpert Xpress SARS-CoV-2/FLU/RSV testing.  Fact Sheet for Patients: EntrepreneurPulse.com.au  Fact Sheet for Healthcare Providers: IncredibleEmployment.be  This test is not yet approved or cleared by the [place] FDA and has been authorized for detection and/or diagnosis of SARS-CoV-2 by FDA under an Emergency Use Authorization (EUA). This EUA will remain in effect (meaning this test can be used) for the duration of the COVID-19 declaration under Section  564(b)(1) of the Act, 21 U.S.C. section 360bbb-3(b)(1), unless the authorization is terminated or revoked.  Performed at [facility], [place] [address] [place], [place] [address]   Group A Strep by PCR     Status: None   Collection Time: 03/09/21  1:05 PM   Specimen: Throat; Sterile Swab  Result Value Ref Range Status   Group A Strep by PCR NOT DETECTED NOT DETECTED Final    Comment: Performed at [facility], [place] [address] [place], [place] [address]     Radiological Exams on Admission: CT HEAD WO CONTRAST  Result Date: 03/09/2021 CLINICAL DATA:  Headache, fever EXAM: CT HEAD WITHOUT CONTRAST TECHNIQUE: Contiguous axial images were obtained from the base of the skull through the vertex without intravenous contrast. COMPARISON:  11/14/2012 FINDINGS: Brain: No acute intracranial abnormality. Specifically, no hemorrhage, hydrocephalus, mass lesion, acute infarction, or significant  intracranial injury. Area of encephalomalacia within the anterior right frontal lobe in area of previously seen hemorrhage. Vascular: No hyperdense vessel or unexpected calcification. Skull: No acute calvarial abnormality. Sinuses/Orbits: No acute findings Other: None IMPRESSION: No acute intracranial abnormality. Electronically Signed   By: Rolm Baptise M.D.   On: 03/09/2021 16:57   DG Chest Port 1 View  Result Date: 03/09/2021 CLINICAL DATA:  Questionable sepsis, fever and chills and body aches. Tylenol without relief of symptoms. EXAM: PORTABLE CHEST 1 VIEW COMPARISON:  August 31, 2019. FINDINGS: EKG leads projecting over the chest. Cardiomediastinal contours with persistent cardiac enlargement and pulmonary vascular engorgement. No lobar consolidative changes. No sign of pleural effusion. No visible pneumothorax. On limited assessment there is no acute skeletal process. IMPRESSION: 1. Cardiomegaly with persistent pulmonary vascular engorgement. 2. No acute cardiopulmonary disease. Electronically Signed   By: Zetta Bills M.D.   On: 03/09/2021 12:49   CT Renal Stone Study  Result Date: 03/09/2021 CLINICAL DATA:  Fever, chills, body aches and burning with urination since yesterday. EXAM: CT ABDOMEN AND PELVIS WITHOUT CONTRAST TECHNIQUE: Multidetector CT imaging of the abdomen and pelvis was performed following the standard protocol without IV contrast. COMPARISON:  CT abdomen and pelvis 01/22/2021. FINDINGS: Lower chest: Lung bases demonstrate mild dependent atelectasis. Heart size is normal. No pleural or pericardial effusion. Hepatobiliary: No focal liver abnormality is seen. No gallstones, gallbladder wall thickening, or biliary dilatation. Pancreas: Unremarkable. No pancreatic ductal dilatation or surrounding inflammatory changes. Spleen: Normal in size without focal abnormality. Adrenals/Urinary Tract: Adrenal glands appear normal. Markedly atrophic native kidneys are unchanged. The patient has a  right lower quadrant renal transplant. There is stranding and some fluid about the transplant kidney which are improved compared to the prior CT. No hydronephrosis or stone. There is no focal perinephric fluid collection. Stomach/Bowel: Stomach is within normal limits. Appendix appears normal. No evidence of bowel wall thickening, distention, or inflammatory changes. Vascular/Lymphatic: Aortic atherosclerosis. No enlarged abdominal or pelvic lymph nodes. Reproductive: Uterus and bilateral adnexa are unremarkable. Other: Small volume of free pelvic fluid is compatible with physiologic change. Musculoskeletal: Negative. IMPRESSION: Although stranding and fluid about the patient's right lower quadrant renal transplant are improved compared to the prior CT, findings could be due to pyelonephritis. Negative for hydronephrosis or stone. Aortic Atherosclerosis (ICD10-I70.0). Electronically Signed   By: Inge Rise M.D.   On: 03/09/2021 13:31    EKG: Independently reviewed. Showed Sinus Tachycardia with a rate of 103 and Qtc 425.   Assessment/Plan Active Problems:   Pyelonephritis  Sepsis secondary to pyelonephritis in the setting of her  transplanted kidney -Unclear as she recovered from her infection in June -Met sepsis physiology on admission with tachycardia, tachypnea as well as a fever and leukocytosis associated with urinary tract symptoms -Urinalysis done and showed a hazy appearance with moderate hemoglobin, moderate leukocytes, negative nitrates, few bacteria, 6-10 RBCs per high-power field, greater than 50 WBCs with urine culture and blood cultures pending -CT Renal Stone Study done and showed "Although stranding and fluid about the patient's right lower quadrant renal transplant are improved compared to the prior CT, findings could be due to pyelonephritis. Negative for hydronephrosis or stone. Aortic Atherosclerosis." -Given 3 Liters of Boluses and started on mIVF with NS at 100  mL/hr -Continue with IV antibiotics with cefepime and if not improving may need to escalate to meropenem -Discussed with urology and they feel that there is no obstruction so there is nothing for them to do.  Also discussed with nephrology who feels there is no indication to stop patient's antirejection drugs -Acetaminophen and Ibuprofen for Fever  -C/w Supportive Care   Hx of ESRD now with Renal Transplant Metabolic Acidosis  -C/w Renal Transplant Meds with Tacrolimus and Mycophenolate and with p.o. prednisone 5 mg; may need stress dosing -Had a small Acidosis C/w Sodium Bicarbonate 1950 mg po TID  -IVF as above  Abnormal LFTs -In the setting of sepsis and infection -AST was 83 and ALT was 106 -Continue to Monitor and Trend   Headache -Had Photophobia -No Nucal Rigidity -Continue to Monitor for Meningitis -Head CT done and showed "No acute intracranial abnormality." -Will provide Fiorcet   DVT prophylaxis: Heparin 5,000 units sq q8h Code Status: FULL CODE  Family Communication: No family present at bedside  Disposition Plan: Pending further clinical improvement  Consults called: Discussed with Dr. Jonnie Finner of Nephrology and Dr. Alinda Money of Urology  Admission status: Inpatient SDU  Severity of Illness: The appropriate patient status for this patient is INPATIENT. Inpatient status is judged to be reasonable and necessary in order to provide the required intensity of service to ensure the patient's safety. The patient's presenting symptoms, physical exam findings, and initial radiographic and laboratory data in the context of their chronic comorbidities is felt to place them at high risk for further clinical deterioration. Furthermore, it is not anticipated that the patient will be medically stable for discharge from the hospital within 2 midnights of admission. The following factors support the patient status of inpatient.   " The patient's presenting symptoms include Burning on  Urination. " The worrisome physical exam findings include mildly dry mm. " The initial radiographic and laboratory data are worrisome because of Pyelonephritis. " The chronic co-morbidities are as above.  * I certify that at the point of admission it is my clinical judgment that the patient will require inpatient hospital care spanning beyond 2 midnights from the point of admission due to high intensity of service, high risk for further deterioration and high frequency of surveillance required.Kerney Elbe, D.O. [facility] is on [place]  If 7PM-7AM, please contact night-coverage [email or web address]  03/09/2021, 5:36 PM
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
## 2021-03-10 DIAGNOSIS — B9620 Unspecified Escherichia coli [E. coli] as the cause of diseases classified elsewhere: Secondary | ICD-10-CM

## 2021-03-10 DIAGNOSIS — A4151 Sepsis due to Escherichia coli [E. coli]: Secondary | ICD-10-CM

## 2021-03-10 DIAGNOSIS — N390 Urinary tract infection, site not specified: Secondary | ICD-10-CM

## 2021-03-10 LAB — CBC WITH DIFFERENTIAL/PLATELET
Abs Immature Granulocytes: 0.08 10*3/uL — ABNORMAL HIGH (ref 0.00–0.07)
Basophils Absolute: 0 10*3/uL (ref 0.0–0.1)
Basophils Relative: 0 %
Eosinophils Absolute: 0.1 10*3/uL (ref 0.0–0.5)
Eosinophils Relative: 1 %
HCT: 36.1 % (ref 36.0–46.0)
Hemoglobin: 11.7 g/dL — ABNORMAL LOW (ref 12.0–15.0)
Immature Granulocytes: 1 %
Lymphocytes Relative: 10 %
Lymphs Abs: 1.5 10*3/uL (ref 0.7–4.0)
MCH: 29 pg (ref 26.0–34.0)
MCHC: 32.4 g/dL (ref 30.0–36.0)
MCV: 89.4 fL (ref 80.0–100.0)
Monocytes Absolute: 1.2 10*3/uL — ABNORMAL HIGH (ref 0.1–1.0)
Monocytes Relative: 8 %
Neutro Abs: 12.1 10*3/uL — ABNORMAL HIGH (ref 1.7–7.7)
Neutrophils Relative %: 80 %
Platelets: 130 10*3/uL — ABNORMAL LOW (ref 150–400)
RBC: 4.04 MIL/uL (ref 3.87–5.11)
RDW: 14.2 % (ref 11.5–15.5)
WBC: 15 10*3/uL — ABNORMAL HIGH (ref 4.0–10.5)
nRBC: 0 % (ref 0.0–0.2)

## 2021-03-10 LAB — GLUCOSE, CAPILLARY: Glucose-Capillary: 114 mg/dL — ABNORMAL HIGH (ref 70–99)

## 2021-03-10 LAB — PHOSPHORUS: Phosphorus: 2.4 mg/dL — ABNORMAL LOW (ref 2.5–4.6)

## 2021-03-10 LAB — COMPREHENSIVE METABOLIC PANEL
ALT: 60 U/L — ABNORMAL HIGH (ref 0–44)
AST: 31 U/L (ref 15–41)
Albumin: 3 g/dL — ABNORMAL LOW (ref 3.5–5.0)
Alkaline Phosphatase: 82 U/L (ref 38–126)
Anion gap: 3 — ABNORMAL LOW (ref 5–15)
BUN: 14 mg/dL (ref 6–20)
CO2: 23 mmol/L (ref 22–32)
Calcium: 8.2 mg/dL — ABNORMAL LOW (ref 8.9–10.3)
Chloride: 114 mmol/L — ABNORMAL HIGH (ref 98–111)
Creatinine, Ser: 0.78 mg/dL (ref 0.44–1.00)
GFR, Estimated: >60 mL/min (ref >60)
Glucose, Bld: 109 mg/dL — ABNORMAL HIGH (ref 70–99)
Potassium: 4.3 mmol/L (ref 3.5–5.1)
Sodium: 140 mmol/L (ref 135–145)
Total Bilirubin: 0.9 mg/dL (ref 0.3–1.2)
Total Protein: 5.5 g/dL — ABNORMAL LOW (ref 6.5–8.1)

## 2021-03-10 LAB — MAGNESIUM: Magnesium: 1.2 mg/dL — ABNORMAL LOW (ref 1.7–2.4)

## 2021-03-10 LAB — CORTISOL: Cortisol, Plasma: 19.6 ug/dL

## 2021-03-10 MED ORDER — K PHOS MONO-SOD PHOS DI & MONO 155-852-130 MG PO TABS
500.0000 mg | ORAL_TABLET | Freq: Once | ORAL | Status: AC
  Administered 2021-03-10: 500 mg via ORAL
  Filled 2021-03-10: qty 2

## 2021-03-10 MED ORDER — DIPHENHYDRAMINE HCL 25 MG PO CAPS
25.0000 mg | ORAL_CAPSULE | Freq: Once | ORAL | Status: AC | PRN
  Administered 2021-03-11: 25 mg via ORAL
  Filled 2021-03-10: qty 1

## 2021-03-10 MED ORDER — MAGNESIUM SULFATE 4 GM/100ML IV SOLN
4.0000 g | Freq: Once | INTRAVENOUS | Status: AC
  Administered 2021-03-10: 4 g via INTRAVENOUS
  Filled 2021-03-10: qty 100

## 2021-03-10 MED ORDER — CHLORHEXIDINE GLUCONATE CLOTH 2 % EX PADS
6.0000 | MEDICATED_PAD | Freq: Every day | CUTANEOUS | Status: DC
  Administered 2021-03-10 – 2021-03-14 (×5): 6 via TOPICAL
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
## 2021-03-10 NOTE — Progress Notes (Signed)
PROGRESS NOTE    Katie Lyons  [id number] DOB: July 07, 1986 DOA: 03/09/2021 PCP: Fleet Contras, MD   Brief Narrative:  Katie Lyons is a 35 y.o. female with medical history significant for but not limited to history of depression and anxiety, chronic ischemic heart disease, history of end-stage renal disease on dialysis in 2008 status post renal transplantation in September 2019 currently on Prograf and mycophenolate, hypertension, iron deficiency anemia, history of TBI, history of secondary hyperparathyroidism as well as other comorbidities who presented with a chief complaint of burning in the urine as well and whole body pain.  Patient was seen and examined at the interpreter Vicente Males #[id number] and the patient states that she developed fever, chills, generalized body aches and burning with urination that started this yesterday into this morning.  She took Tylenol without relief of her symptoms and her last dose of Tylenol was 8 AM this morning.  She was recently admitted to [facility] for pyelonephritis of the renal transplant and presented with concern for fever body aches as well as dysuria.  She describes body aches and fever that started last night into this morning and describes the pain 8 out of 10 in all over as well as a significant headache with photophobia.  She states that the pain with urination is improved with Tylenol and her appetite is okay.  She states that she vomited this morning but has not had any diarrhea or cough.  Complains of dry mouth and dry throat as well as sore throat.  She had no sick contacts noted.  TRH was asked admit this patient for sepsis secondary to acute pyelonephritis   ED Course: In the ED she is given 2 L of lactated ringer boluses as well as IV cefepime and given IV fentanyl and IV antibiotics with cefepime.  She was also given acetaminophen 650 mg.  COVID testing was negative.  **Interim History  Sepsis Physiology is  improving and she is feeling better. WBC is improving and Urine Cx growing >100,000 CFU of E Coli with Sensitivities pending .   Assessment & Plan:   Active Problems:   Pyelonephritis  Sepsis secondary to E Coli pyelonephritis in the setting of her transplanted kidney -Unclear as she recovered from her infection in June -Met sepsis physiology on admission with tachycardia, tachypnea as well as a fever and leukocytosis associated with urinary tract symptoms; Has an E Coli UTI  -Urinalysis done and showed a hazy appearance with moderate hemoglobin, moderate leukocytes, negative nitrates, few bacteria, 6-10 RBCs per high-power field, greater than 50 WBCs with urine culture showing >100,000 E Coli with Sensitivities pending. Blood culture x2 pending  -CT Renal Stone Study done and showed "Although stranding and fluid about the patient's right lower quadrant renal transplant are improved compared to the prior CT, findings could be due to pyelonephritis. Negative for hydronephrosis or stone. Aortic Atherosclerosis." -WBC went from 18.6 -> 15.0 -Given 3 Liters of Boluses and started on mIVF with NS at 100 mL/hr but will reduce to 75 mL/hr -Continue with IV antibiotics with cefepime and if not improving may need to escalate to meropenem -Discussed with urology and they feel that there is no obstruction so there is nothing for them to do.  Also discussed with nephrology who feels there is no indication to stop patient's antirejection drugs -Acetaminophen and Ibuprofen for Fever -C/w Supportive Care   Hx of ESRD now with Renal Transplant Metabolic Acidosis -C/w Renal Transplant Meds with Tacrolimus and Mycophenolate and with  p.o. prednisone 5 mg; may need stress dosing -Had a small Acidosis C/w Sodium Bicarbonate 1950 mg po TID -IVF as above  Hypomagnesemia -Patient's Mag Level is 1.2 -Replete with IV Mag Sulfate 4 grams -Continue to Monitor and Replete as Necessary -Repeat Mag Level in the  Am  Hypophosphatemia -Patient's Phos Level is 2.4 -Replete with po K Phos 500 mg x1 -Continue to Monitor and Replete as Necessary -Repeat Phos Level in the AM    Abnormal LFTs -In the setting of sepsis and infection -AST was 83 and ALT was 106 on Admission; Now AST is 31 and ALT is 60 -Continue to Monitor and Trend  -Repeat CMP in the AM    Headache, improved  -Had Photophobia -No Nucal Rigidity -Continue to Monitor for Meningitis -Head CT done and showed "No acute intracranial abnormality." -Will provide Fiorcet   Normocytic Anemia -Patient's Hgb/Hct went from 13.4/40.2 -> 11.7/36.1 -Check Anemia Panel in the AM  -Likely Dilutional Drop -Continue to Monitor for S/Sx of Bleeding; No overt Bleeding noted -Repeat CBC in the AM   Thrombocytopenia -Patient's Platelet Count went from 163 -> 130 -Continue to Monitor for S/Sx of Bleeding; No overt bleeding noted -Repeat CBC in the AM   Low TSH -TSH was 0.175 -Check Free T4 and T3    DVT prophylaxis: Heparin 5,000 unit sq q8h Code Status: FULL CODE  Family Communication: No family present at bedside  Disposition Plan: Pending further clinical improvement   Status is: Inpatient  Remains inpatient appropriate because:Unsafe d/c plan, IV treatments appropriate due to intensity of illness or inability to take PO, and Inpatient level of care appropriate due to severity of illness  Dispo: The patient is from: Home              Anticipated d/c is to: Home              Patient currently is not medically stable to d/c.   Difficult to place patient No  Consultants:  Discussed with Nephrology and Urology   Procedures: None  Antimicrobials:  Anti-infectives (From admission, onward)    Start     Dose/Rate Route Frequency Ordered Stop   03/09/21 2200  ceFEPIme (MAXIPIME) 2 g in sodium chloride 0.9 % 100 mL IVPB        2 g 200 mL/hr over 30 Minutes Intravenous Every 8 hours 03/09/21 1746     03/09/21 1200  ceFEPIme (MAXIPIME)  2 g in sodium chloride 0.9 % 100 mL IVPB        2 g 200 mL/hr over 30 Minutes Intravenous  Once 03/09/21 1153 03/09/21 1352   03/09/21 1145  cefTRIAXone (ROCEPHIN) 1 g in sodium chloride 0.9 % 100 mL IVPB  Status:  Discontinued        1 g 200 mL/hr over 30 Minutes Intravenous Every 24 hours 03/09/21 1144 03/09/21 1145        Subjective: Seen and examined at bedside and she is doing much better today.  She is seen with assistance of the Spanish translator Leda Quail [phone].  States that she had a headache earlier but is much improved now.  Feels better and ambulating without issues.  Denies any chest pain or shortness of breath.  States her urinary discomfort and abdominal pain is improved.  Objective: Vitals:   03/10/21 0300 03/10/21 0400 03/10/21 0500 03/10/21 0600  BP: (!) 91/53 (!) 91/53 (!) 89/52 104/63  Pulse: 65 92 70 72  Resp: 17 19 (!) 23 (!) 31  Temp:  98.1 F (36.7 C)    TempSrc:  Oral    SpO2: 94% 98% 98% 99%  Weight:   61 kg   Height:        Intake/Output Summary (Last 24 hours) at 03/10/2021 0908 Last data filed at 03/10/2021 0200 Gross per 24 hour  Intake 4917.31 ml  Output --  Net 4917.31 ml   Filed Weights   03/09/21 1118 03/10/21 0500  Weight: 59 kg 61 kg   Examination: Physical Exam:  Constitutional: WN/WD overweight Hispanic female in NAD and appears calm and comfortable Eyes:  Lids and conjunctivae normal, sclerae anicteric  ENMT: External Ears, Nose appear normal. Grossly normal hearing.  Neck: Appears normal, supple, no cervical masses, normal ROM, no appreciable thyromegaly; no JVD Respiratory: Diminished to auscultation bilaterally, no wheezing, rales, rhonchi or crackles. Normal respiratory effort and patient is not tachypenic. No accessory muscle use. Unlabored breathing  Cardiovascular: RRR, no murmurs / rubs / gallops. S1 and S2 auscultated. No extremity edema.  Abdomen: Soft, non-tender, Distended 2/2 body habitus. Bowel sounds positive.  GU:  Deferred. Musculoskeletal: No clubbing / cyanosis of digits/nails. No joint deformity upper and lower extremities.  Skin: No rashes, lesions, ulcers on a limited skin evaluation. No induration; Warm and dry.  Neurologic: CN 2-12 grossly intact with no focal deficits. Romberg sign and cerebellar reflexes not assessed.  Psychiatric: Normal judgment and insight. Alert and oriented x 3. Normal mood and appropriate affect.   Data Reviewed: I have personally reviewed following labs and imaging studies  CBC: Recent Labs  Lab 03/09/21 1143 03/10/21 0313  WBC 18.6* 15.0*  NEUTROABS 17.1* 12.1*  HGB 13.4 11.7*  HCT 40.2 36.1  MCV 86.6 89.4  PLT 163 010*   Basic Metabolic Panel: Recent Labs  Lab 03/09/21 1143 03/10/21 0313  NA 135 140  K 3.5 4.3  CL 106 114*  CO2 19* 23  GLUCOSE 141* 109*  BUN 18 14  CREATININE 0.85 0.78  CALCIUM 9.8 8.2*  MG  --  1.2*  PHOS  --  2.4*   GFR: Estimated Creatinine Clearance: 76.5 mL/min (by C-G formula based on SCr of 0.78 mg/dL). Liver Function Tests: Recent Labs  Lab 03/09/21 1143 03/10/21 0313  AST 83* 31  ALT 106* 60*  ALKPHOS 121 82  BILITOT 1.7* 0.9  PROT 7.3 5.5*  ALBUMIN 4.1 3.0*   No results for input(s): LIPASE, AMYLASE in the last 168 hours. No results for input(s): AMMONIA in the last 168 hours. Coagulation Profile: Recent Labs  Lab 03/09/21 1143 03/09/21 1846  INR 1.1 1.2   Cardiac Enzymes: No results for input(s): CKTOTAL, CKMB, CKMBINDEX, TROPONINI in the last 168 hours. BNP (last 3 results) No results for input(s): PROBNP in the last 8760 hours. HbA1C: No results for input(s): HGBA1C in the last 72 hours. CBG: Recent Labs  Lab 03/10/21 0817  GLUCAP 114*   Lipid Profile: No results for input(s): CHOL, HDL, LDLCALC, TRIG, CHOLHDL, LDLDIRECT in the last 72 hours. Thyroid Function Tests: Recent Labs    03/09/21 1846  TSH 0.175*   Anemia Panel: No results for input(s): VITAMINB12, FOLATE, FERRITIN, TIBC,  IRON, RETICCTPCT in the last 72 hours. Sepsis Labs: Recent Labs  Lab 03/09/21 1143 03/09/21 1846 03/09/21 2012  LATICACIDVEN 1.2 1.0 1.2    Recent Results (from the past 240 hour(s))  Resp Panel by RT-PCR (Flu A&B, Covid) Nasopharyngeal Swab     Status: None   Collection Time: 03/09/21 12:09 PM  Specimen: Nasopharyngeal Swab; Nasopharyngeal(NP) swabs in vial transport medium  Result Value Ref Range Status   SARS Coronavirus 2 by RT PCR NEGATIVE NEGATIVE Final    Comment: (NOTE) SARS-CoV-2 target nucleic acids are NOT DETECTED.  The SARS-CoV-2 RNA is generally detectable in upper respiratory specimens during the acute phase of infection. The lowest concentration of SARS-CoV-2 viral copies this assay can detect is 138 copies/mL. A negative result does not preclude SARS-Cov-2 infection and should not be used as the sole basis for treatment or other patient management decisions. A negative result may occur with  improper specimen collection/handling, submission of specimen other than nasopharyngeal swab, presence of viral mutation(s) within the areas targeted by this assay, and inadequate number of viral copies(<138 copies/mL). A negative result must be combined with clinical observations, patient history, and epidemiological information. The expected result is Negative.  Fact Sheet for Patients:  EntrepreneurPulse.com.au  Fact Sheet for Healthcare Providers:  IncredibleEmployment.be  This test is no t yet approved or cleared by the [place] FDA and  has been authorized for detection and/or diagnosis of SARS-CoV-2 by FDA under an Emergency Use Authorization (EUA). This EUA will remain  in effect (meaning this test can be used) for the duration of the COVID-19 declaration under Section 564(b)(1) of the Act, 21 U.S.C.section 360bbb-3(b)(1), unless the authorization is terminated  or revoked sooner.       Influenza A by PCR NEGATIVE  NEGATIVE Final   Influenza B by PCR NEGATIVE NEGATIVE Final    Comment: (NOTE) The Xpert Xpress SARS-CoV-2/FLU/RSV plus assay is intended as an aid in the diagnosis of influenza from Nasopharyngeal swab specimens and should not be used as a sole basis for treatment. Nasal washings and aspirates are unacceptable for Xpert Xpress SARS-CoV-2/FLU/RSV testing.  Fact Sheet for Patients: EntrepreneurPulse.com.au  Fact Sheet for Healthcare Providers: IncredibleEmployment.be  This test is not yet approved or cleared by the [place] FDA and has been authorized for detection and/or diagnosis of SARS-CoV-2 by FDA under an Emergency Use Authorization (EUA). This EUA will remain in effect (meaning this test can be used) for the duration of the COVID-19 declaration under Section 564(b)(1) of the Act, 21 U.S.C. section 360bbb-3(b)(1), unless the authorization is terminated or revoked.  Performed at [facility], [place] [address] [place], [place] [address]   Group A Strep by PCR     Status: None   Collection Time: 03/09/21  1:05 PM   Specimen: Throat; Sterile Swab  Result Value Ref Range Status   Group A Strep by PCR NOT DETECTED NOT DETECTED Final    Comment: Performed at [facility], [place] [address], [place], [place] [address]    RN Pressure Injury Documentation:     Estimated body mass index is 27.16 kg/m as calculated from the following:   Height as of this encounter: _0  (1.499 m).   Weight as of this encounter: 61 kg.  Malnutrition Type:   Malnutrition Characteristics:   Nutrition Interventions:   Radiology Studies: CT HEAD WO CONTRAST  Result Date: 03/09/2021 CLINICAL DATA:  Headache, fever EXAM: CT HEAD WITHOUT CONTRAST TECHNIQUE: Contiguous axial images were obtained from the base of the skull through the vertex without intravenous contrast. COMPARISON:  11/14/2012 FINDINGS: Brain: No acute  intracranial abnormality. Specifically, no hemorrhage, hydrocephalus, mass lesion, acute infarction, or significant intracranial injury. Area of encephalomalacia within the anterior right frontal lobe in area of previously seen hemorrhage. Vascular: No hyperdense vessel or unexpected calcification. Skull:  No acute calvarial abnormality. Sinuses/Orbits: No acute findings Other: None IMPRESSION: No acute intracranial abnormality. Electronically Signed   By: Rolm Baptise M.D.   On: 03/09/2021 16:57   DG Chest Port 1 View  Result Date: 03/09/2021 CLINICAL DATA:  Questionable sepsis, fever and chills and body aches. Tylenol without relief of symptoms. EXAM: PORTABLE CHEST 1 VIEW COMPARISON:  August 31, 2019. FINDINGS: EKG leads projecting over the chest. Cardiomediastinal contours with persistent cardiac enlargement and pulmonary vascular engorgement. No lobar consolidative changes. No sign of pleural effusion. No visible pneumothorax. On limited assessment there is no acute skeletal process. IMPRESSION: 1. Cardiomegaly with persistent pulmonary vascular engorgement. 2. No acute cardiopulmonary disease. Electronically Signed   By: Zetta Bills M.D.   On: 03/09/2021 12:49   CT Renal Stone Study  Result Date: 03/09/2021 CLINICAL DATA:  Fever, chills, body aches and burning with urination since yesterday. EXAM: CT ABDOMEN AND PELVIS WITHOUT CONTRAST TECHNIQUE: Multidetector CT imaging of the abdomen and pelvis was performed following the standard protocol without IV contrast. COMPARISON:  CT abdomen and pelvis 01/22/2021. FINDINGS: Lower chest: Lung bases demonstrate mild dependent atelectasis. Heart size is normal. No pleural or pericardial effusion. Hepatobiliary: No focal liver abnormality is seen. No gallstones, gallbladder wall thickening, or biliary dilatation. Pancreas: Unremarkable. No pancreatic ductal dilatation or surrounding inflammatory changes. Spleen: Normal in size without focal abnormality.  Adrenals/Urinary Tract: Adrenal glands appear normal. Markedly atrophic native kidneys are unchanged. The patient has a right lower quadrant renal transplant. There is stranding and some fluid about the transplant kidney which are improved compared to the prior CT. No hydronephrosis or stone. There is no focal perinephric fluid collection. Stomach/Bowel: Stomach is within normal limits. Appendix appears normal. No evidence of bowel wall thickening, distention, or inflammatory changes. Vascular/Lymphatic: Aortic atherosclerosis. No enlarged abdominal or pelvic lymph nodes. Reproductive: Uterus and bilateral adnexa are unremarkable. Other: Small volume of free pelvic fluid is compatible with physiologic change. Musculoskeletal: Negative. IMPRESSION: Although stranding and fluid about the patient's right lower quadrant renal transplant are improved compared to the prior CT, findings could be due to pyelonephritis. Negative for hydronephrosis or stone. Aortic Atherosclerosis (ICD10-I70.0). Electronically Signed   By: Inge Rise M.D.   On: 03/09/2021 13:31    Scheduled Meds:  Chlorhexidine Gluconate Cloth  6 each Topical Q0600   docusate sodium  100 mg Oral BID   heparin  5,000 Units Subcutaneous Q8H   mycophenolate  720 mg Oral BID   phosphorus  500 mg Oral Once   predniSONE  5 mg Oral QAC breakfast   sodium bicarbonate  1,950 mg Oral TID   tacrolimus  6 mg Oral QHS   tacrolimus  7 mg Oral Daily   Continuous Infusions:  0.9 % NaCl with KCl 20 mEq / L 100 mL/hr at 03/10/21 0200   ceFEPime (MAXIPIME) IV Stopped (03/10/21 0177)   magnesium sulfate bolus IVPB      LOS: 1 day   Kerney Elbe, DO [facility] PAGER is on AMION  If 7PM-7AM, please contact night-coverage [email or web address]

## 2021-03-11 ENCOUNTER — Inpatient Hospital Stay (HOSPITAL_COMMUNITY): Payer: BLUE CROSS/BLUE SHIELD

## 2021-03-11 DIAGNOSIS — N12 Tubulo-interstitial nephritis, not specified as acute or chronic: Secondary | ICD-10-CM | POA: Diagnosis not present

## 2021-03-11 DIAGNOSIS — Z1612 Extended spectrum beta lactamase (ESBL) resistance: Secondary | ICD-10-CM

## 2021-03-11 DIAGNOSIS — B9629 Other Escherichia coli [E. coli] as the cause of diseases classified elsewhere: Secondary | ICD-10-CM

## 2021-03-11 DIAGNOSIS — D509 Iron deficiency anemia, unspecified: Secondary | ICD-10-CM

## 2021-03-11 HISTORY — PX: IR US GUIDE VASC ACCESS RIGHT: IMG2390

## 2021-03-11 HISTORY — PX: IR FLUORO GUIDE CV LINE RIGHT: IMG2283

## 2021-03-11 LAB — FERRITIN: Ferritin: 186 ng/mL (ref 11–307)

## 2021-03-11 LAB — COMPREHENSIVE METABOLIC PANEL
ALT: 52 U/L — ABNORMAL HIGH (ref 0–44)
AST: 27 U/L (ref 15–41)
Albumin: 3.4 g/dL — ABNORMAL LOW (ref 3.5–5.0)
Alkaline Phosphatase: 109 U/L (ref 38–126)
Anion gap: 7 (ref 5–15)
BUN: 11 mg/dL (ref 6–20)
CO2: 21 mmol/L — ABNORMAL LOW (ref 22–32)
Calcium: 8.9 mg/dL (ref 8.9–10.3)
Chloride: 107 mmol/L (ref 98–111)
Creatinine, Ser: 0.91 mg/dL (ref 0.44–1.00)
GFR, Estimated: >60 mL/min (ref >60)
Glucose, Bld: 130 mg/dL — ABNORMAL HIGH (ref 70–99)
Potassium: 3.9 mmol/L (ref 3.5–5.1)
Sodium: 135 mmol/L (ref 135–145)
Total Bilirubin: 0.8 mg/dL (ref 0.3–1.2)
Total Protein: 6.6 g/dL (ref 6.5–8.1)

## 2021-03-11 LAB — CBC WITH DIFFERENTIAL/PLATELET
Abs Immature Granulocytes: 0.04 10*3/uL (ref 0.00–0.07)
Basophils Absolute: 0 10*3/uL (ref 0.0–0.1)
Basophils Relative: 0 %
Eosinophils Absolute: 0.1 10*3/uL (ref 0.0–0.5)
Eosinophils Relative: 0 %
HCT: 37.5 % (ref 36.0–46.0)
Hemoglobin: 12 g/dL (ref 12.0–15.0)
Immature Granulocytes: 0 %
Lymphocytes Relative: 10 %
Lymphs Abs: 1.2 10*3/uL (ref 0.7–4.0)
MCH: 28.4 pg (ref 26.0–34.0)
MCHC: 32 g/dL (ref 30.0–36.0)
MCV: 88.7 fL (ref 80.0–100.0)
Monocytes Absolute: 0.8 10*3/uL (ref 0.1–1.0)
Monocytes Relative: 7 %
Neutro Abs: 9.5 10*3/uL — ABNORMAL HIGH (ref 1.7–7.7)
Neutrophils Relative %: 83 %
Platelets: 130 10*3/uL — ABNORMAL LOW (ref 150–400)
RBC: 4.23 MIL/uL (ref 3.87–5.11)
RDW: 14.1 % (ref 11.5–15.5)
WBC: 11.6 10*3/uL — ABNORMAL HIGH (ref 4.0–10.5)
nRBC: 0 % (ref 0.0–0.2)

## 2021-03-11 LAB — VITAMIN B12: Vitamin B-12: 228 pg/mL (ref 180–914)

## 2021-03-11 LAB — IRON AND TIBC
Iron: 18 ug/dL — ABNORMAL LOW (ref 28–170)
Saturation Ratios: 7 % — ABNORMAL LOW (ref 10.4–31.8)
TIBC: 259 ug/dL (ref 250–450)
UIBC: 241 ug/dL

## 2021-03-11 LAB — URINE CULTURE: Culture: >=100000 — AB

## 2021-03-11 LAB — GLUCOSE, CAPILLARY: Glucose-Capillary: 126 mg/dL — ABNORMAL HIGH (ref 70–99)

## 2021-03-11 LAB — HEMOGLOBIN A1C
Hgb A1c MFr Bld: 6.1 % — ABNORMAL HIGH (ref 4.8–5.6)
Mean Plasma Glucose: 128 mg/dL

## 2021-03-11 LAB — T4, FREE: Free T4: 1.03 ng/dL (ref 0.61–1.12)

## 2021-03-11 LAB — PHOSPHORUS: Phosphorus: 2 mg/dL — ABNORMAL LOW (ref 2.5–4.6)

## 2021-03-11 LAB — RETICULOCYTES
Immature Retic Fract: 10.1 % (ref 2.3–15.9)
RBC.: 4.13 MIL/uL (ref 3.87–5.11)
Retic Count, Absolute: 52.5 10*3/uL (ref 19.0–186.0)
Retic Ct Pct: 1.3 % (ref 0.4–3.1)

## 2021-03-11 LAB — FOLATE: Folate: 8.9 ng/mL (ref >5.9)

## 2021-03-11 LAB — MAGNESIUM: Magnesium: 1.8 mg/dL (ref 1.7–2.4)

## 2021-03-11 MED ORDER — POLYSACCHARIDE IRON COMPLEX 150 MG PO CAPS
150.0000 mg | ORAL_CAPSULE | Freq: Every day | ORAL | Status: DC
  Administered 2021-03-11 – 2021-03-14 (×4): 150 mg via ORAL
  Filled 2021-03-11 (×4): qty 1

## 2021-03-11 MED ORDER — SODIUM CHLORIDE 0.9 % IV SOLN
1.0000 g | INTRAVENOUS | Status: DC
  Administered 2021-03-11 – 2021-03-13 (×3): 1000 mg via INTRAVENOUS
  Filled 2021-03-11 (×5): qty 1

## 2021-03-11 MED ORDER — K PHOS MONO-SOD PHOS DI & MONO 155-852-130 MG PO TABS
500.0000 mg | ORAL_TABLET | Freq: Two times a day (BID) | ORAL | Status: AC
  Administered 2021-03-11 (×2): 500 mg via ORAL
  Filled 2021-03-11 (×2): qty 2

## 2021-03-11 MED ORDER — LIDOCAINE-EPINEPHRINE 1 %-1:100000 IJ SOLN
INTRAMUSCULAR | Status: AC | PRN
  Administered 2021-03-11: 6 mL

## 2021-03-11 MED ORDER — HEPARIN SOD (PORK) LOCK FLUSH 100 UNIT/ML IV SOLN
INTRAVENOUS | Status: AC
  Administered 2021-03-11: 400 [IU]
  Filled 2021-03-11: qty 5

## 2021-03-11 MED ORDER — LIDOCAINE-EPINEPHRINE 1 %-1:100000 IJ SOLN
INTRAMUSCULAR | Status: AC
  Administered 2021-03-11: 6 mL
  Filled 2021-03-11: qty 1

## 2021-03-11 MED ORDER — DIPHENHYDRAMINE HCL 25 MG PO CAPS
25.0000 mg | ORAL_CAPSULE | Freq: Four times a day (QID) | ORAL | Status: DC | PRN
  Administered 2021-03-11: 25 mg via ORAL
  Filled 2021-03-11: qty 1

## 2021-03-11 MED ORDER — SODIUM CHLORIDE 0.9 % IV SOLN
1.0000 g | Freq: Three times a day (TID) | INTRAVENOUS | Status: DC
  Administered 2021-03-11: 1 g via INTRAVENOUS
  Filled 2021-03-11: qty 1

## 2021-03-11 MED ORDER — MAGNESIUM SULFATE 2 GM/50ML IV SOLN
2.0000 g | Freq: Once | INTRAVENOUS | Status: AC
  Administered 2021-03-11: 2 g via INTRAVENOUS
  Filled 2021-03-11: qty 50

## 2021-03-11 NOTE — Progress Notes (Signed)
PHARMACY CONSULT NOTE FOR:  OUTPATIENT  PARENTERAL ANTIBIOTIC THERAPY (OPAT)  Indication: ESBL pyelonephritis Regimen: Ertapenem 1 g Q24H End date: 03/24/2021  IV antibiotic discharge orders are pended. To discharging provider:  please sign these orders via discharge navigator,  Select New Orders & click on the button choice - Manage This Unsigned Work.     Thank you for allowing pharmacy to be a part of this patient's care.  Faustino Congress, PharmD Candidate 03/11/2021, 3:25 PM

## 2021-03-11 NOTE — Progress Notes (Signed)
MEDICATION-RELATED CONSULT NOTE   IR Procedure Consult - Anticoagulant/Antiplatelet PTA/Inpatient Med List Review by Pharmacist    Procedure:  right IJ approach 23 cm single lumen PICC placement    Completed: 03/11/21 at 1630  Post-Procedural bleeding risk per IR MD assessment:  standard  Antithrombotic medications on inpatient or PTA profile prior to procedure:    - heparin 5000 units SQ q8h   Recommended restart time per IR Post-Procedure Guidelines:   - can resume 6 hours after procedure or at next standard dose interval   Plan:     - resume heparin 5000 units SQ q8h with next dose due at [place] - pharmacy will sign off. Re-consult [place] if need further assistance  Dia Sitter, PharmD, BCPS 03/11/2021 4:46 PM

## 2021-03-11 NOTE — TOC Progression Note (Signed)
Transition of Care [facility]) - Progression Note    Patient Details  Name: Katie Lyons MRN: [id number] Date of Birth: Jan 08, 1986  [facility] CM/SW Contact  Ross Ludwig, [place] Phone Number: 03/11/2021, 5:00 PM  Clinical Narrative:     CSW was informed that patient will need home IV antibiotics.  CSW contacted Pam from [facility], they can not take patient's insurance it will have to be through [facility].  Per Jeannene Patella she will contact CSW with contact information for [facility].  Per [facility] can not do anything for patient till early next week.  CSW to continue to follow patient's progress.    Expected Discharge Plan: Home/Self Care Barriers to Discharge: Continued Medical Work up  Expected Discharge Plan and Services Expected Discharge Plan: Home/Self Care       Living arrangements for the past 2 months: Single Family Home                                       Social Determinants of Health (SDOH) Interventions    Readmission Risk Interventions No flowsheet data found.

## 2021-03-11 NOTE — Progress Notes (Signed)
PROGRESS NOTE    Katie Lyons  [id number] DOB: 11-Aug-1986 DOA: 03/09/2021 PCP: Fleet Contras, MD   Brief Narrative:  Verne Cove is a 35 y.o. female with medical history significant for but not limited to history of depression and anxiety, chronic ischemic heart disease, history of end-stage renal disease on dialysis in 2008 status post renal transplantation in September 2019 currently on Prograf and mycophenolate, hypertension, iron deficiency anemia, history of TBI, history of secondary hyperparathyroidism as well as other comorbidities who presented with a chief complaint of burning in the urine as well and whole body pain.  Patient was seen and examined at the interpreter Vicente Males #[id number] and the patient states that she developed fever, chills, generalized body aches and burning with urination that started this yesterday into this morning.  She took Tylenol without relief of her symptoms and her last dose of Tylenol was 8 AM this morning.  She was recently admitted to [facility] for pyelonephritis of the renal transplant and presented with concern for fever body aches as well as dysuria.  She describes body aches and fever that started last night into this morning and describes the pain 8 out of 10 in all over as well as a significant headache with photophobia.  She states that the pain with urination is improved with Tylenol and her appetite is okay.  She states that she vomited this morning but has not had any diarrhea or cough.  Complains of dry mouth and dry throat as well as sore throat.  She had no sick contacts noted.  TRH was asked admit this patient for sepsis secondary to acute pyelonephritis   ED Course: In the ED she is given 2 L of lactated ringer boluses as well as IV cefepime and given IV fentanyl and IV antibiotics with cefepime.  She was also given acetaminophen 650 mg.  COVID testing was negative.  **Interim History  Sepsis Physiology is  improving and she is feeling better. WBC is improving and Urine Cx growing >100,000 CFU of E Coli and is now ESBL E Coli only sensitive to Imipenem, Nitrofurantoin, and PIP/TAZO.  We will change the patient to IV meropenem and given her ESBL E. coli UTI we will have to place a IJ PICC given that no arm PICC given her history of ESRD.  ID has been consulted for further evaluation recommendations.  Assessment & Plan:   Active Problems:   Pyelonephritis  Sepsis secondary to E Coli pyelonephritis in the setting of her transplanted kidney -Unclear as she recovered from her infection in June -Met sepsis physiology on admission with tachycardia, tachypnea as well as a fever and leukocytosis associated with urinary tract symptoms; Has an E Coli UTI  -Urinalysis done and showed a hazy appearance with moderate hemoglobin, moderate leukocytes, negative nitrates, few bacteria, 6-10 RBCs per high-power field, greater than 50 WBCs with urine culture showing >100,000 ESBL E Coli with Sensitivities as above. Blood culture x2 show NGTD <24 Hours   -CT Renal Stone Study done and showed "Although stranding and fluid about the patient's right lower quadrant renal transplant are improved compared to the prior CT, findings could be due to pyelonephritis. Negative for hydronephrosis or stone. Aortic Atherosclerosis." -WBC went from 18.6 -> 15.0 -> 11.6 -Given 3 Liters of Boluses and started on mIVF with NS at 100 mL/hr and reduced to 75 mL/hr and now will stop -Continue with IV antibiotics and will Escalate to IV Meropenem -Discussed with urology and they feel that there  is no obstruction so there is nothing for them to do.  Also discussed with nephrology who feels there is no indication to stop patient's antirejection drugs -Acetaminophen and Ibuprofen for Fever -C/w Supportive Care -ID Consulted for further Evaluation given ESBL    Hx of ESRD now with Renal Transplant Metabolic Acidosis -C/w Renal Transplant Meds  with Tacrolimus 7 mg po Daily and 6 mg po qHS and Mycophenolate 720 mg po BID and with p.o. Prednisone 5 mg; may need stress dosing -Had a small Acidosis C/w Sodium Bicarbonate 1950 mg po TID -IVF as above and will now stop  Hypomagnesemia -Patient's Mag Level was 1.2 and improved to 1.8 -Replete with IV Mag Sulfate 2 grams -Continue to Monitor and Replete as Necessary -Repeat Mag Level in the Am  Hypophosphatemia -Patient's Phos Level is 2.0 -Replete with po K Phos 500 mg BID x2 -Continue to Monitor and Replete as Necessary -Repeat Phos Level in the AM    Abnormal LFTs -In the setting of sepsis and infection -AST was 83 and ALT was 106 on Admission; Now AST is 27 and ALT is 52 -Continue to Monitor and Trend  -Repeat CMP in the AM    Headache, improved  -Had Photophobia -No Nucal Rigidity -Continue to Monitor for Meningitis -Head CT done and showed "No acute intracranial abnormality." -Will provide Fiorcet   Normocytic Anemia -Patient's Hgb/Hct went from 13.4/40.2 -> 11.7/36.1 -> 12.0/37.5 -Checked Anemia Panel and showed an iron level of 18, U IBC of 241, TIBC of 259, saturation of 7%, ferritin level 186, folate level 8.9, and vitamin B12 level of 228 -Likely Dilutional Drop -Started Iron Supplementation with Iron Polysaccharides 150 mg po Daily -Continue to Monitor for S/Sx of Bleeding; No overt Bleeding noted -Repeat CBC in the AM   Thrombocytopenia -Patient's Platelet Count went from 163 -> 130 x2  -Continue to Monitor for S/Sx of Bleeding; No overt bleeding noted -Repeat CBC in the AM   Low TSH -TSH was 0.175 -Checked Free T4 and was 1.03; T3   Prediabetes -Patient's HbA1c was 6.1 -Continue to Monitor CBG's per protocol; CBG's ranging from 114 - 126 -If Necessary will place on Sensitive Novolog SSI AC   DVT prophylaxis: Heparin 5,000 unit sq q8h Code Status: FULL CODE  Family Communication: No family present at bedside  Disposition Plan: Pending further  clinical improvement   Status is: Inpatient  Remains inpatient appropriate because:Unsafe d/c plan, IV treatments appropriate due to intensity of illness or inability to take PO, and Inpatient level of care appropriate due to severity of illness  Dispo: The patient is from: Home              Anticipated d/c is to: Home              Patient currently is not medically stable to d/c.   Difficult to place patient No  Consultants:  Discussed with Nephrology and Urology   Procedures: None  Antimicrobials:  Anti-infectives (From admission, onward)    Start     Dose/Rate Route Frequency Ordered Stop   03/11/21 1400  meropenem (MERREM) 1 g in sodium chloride 0.9 % 100 mL IVPB        1 g 200 mL/hr over 30 Minutes Intravenous Every 8 hours 03/11/21 0858     03/09/21 2200  ceFEPIme (MAXIPIME) 2 g in sodium chloride 0.9 % 100 mL IVPB  Status:  Discontinued        2 g 200 mL/hr over 30  Minutes Intravenous Every 8 hours 03/09/21 1746 03/11/21 0858   03/09/21 1200  ceFEPIme (MAXIPIME) 2 g in sodium chloride 0.9 % 100 mL IVPB        2 g 200 mL/hr over 30 Minutes Intravenous  Once 03/09/21 1153 03/09/21 1352   03/09/21 1145  cefTRIAXone (ROCEPHIN) 1 g in sodium chloride 0.9 % 100 mL IVPB  Status:  Discontinued        1 g 200 mL/hr over 30 Minutes Intravenous Every 24 hours 03/09/21 1144 03/09/21 1145        Subjective: Seen and examined at bedside with the translator with Nicki Reaper #[id number].  Patient states that she is doing okay today.  She denies any chest pain, lightheadedness or dizziness.  No abdominal pain and no burning or discomfort in her urine now.  A little frustrated given that this is now ESBL.  No other concerns or close at this time.  Objective: Vitals:   03/11/21 0500 03/11/21 0512 03/11/21 0618 03/11/21 0750  BP:  (!) 143/95  102/75  Pulse:  96  74  Resp:  18  18  Temp:  (!) 102.6 F (39.2 C) 99.7 F (37.6 C) 98.2 F (36.8 C)  TempSrc:  Oral    SpO2:  100%  100%  Weight:  62.3 kg     Height:        Intake/Output Summary (Last 24 hours) at 03/11/2021 1246 Last data filed at 03/11/2021 0300 Gross per 24 hour  Intake 1570.76 ml  Output --  Net 1570.76 ml    Filed Weights   03/09/21 1118 03/10/21 0500 03/11/21 0500  Weight: 59 kg 61 kg 62.3 kg   Examination: Physical Exam:  Constitutional: WN/WD overweight Hispanic Female in NAD and appears calm and comfortable Eyes: Lids and conjunctivae normal, sclerae anicteric  ENMT: External Ears, Nose appear normal. Grossly normal hearing.  Neck: Appears normal, supple, no cervical masses, normal ROM, no appreciable thyromegaly; no JVD Respiratory: Diminished to auscultation bilaterally, no wheezing, rales, rhonchi or crackles. Normal respiratory effort and patient is not tachypenic. No accessory muscle use. Unlabored breathing  Cardiovascular: RRR, no murmurs / rubs / gallops. S1 and S2 auscultated. No extremity edema.  Abdomen: Soft, non-tender, Distended 2/2 body habitus. Bowel sounds positive.  GU: Deferred. Musculoskeletal: No clubbing / cyanosis of digits/nails. No joint deformity upper and lower extremities.  Skin: No rashes, lesions, ulcers on a limited skin evaluation. No induration; Warm and dry.  Neurologic: CN 2-12 grossly intact with no focal deficits.Romberg sign and cerebellar reflexes not assessed.  Psychiatric: Normal judgment and insight. Alert and oriented x 3. Normal mood and appropriate affect.   Data Reviewed: I have personally reviewed following labs and imaging studies  CBC: Recent Labs  Lab 03/09/21 1143 03/10/21 0313 03/11/21 0413  WBC 18.6* 15.0* 11.6*  NEUTROABS 17.1* 12.1* 9.5*  HGB 13.4 11.7* 12.0  HCT 40.2 36.1 37.5  MCV 86.6 89.4 88.7  PLT 163 130* 130*    Basic Metabolic Panel: Recent Labs  Lab 03/09/21 1143 03/10/21 0313 03/11/21 0413  NA 135 140 135  K 3.5 4.3 3.9  CL 106 114* 107  CO2 19* 23 21*  GLUCOSE 141* 109* 130*  BUN $Re'18 14 11  'Bjg$ CREATININE 0.85 0.78  0.91  CALCIUM 9.8 8.2* 8.9  MG  --  1.2* 1.8  PHOS  --  2.4* 2.0*    GFR: Estimated Creatinine Clearance: 67.9 mL/min (by C-G formula based on SCr of 0.91 mg/dL). Liver Function Tests:  Recent Labs  Lab 03/09/21 1143 03/10/21 0313 03/11/21 0413  AST 83* 31 27  ALT 106* 60* 52*  ALKPHOS 121 82 109  BILITOT 1.7* 0.9 0.8  PROT 7.3 5.5* 6.6  ALBUMIN 4.1 3.0* 3.4*    No results for input(s): LIPASE, AMYLASE in the last 168 hours. No results for input(s): AMMONIA in the last 168 hours. Coagulation Profile: Recent Labs  Lab 03/09/21 1143 03/09/21 1846  INR 1.1 1.2    Cardiac Enzymes: No results for input(s): CKTOTAL, CKMB, CKMBINDEX, TROPONINI in the last 168 hours. BNP (last 3 results) No results for input(s): PROBNP in the last 8760 hours. HbA1C: Recent Labs    03/09/21 1846  HGBA1C 6.1*   CBG: Recent Labs  Lab 03/10/21 0817 03/11/21 0747  GLUCAP 114* 126*    Lipid Profile: No results for input(s): CHOL, HDL, LDLCALC, TRIG, CHOLHDL, LDLDIRECT in the last 72 hours. Thyroid Function Tests: Recent Labs    03/09/21 1846 03/11/21 0413  TSH 0.175*  --   FREET4  --  1.03    Anemia Panel: Recent Labs    03/11/21 0413  VITAMINB12 228  FOLATE 8.9  FERRITIN 186  TIBC 259  IRON 18*  RETICCTPCT 1.3   Sepsis Labs: Recent Labs  Lab 03/09/21 1143 03/09/21 1846 03/09/21 2012  LATICACIDVEN 1.2 1.0 1.2     Recent Results (from the past 240 hour(s))  Urine Culture     Status: Abnormal   Collection Time: 03/09/21 11:43 AM   Specimen: In/Out Cath Urine  Result Value Ref Range Status   Specimen Description   Final    IN/OUT CATH URINE Performed at [facility], [place] [address] [place], [place] [address]    Special Requests   Final    NONE Performed at [facility], [place] [address] [place], [place] [address]    Culture (A)  Final    >=100,000 COLONIES/mL ESCHERICHIA COLI Confirmed Extended Spectrum Beta-Lactamase  [name] (ESBL).  In bloodstream infections from ESBL organisms, carbapenems are preferred over piperacillin/tazobactam. They are shown to have a lower risk of mortality.    Report Status 03/11/2021 FINAL  Final   Organism ID, Bacteria ESCHERICHIA COLI (A)  Final      Susceptibility   Escherichia coli - MIC*    AMPICILLIN >=32 RESISTANT Resistant     CEFAZOLIN >=64 RESISTANT Resistant     CEFEPIME >=32 RESISTANT Resistant     CEFTRIAXONE >=64 RESISTANT Resistant     CIPROFLOXACIN >=4 RESISTANT Resistant     GENTAMICIN >=16 RESISTANT Resistant     IMIPENEM <=0.25 SENSITIVE Sensitive     NITROFURANTOIN <=16 SENSITIVE Sensitive     TRIMETH/SULFA >=320 RESISTANT Resistant     AMPICILLIN/SULBACTAM >=32 RESISTANT Resistant     PIP/TAZO 8 SENSITIVE Sensitive     * >=100,000 COLONIES/mL ESCHERICHIA COLI  Resp Panel by RT-PCR (Flu A&B, Covid) Nasopharyngeal Swab     Status: None   Collection Time: 03/09/21 12:09 PM   Specimen: Nasopharyngeal Swab; Nasopharyngeal(NP) swabs in vial transport medium  Result Value Ref Range Status   SARS Coronavirus 2 by RT PCR NEGATIVE NEGATIVE Final    Comment: (NOTE) SARS-CoV-2 target nucleic acids are NOT DETECTED.  The SARS-CoV-2 RNA is generally detectable in upper respiratory specimens during the acute phase of infection. The lowest concentration of SARS-CoV-2 viral copies this assay can detect is 138 copies/mL. A negative result does not preclude SARS-Cov-2 infection and should not be used as the sole basis  for treatment or other patient management decisions. A negative result may occur with  improper specimen collection/handling, submission of specimen other than nasopharyngeal swab, presence of viral mutation(s) within the areas targeted by this assay, and inadequate number of viral copies(<138 copies/mL). A negative result must be combined with clinical observations, patient history, and epidemiological information. The expected result is  Negative.  Fact Sheet for Patients:  EntrepreneurPulse.com.au  Fact Sheet for Healthcare Providers:  IncredibleEmployment.be  This test is no t yet approved or cleared by the [place] FDA and  has been authorized for detection and/or diagnosis of SARS-CoV-2 by FDA under an Emergency Use Authorization (EUA). This EUA will remain  in effect (meaning this test can be used) for the duration of the COVID-19 declaration under Section 564(b)(1) of the Act, 21 U.S.C.section 360bbb-3(b)(1), unless the authorization is terminated  or revoked sooner.       Influenza A by PCR NEGATIVE NEGATIVE Final   Influenza B by PCR NEGATIVE NEGATIVE Final    Comment: (NOTE) The Xpert Xpress SARS-CoV-2/FLU/RSV plus assay is intended as an aid in the diagnosis of influenza from Nasopharyngeal swab specimens and should not be used as a sole basis for treatment. Nasal washings and aspirates are unacceptable for Xpert Xpress SARS-CoV-2/FLU/RSV testing.  Fact Sheet for Patients: EntrepreneurPulse.com.au  Fact Sheet for Healthcare Providers: IncredibleEmployment.be  This test is not yet approved or cleared by the [place] FDA and has been authorized for detection and/or diagnosis of SARS-CoV-2 by FDA under an Emergency Use Authorization (EUA). This EUA will remain in effect (meaning this test can be used) for the duration of the COVID-19 declaration under Section 564(b)(1) of the Act, 21 U.S.C. section 360bbb-3(b)(1), unless the authorization is terminated or revoked.  Performed at [facility], [place] [address] [place], [place] [address]   Blood Culture (routine x 2)     Status: None (Preliminary result)   Collection Time: 03/09/21 12:09 PM   Specimen: BLOOD  Result Value Ref Range Status   Specimen Description   Final    BLOOD BLOOD RIGHT FOREARM Performed at [place]  [address], [place], [place] [address]    Special Requests   Final    BOTTLES DRAWN AEROBIC AND ANAEROBIC Blood Culture results may not be optimal due to an inadequate volume of blood received in culture bottles Performed at [place] [address], [place], [place] [address]    Culture   Final    NO GROWTH < 24 HOURS Performed at [place] [address] [place], [place] [address]    Report Status PENDING  Incomplete  Blood Culture (routine x 2)     Status: None (Preliminary result)   Collection Time: 03/09/21 12:09 PM   Specimen: BLOOD RIGHT HAND  Result Value Ref Range Status   Specimen Description   Final    BLOOD RIGHT HAND Performed at [place] [address], [place], [place] [address]    Special Requests   Final    BOTTLES DRAWN AEROBIC AND ANAEROBIC Blood Culture adequate volume Performed at [place] [address], [place], [place] [address]    Culture   Final    NO GROWTH < 24 HOURS Performed at [place] [address], [place], [place] [address]    Report Status PENDING  Incomplete  Group A Strep by PCR     Status: None   Collection Time:  03/09/21  1:05 PM   Specimen: Throat; Sterile Swab  Result Value Ref Range Status   Group A Strep by PCR NOT DETECTED NOT DETECTED Final    Comment: Performed at [facility], [place] [address], [place], [place] [address]     RN Pressure Injury Documentation:     Estimated body mass index is 27.74 kg/m as calculated from the following:   Height as of this encounter: $RemoveBeforeD'4\' 11"'iXLumVtrEheBcL$  (1.499 m).   Weight as of this encounter: 62.3 kg.  Malnutrition Type:   Malnutrition Characteristics:   Nutrition Interventions:   Radiology Studies: CT HEAD WO CONTRAST  Result Date: 03/09/2021 CLINICAL DATA:  Headache, fever EXAM: CT HEAD WITHOUT CONTRAST TECHNIQUE: Contiguous axial images were obtained from the base of the skull through the  vertex without intravenous contrast. COMPARISON:  11/14/2012 FINDINGS: Brain: No acute intracranial abnormality. Specifically, no hemorrhage, hydrocephalus, mass lesion, acute infarction, or significant intracranial injury. Area of encephalomalacia within the anterior right frontal lobe in area of previously seen hemorrhage. Vascular: No hyperdense vessel or unexpected calcification. Skull: No acute calvarial abnormality. Sinuses/Orbits: No acute findings Other: None IMPRESSION: No acute intracranial abnormality. Electronically Signed   By: Rolm Baptise M.D.   On: 03/09/2021 16:57   CT Renal Stone Study  Result Date: 03/09/2021 CLINICAL DATA:  Fever, chills, body aches and burning with urination since yesterday. EXAM: CT ABDOMEN AND PELVIS WITHOUT CONTRAST TECHNIQUE: Multidetector CT imaging of the abdomen and pelvis was performed following the standard protocol without IV contrast. COMPARISON:  CT abdomen and pelvis 01/22/2021. FINDINGS: Lower chest: Lung bases demonstrate mild dependent atelectasis. Heart size is normal. No pleural or pericardial effusion. Hepatobiliary: No focal liver abnormality is seen. No gallstones, gallbladder wall thickening, or biliary dilatation. Pancreas: Unremarkable. No pancreatic ductal dilatation or surrounding inflammatory changes. Spleen: Normal in size without focal abnormality. Adrenals/Urinary Tract: Adrenal glands appear normal. Markedly atrophic native kidneys are unchanged. The patient has a right lower quadrant renal transplant. There is stranding and some fluid about the transplant kidney which are improved compared to the prior CT. No hydronephrosis or stone. There is no focal perinephric fluid collection. Stomach/Bowel: Stomach is within normal limits. Appendix appears normal. No evidence of bowel wall thickening, distention, or inflammatory changes. Vascular/Lymphatic: Aortic atherosclerosis. No enlarged abdominal or pelvic lymph nodes. Reproductive: Uterus and  bilateral adnexa are unremarkable. Other: Small volume of free pelvic fluid is compatible with physiologic change. Musculoskeletal: Negative. IMPRESSION: Although stranding and fluid about the patient's right lower quadrant renal transplant are improved compared to the prior CT, findings could be due to pyelonephritis. Negative for hydronephrosis or stone. Aortic Atherosclerosis (ICD10-I70.0). Electronically Signed   By: Inge Rise M.D.   On: 03/09/2021 13:31    Scheduled Meds:  Chlorhexidine Gluconate Cloth  6 each Topical Q0600   docusate sodium  100 mg Oral BID   heparin  5,000 Units Subcutaneous Q8H   iron polysaccharides  150 mg Oral Daily   mycophenolate  720 mg Oral BID   phosphorus  500 mg Oral BID   predniSONE  5 mg Oral QAC breakfast   sodium bicarbonate  1,950 mg Oral TID   tacrolimus  6 mg Oral QHS   tacrolimus  7 mg Oral Daily   Continuous Infusions:  0.9 % NaCl with KCl 20 mEq / L 75 mL/hr at 03/11/21 1029   meropenem (MERREM) IV      LOS: 2 days   Kerney Elbe, DO [facility] PAGER is  on AMION  If 7PM-7AM, please contact night-coverage [email or web address]

## 2021-03-11 NOTE — Consult Note (Signed)
[facility]       Reason for Consult:pyelonephritis    Referring Physician: Dr. Alfredia Ferguson  Active Problems:   Pyelonephritis    Chlorhexidine Gluconate Cloth  6 each Topical Q0600   docusate sodium  100 mg Oral BID   heparin  5,000 Units Subcutaneous Q8H   iron polysaccharides  150 mg Oral Daily   mycophenolate  720 mg Oral BID   phosphorus  500 mg Oral BID   predniSONE  5 mg Oral QAC breakfast   sodium bicarbonate  1,950 mg Oral TID   tacrolimus  6 mg Oral QHS   tacrolimus  7 mg Oral Daily    Recommendations:  Tunneled catheter Ertapenem to complete 14 days Follow up with me 8/10 2:45 - will arrange IR removal of tunneled catheter for 8/11 or later.   Assessment: She has pyelonephritis in a transplanted kidney  Antibiotics: Cefepime > meropenem   HPI: Katie Lyons is a 35 y.o. female with a history of ESRD on dialysis in 2008 with a renal transplant done at [facility] in 2019 who presented with fever, myalgias and pyuria.  She was febrile to 102.4 with leukocytosis of 15.0 on admission.  She has a history of pyelonephritis treated in June but no culture growth and treated with cipro at discharge.  CT now with stranding of the kidney and urine culture with ESBL E coli.  She feels better since admission.    Review of Systems:  Constitutional: positive for fevers and chills Gastrointestinal: negative for nausea and diarrhea All other systems reviewed and are negative    Past Medical History:  Diagnosis Date   Anxiety    Chronic ischemic heart disease    Depression    3-4 years ago per husband   End stage renal disease ([place])    Headache    Hypertension    Iron deficiency    Secondary hyperparathyroidism ([place])    Traumatic brain injury ([place])     Social History   Tobacco Use   Smoking status: Never   Smokeless tobacco: Never  Substance Use Topics   Alcohol use: No   Drug use: No    [place]: + renal disease    No Known Allergies  Physical Exam: Constitutional: in no apparent distress  Vitals:   03/11/21 0750 03/11/21 1342  BP: 102/75 135/89  Pulse: 74 86  Resp: 18 18  Temp: 98.2 F (36.8 C) 98.9 F (37.2 C)  SpO2: 100% 100%   EYES: anicteric ENMT: no thrush Cardiovascular: Cor RRR Respiratory: clear; Musculoskeletal: no pedal edema noted Skin: negatives: no rash Neuro: non-focal  Lab Results  Component Value Date   WBC 11.6 (H) 03/11/2021   HGB 12.0 03/11/2021   HCT 37.5 03/11/2021   MCV 88.7 03/11/2021   PLT 130 (L) 03/11/2021    Lab Results  Component Value Date   CREATININE 0.91 03/11/2021   BUN 11 03/11/2021   NA 135 03/11/2021   K 3.9 03/11/2021   CL 107 03/11/2021   CO2 21 (L) 03/11/2021    Lab Results  Component Value Date   ALT 52 (H) 03/11/2021   AST 27 03/11/2021   ALKPHOS 109 03/11/2021     Microbiology: Recent Results (from the past 240 hour(s))  Urine Culture     Status: Abnormal   Collection Time: 03/09/21 11:43 AM   Specimen: In/Out Cath Urine  Result Value Ref Range Status   Specimen Description   Final  IN/OUT CATH URINE Performed at [facility], [place] [address] [place], [place] [address]    Special Requests   Final    NONE Performed at [facility], [place] [address], [place], [place] [address]    Culture (A)  Final    >=100,000 COLONIES/mL ESCHERICHIA COLI Confirmed Extended Spectrum Beta-Lactamase [name] (ESBL).  In bloodstream infections from ESBL organisms, carbapenems are preferred over piperacillin/tazobactam. They are shown to have a lower risk of mortality.    Report Status 03/11/2021 FINAL  Final   Organism ID, Bacteria ESCHERICHIA COLI (A)  Final      Susceptibility   Escherichia coli - MIC*    AMPICILLIN >=32 RESISTANT Resistant     CEFAZOLIN >=64 RESISTANT Resistant     CEFEPIME >=32 RESISTANT Resistant     CEFTRIAXONE >=64 RESISTANT Resistant     CIPROFLOXACIN >=4 RESISTANT  Resistant     GENTAMICIN >=16 RESISTANT Resistant     IMIPENEM <=0.25 SENSITIVE Sensitive     NITROFURANTOIN <=16 SENSITIVE Sensitive     TRIMETH/SULFA >=320 RESISTANT Resistant     AMPICILLIN/SULBACTAM >=32 RESISTANT Resistant     PIP/TAZO 8 SENSITIVE Sensitive     * >=100,000 COLONIES/mL ESCHERICHIA COLI  Resp Panel by RT-PCR (Flu A&B, Covid) Nasopharyngeal Swab     Status: None   Collection Time: 03/09/21 12:09 PM   Specimen: Nasopharyngeal Swab; Nasopharyngeal(NP) swabs in vial transport medium  Result Value Ref Range Status   SARS Coronavirus 2 by RT PCR NEGATIVE NEGATIVE Final    Comment: (NOTE) SARS-CoV-2 target nucleic acids are NOT DETECTED.  The SARS-CoV-2 RNA is generally detectable in upper respiratory specimens during the acute phase of infection. The lowest concentration of SARS-CoV-2 viral copies this assay can detect is 138 copies/mL. A negative result does not preclude SARS-Cov-2 infection and should not be used as the sole basis for treatment or other patient management decisions. A negative result may occur with  improper specimen collection/handling, submission of specimen other than nasopharyngeal swab, presence of viral mutation(s) within the areas targeted by this assay, and inadequate number of viral copies(<138 copies/mL). A negative result must be combined with clinical observations, patient history, and epidemiological information. The expected result is Negative.  Fact Sheet for Patients:  EntrepreneurPulse.com.au  Fact Sheet for Healthcare Providers:  IncredibleEmployment.be  This test is no t yet approved or cleared by the [place] FDA and  has been authorized for detection and/or diagnosis of SARS-CoV-2 by FDA under an Emergency Use Authorization (EUA). This EUA will remain  in effect (meaning this test can be used) for the duration of the COVID-19 declaration under Section 564(b)(1) of the Act,  21 U.S.C.section 360bbb-3(b)(1), unless the authorization is terminated  or revoked sooner.       Influenza A by PCR NEGATIVE NEGATIVE Final   Influenza B by PCR NEGATIVE NEGATIVE Final    Comment: (NOTE) The Xpert Xpress SARS-CoV-2/FLU/RSV plus assay is intended as an aid in the diagnosis of influenza from Nasopharyngeal swab specimens and should not be used as a sole basis for treatment. Nasal washings and aspirates are unacceptable for Xpert Xpress SARS-CoV-2/FLU/RSV testing.  Fact Sheet for Patients: EntrepreneurPulse.com.au  Fact Sheet for Healthcare Providers: IncredibleEmployment.be  This test is not yet approved or cleared by the [place] FDA and has been authorized for detection and/or diagnosis of SARS-CoV-2 by FDA under an Emergency Use Authorization (EUA). This EUA will remain in effect (meaning this test can be used) for the duration  of the COVID-19 declaration under Section 564(b)(1) of the Act, 21 U.S.C. section 360bbb-3(b)(1), unless the authorization is terminated or revoked.  Performed at [facility], [place] [address], [place], [place] [address]   Blood Culture (routine x 2)     Status: None (Preliminary result)   Collection Time: 03/09/21 12:09 PM   Specimen: BLOOD  Result Value Ref Range Status   Specimen Description   Final    BLOOD BLOOD RIGHT FOREARM Performed at [facility] [address] [place], [place] [address]    Special Requests   Final    BOTTLES DRAWN AEROBIC AND ANAEROBIC Blood Culture results may not be optimal due to an inadequate volume of blood received in culture bottles Performed at [facility] [address], [place], [place] [address]    Culture   Final    NO GROWTH < 24 HOURS Performed at [facility] [address] [place], [place] [address]    Report Status PENDING  Incomplete  Blood Culture (routine x 2)     Status:  None (Preliminary result)   Collection Time: 03/09/21 12:09 PM   Specimen: BLOOD RIGHT HAND  Result Value Ref Range Status   Specimen Description   Final    BLOOD RIGHT HAND Performed at [facility] [address], [place], [place] [address]    Special Requests   Final    BOTTLES DRAWN AEROBIC AND ANAEROBIC Blood Culture adequate volume Performed at [facility] [address] [place], [place] [address]    Culture   Final    NO GROWTH < 24 HOURS Performed at [facility] [address], [place], [place] [address]    Report Status PENDING  Incomplete  Group A Strep by PCR     Status: None   Collection Time: 03/09/21  1:05 PM   Specimen: Throat; Sterile Swab  Result Value Ref Range Status   Group A Strep by PCR NOT DETECTED NOT DETECTED Final    Comment: Performed at [facility], [place] [address], [place],  [address]    Andri Prestia W Kiora Hallberg, MD [facility] [email or web address] 03/11/2021, 3:55 PM

## 2021-03-11 NOTE — Progress Notes (Signed)
Pharmacy Antibiotic Note  Katie Lyons is a 35 y.o. female admitted on 03/09/2021 with sepsis and UTI.  Pharmacy was consulted initially for Cefepime dosing, but UCx resulted with an ESBL E coli, abx to Meropenem.  Hx renal transplant Sept 2019, renal fx wnl  Plan: Discontinue Cefepime Meropenem 1gm q8  Height: '4\' 11"'$  (149.9 cm) Weight: 62.3 kg (137 lb 5.6 oz) IBW/kg (Calculated) : 43.2  Temp (24hrs), Avg:99.3 F (37.4 C), Min:97.7 F (36.5 C), Max:102.6 F (39.2 C)  Recent Labs  Lab 03/09/21 1143 03/09/21 1846 03/09/21 2012 03/10/21 0313 03/11/21 0413  WBC 18.6*  --   --  15.0* 11.6*  CREATININE 0.85  --   --  0.78 0.91  LATICACIDVEN 1.2 1.0 1.2  --   --     Estimated Creatinine Clearance: 67.9 mL/min (by C-G formula based on SCr of 0.91 mg/dL).    No Known Allergies  Antimicrobials this admission: 7/27 cefepime >> 7/29 7/29 Meropenem >>  Dose adjustments this admission:  Microbiology results: 7/27 Group A Strep: not detected 7/27 UCx: resulted 7/29 with ESBL E coli 7/27 BCx2: ngtd  Thank you for allowing pharmacy to be a part of this patient's care.  Minda Ditto PharmD 03/11/2021 9:07 AM

## 2021-03-11 NOTE — Procedures (Signed)
Pre procedural Diagnosis: Poor venous access Post Procedural Diagnosis: Same  Successful placement of right IJ approach 23 cm single lumen PICC line with tip at the superior caval-atrial junction.    EBL: None  No immediate post procedural complication.  The CVC is ready for immediate use.  Ronny Bacon, MD Pager #: [phone]

## 2021-03-12 DIAGNOSIS — R945 Abnormal results of liver function studies: Secondary | ICD-10-CM

## 2021-03-12 LAB — COMPREHENSIVE METABOLIC PANEL
ALT: 72 U/L — ABNORMAL HIGH (ref 0–44)
AST: 43 U/L — ABNORMAL HIGH (ref 15–41)
Albumin: 3.6 g/dL (ref 3.5–5.0)
Alkaline Phosphatase: 139 U/L — ABNORMAL HIGH (ref 38–126)
Anion gap: 10 (ref 5–15)
BUN: 10 mg/dL (ref 6–20)
CO2: 21 mmol/L — ABNORMAL LOW (ref 22–32)
Calcium: 9.7 mg/dL (ref 8.9–10.3)
Chloride: 107 mmol/L (ref 98–111)
Creatinine, Ser: 0.72 mg/dL (ref 0.44–1.00)
GFR, Estimated: >60 mL/min (ref >60)
Glucose, Bld: 155 mg/dL — ABNORMAL HIGH (ref 70–99)
Potassium: 3.9 mmol/L (ref 3.5–5.1)
Sodium: 138 mmol/L (ref 135–145)
Total Bilirubin: 0.4 mg/dL (ref 0.3–1.2)
Total Protein: 6.9 g/dL (ref 6.5–8.1)

## 2021-03-12 LAB — CBC WITH DIFFERENTIAL/PLATELET
Abs Immature Granulocytes: 0.02 10*3/uL (ref 0.00–0.07)
Basophils Absolute: 0 10*3/uL (ref 0.0–0.1)
Basophils Relative: 0 %
Eosinophils Absolute: 0 10*3/uL (ref 0.0–0.5)
Eosinophils Relative: 0 %
HCT: 37.1 % (ref 36.0–46.0)
Hemoglobin: 12.2 g/dL (ref 12.0–15.0)
Immature Granulocytes: 0 %
Lymphocytes Relative: 16 %
Lymphs Abs: 1 10*3/uL (ref 0.7–4.0)
MCH: 28.7 pg (ref 26.0–34.0)
MCHC: 32.9 g/dL (ref 30.0–36.0)
MCV: 87.3 fL (ref 80.0–100.0)
Monocytes Absolute: 0.6 10*3/uL (ref 0.1–1.0)
Monocytes Relative: 10 %
Neutro Abs: 4.8 10*3/uL (ref 1.7–7.7)
Neutrophils Relative %: 74 %
Platelets: 151 10*3/uL (ref 150–400)
RBC: 4.25 MIL/uL (ref 3.87–5.11)
RDW: 14 % (ref 11.5–15.5)
WBC: 6.5 10*3/uL (ref 4.0–10.5)
nRBC: 0 % (ref 0.0–0.2)

## 2021-03-12 LAB — MAGNESIUM: Magnesium: 1.4 mg/dL — ABNORMAL LOW (ref 1.7–2.4)

## 2021-03-12 LAB — PHOSPHORUS: Phosphorus: 2.1 mg/dL — ABNORMAL LOW (ref 2.5–4.6)

## 2021-03-12 LAB — T3: T3, Total: 85 ng/dL (ref 71–180)

## 2021-03-12 LAB — GLUCOSE, CAPILLARY: Glucose-Capillary: 120 mg/dL — ABNORMAL HIGH (ref 70–99)

## 2021-03-12 MED ORDER — K PHOS MONO-SOD PHOS DI & MONO 155-852-130 MG PO TABS
500.0000 mg | ORAL_TABLET | Freq: Two times a day (BID) | ORAL | Status: AC
  Administered 2021-03-12 (×2): 500 mg via ORAL
  Filled 2021-03-12 (×2): qty 2

## 2021-03-12 MED ORDER — MAGNESIUM SULFATE 50 % IJ SOLN
3.0000 g | Freq: Once | INTRAVENOUS | Status: AC
  Administered 2021-03-12: 3 g via INTRAVENOUS
  Filled 2021-03-12 (×2): qty 6

## 2021-03-12 NOTE — Progress Notes (Signed)
PROGRESS NOTE    Katie Lyons  [id number] DOB: September 27, 1985 DOA: 03/09/2021 PCP: Fleet Contras, MD   Brief Narrative:  Katie Lyons is a 35 y.o. female with medical history significant for but not limited to history of depression and anxiety, chronic ischemic heart disease, history of end-stage renal disease on dialysis in 2008 status post renal transplantation in September 2019 currently on Prograf and mycophenolate, hypertension, iron deficiency anemia, history of TBI, history of secondary hyperparathyroidism as well as other comorbidities who presented with a chief complaint of burning in the urine as well and whole body pain.  Patient was seen and examined at the interpreter Vicente Males #[id number] and the patient states that she developed fever, chills, generalized body aches and burning with urination that started this yesterday into this morning.  She took Tylenol without relief of her symptoms and her last dose of Tylenol was 8 AM this morning.  She was recently admitted to [facility] for pyelonephritis of the renal transplant and presented with concern for fever body aches as well as dysuria.  She describes body aches and fever that started last night into this morning and describes the pain 8 out of 10 in all over as well as a significant headache with photophobia.  She states that the pain with urination is improved with Tylenol and her appetite is okay.  She states that she vomited this morning but has not had any diarrhea or cough.  Complains of dry mouth and dry throat as well as sore throat.  She had no sick contacts noted.  TRH was asked admit this patient for sepsis secondary to acute pyelonephritis   ED Course: In the ED she is given 2 L of lactated ringer boluses as well as IV cefepime and given IV fentanyl and IV antibiotics with cefepime.  She was also given acetaminophen 650 mg.  COVID testing was negative.  **Interim History  Sepsis Physiology is  improving and she is feeling better. WBC is improving and Urine Cx growing >100,000 CFU of E Coli and is now ESBL E Coli only sensitive to Imipenem, Nitrofurantoin, and PIP/TAZO.  She was changed to IV meropenem and given her ESBL E. coli UTI we will have to place a IJ PICC given that no arm PICC given her history of ESRD.  ID has been consulted for further evaluation recommendations and they are recommending ertapenem to complete 14 days and following up on 03/23/2021 at 2:45 PM was ID and arrangement of removal of IR catheter tunnel for 8/11 or later  Assessment & Plan:   Active Problems:   Pyelonephritis  Sepsis secondary to E Coli pyelonephritis in the setting of her transplanted kidney -Unclear as she recovered from her infection in June -Met sepsis physiology on admission with tachycardia, tachypnea as well as a fever and leukocytosis associated with urinary tract symptoms; Has an E Coli UTI  -Urinalysis done and showed a hazy appearance with moderate hemoglobin, moderate leukocytes, negative nitrates, few bacteria, 6-10 RBCs per high-power field, greater than 50 WBCs with urine culture showing >100,000 ESBL E Coli with Sensitivities as above. Blood culture x2 show NGTD <24 Hours   -CT Renal Stone Study done and showed "Although stranding and fluid about the patient's right lower quadrant renal transplant are improved compared to the prior CT, findings could be due to pyelonephritis. Negative for hydronephrosis or stone. Aortic Atherosclerosis." -WBC went from 18.6 -> 15.0 -> 11.6 -> 6.5 -Given 3 Liters of Boluses and started on mIVF with  NS at 100 mL/hr and reduced to 75 mL/hr and now will stop -Continue with IV antibiotics and will Escalate to IV Meropenem and now going to be changed to IV ertapenem every 24 for 14 days -Discussed with urology and they feel that there is no obstruction so there is nothing for them to do.  Also discussed with nephrology who feels there is no indication to stop  patient's antirejection drugs -Acetaminophen and Ibuprofen for Fever -C/w Supportive Care -ID Consulted for further Evaluation given ESBL and they are recommending ertapenem to complete 14 days and following up on 03/23/2021 at 2:45 PM was ID and arrangement of removal of IR catheter tunnel for 8/11 or later   Hx of ESRD now with Renal Transplant Metabolic Acidosis -C/w Renal Transplant Meds with Tacrolimus 7 mg po Daily and 6 mg po qHS and Mycophenolate 720 mg po BID and with p.o. Prednisone 5 mg; may need stress dosing -Had a small Acidosis C/w Sodium Bicarbonate 1950 mg po TID -IVF as above and will now stop -Patient's BUN/creatinine is stable at 10/2.72 and she still has a small metabolic acidosis with a CO2 of 21, anion gap of 10, chloride level 107 50 monitor and trend and monitor renal function carefully repeat CMP in a.m.  Hypomagnesemia -Patient's Mag Level was 1.2 and improved to 1.8 yesterday but today is 1.4 -Replete with IV Mag Sulfate 3 grams -Continue to Monitor and Replete as Necessary -Repeat Mag Level in the Am  Hypophosphatemia -Patient's Phos Level is 2.1 -Replete with po K Phos 500 mg BID x2 again -Continue to Monitor and Replete as Necessary -Repeat Phos Level in the AM    Abnormal LFTs -In the setting of sepsis and infection -AST was 83 and ALT was 106 on Admission; Now AST is 27 and ALT is 52 yesterday but slightly worsened and AST is 43 and ALT is 72 -She is to worsen and not improve we will obtain a right upper quadrant ultrasound and acute hepatitis panel -Continue to Monitor and Trend  -Repeat CMP in the AM    Headache, improved  -Had Photophobia -No Nucal Rigidity -Continue to Monitor for Meningitis -Head CT done and showed "No acute intracranial abnormality." -Will provide Fiorcet   Normocytic Anemia -Patient's Hgb/Hct went from 13.4/40.2 -> 11.7/36.1 -> 12.0/37.5 and is now 12.2/37.1 -Checked Anemia Panel and showed an iron level of 18, U IBC of  241, TIBC of 259, saturation of 7%, ferritin level 186, folate level 8.9, and vitamin B12 level of 228 -Likely Dilutional Drop -Started Iron Supplementation with Iron Polysaccharides 150 mg po Daily -Continue to Monitor for S/Sx of Bleeding; No overt Bleeding noted -Repeat CBC in the AM   Thrombocytopenia, improved -Patient's Platelet Count went from 163 -> 130 x2 and is now 151 -Continue to Monitor for S/Sx of Bleeding; No overt bleeding noted -Repeat CBC in the AM   Low TSH -TSH was 0.175 -Checked Free T4 and was 1.03; T3 was normal at 85  Prediabetes -Patient's HbA1c was 6.1 -Continue to Monitor CBG's per protocol; CBG's ranging from 114 - 126 still and blood sugar this morning on CMP was 155 -If Necessary will place on Sensitive Novolog SSI AC   DVT prophylaxis: Heparin 5,000 unit sq q8h Code Status: FULL CODE  Family Communication: No family present at bedside  Disposition Plan: Pending further clinical improvement and anticipate discharging when IV antibiotics arranged and set up for home with OPAT  Status is: Inpatient  Remains inpatient appropriate  because:Unsafe d/c plan, IV treatments appropriate due to intensity of illness or inability to take PO, and Inpatient level of care appropriate due to severity of illness  Dispo: The patient is from: Home              Anticipated d/c is to: Home              Patient currently is not medically stable to d/c.   Difficult to place patient No  Consultants:  Discussed with Nephrology and Urology   Procedures: None  Antimicrobials:  Anti-infectives (From admission, onward)    Start     Dose/Rate Route Frequency Ordered Stop   03/11/21 2200  ertapenem (INVANZ) 1,000 mg in sodium chloride 0.9 % 100 mL IVPB        1 g 200 mL/hr over 30 Minutes Intravenous Every 24 hours 03/11/21 1521     03/11/21 1400  meropenem (MERREM) 1 g in sodium chloride 0.9 % 100 mL IVPB  Status:  Discontinued        1 g 200 mL/hr over 30 Minutes  Intravenous Every 8 hours 03/11/21 0858 03/11/21 1521   03/09/21 2200  ceFEPIme (MAXIPIME) 2 g in sodium chloride 0.9 % 100 mL IVPB  Status:  Discontinued        2 g 200 mL/hr over 30 Minutes Intravenous Every 8 hours 03/09/21 1746 03/11/21 0858   03/09/21 1200  ceFEPIme (MAXIPIME) 2 g in sodium chloride 0.9 % 100 mL IVPB        2 g 200 mL/hr over 30 Minutes Intravenous  Once 03/09/21 1153 03/09/21 1352   03/09/21 1145  cefTRIAXone (ROCEPHIN) 1 g in sodium chloride 0.9 % 100 mL IVPB  Status:  Discontinued        1 g 200 mL/hr over 30 Minutes Intravenous Every 24 hours 03/09/21 1144 03/09/21 1145        Subjective: Seen and examined at bedside with the translator wit Linward Headland #[id number] and she states that she is doing well and has no complaints.  No burning or discomfort in her urine.  No abdominal pain.  States that she is a little sore and has some pain where her IJ catheter was placed.  No other concerns or complaints at this time.  Objective: Vitals:   03/11/21 1342 03/11/21 1941 03/12/21 0405 03/12/21 0500  BP: 135/89 126/84 139/88   Pulse: 86 75 66   Resp: _0 Temp: 98.9 F (37.2 C) 98 F (36.7 C) 97.8 F (36.6 C)   TempSrc:   Oral   SpO2: 100% 100% 100%   Weight:    61.3 kg  Height:        Intake/Output Summary (Last 24 hours) at 03/12/2021 1337 Last data filed at 03/11/2021 1500 Gross per 24 hour  Intake 788.62 ml  Output --  Net 788.62 ml    Filed Weights   03/10/21 0500 03/11/21 0500 03/12/21 0500  Weight: 61 kg 62.3 kg 61.3 kg   Examination: Physical Exam:  Constitutional: WN/WD overweight Hispanic female in no acute distress appears calm and comfortable Eyes: Lids and conjunctivae normal, sclerae anicteric  ENMT: External Ears, Nose appear normal. Grossly normal hearing.  Neck: Appears normal, supple, no cervical masses, normal ROM, no appreciable thyromegaly has a right-sided IJ catheter Respiratory: Diminished to auscultation bilaterally with coarse  breath sounds, no wheezing, rales, rhonchi or crackles. Normal respiratory effort and patient is not tachypenic. No accessory muscle use.  Unlabored breathing Cardiovascular: RRR,  no murmurs / rubs / gallops. S1 and S2 auscultated. No extremity edema. Abdomen: Soft, non-tender, distended secondary body habitus. Bowel sounds positive.  GU: Deferred. Musculoskeletal: No clubbing / cyanosis of digits/nails. No joint deformity upper and lower extremities.  Skin: No rashes, lesions, ulcers on limited skin evaluation. No induration; Warm and dry.  Neurologic: CN 2-12 grossly intact with no focal deficits. Romberg sign and cerebellar reflexes not assessed.  Psychiatric: Normal judgment and insight. Alert and oriented x 3. Normal mood and appropriate affect.   Data Reviewed: I have personally reviewed following labs and imaging studies  CBC: Recent Labs  Lab 03/09/21 1143 03/10/21 0313 03/11/21 0413 03/12/21 0946  WBC 18.6* 15.0* 11.6* 6.5  NEUTROABS 17.1* 12.1* 9.5* 4.8  HGB 13.4 11.7* 12.0 12.2  HCT 40.2 36.1 37.5 37.1  MCV 86.6 89.4 88.7 87.3  PLT 163 130* 130* 624    Basic Metabolic Panel: Recent Labs  Lab 03/09/21 1143 03/10/21 0313 03/11/21 0413 03/12/21 0946  NA 135 140 135 138  K 3.5 4.3 3.9 3.9  CL 106 114* 107 107  CO2 19* 23 21* 21*  GLUCOSE 141* 109* 130* 155*  BUN _0 CREATININE 0.85 0.78 0.91 0.72  CALCIUM 9.8 8.2* 8.9 9.7  MG  --  1.2* 1.8 1.4*  PHOS  --  2.4* 2.0* 2.1*    GFR: Estimated Creatinine Clearance: 76.6 mL/min (by C-G formula based on SCr of 0.72 mg/dL). Liver Function Tests: Recent Labs  Lab 03/09/21 1143 03/10/21 0313 03/11/21 0413 03/12/21 0946  AST 83* 31 27 43*  ALT 106* 60* 52* 72*  ALKPHOS 121 82 109 139*  BILITOT 1.7* 0.9 0.8 0.4  PROT 7.3 5.5* 6.6 6.9  ALBUMIN 4.1 3.0* 3.4* 3.6    No results for input(s): LIPASE, AMYLASE in the last 168 hours. No results for input(s): AMMONIA in the last 168 hours. Coagulation  Profile: Recent Labs  Lab 03/09/21 1143 03/09/21 1846  INR 1.1 1.2    Cardiac Enzymes: No results for input(s): CKTOTAL, CKMB, CKMBINDEX, TROPONINI in the last 168 hours. BNP (last 3 results) No results for input(s): PROBNP in the last 8760 hours. HbA1C: Recent Labs    03/09/21 1846  HGBA1C 6.1*    CBG: Recent Labs  Lab 03/10/21 0817 03/11/21 0747 03/12/21 0732  GLUCAP 114* 126* 120*    Lipid Profile: No results for input(s): CHOL, HDL, LDLCALC, TRIG, CHOLHDL, LDLDIRECT in the last 72 hours. Thyroid Function Tests: Recent Labs    03/09/21 1846 03/11/21 0413  TSH 0.175*  --   FREET4  --  1.03    Anemia Panel: Recent Labs    03/11/21 0413  VITAMINB12 228  FOLATE 8.9  FERRITIN 186  TIBC 259  IRON 18*  RETICCTPCT 1.3    Sepsis Labs: Recent Labs  Lab 03/09/21 1143 03/09/21 1846 03/09/21 2012  LATICACIDVEN 1.2 1.0 1.2     Recent Results (from the past 240 hour(s))  Urine Culture     Status: Abnormal   Collection Time: 03/09/21 11:43 AM   Specimen: In/Out Cath Urine  Result Value Ref Range Status   Specimen Description   Final    IN/OUT CATH URINE Performed at [facility], [place] [address] [place], [place] [address]    Special Requests   Final    NONE Performed at [facility], [place] [address], [place], [place] [address]    Culture (A)  Final    >=100,000 COLONIES/mL ESCHERICHIA  COLI Confirmed Extended Spectrum Beta-Lactamase [name] (ESBL).  In bloodstream infections from ESBL organisms, carbapenems are preferred over piperacillin/tazobactam. They are shown to have a lower risk of mortality.    Report Status 03/11/2021 FINAL  Final   Organism ID, Bacteria ESCHERICHIA COLI (A)  Final      Susceptibility   Escherichia coli - MIC*    AMPICILLIN >=32 RESISTANT Resistant     CEFAZOLIN >=64 RESISTANT Resistant     CEFEPIME >=32 RESISTANT Resistant     CEFTRIAXONE >=64 RESISTANT Resistant     CIPROFLOXACIN  >=4 RESISTANT Resistant     GENTAMICIN >=16 RESISTANT Resistant     IMIPENEM <=0.25 SENSITIVE Sensitive     NITROFURANTOIN <=16 SENSITIVE Sensitive     TRIMETH/SULFA >=320 RESISTANT Resistant     AMPICILLIN/SULBACTAM >=32 RESISTANT Resistant     PIP/TAZO 8 SENSITIVE Sensitive     * >=100,000 COLONIES/mL ESCHERICHIA COLI  Resp Panel by RT-PCR (Flu A&B, Covid) Nasopharyngeal Swab     Status: None   Collection Time: 03/09/21 12:09 PM   Specimen: Nasopharyngeal Swab; Nasopharyngeal(NP) swabs in vial transport medium  Result Value Ref Range Status   SARS Coronavirus 2 by RT PCR NEGATIVE NEGATIVE Final    Comment: (NOTE) SARS-CoV-2 target nucleic acids are NOT DETECTED.  The SARS-CoV-2 RNA is generally detectable in upper respiratory specimens during the acute phase of infection. The lowest concentration of SARS-CoV-2 viral copies this assay can detect is 138 copies/mL. A negative result does not preclude SARS-Cov-2 infection and should not be used as the sole basis for treatment or other patient management decisions. A negative result may occur with  improper specimen collection/handling, submission of specimen other than nasopharyngeal swab, presence of viral mutation(s) within the areas targeted by this assay, and inadequate number of viral copies(<138 copies/mL). A negative result must be combined with clinical observations, patient history, and epidemiological information. The expected result is Negative.  Fact Sheet for Patients:  EntrepreneurPulse.com.au  Fact Sheet for Healthcare Providers:  IncredibleEmployment.be  This test is no t yet approved or cleared by the [place] FDA and  has been authorized for detection and/or diagnosis of SARS-CoV-2 by FDA under an Emergency Use Authorization (EUA). This EUA will remain  in effect (meaning this test can be used) for the duration of the COVID-19 declaration under Section 564(b)(1) of the  Act, 21 U.S.C.section 360bbb-3(b)(1), unless the authorization is terminated  or revoked sooner.       Influenza A by PCR NEGATIVE NEGATIVE Final   Influenza B by PCR NEGATIVE NEGATIVE Final    Comment: (NOTE) The Xpert Xpress SARS-CoV-2/FLU/RSV plus assay is intended as an aid in the diagnosis of influenza from Nasopharyngeal swab specimens and should not be used as a sole basis for treatment. Nasal washings and aspirates are unacceptable for Xpert Xpress SARS-CoV-2/FLU/RSV testing.  Fact Sheet for Patients: EntrepreneurPulse.com.au  Fact Sheet for Healthcare Providers: IncredibleEmployment.be  This test is not yet approved or cleared by the [place] FDA and has been authorized for detection and/or diagnosis of SARS-CoV-2 by FDA under an Emergency Use Authorization (EUA). This EUA will remain in effect (meaning this test can be used) for the duration of the COVID-19 declaration under Section 564(b)(1) of the Act, 21 U.S.C. section 360bbb-3(b)(1), unless the authorization is terminated or revoked.  Performed at [facility], [place] [address] [place], [place] [address]   Blood Culture (routine x 2)     Status: None (Preliminary result)   Collection Time:  03/09/21 12:09 PM   Specimen: BLOOD  Result Value Ref Range Status   Specimen Description   Final    BLOOD BLOOD RIGHT FOREARM Performed at [place] [address] [place], [place] [address]    Special Requests   Final    BOTTLES DRAWN AEROBIC AND ANAEROBIC Blood Culture results may not be optimal due to an inadequate volume of blood received in culture bottles Performed at [place] [address], [place], [place] [address]    Culture   Final    NO GROWTH < 24 HOURS Performed at [place] [address] [place], [place] [address]    Report Status PENDING  Incomplete  Blood Culture (routine x 2)      Status: None (Preliminary result)   Collection Time: 03/09/21 12:09 PM   Specimen: BLOOD RIGHT HAND  Result Value Ref Range Status   Specimen Description   Final    BLOOD RIGHT HAND Performed at [place] [address] [place], [place] [address]    Special Requests   Final    BOTTLES DRAWN AEROBIC AND ANAEROBIC Blood Culture adequate volume Performed at [place] [address] [place], [place] [address]    Culture   Final    NO GROWTH < 24 HOURS Performed at [place] [address] [place], [place] [address]    Report Status PENDING  Incomplete  Group A Strep by PCR     Status: None   Collection Time: 03/09/21  1:05 PM   Specimen: Throat; Sterile Swab  Result Value Ref Range Status   Group A Strep by PCR NOT DETECTED NOT DETECTED Final    Comment: Performed at [facility], [place] [address] [place], [place] [address]     RN Pressure Injury Documentation:     Estimated body mass index is 27.3 kg/m as calculated from the following:   Height as of this encounter: _0  (1.499 m).   Weight as of this encounter: 61.3 kg.  Malnutrition Type:   Malnutrition Characteristics:   Nutrition Interventions:   Radiology Studies: IR Fluoro Guide CV Line Right  Result Date: 03/11/2021 INDICATION: 0 history of end-stage renal disease, post renal transplantation, now with urinary tract infection, in need of durable intravenous access for intravenous antibiotics. EXAM: ULTRASOUND AND FLUOROSCOPIC GUIDED PLACEMENT OF TUNNELED CENTRAL VENOUS CATHETER MEDICATIONS: None. The patient admitted to the hospital receiving intravenous. The antibiotics were administered within an appropriate prior to initiation the procedure. ANESTHESIA/SEDATION: None FLUOROSCOPY TIME:  18 seconds (2 mGy) COMPLICATIONS: None immediate. PROCEDURE: Informed written consent was obtained from the patient after a discussion of the risks, benefits,  and alternatives to treatment. Questions regarding the procedure were encouraged and answered. The right neck and chest were prepped with chlorhexidine in a sterile fashion, and a sterile drape was applied covering the operative field. Maximum barrier sterile technique with sterile gowns and gloves were used for the procedure. A timeout was performed prior to the initiation of the procedure. After the overlying soft tissues were anesthetized, a small venotomy incision was created and a micropuncture kit was utilized to access the internal jugular vein. Real-time ultrasound guidance was utilized for vascular access including the acquisition of a permanent ultrasound image documenting patency of the accessed vessel. The microwire was utilized to measure appropriate catheter length. The micropuncture sheath was exchanged for a peel-away sheath over a guidewire. A 5 [place] single  lumen tunneled central venous catheter measuring 23 cm was tunneled in a retrograde fashion from the anterior chest wall to the venotomy incision. The catheter was then placed through the peel-away sheath with tip ultimately positioned at the superior caval-atrial junction. Final catheter positioning was confirmed and documented with a spot radiographic image. The catheter aspirates and flushes normally. The catheter was flushed with appropriate volume heparin dwells. The catheter exit site was secured with a 0-Prolene retention suture. The venotomy incision was closed with an interrupted 4-0 Vicryl, Dermabond and Steri-strips. Dressings were applied. The patient tolerated the procedure well without immediate post procedural complication. FINDINGS: After catheter placement, the tip lies within the superior cavoatrial junction. The catheter aspirates and flushes normally and is ready for immediate use. IMPRESSION: Successful placement of 23 cm single lumen tunneled central venous catheter via the right internal jugular vein with tip terminating  at the superior caval atrial junction. The catheter is ready for immediate use. Electronically Signed   By: Sandi Mariscal M.D.   On: 03/11/2021 16:27   IR US Guide Vasc Access Right  Result Date: 03/11/2021 INDICATION: 0 history of end-stage renal disease, post renal transplantation, now with urinary tract infection, in need of durable intravenous access for intravenous antibiotics. EXAM: ULTRASOUND AND FLUOROSCOPIC GUIDED PLACEMENT OF TUNNELED CENTRAL VENOUS CATHETER MEDICATIONS: None. The patient admitted to the hospital receiving intravenous. The antibiotics were administered within an appropriate prior to initiation the procedure. ANESTHESIA/SEDATION: None FLUOROSCOPY TIME:  18 seconds (2 mGy) COMPLICATIONS: None immediate. PROCEDURE: Informed written consent was obtained from the patient after a discussion of the risks, benefits, and alternatives to treatment. Questions regarding the procedure were encouraged and answered. The right neck and chest were prepped with chlorhexidine in a sterile fashion, and a sterile drape was applied covering the operative field. Maximum barrier sterile technique with sterile gowns and gloves were used for the procedure. A timeout was performed prior to the initiation of the procedure. After the overlying soft tissues were anesthetized, a small venotomy incision was created and a micropuncture kit was utilized to access the internal jugular vein. Real-time ultrasound guidance was utilized for vascular access including the acquisition of a permanent ultrasound image documenting patency of the accessed vessel. The microwire was utilized to measure appropriate catheter length. The micropuncture sheath was exchanged for a peel-away sheath over a guidewire. A 5 [place] single lumen tunneled central venous catheter measuring 23 cm was tunneled in a retrograde fashion from the anterior chest wall to the venotomy incision. The catheter was then placed through the peel-away sheath with  tip ultimately positioned at the superior caval-atrial junction. Final catheter positioning was confirmed and documented with a spot radiographic image. The catheter aspirates and flushes normally. The catheter was flushed with appropriate volume heparin dwells. The catheter exit site was secured with a 0-Prolene retention suture. The venotomy incision was closed with an interrupted 4-0 Vicryl, Dermabond and Steri-strips. Dressings were applied. The patient tolerated the procedure well without immediate post procedural complication. FINDINGS: After catheter placement, the tip lies within the superior cavoatrial junction. The catheter aspirates and flushes normally and is ready for immediate use. IMPRESSION: Successful placement of 23 cm single lumen tunneled central venous catheter via the right internal jugular vein with tip terminating at the superior caval atrial junction. The catheter is ready for immediate use. Electronically Signed   By: Sandi Mariscal M.D.   On: 03/11/2021 16:27    Scheduled Meds:  Chlorhexidine Gluconate Cloth  6 each Topical  Q0600   docusate sodium  100 mg Oral BID   heparin  5,000 Units Subcutaneous Q8H   iron polysaccharides  150 mg Oral Daily   mycophenolate  720 mg Oral BID   predniSONE  5 mg Oral QAC breakfast   sodium bicarbonate  1,950 mg Oral TID   tacrolimus  6 mg Oral QHS   tacrolimus  7 mg Oral Daily   Continuous Infusions:  ertapenem 1,000 mg (03/11/21 2212)    LOS: 3 days   Kerney Elbe, DO [facility] PAGER is on AMION  If 7PM-7AM, please contact night-coverage [email or web address]

## 2021-03-13 LAB — CBC WITH DIFFERENTIAL/PLATELET
Abs Immature Granulocytes: 0.02 10*3/uL (ref 0.00–0.07)
Basophils Absolute: 0 10*3/uL (ref 0.0–0.1)
Basophils Relative: 0 %
Eosinophils Absolute: 0.1 10*3/uL (ref 0.0–0.5)
Eosinophils Relative: 1 %
HCT: 36.8 % (ref 36.0–46.0)
Hemoglobin: 11.9 g/dL — ABNORMAL LOW (ref 12.0–15.0)
Immature Granulocytes: 0 %
Lymphocytes Relative: 27 %
Lymphs Abs: 1.6 10*3/uL (ref 0.7–4.0)
MCH: 28.5 pg (ref 26.0–34.0)
MCHC: 32.3 g/dL (ref 30.0–36.0)
MCV: 88 fL (ref 80.0–100.0)
Monocytes Absolute: 0.8 10*3/uL (ref 0.1–1.0)
Monocytes Relative: 13 %
Neutro Abs: 3.5 10*3/uL (ref 1.7–7.7)
Neutrophils Relative %: 59 %
Platelets: 166 10*3/uL (ref 150–400)
RBC: 4.18 MIL/uL (ref 3.87–5.11)
RDW: 14.1 % (ref 11.5–15.5)
WBC: 6 10*3/uL (ref 4.0–10.5)
nRBC: 0 % (ref 0.0–0.2)

## 2021-03-13 LAB — COMPREHENSIVE METABOLIC PANEL
ALT: 60 U/L — ABNORMAL HIGH (ref 0–44)
AST: 26 U/L (ref 15–41)
Albumin: 3.4 g/dL — ABNORMAL LOW (ref 3.5–5.0)
Alkaline Phosphatase: 142 U/L — ABNORMAL HIGH (ref 38–126)
Anion gap: 9 (ref 5–15)
BUN: 9 mg/dL (ref 6–20)
CO2: 23 mmol/L (ref 22–32)
Calcium: 9.9 mg/dL (ref 8.9–10.3)
Chloride: 108 mmol/L (ref 98–111)
Creatinine, Ser: 0.63 mg/dL (ref 0.44–1.00)
GFR, Estimated: >60 mL/min (ref >60)
Glucose, Bld: 120 mg/dL — ABNORMAL HIGH (ref 70–99)
Potassium: 4.2 mmol/L (ref 3.5–5.1)
Sodium: 140 mmol/L (ref 135–145)
Total Bilirubin: 0.5 mg/dL (ref 0.3–1.2)
Total Protein: 6.7 g/dL (ref 6.5–8.1)

## 2021-03-13 LAB — GLUCOSE, CAPILLARY
Glucose-Capillary: 125 mg/dL — ABNORMAL HIGH (ref 70–99)
Glucose-Capillary: 152 mg/dL — ABNORMAL HIGH (ref 70–99)

## 2021-03-13 LAB — PHOSPHORUS: Phosphorus: 2.9 mg/dL (ref 2.5–4.6)

## 2021-03-13 LAB — MAGNESIUM: Magnesium: 1.9 mg/dL (ref 1.7–2.4)

## 2021-03-13 MED ORDER — SODIUM CHLORIDE 0.9% FLUSH: 10.0000 mL | INTRAVENOUS | Status: DC | PRN

## 2021-03-13 MED ORDER — SODIUM CHLORIDE 0.9% FLUSH
10.0000 mL | Freq: Two times a day (BID) | INTRAVENOUS | Status: DC
  Administered 2021-03-13 – 2021-03-14 (×2): 10 mL

## 2021-03-13 NOTE — Progress Notes (Addendum)
PROGRESS NOTE    Shilo Philipson  [id number] DOB: 02-Dec-1985 DOA: 03/09/2021 PCP: Fleet Contras, MD   Brief Narrative:  Nuriyah Hanline is a 35 y.o. female with medical history significant for but not limited to history of depression and anxiety, chronic ischemic heart disease, history of end-stage renal disease on dialysis in 2008 status post renal transplantation in September 2019 currently on Prograf and mycophenolate, hypertension, iron deficiency anemia, history of TBI, history of secondary hyperparathyroidism as well as other comorbidities who presented with a chief complaint of burning in the urine as well and whole body pain.  Patient was seen and examined at the interpreter Vicente Males #[id number] and the patient states that she developed fever, chills, generalized body aches and burning with urination that started this yesterday into this morning.  She took Tylenol without relief of her symptoms and her last dose of Tylenol was 8 AM this morning.  She was recently admitted to [facility] for pyelonephritis of the renal transplant and presented with concern for fever body aches as well as dysuria.  She describes body aches and fever that started last night into this morning and describes the pain 8 out of 10 in all over as well as a significant headache with photophobia.  She states that the pain with urination is improved with Tylenol and her appetite is okay.  She states that she vomited this morning but has not had any diarrhea or cough.  Complains of dry mouth and dry throat as well as sore throat.  She had no sick contacts noted.  TRH was asked admit this patient for sepsis secondary to acute pyelonephritis   ED Course: In the ED she is given 2 L of lactated ringer boluses as well as IV cefepime and given IV fentanyl and IV antibiotics with cefepime.  She was also given acetaminophen 650 mg.  COVID testing was negative.  **Interim History  Sepsis Physiology is  improving and she is feeling better. WBC is improving and Urine Cx growing >100,000 CFU of E Coli and is now ESBL E Coli only sensitive to Imipenem, Nitrofurantoin, and PIP/TAZO.  She was changed to IV meropenem and given her ESBL E. coli UTI we will have to place a IJ PICC given that no arm PICC given her history of ESRD.  ID has been consulted for further evaluation recommendations and they are recommending ertapenem to complete 14 days and following up on 03/23/2021 at 2:45 PM with ID and arrangement of removal of IR catheter tunnel for 8/11 or later; Awaiting Insurance Approval for Abx to D/C home.   Assessment & Plan:   Active Problems:   Pyelonephritis  Sepsis secondary to E Coli pyelonephritis in the setting of her transplanted kidney -Unclear as she recovered from her infection in June -Met sepsis physiology on admission with tachycardia, tachypnea as well as a fever and leukocytosis associated with urinary tract symptoms; Has an E Coli UTI  -Urinalysis done and showed a hazy appearance with moderate hemoglobin, moderate leukocytes, negative nitrates, few bacteria, 6-10 RBCs per high-power field, greater than 50 WBCs with urine culture showing >100,000 ESBL E Coli with Sensitivities as above. Blood culture x2 show NGTD <24 Hours   -CT Renal Stone Study done and showed "Although stranding and fluid about the patient's right lower quadrant renal transplant are improved compared to the prior CT, findings could be due to pyelonephritis. Negative for hydronephrosis or stone. Aortic Atherosclerosis." -WBC went from 18.6 -> 15.0 -> 11.6 -> 6.5 ->  6.0 -Given 3 Liters of Boluses and started on mIVF with NS at 100 mL/hr and reduced to 75 mL/hr and now will stop -Continue with IV antibiotics and will Escalate to IV Meropenem and now going to be changed to IV ertapenem every 24 for 14 days -Discussed with urology and they feel that there is no obstruction so there is nothing for them to do.  Also discussed  with nephrology who feels there is no indication to stop patient's antirejection drugs -Acetaminophen and Ibuprofen for Fever -C/w Supportive Care -ID Consulted for further Evaluation given ESBL and they are recommending ertapenem to complete 14 days and following up on 03/23/2021 at 2:45 PM was ID and arrangement of removal of IR catheter tunnel for 8/11 or later   Hx of ESRD now with Renal Transplant Metabolic Acidosis -C/w Renal Transplant Meds with Tacrolimus 7 mg po Daily and 6 mg po qHS and Mycophenolate 720 mg po BID and with p.o. Prednisone 5 mg; may need stress dosing -Had a small Acidosis C/w Sodium Bicarbonate 1950 mg po TID -IVF as above and will now stop -Patient's BUN/creatinine is stable at 8/1.82  and Metabolic Acidosis is improved with a CO2 of 23, anion gap of 9, chloride level 108 -Continue to monitor and trend and monitor renal function carefully repeat CMP in a.m.  Hypomagnesemia -Patient's Mag Level  is improved to 1.9 -Continue to Monitor and Replete as Necessary -Repeat Mag Level in the Am  Hypophosphatemia -Patient's Phos Level is 2.9 -Continue to Monitor and Replete as Necessary -Repeat Phos Level in the AM    Abnormal LFTs -In the setting of sepsis and infection -AST is improved and is 26 and ALT is 60 -IF LFT's continue to worsen and not improve we will obtain a right upper quadrant ultrasound and acute hepatitis panel -Continue to Monitor and Trend  -Repeat CMP in the AM    Headache, improved  -Had Photophobia -No Nucal Rigidity -Continue to Monitor for Meningitis -Head CT done and showed "No acute intracranial abnormality." -Will provide Fiorcet   Normocytic Anemia -Patient's Hgb/Hct went from 13.4/40.2 -> 11.7/36.1 -> 12.0/37.5 -> 12.2/37.1 and is now 11.9/36.8 -Checked Anemia Panel and showed an iron level of 18, U IBC of 241, TIBC of 259, saturation of 7%, ferritin level 186, folate level 8.9, and vitamin B12 level of 228 -Likely Dilutional  Drop -Started Iron Supplementation with Iron Polysaccharides 150 mg po Daily -Continue to Monitor for S/Sx of Bleeding; No overt Bleeding noted -Repeat CBC in the AM   Thrombocytopenia, improved -Patient's Platelet Count went from 163 -> 130 x2 and is now 151 -Continue to Monitor for S/Sx of Bleeding; No overt bleeding noted -Repeat CBC in the AM   Low TSH -TSH was 0.175 -Checked Free T4 and was 1.03; T3 was normal at 85  Prediabetes -Patient's HbA1c was 6.1 -Continue to Monitor CBG's per protocol; CBG's ranging from 114 - 126 still and blood sugar this morning on CMP was 120 -If Necessary will place on Sensitive Novolog SSI AC  DVT prophylaxis: Heparin 5,000 unit sq q8h Code Status: FULL CODE  Family Communication: Daughters at bedside  Disposition Plan: Pending further clinical improvement and anticipate discharging when IV antibiotics arranged and set up for home with OPAT  Status is: Inpatient  Remains inpatient appropriate because:Unsafe d/c plan, IV treatments appropriate due to intensity of illness or inability to take PO, and Inpatient level of care appropriate due to severity of illness  Dispo: The patient is  from: Home              Anticipated d/c is to: Home              Patient currently is not medically stable to d/c.   Difficult to place patient No  Consultants:  Discussed with Nephrology and Urology   Procedures: None  Antimicrobials:  Anti-infectives (From admission, onward)    Start     Dose/Rate Route Frequency Ordered Stop   03/11/21 2200  ertapenem (INVANZ) 1,000 mg in sodium chloride 0.9 % 100 mL IVPB        1 g 200 mL/hr over 30 Minutes Intravenous Every 24 hours 03/11/21 1521     03/11/21 1400  meropenem (MERREM) 1 g in sodium chloride 0.9 % 100 mL IVPB  Status:  Discontinued        1 g 200 mL/hr over 30 Minutes Intravenous Every 8 hours 03/11/21 0858 03/11/21 1521   03/09/21 2200  ceFEPIme (MAXIPIME) 2 g in sodium chloride 0.9 % 100 mL IVPB   Status:  Discontinued        2 g 200 mL/hr over 30 Minutes Intravenous Every 8 hours 03/09/21 1746 03/11/21 0858   03/09/21 1200  ceFEPIme (MAXIPIME) 2 g in sodium chloride 0.9 % 100 mL IVPB        2 g 200 mL/hr over 30 Minutes Intravenous  Once 03/09/21 1153 03/09/21 1352   03/09/21 1145  cefTRIAXone (ROCEPHIN) 1 g in sodium chloride 0.9 % 100 mL IVPB  Status:  Discontinued        1 g 200 mL/hr over 30 Minutes Intravenous Every 24 hours 03/09/21 1144 03/09/21 1145        Subjective: Seen and examined at bedside with the translator Harmile #[id number] and she was doing well. No CP or SOB. Denied any complaints. No lightheadedness or dizziness. No other concerns or complaints at this time.   Objective: Vitals:   03/12/21 1508 03/12/21 2131 03/13/21 0500 03/13/21 0625  BP: 122/82 123/90  123/82  Pulse:  65  71  Resp:  15  16  Temp:  98 F (36.7 C)  98.4 F (36.9 C)  TempSrc:  Oral  Oral  SpO2:  100%  100%  Weight:   60.5 kg   Height:        Intake/Output Summary (Last 24 hours) at 03/13/2021 2671 Last data filed at 03/12/2021 1408 Gross per 24 hour  Intake 220 ml  Output --  Net 220 ml    Filed Weights   03/11/21 0500 03/12/21 0500 03/13/21 0500  Weight: 62.3 kg 61.3 kg 60.5 kg   Examination: Physical Exam:  Constitutional: WN/WD overweight Hispanic female in NAD and appears calm and comfortable Eyes: Lids and conjunctivae normal, sclerae anicteric  ENMT: External Ears, Nose appear normal. Grossly normal hearing.   Neck: Appears normal, supple, no cervical masses, normal ROM, no appreciable thyromegaly; Has a right IJ Catheter  Respiratory: Diminished to auscultation bilaterally, no wheezing, rales, rhonchi or crackles. Normal respiratory effort and patient is not tachypenic. No accessory muscle use. Unlabored breathing  Cardiovascular: RRR, no murmurs / rubs / gallops. S1 and S2 auscultated. No extremity edema.  Abdomen: Soft, non-tender, non-distended. Bowel sounds  positive.  GU: Deferred. Musculoskeletal: No clubbing / cyanosis of digits/nails. No joint deformity upper and lower extremities.  Skin: No rashes, lesions, ulcers on a limited skin evaluation. No induration; Warm and dry.  Neurologic: CN 2-12 grossly intact with no focal deficits. Romberg  sign and cerebellar reflexes not assessed.  Psychiatric: Normal judgment and insight. Alert and oriented x 3. Normal mood and appropriate affect.   Data Reviewed: I have personally reviewed following labs and imaging studies  CBC: Recent Labs  Lab 03/09/21 1143 03/10/21 0313 03/11/21 0413 03/12/21 0946 03/13/21 0350  WBC 18.6* 15.0* 11.6* 6.5 6.0  NEUTROABS 17.1* 12.1* 9.5* 4.8 3.5  HGB 13.4 11.7* 12.0 12.2 11.9*  HCT 40.2 36.1 37.5 37.1 36.8  MCV 86.6 89.4 88.7 87.3 88.0  PLT 163 130* 130* 151 213    Basic Metabolic Panel: Recent Labs  Lab 03/09/21 1143 03/10/21 0313 03/11/21 0413 03/12/21 0946 03/13/21 0350  NA 135 140 135 138 140  K 3.5 4.3 3.9 3.9 4.2  CL 106 114* 107 107 108  CO2 19* 23 21* 21* 23  GLUCOSE 141* 109* 130* 155* 120*  BUN _0 CREATININE 0.85 0.78 0.91 0.72 0.63  CALCIUM 9.8 8.2* 8.9 9.7 9.9  MG  --  1.2* 1.8 1.4* 1.9  PHOS  --  2.4* 2.0* 2.1* 2.9    GFR: Estimated Creatinine Clearance: 76.2 mL/min (by C-G formula based on SCr of 0.63 mg/dL). Liver Function Tests: Recent Labs  Lab 03/09/21 1143 03/10/21 0313 03/11/21 0413 03/12/21 0946 03/13/21 0350  AST 83* 31 27 43* 26  ALT 106* 60* 52* 72* 60*  ALKPHOS 121 82 109 139* 142*  BILITOT 1.7* 0.9 0.8 0.4 0.5  PROT 7.3 5.5* 6.6 6.9 6.7  ALBUMIN 4.1 3.0* 3.4* 3.6 3.4*    No results for input(s): LIPASE, AMYLASE in the last 168 hours. No results for input(s): AMMONIA in the last 168 hours. Coagulation Profile: Recent Labs  Lab 03/09/21 1143 03/09/21 1846  INR 1.1 1.2    Cardiac Enzymes: No results for input(s): CKTOTAL, CKMB, CKMBINDEX, TROPONINI in the last 168 hours. BNP (last 3  results) No results for input(s): PROBNP in the last 8760 hours. HbA1C: No results for input(s): HGBA1C in the last 72 hours.  CBG: Recent Labs  Lab 03/10/21 0817 03/11/21 0747 03/12/21 0732 03/13/21 0852  GLUCAP 114* 126* 120* 125*    Lipid Profile: No results for input(s): CHOL, HDL, LDLCALC, TRIG, CHOLHDL, LDLDIRECT in the last 72 hours. Thyroid Function Tests: Recent Labs    03/11/21 0413  FREET4 1.03    Anemia Panel: Recent Labs    03/11/21 0413  VITAMINB12 228  FOLATE 8.9  FERRITIN 186  TIBC 259  IRON 18*  RETICCTPCT 1.3    Sepsis Labs: Recent Labs  Lab 03/09/21 1143 03/09/21 1846 03/09/21 2012  LATICACIDVEN 1.2 1.0 1.2     Recent Results (from the past 240 hour(s))  Urine Culture     Status: Abnormal   Collection Time: 03/09/21 11:43 AM   Specimen: In/Out Cath Urine  Result Value Ref Range Status   Specimen Description   Final    IN/OUT CATH URINE Performed at [facility], [place] [address] [place], [place] [address]    Special Requests   Final    NONE Performed at [facility], [place] [address] [place], [place] [address]    Culture (A)  Final    >=100,000 COLONIES/mL ESCHERICHIA COLI Confirmed Extended Spectrum Beta-Lactamase [name] (ESBL).  In bloodstream infections from ESBL organisms, carbapenems are preferred over piperacillin/tazobactam. They are shown to have a lower risk of mortality.    Report Status 03/11/2021 FINAL  Final   Organism ID, Bacteria ESCHERICHIA COLI (A)  Final  Susceptibility   Escherichia coli - MIC*    AMPICILLIN >=32 RESISTANT Resistant     CEFAZOLIN >=64 RESISTANT Resistant     CEFEPIME >=32 RESISTANT Resistant     CEFTRIAXONE >=64 RESISTANT Resistant     CIPROFLOXACIN >=4 RESISTANT Resistant     GENTAMICIN >=16 RESISTANT Resistant     IMIPENEM <=0.25 SENSITIVE Sensitive     NITROFURANTOIN <=16 SENSITIVE Sensitive     TRIMETH/SULFA >=320 RESISTANT Resistant      AMPICILLIN/SULBACTAM >=32 RESISTANT Resistant     PIP/TAZO 8 SENSITIVE Sensitive     * >=100,000 COLONIES/mL ESCHERICHIA COLI  Resp Panel by RT-PCR (Flu A&B, Covid) Nasopharyngeal Swab     Status: None   Collection Time: 03/09/21 12:09 PM   Specimen: Nasopharyngeal Swab; Nasopharyngeal(NP) swabs in vial transport medium  Result Value Ref Range Status   SARS Coronavirus 2 by RT PCR NEGATIVE NEGATIVE Final    Comment: (NOTE) SARS-CoV-2 target nucleic acids are NOT DETECTED.  The SARS-CoV-2 RNA is generally detectable in upper respiratory specimens during the acute phase of infection. The lowest concentration of SARS-CoV-2 viral copies this assay can detect is 138 copies/mL. A negative result does not preclude SARS-Cov-2 infection and should not be used as the sole basis for treatment or other patient management decisions. A negative result may occur with  improper specimen collection/handling, submission of specimen other than nasopharyngeal swab, presence of viral mutation(s) within the areas targeted by this assay, and inadequate number of viral copies(<138 copies/mL). A negative result must be combined with clinical observations, patient history, and epidemiological information. The expected result is Negative.  Fact Sheet for Patients:  EntrepreneurPulse.com.au  Fact Sheet for Healthcare Providers:  IncredibleEmployment.be  This test is no t yet approved or cleared by the [place] FDA and  has been authorized for detection and/or diagnosis of SARS-CoV-2 by FDA under an Emergency Use Authorization (EUA). This EUA will remain  in effect (meaning this test can be used) for the duration of the COVID-19 declaration under Section 564(b)(1) of the Act, 21 U.S.C.section 360bbb-3(b)(1), unless the authorization is terminated  or revoked sooner.       Influenza A by PCR NEGATIVE NEGATIVE Final   Influenza B by PCR NEGATIVE NEGATIVE Final     Comment: (NOTE) The Xpert Xpress SARS-CoV-2/FLU/RSV plus assay is intended as an aid in the diagnosis of influenza from Nasopharyngeal swab specimens and should not be used as a sole basis for treatment. Nasal washings and aspirates are unacceptable for Xpert Xpress SARS-CoV-2/FLU/RSV testing.  Fact Sheet for Patients: EntrepreneurPulse.com.au  Fact Sheet for Healthcare Providers: IncredibleEmployment.be  This test is not yet approved or cleared by the [place] FDA and has been authorized for detection and/or diagnosis of SARS-CoV-2 by FDA under an Emergency Use Authorization (EUA). This EUA will remain in effect (meaning this test can be used) for the duration of the COVID-19 declaration under Section 564(b)(1) of the Act, 21 U.S.C. section 360bbb-3(b)(1), unless the authorization is terminated or revoked.  Performed at [facility], [place] [address], [place], [place] [address]   Blood Culture (routine x 2)     Status: None (Preliminary result)   Collection Time: 03/09/21 12:09 PM   Specimen: BLOOD  Result Value Ref Range Status   Specimen Description   Final    BLOOD BLOOD RIGHT FOREARM Performed at [place] [address], [place],  [address]    Special Requests   Final    BOTTLES DRAWN  AEROBIC AND ANAEROBIC Blood Culture results may not be optimal due to an inadequate volume of blood received in culture bottles Performed at [facility], [place] [address] [place], [place] [address]    Culture   Final    NO GROWTH 3 DAYS Performed at [facility], [place] [address] [place], [place] [address]    Report Status PENDING  Incomplete  Blood Culture (routine x 2)     Status: None (Preliminary result)   Collection Time: 03/09/21 12:09 PM   Specimen: BLOOD RIGHT HAND  Result Value Ref Range Status   Specimen Description   Final    BLOOD RIGHT HAND Performed at [place] [address] [place], [place] [address]    Special Requests   Final    BOTTLES DRAWN AEROBIC AND ANAEROBIC Blood Culture adequate volume Performed at [place] [address], [place], [place] [address]    Culture   Final    NO GROWTH 3 DAYS Performed at [facility], [place] [address], [place], [place] [address]    Report Status PENDING  Incomplete  Group A Strep by PCR     Status: None   Collection Time: 03/09/21  1:05 PM   Specimen: Throat; Sterile Swab  Result Value Ref Range Status   Group A Strep by PCR NOT DETECTED NOT DETECTED Final    Comment: Performed at [facility], [place] [address], [place], [place] [address]    RN Pressure Injury Documentation:     Estimated body mass index is 26.94 kg/m as calculated from the following:   Height as of this encounter: _0  (1.499 m).   Weight as of this encounter: 60.5 kg.  Malnutrition Type:   Malnutrition Characteristics:   Nutrition Interventions:   Radiology Studies: IR Fluoro Guide CV Line Right  Result Date: 03/11/2021 INDICATION: 0 history of end-stage renal disease, post renal transplantation, now with urinary tract infection, in need of durable intravenous access for intravenous antibiotics. EXAM: ULTRASOUND AND FLUOROSCOPIC GUIDED PLACEMENT OF TUNNELED CENTRAL VENOUS CATHETER MEDICATIONS: None. The patient admitted to the hospital receiving intravenous. The antibiotics were administered within an appropriate prior to initiation the procedure. ANESTHESIA/SEDATION: None FLUOROSCOPY TIME:  18 seconds (2 mGy) COMPLICATIONS: None immediate. PROCEDURE: Informed written consent was obtained from the patient after a discussion of the risks, benefits, and alternatives to treatment. Questions regarding the procedure were encouraged and answered. The right neck and chest were prepped with chlorhexidine in a sterile fashion, and a sterile drape was applied  covering the operative field. Maximum barrier sterile technique with sterile gowns and gloves were used for the procedure. A timeout was performed prior to the initiation of the procedure. After the overlying soft tissues were anesthetized, a small venotomy incision was created and a micropuncture kit was utilized to access the internal jugular vein. Real-time ultrasound guidance was utilized for vascular access including the acquisition of a permanent ultrasound image documenting patency of the accessed vessel. The microwire was utilized to measure appropriate catheter length. The micropuncture sheath was exchanged for a peel-away sheath over a guidewire. A 5 French single lumen tunneled central venous catheter measuring 23 cm was tunneled in a retrograde fashion from the anterior chest wall to the venotomy incision. The catheter was then placed through the peel-away sheath with tip ultimately positioned at the superior caval-atrial junction. Final catheter positioning was confirmed and documented with a spot radiographic image. The catheter aspirates  and flushes normally. The catheter was flushed with appropriate volume heparin dwells. The catheter exit site was secured with a 0-Prolene retention suture. The venotomy incision was closed with an interrupted 4-0 Vicryl, Dermabond and Steri-strips. Dressings were applied. The patient tolerated the procedure well without immediate post procedural complication. FINDINGS: After catheter placement, the tip lies within the superior cavoatrial junction. The catheter aspirates and flushes normally and is ready for immediate use. IMPRESSION: Successful placement of 23 cm single lumen tunneled central venous catheter via the right internal jugular vein with tip terminating at the superior caval atrial junction. The catheter is ready for immediate use. Electronically Signed   By: Sandi Mariscal M.D.   On: 03/11/2021 16:27   IR US Guide Vasc Access Right  Result Date:  03/11/2021 INDICATION: 0 history of end-stage renal disease, post renal transplantation, now with urinary tract infection, in need of durable intravenous access for intravenous antibiotics. EXAM: ULTRASOUND AND FLUOROSCOPIC GUIDED PLACEMENT OF TUNNELED CENTRAL VENOUS CATHETER MEDICATIONS: None. The patient admitted to the hospital receiving intravenous. The antibiotics were administered within an appropriate prior to initiation the procedure. ANESTHESIA/SEDATION: None FLUOROSCOPY TIME:  18 seconds (2 mGy) COMPLICATIONS: None immediate. PROCEDURE: Informed written consent was obtained from the patient after a discussion of the risks, benefits, and alternatives to treatment. Questions regarding the procedure were encouraged and answered. The right neck and chest were prepped with chlorhexidine in a sterile fashion, and a sterile drape was applied covering the operative field. Maximum barrier sterile technique with sterile gowns and gloves were used for the procedure. A timeout was performed prior to the initiation of the procedure. After the overlying soft tissues were anesthetized, a small venotomy incision was created and a micropuncture kit was utilized to access the internal jugular vein. Real-time ultrasound guidance was utilized for vascular access including the acquisition of a permanent ultrasound image documenting patency of the accessed vessel. The microwire was utilized to measure appropriate catheter length. The micropuncture sheath was exchanged for a peel-away sheath over a guidewire. A 5 French single lumen tunneled central venous catheter measuring 23 cm was tunneled in a retrograde fashion from the anterior chest wall to the venotomy incision. The catheter was then placed through the peel-away sheath with tip ultimately positioned at the superior caval-atrial junction. Final catheter positioning was confirmed and documented with a spot radiographic image. The catheter aspirates and flushes normally.  The catheter was flushed with appropriate volume heparin dwells. The catheter exit site was secured with a 0-Prolene retention suture. The venotomy incision was closed with an interrupted 4-0 Vicryl, Dermabond and Steri-strips. Dressings were applied. The patient tolerated the procedure well without immediate post procedural complication. FINDINGS: After catheter placement, the tip lies within the superior cavoatrial junction. The catheter aspirates and flushes normally and is ready for immediate use. IMPRESSION: Successful placement of 23 cm single lumen tunneled central venous catheter via the right internal jugular vein with tip terminating at the superior caval atrial junction. The catheter is ready for immediate use. Electronically Signed   By: Sandi Mariscal M.D.   On: 03/11/2021 16:27    Scheduled Meds:  Chlorhexidine Gluconate Cloth  6 each Topical Q0600   docusate sodium  100 mg Oral BID   heparin  5,000 Units Subcutaneous Q8H   iron polysaccharides  150 mg Oral Daily   mycophenolate  720 mg Oral BID   predniSONE  5 mg Oral QAC breakfast   sodium bicarbonate  1,950 mg Oral TID   sodium  chloride flush  10-40 mL Intracatheter Q12H   tacrolimus  6 mg Oral QHS   tacrolimus  7 mg Oral Daily   Continuous Infusions:  ertapenem 1,000 mg (03/12/21 2141)    LOS: 4 days   Kerney Elbe, DO [facility] PAGER is on AMION  If 7PM-7AM, please contact night-coverage [email or web address]

## 2021-03-14 DIAGNOSIS — D649 Anemia, unspecified: Secondary | ICD-10-CM

## 2021-03-14 LAB — CULTURE, BLOOD (ROUTINE X 2)
Culture: NO GROWTH
Culture: NO GROWTH
Special Requests: ADEQUATE

## 2021-03-14 LAB — CBC WITH DIFFERENTIAL/PLATELET
Abs Immature Granulocytes: 0.03 10*3/uL (ref 0.00–0.07)
Basophils Absolute: 0 10*3/uL (ref 0.0–0.1)
Basophils Relative: 0 %
Eosinophils Absolute: 0.1 10*3/uL (ref 0.0–0.5)
Eosinophils Relative: 2 %
HCT: 37.8 % (ref 36.0–46.0)
Hemoglobin: 12.1 g/dL (ref 12.0–15.0)
Immature Granulocytes: 1 %
Lymphocytes Relative: 32 %
Lymphs Abs: 2.1 10*3/uL (ref 0.7–4.0)
MCH: 28.1 pg (ref 26.0–34.0)
MCHC: 32 g/dL (ref 30.0–36.0)
MCV: 87.9 fL (ref 80.0–100.0)
Monocytes Absolute: 0.7 10*3/uL (ref 0.1–1.0)
Monocytes Relative: 10 %
Neutro Abs: 3.6 10*3/uL (ref 1.7–7.7)
Neutrophils Relative %: 55 %
Platelets: 198 10*3/uL (ref 150–400)
RBC: 4.3 MIL/uL (ref 3.87–5.11)
RDW: 13.9 % (ref 11.5–15.5)
WBC: 6.5 10*3/uL (ref 4.0–10.5)
nRBC: 0 % (ref 0.0–0.2)

## 2021-03-14 LAB — COMPREHENSIVE METABOLIC PANEL
ALT: 47 U/L — ABNORMAL HIGH (ref 0–44)
AST: 21 U/L (ref 15–41)
Albumin: 3.4 g/dL — ABNORMAL LOW (ref 3.5–5.0)
Alkaline Phosphatase: 156 U/L — ABNORMAL HIGH (ref 38–126)
Anion gap: 8 (ref 5–15)
BUN: 13 mg/dL (ref 6–20)
CO2: 24 mmol/L (ref 22–32)
Calcium: 9.6 mg/dL (ref 8.9–10.3)
Chloride: 106 mmol/L (ref 98–111)
Creatinine, Ser: 0.7 mg/dL (ref 0.44–1.00)
GFR, Estimated: >60 mL/min (ref >60)
Glucose, Bld: 115 mg/dL — ABNORMAL HIGH (ref 70–99)
Potassium: 4 mmol/L (ref 3.5–5.1)
Sodium: 138 mmol/L (ref 135–145)
Total Bilirubin: 0.4 mg/dL (ref 0.3–1.2)
Total Protein: 7 g/dL (ref 6.5–8.1)

## 2021-03-14 LAB — GLUCOSE, CAPILLARY
Glucose-Capillary: 135 mg/dL — ABNORMAL HIGH (ref 70–99)
Glucose-Capillary: 128 mg/dL — ABNORMAL HIGH (ref 70–99)

## 2021-03-14 LAB — PHOSPHORUS: Phosphorus: 3.4 mg/dL (ref 2.5–4.6)

## 2021-03-14 LAB — MAGNESIUM: Magnesium: 1.5 mg/dL — ABNORMAL LOW (ref 1.7–2.4)

## 2021-03-14 MED ORDER — POLYSACCHARIDE IRON COMPLEX 150 MG PO CAPS: 150.0000 mg | ORAL_CAPSULE | Freq: Every day | ORAL | 0 refills | Status: DC

## 2021-03-14 MED ORDER — DOCUSATE SODIUM 100 MG PO CAPS: 100.0000 mg | ORAL_CAPSULE | Freq: Two times a day (BID) | ORAL | 0 refills | Status: DC

## 2021-03-14 MED ORDER — MAGNESIUM SULFATE 4 GM/100ML IV SOLN
4.0000 g | Freq: Once | INTRAVENOUS | Status: DC
  Filled 2021-03-14: qty 100

## 2021-03-14 MED ORDER — ACETAMINOPHEN 325 MG PO TABS: 650.0000 mg | ORAL_TABLET | Freq: Four times a day (QID) | ORAL | 0 refills | Status: DC | PRN

## 2021-03-14 MED ORDER — ERTAPENEM IV (FOR PTA / DISCHARGE USE ONLY): 1.0000 g | INTRAVENOUS | 0 refills | Status: AC

## 2021-03-14 MED ORDER — MAGNESIUM SULFATE 50 % IJ SOLN: 3.0000 g | Freq: Once | INTRAVENOUS | Status: DC

## 2021-03-14 MED ORDER — POLYETHYLENE GLYCOL 3350 17 G PO PACK: 17.0000 g | PACK | Freq: Every day | ORAL | 0 refills | Status: DC | PRN

## 2021-03-14 MED ORDER — ONDANSETRON HCL 4 MG PO TABS: 4.0000 mg | ORAL_TABLET | Freq: Four times a day (QID) | ORAL | 0 refills | Status: DC | PRN

## 2021-03-14 NOTE — Discharge Summary (Signed)
Physician Discharge Summary  Katie Lyons [id number] DOB: 1985/10/13 DOA: 03/09/2021  PCP: Fleet Contras, MD  Admit date: 03/09/2021 Discharge date: 03/14/2021  Admitted From: Home Disposition: Home with Home Health RN  Recommendations for Outpatient Follow-up:  Follow up with PCP in 1-2 weeks Follow up with Transplant Nephrology with in 1-2 weeks Follow up with ID Dr. Linus Salmons on 03/23/21 at 8:45 pm and arrange for Catheter removal for 8/11 or later Please obtain CMP/CBC, Magl Phos in one week and repeat the following week and repeat ESR and CRP every other week while on Abx Repeat TFT's in 4-6 weeks  Please follow up on the following pending results:  Home Health: Yes Equipment/Devices: None  Discharge Condition: Stable  CODE STATUS: FULL CODE Diet recommendation: Carb Modified Diet  Brief/Interim Summary: Katie Lyons is a 35 y.o. female with medical history significant for but not limited to history of depression and anxiety, chronic ischemic heart disease, history of end-stage renal disease on dialysis in 2008 status post renal transplantation in September 2019 currently on Prograf and mycophenolate, hypertension, iron deficiency anemia, history of TBI, history of secondary hyperparathyroidism as well as other comorbidities who presented with a chief complaint of burning in the urine as well and whole body pain.  Patient was seen and examined at the interpreter Katie Lyons #[id number] and the patient states that she developed fever, chills, generalized body aches and burning with urination that started this yesterday into this morning.  She took Tylenol without relief of her symptoms and her last dose of Tylenol was 8 AM this morning.  She was recently admitted to [facility] for pyelonephritis of the renal transplant and presented with concern for fever body aches as well as dysuria.  She describes body aches and fever that started last night into this morning and  describes the pain 8 out of 10 in all over as well as a significant headache with photophobia.  She states that the pain with urination is improved with Tylenol and her appetite is okay.  She states that she vomited this morning but has not had any diarrhea or cough.  Complains of dry mouth and dry throat as well as sore throat.  She had no sick contacts noted.  TRH was asked admit this patient for sepsis secondary to acute pyelonephritis   ED Course: In the ED she is given 2 L of lactated ringer boluses as well as IV cefepime and given IV fentanyl and IV antibiotics with cefepime.  She was also given acetaminophen 650 mg.  COVID testing was negative.   **Interim History Sepsis Physiology is improving and she is feeling better. WBC is improving and Urine Cx growing >100,000 CFU of E Coli and is now ESBL E Coli only sensitive to Imipenem, Nitrofurantoin, and PIP/TAZO.  She was changed to IV meropenem and given her ESBL E. coli UTI we will have to place a IJ PICC given that no arm PICC given her history of ESRD.  ID has been consulted for further evaluation recommendations and they are recommending ertapenem to complete 14 days and following up on 03/23/2021 at 2:45 PM with ID and arrangement of removal of IR catheter tunnel for 8/11 or later. She has improved and she is deemed medically stable for discharge now that she can get her antibiotics at home.  She will need to follow-up with PCP, transplant nephrology as well as ID in the outpatient setting within 1 to 2 weeks.  Discharge Diagnoses:  Active Problems:  Pyelonephritis    Sepsis secondary to E Coli pyelonephritis in the setting of her transplanted kidney -Unclear as she recovered from her infection in June -Met sepsis physiology on admission with tachycardia, tachypnea as well as a fever and leukocytosis associated with urinary tract symptoms; Has an E Coli UTI -Urinalysis done and showed a hazy appearance with moderate hemoglobin, moderate  leukocytes, negative nitrates, few bacteria, 6-10 RBCs per high-power field, greater than 50 WBCs with urine culture showing >100,000 ESBL E Coli with Sensitivities as above. Blood culture x2 show NGTD <24 Hours   -CT Renal Stone Study done and showed "Although stranding and fluid about the patient's right lower quadrant renal transplant are improved compared to the prior CT, findings could be due to pyelonephritis. Negative for hydronephrosis or stone. Aortic Atherosclerosis." -WBC went from 18.6 -> 15.0 -> 11.6 -> 6.5 -> 6.0 -> 6.5 -Given 3 Liters of Boluses and started on mIVF with NS at 100 mL/hr and reduced to 75 mL/hr and now will stop -Continue with IV antibiotics and will Escalate to IV Meropenem and now going to be changed to IV ertapenem every 24 for 14 days -Discussed with urology and they feel that there is no obstruction so there is nothing for them to do.  Also discussed with nephrology who feels there is no indication to stop patient's antirejection drugs -Acetaminophen and Ibuprofen for Fever -C/w Supportive Care -ID Consulted for further Evaluation given ESBL and they are recommending ertapenem to complete 14 days and following up on 03/23/2021 at 2:45 PM was ID and arrangement of removal of IR catheter tunnel for 8/11 or later -Stable to D/C and follow up with Transplant Nephrology and ID as an outpatient    Hx of ESRD now with Renal Transplant Metabolic Acidosis -C/w Renal Transplant Meds with Tacrolimus 7 mg po Daily and 6 mg po qHS and Mycophenolate 720 mg po BID and with p.o. Prednisone 5 mg; may need stress dosing -Had a small Acidosis C/w Sodium Bicarbonate 2600 mg po TID  -IVF as above and will now stop -Patient's BUN/creatinine is stable at 4/0.98  and Metabolic Acidosis is improved with a CO2 of 23, anion gap of 9, chloride level 108 -Continue to monitor and trend and monitor renal function carefully repeat CMP within 1 week    Hypomagnesemia -Patient's Mag Level was  1.5 -Repelte with IV Mag Sulfate 4 grams prior to D/C  -Continue to Monitor and Replete as Necessary -Repeat Mag Level in the Am   Hypophosphatemia -Patient's Phos Level is 3.4 -Continue to Monitor and Replete as Necessary -Repeat Phos Level in the AM   Abnormal LFTs -In the setting of sepsis and infection -AST is improved and is 21 and ALT is 47 -IF LFT's continue to worsen and not improve we will obtain a right upper quadrant ultrasound and acute hepatitis panel but can be done in the outpatient setting  -Continue to Monitor and Trend  -Repeat CMP in the AM   Headache, improved  -Had Photophobia -No Nucal Rigidity -Continue to Monitor for Meningitis -Head CT done and showed "No acute intracranial abnormality." -Will provide Fiorcet    Normocytic Anemia -Patient's Hgb/Hct went from 13.4/40.2 -> 11.7/36.1 -> 12.0/37.5 -> 12.2/37.1 -> 11.9/36.8 -> 12.1/37.8 -Checked Anemia Panel and showed an iron level of 18, U IBC of 241, TIBC of 259, saturation of 7%, ferritin level 186, folate level 8.9, and vitamin B12 level of 228 -Likely Dilutional Drop -Started Iron Supplementation with Iron Polysaccharides 150 mg  po Daily -Continue to Monitor for S/Sx of Bleeding; No overt Bleeding noted -Repeat CBC in the AM   Thrombocytopenia, improved -Patient's Platelet Count went from 163 -> 130 x2 and is now 151 -> 166 -> 198 -Continue to Monitor for S/Sx of Bleeding; No overt bleeding noted -Repeat CBC in the AM   Low TSH -TSH was 0.175 -Checked Free T4 and was 1.03; T3 was normal at 85 -Repeat Thyroid Studies in 4-6 weeks    Prediabetes -Patient's HbA1c was 6.1 -Continue to Monitor CBG's per protocol; CBG's ranging from 114 - 126 still and blood sugar this morning on CMP was 120 -If Necessary will place on Sensitive Novolog SSI [facility]  Discharge Instructions  Discharge Instructions     [facility] pharmacist to adjust dose for Vancomycin, Aminoglycosides and other  anti-infective therapies as requested by physician.   Complete by: As directed    [facility] to provide Cath Flo 519m   Complete by: As directed    Administer for PICC line occlusion and as ordered by physician for other access device issues.   Anaphylaxis Kit: Provided to treat any anaphylactic reaction to the medication being provided to the patient if First Dose or when requested by physician   Complete by: As directed    Epinephrine 168mml vial / amp: Administer 0.19m59m0.19ml66mubcutaneously once for moderate to severe anaphylaxis, nurse to call physician and pharmacy when reaction occurs and call 911 if needed for immediate care   Diphenhydramine 50mg82mIV vial: Administer 25-50mg 72mM PRN for first dose reaction, rash, itching, mild reaction, nurse to call physician and pharmacy when reaction occurs   Sodium Chloride 0.9% NS 500ml I75mdminister if needed for hypovolemic blood pressure drop or as ordered by physician after call to physician with anaphylactic reaction   Call MD for:  difficulty breathing, headache or visual disturbances   Complete by: As directed    Call MD for:  extreme fatigue   Complete by: As directed    Call MD for:  hives   Complete by: As directed    Call MD for:  persistant dizziness or light-headedness   Complete by: As directed    Call MD for:  persistant nausea and vomiting   Complete by: As directed    Call MD for:  redness, tenderness, or signs of infection (pain, swelling, redness, odor or green/yellow discharge around incision site)   Complete by: As directed    Call MD for:  severe uncontrolled pain   Complete by: As directed    Call MD for:  temperature >100.4   Complete by: As directed    Change dressing on IV access line weekly and PRN   Complete by: As directed    Diet - low sodium heart healthy   Complete by: As directed    Discharge instructions   Complete by: As directed    You were cared for by a hospitalist during your  hospital stay. If you have any questions about your discharge medications or the care you received while you were in the hospital after you are discharged, you can call the unit and ask to speak with the hospitalist on call if the hospitalist that took care of you is not available. Once you are discharged, your primary care physician will handle any further medical issues. Please note that NO REFILLS for any discharge medications will be authorized once you are discharged, as it is imperative that you return to your primary care  physician (or establish a relationship with a primary care physician if you do not have one) for your aftercare needs so that they can reassess your need for medications and monitor your lab values.  Follow up with PCP, Transplant Nephrology, and ID as an outpatient setting. Take all medications as prescribed. If symptoms change or worsen please return to the ED for evaluation   Flush IV access with Sodium Chloride 0.9% and Heparin 10 units/ml or 100 units/ml   Complete by: As directed    Home infusion instructions - [facility]   Complete by: As directed    Instructions: Flush IV access with Sodium Chloride 0.9% and Heparin 10units/ml or 100units/ml   Change dressing on IV access line: Weekly and PRN   Instructions Cath Flo 110m: Administer for PICC Line occlusion and as ordered by physician for other access device   [facility] pharmacist to adjust dose for: Vancomycin, Aminoglycosides and other anti-infective therapies as requested by physician   Increase activity slowly   Complete by: As directed    Method of administration may be changed at the discretion of home infusion pharmacist based upon assessment of the patient and/or caregiver's ability to self-administer the medication ordered   Complete by: As directed       Allergies as of 03/14/2021   No Known Allergies      Medication List     TAKE these medications    acetaminophen 325 MG  tablet Commonly known as: TYLENOL Take 2 tablets (650 mg total) by mouth every 6 (six) hours as needed for fever.   docusate sodium 100 MG capsule Commonly known as: COLACE Take 1 capsule (100 mg total) by mouth 2 (two) times daily.   ertapenem  IVPB Commonly known as: INVANZ Inject 1 g into the vein daily for 13 days. Indication:  ESBL pyelonephritis First Dose: Yes Last Day of Therapy:  03/24/2021 Labs - Once weekly:  CBC/D and BMP, Labs - Every other week:  ESR and CRP Method of administration: Mini-Bag Plus / Gravity Method of administration may be changed at the discretion of home infusion pharmacist based upon assessment of the patient and/or caregiver's ability to self-administer the medication ordered.   iron polysaccharides 150 MG capsule Commonly known as: NIFEREX Take 1 capsule (150 mg total) by mouth daily. Start taking on: March 15, 2021   magnesium oxide 400 MG tablet Commonly known as: MAG-OX Take 400 mg by mouth 2 (two) times daily.   mycophenolate 180 MG EC tablet Commonly known as: MYFORTIC Take 360 mg by mouth See admin instructions. Take 4 tablets in the morning and 4 tablets in the evening.   ondansetron 4 MG tablet Commonly known as: ZOFRAN Take 1 tablet (4 mg total) by mouth every 6 (six) hours as needed for nausea.   polyethylene glycol 17 g packet Commonly known as: MIRALAX / GLYCOLAX Take 17 g by mouth daily as needed for mild constipation.   predniSONE 5 MG tablet Commonly known as: DELTASONE Take 1 tablet (5 mg total) by mouth daily. Resume after finishing steroid taper.   sodium bicarbonate 650 MG tablet Take 2,600 mg by mouth 3 (three) times daily. Three tabs (19588m three times daily   tacrolimus 1 MG capsule Commonly known as: PROGRAF Take 6-7 mg by mouth See admin instructions. Take 7 tablets in the morning and 6 tablets at night.               Discharge Care Instructions  (From  admission, onward)           Start      Ordered   03/14/21 0000  Change dressing on IV access line weekly and PRN  (Home infusion instructions - [facility] )        03/14/21 [facility] Follow up.   Why: Please call [facility] if you have any questions. [phone] Contact information: [phone]        Fleet Contras, MD. Call.   Specialty: Nephrology Why: Follow up within 1-2 weeks Contact information: [place] [address] [phone]         Thayer Headings, MD. Go on 03/23/2021.   Specialty: Infectious Diseases Why: Appointment scheduled for 03/23/21 at 2:45 pm Contact information: [address] [place] [address] [phone]                No Known Allergies  Consultations: ID Discussed with Nephrology and Urology  Procedures/Studies: CT HEAD WO CONTRAST  Result Date: 03/09/2021 CLINICAL DATA:  Headache, fever EXAM: CT HEAD WITHOUT CONTRAST TECHNIQUE: Contiguous axial images were obtained from the base of the skull through the vertex without intravenous contrast. COMPARISON:  11/14/2012 FINDINGS: Brain: No acute intracranial abnormality. Specifically, no hemorrhage, hydrocephalus, mass lesion, acute infarction, or significant intracranial injury. Area of encephalomalacia within the anterior right frontal lobe in area of previously seen hemorrhage. Vascular: No hyperdense vessel or unexpected calcification. Skull: No acute calvarial abnormality. Sinuses/Orbits: No acute findings Other: None IMPRESSION: No acute intracranial abnormality. Electronically Signed   By: Rolm Baptise M.D.   On: 03/09/2021 16:57   IR Fluoro Guide CV Line Right  Result Date: 03/11/2021 INDICATION: 0 history of end-stage renal disease, post renal transplantation, now with urinary tract infection, in need of durable intravenous access for intravenous antibiotics. EXAM: ULTRASOUND AND FLUOROSCOPIC GUIDED  PLACEMENT OF TUNNELED CENTRAL VENOUS CATHETER MEDICATIONS: None. The patient admitted to the hospital receiving intravenous. The antibiotics were administered within an appropriate prior to initiation the procedure. ANESTHESIA/SEDATION: None FLUOROSCOPY TIME:  18 seconds (2 mGy) COMPLICATIONS: None immediate. PROCEDURE: Informed written consent was obtained from the patient after a discussion of the risks, benefits, and alternatives to treatment. Questions regarding the procedure were encouraged and answered. The right neck and chest were prepped with chlorhexidine in a sterile fashion, and a sterile drape was applied covering the operative field. Maximum barrier sterile technique with sterile gowns and gloves were used for the procedure. A timeout was performed prior to the initiation of the procedure. After the overlying soft tissues were anesthetized, a small venotomy incision was created and a micropuncture kit was utilized to access the internal jugular vein. Real-time ultrasound guidance was utilized for vascular access including the acquisition of a permanent ultrasound image documenting patency of the accessed vessel. The microwire was utilized to measure appropriate catheter length. The micropuncture sheath was exchanged for a peel-away sheath over a guidewire. A 5 French single lumen tunneled central venous catheter measuring 23 cm was tunneled in a retrograde fashion from the anterior chest wall to the venotomy incision. The catheter was then placed through the peel-away sheath with tip ultimately positioned at the superior caval-atrial junction. Final catheter positioning was confirmed and documented with a spot radiographic image. The catheter aspirates and flushes normally. The catheter was flushed with appropriate volume heparin dwells. The catheter exit site was  secured with a 0-Prolene retention suture. The venotomy incision was closed with an interrupted 4-0 Vicryl, Dermabond and Steri-strips.  Dressings were applied. The patient tolerated the procedure well without immediate post procedural complication. FINDINGS: After catheter placement, the tip lies within the superior cavoatrial junction. The catheter aspirates and flushes normally and is ready for immediate use. IMPRESSION: Successful placement of 23 cm single lumen tunneled central venous catheter via the right internal jugular vein with tip terminating at the superior caval atrial junction. The catheter is ready for immediate use. Electronically Signed   By: Sandi Mariscal M.D.   On: 03/11/2021 16:27   IR US Guide Vasc Access Right  Result Date: 03/11/2021 INDICATION: 0 history of end-stage renal disease, post renal transplantation, now with urinary tract infection, in need of durable intravenous access for intravenous antibiotics. EXAM: ULTRASOUND AND FLUOROSCOPIC GUIDED PLACEMENT OF TUNNELED CENTRAL VENOUS CATHETER MEDICATIONS: None. The patient admitted to the hospital receiving intravenous. The antibiotics were administered within an appropriate prior to initiation the procedure. ANESTHESIA/SEDATION: None FLUOROSCOPY TIME:  18 seconds (2 mGy) COMPLICATIONS: None immediate. PROCEDURE: Informed written consent was obtained from the patient after a discussion of the risks, benefits, and alternatives to treatment. Questions regarding the procedure were encouraged and answered. The right neck and chest were prepped with chlorhexidine in a sterile fashion, and a sterile drape was applied covering the operative field. Maximum barrier sterile technique with sterile gowns and gloves were used for the procedure. A timeout was performed prior to the initiation of the procedure. After the overlying soft tissues were anesthetized, a small venotomy incision was created and a micropuncture kit was utilized to access the internal jugular vein. Real-time ultrasound guidance was utilized for vascular access including the acquisition of a permanent ultrasound  image documenting patency of the accessed vessel. The microwire was utilized to measure appropriate catheter length. The micropuncture sheath was exchanged for a peel-away sheath over a guidewire. A 5 French single lumen tunneled central venous catheter measuring 23 cm was tunneled in a retrograde fashion from the anterior chest wall to the venotomy incision. The catheter was then placed through the peel-away sheath with tip ultimately positioned at the superior caval-atrial junction. Final catheter positioning was confirmed and documented with a spot radiographic image. The catheter aspirates and flushes normally. The catheter was flushed with appropriate volume heparin dwells. The catheter exit site was secured with a 0-Prolene retention suture. The venotomy incision was closed with an interrupted 4-0 Vicryl, Dermabond and Steri-strips. Dressings were applied. The patient tolerated the procedure well without immediate post procedural complication. FINDINGS: After catheter placement, the tip lies within the superior cavoatrial junction. The catheter aspirates and flushes normally and is ready for immediate use. IMPRESSION: Successful placement of 23 cm single lumen tunneled central venous catheter via the right internal jugular vein with tip terminating at the superior caval atrial junction. The catheter is ready for immediate use. Electronically Signed   By: Sandi Mariscal M.D.   On: 03/11/2021 16:27   DG Chest Port 1 View  Result Date: 03/09/2021 CLINICAL DATA:  Questionable sepsis, fever and chills and body aches. Tylenol without relief of symptoms. EXAM: PORTABLE CHEST 1 VIEW COMPARISON:  August 31, 2019. FINDINGS: EKG leads projecting over the chest. Cardiomediastinal contours with persistent cardiac enlargement and pulmonary vascular engorgement. No lobar consolidative changes. No sign of pleural effusion. No visible pneumothorax. On limited assessment there is no acute skeletal process. IMPRESSION: 1.  Cardiomegaly with persistent pulmonary vascular engorgement. 2. No  acute cardiopulmonary disease. Electronically Signed   By: Zetta Bills M.D.   On: 03/09/2021 12:49   CT Renal Stone Study  Result Date: 03/09/2021 CLINICAL DATA:  Fever, chills, body aches and burning with urination since yesterday. EXAM: CT ABDOMEN AND PELVIS WITHOUT CONTRAST TECHNIQUE: Multidetector CT imaging of the abdomen and pelvis was performed following the standard protocol without IV contrast. COMPARISON:  CT abdomen and pelvis 01/22/2021. FINDINGS: Lower chest: Lung bases demonstrate mild dependent atelectasis. Heart size is normal. No pleural or pericardial effusion. Hepatobiliary: No focal liver abnormality is seen. No gallstones, gallbladder wall thickening, or biliary dilatation. Pancreas: Unremarkable. No pancreatic ductal dilatation or surrounding inflammatory changes. Spleen: Normal in size without focal abnormality. Adrenals/Urinary Tract: Adrenal glands appear normal. Markedly atrophic native kidneys are unchanged. The patient has a right lower quadrant renal transplant. There is stranding and some fluid about the transplant kidney which are improved compared to the prior CT. No hydronephrosis or stone. There is no focal perinephric fluid collection. Stomach/Bowel: Stomach is within normal limits. Appendix appears normal. No evidence of bowel wall thickening, distention, or inflammatory changes. Vascular/Lymphatic: Aortic atherosclerosis. No enlarged abdominal or pelvic lymph nodes. Reproductive: Uterus and bilateral adnexa are unremarkable. Other: Small volume of free pelvic fluid is compatible with physiologic change. Musculoskeletal: Negative. IMPRESSION: Although stranding and fluid about the patient's right lower quadrant renal transplant are improved compared to the prior CT, findings could be due to pyelonephritis. Negative for hydronephrosis or stone. Aortic Atherosclerosis (ICD10-I70.0). Electronically Signed   By:  Inge Rise M.D.   On: 03/09/2021 13:31     Subjective: Seen and examined at bedside and she was doing well.  She was seen with assistance of the translator Andree Moro [phone].  She states that she is feeling well and denies any complaints.  Ready to go home.  No nausea or vomiting.  No burning or discomfort.  No other concerns or complaints at this time.  Discharge Exam: Vitals:   03/14/21 1100 03/14/21 1214  BP:  114/77  Pulse:  70  Resp: (!) 24   Temp:  98.1 F (36.7 C)  SpO2:  100%   Vitals:   03/14/21 0900 03/14/21 1000 03/14/21 1100 03/14/21 1214  BP:    114/77  Pulse:    70  Resp: (!) 21 19 (!) 24   Temp:    98.1 F (36.7 C)  TempSrc:    Oral  SpO2:    100%  Weight:      Height:       General: Pt is alert, awake, not in acute distress Cardiovascular: RRR, S1/S2 +, no rubs, no gallops Respiratory: CTA bilaterally, no wheezing, no rhonchi; unlabored breathing Abdominal: Soft, NT, distended slightly, bowel sounds + Extremities: no edema, no cyanosis; has a right IJ tunneled catheter  The results of significant diagnostics from this hospitalization (including imaging, microbiology, ancillary and laboratory) are listed below for reference.    Microbiology: Recent Results (from the past 240 hour(s))  Urine Culture     Status: Abnormal   Collection Time: 03/09/21 11:43 AM   Specimen: In/Out Cath Urine  Result Value Ref Range Status   Specimen Description   Final    IN/OUT CATH URINE Performed at [facility] [address] [place], [place] [address]    Special Requests   Final    NONE Performed at [facility], [place] [address], [place], [place] [address]    Culture (A)  Final    >=  100,000 COLONIES/mL ESCHERICHIA COLI Confirmed Extended Spectrum Beta-Lactamase [name] (ESBL).  In bloodstream infections from ESBL organisms, carbapenems are preferred over piperacillin/tazobactam. They are shown to have a lower risk of  mortality.    Report Status 03/11/2021 FINAL  Final   Organism ID, Bacteria ESCHERICHIA COLI (A)  Final      Susceptibility   Escherichia coli - MIC*    AMPICILLIN >=32 RESISTANT Resistant     CEFAZOLIN >=64 RESISTANT Resistant     CEFEPIME >=32 RESISTANT Resistant     CEFTRIAXONE >=64 RESISTANT Resistant     CIPROFLOXACIN >=4 RESISTANT Resistant     GENTAMICIN >=16 RESISTANT Resistant     IMIPENEM <=0.25 SENSITIVE Sensitive     NITROFURANTOIN <=16 SENSITIVE Sensitive     TRIMETH/SULFA >=320 RESISTANT Resistant     AMPICILLIN/SULBACTAM >=32 RESISTANT Resistant     PIP/TAZO 8 SENSITIVE Sensitive     * >=100,000 COLONIES/mL ESCHERICHIA COLI  Resp Panel by RT-PCR (Flu A&B, Covid) Nasopharyngeal Swab     Status: None   Collection Time: 03/09/21 12:09 PM   Specimen: Nasopharyngeal Swab; Nasopharyngeal(NP) swabs in vial transport medium  Result Value Ref Range Status   SARS Coronavirus 2 by RT PCR NEGATIVE NEGATIVE Final    Comment: (NOTE) SARS-CoV-2 target nucleic acids are NOT DETECTED.  The SARS-CoV-2 RNA is generally detectable in upper respiratory specimens during the acute phase of infection. The lowest concentration of SARS-CoV-2 viral copies this assay can detect is 138 copies/mL. A negative result does not preclude SARS-Cov-2 infection and should not be used as the sole basis for treatment or other patient management decisions. A negative result may occur with  improper specimen collection/handling, submission of specimen other than nasopharyngeal swab, presence of viral mutation(s) within the areas targeted by this assay, and inadequate number of viral copies(<138 copies/mL). A negative result must be combined with clinical observations, patient history, and epidemiological information. The expected result is Negative.  Fact Sheet for Patients:  EntrepreneurPulse.com.au  Fact Sheet for Healthcare Providers:   IncredibleEmployment.be  This test is no t yet approved or cleared by the [place] FDA and  has been authorized for detection and/or diagnosis of SARS-CoV-2 by FDA under an Emergency Use Authorization (EUA). This EUA will remain  in effect (meaning this test can be used) for the duration of the COVID-19 declaration under Section 564(b)(1) of the Act, 21 U.S.C.section 360bbb-3(b)(1), unless the authorization is terminated  or revoked sooner.       Influenza A by PCR NEGATIVE NEGATIVE Final   Influenza B by PCR NEGATIVE NEGATIVE Final    Comment: (NOTE) The Xpert Xpress SARS-CoV-2/FLU/RSV plus assay is intended as an aid in the diagnosis of influenza from Nasopharyngeal swab specimens and should not be used as a sole basis for treatment. Nasal washings and aspirates are unacceptable for Xpert Xpress SARS-CoV-2/FLU/RSV testing.  Fact Sheet for Patients: EntrepreneurPulse.com.au  Fact Sheet for Healthcare Providers: IncredibleEmployment.be  This test is not yet approved or cleared by the [place] FDA and has been authorized for detection and/or diagnosis of SARS-CoV-2 by FDA under an Emergency Use Authorization (EUA). This EUA will remain in effect (meaning this test can be used) for the duration of the COVID-19 declaration under Section 564(b)(1) of the Act, 21 U.S.C. section 360bbb-3(b)(1), unless the authorization is terminated or revoked.  Performed at [facility], [place] [address] [place], [place] [address]   Blood Culture (routine x 2)     Status: None   Collection  Time: 03/09/21 12:09 PM   Specimen: BLOOD  Result Value Ref Range Status   Specimen Description   Final    BLOOD BLOOD RIGHT FOREARM Performed at [facility] [address] [place], [place] [address]    Special Requests   Final    BOTTLES DRAWN AEROBIC AND ANAEROBIC Blood Culture results may not be  optimal due to an inadequate volume of blood received in culture bottles Performed at [facility] [address], [place], [place] [address]    Culture   Final    NO GROWTH 5 DAYS Performed at [facility], [place] [address] [place], [place] [address]    Report Status 03/14/2021 FINAL  Final  Blood Culture (routine x 2)     Status: None   Collection Time: 03/09/21 12:09 PM   Specimen: BLOOD RIGHT HAND  Result Value Ref Range Status   Specimen Description   Final    BLOOD RIGHT HAND Performed at [facility] [address] [place], [place] [address]    Special Requests   Final    BOTTLES DRAWN AEROBIC AND ANAEROBIC Blood Culture adequate volume Performed at [facility] [address], [place], [place] [address]    Culture   Final    NO GROWTH 5 DAYS Performed at [facility], [place] [address], [place], [place] [address]    Report Status 03/14/2021 FINAL  Final  Group A Strep by PCR     Status: None   Collection Time: 03/09/21  1:05 PM   Specimen: Throat; Sterile Swab  Result Value Ref Range Status   Group A Strep by PCR NOT DETECTED NOT DETECTED Final    Comment: Performed at [facility], [place] [address] [place],  [address]    Labs: BNP (last 3 results) No results for input(s): BNP in the last 8760 hours. Basic Metabolic Panel: Recent Labs  Lab 03/10/21 0313 03/11/21 0413 03/12/21 0946 03/13/21 0350 03/14/21 0355  NA 140 135 138 140 138  K 4.3 3.9 3.9 4.2 4.0  CL 114* 107 107 108 106  CO2 23 21* 21* 23 24  GLUCOSE 109* 130* 155* 120* 115*  BUN _0 CREATININE 0.78 0.91 0.72 0.63 0.70  CALCIUM 8.2* 8.9 9.7 9.9 9.6  MG 1.2* 1.8 1.4* 1.9 1.5*  PHOS 2.4* 2.0* 2.1* 2.9 3.4   Liver Function Tests: Recent Labs  Lab 03/10/21 0313 03/11/21 0413 03/12/21 0946 03/13/21 0350 03/14/21 0355  AST 31 27 43* 26 21  ALT 60* 52* 72* 60* 47*  ALKPHOS 82 109 139* 142*  156*  BILITOT 0.9 0.8 0.4 0.5 0.4  PROT 5.5* 6.6 6.9 6.7 7.0  ALBUMIN 3.0* 3.4* 3.6 3.4* 3.4*   No results for input(s): LIPASE, AMYLASE in the last 168 hours. No results for input(s): AMMONIA in the last 168 hours. CBC: Recent Labs  Lab 03/10/21 0313 03/11/21 0413 03/12/21 0946 03/13/21 0350 03/14/21 0355  WBC 15.0* 11.6* 6.5 6.0 6.5  NEUTROABS 12.1* 9.5* 4.8 3.5 3.6  HGB 11.7* 12.0 12.2 11.9* 12.1  HCT 36.1 37.5 37.1 36.8 37.8  MCV 89.4 88.7 87.3 88.0 87.9  PLT 130* 130* 151 166 198   Cardiac Enzymes: No results for input(s): CKTOTAL, CKMB, CKMBINDEX, TROPONINI in the last 168 hours. BNP: Invalid input(s): POCBNP CBG: Recent Labs  Lab 03/12/21 0732 03/13/21 0852 03/13/21 1246 03/14/21 1019 03/14/21 1212  GLUCAP 120* 125* 152*  135* 128*   D-Dimer No results for input(s): DDIMER in the last 72 hours. Hgb A1c No results for input(s): HGBA1C in the last 72 hours. Lipid Profile No results for input(s): CHOL, HDL, LDLCALC, TRIG, CHOLHDL, LDLDIRECT in the last 72 hours. Thyroid function studies No results for input(s): TSH, T4TOTAL, T3FREE, THYROIDAB in the last 72 hours.  Invalid input(s): FREET3 Anemia work up No results for input(s): VITAMINB12, FOLATE, FERRITIN, TIBC, IRON, RETICCTPCT in the last 72 hours. Urinalysis    Component Value Date/Time   COLORURINE YELLOW 03/09/2021 1143   APPEARANCEUR HAZY (A) 03/09/2021 1143   LABSPEC 1.013 03/09/2021 1143   PHURINE 5.0 03/09/2021 1143   GLUCOSEU NEGATIVE 03/09/2021 1143   HGBUR MODERATE (A) 03/09/2021 1143   BILIRUBINUR NEGATIVE 03/09/2021 1143   KETONESUR NEGATIVE 03/09/2021 1143   PROTEINUR 30 (A) 03/09/2021 1143   UROBILINOGEN 0.2 12/09/2013 1556   NITRITE NEGATIVE 03/09/2021 1143   LEUKOCYTESUR MODERATE (A) 03/09/2021 1143   Sepsis Labs Invalid input(s): PROCALCITONIN,  WBC,  LACTICIDVEN Microbiology Recent Results (from the past 240 hour(s))  Urine Culture     Status: Abnormal   Collection Time:  03/09/21 11:43 AM   Specimen: In/Out Cath Urine  Result Value Ref Range Status   Specimen Description   Final    IN/OUT CATH URINE Performed at [facility], [place] [address], [place], [place] [address]    Special Requests   Final    NONE Performed at [facility], [place] [address] [place], [place] [address]    Culture (A)  Final    >=100,000 COLONIES/mL ESCHERICHIA COLI Confirmed Extended Spectrum Beta-Lactamase [name] (ESBL).  In bloodstream infections from ESBL organisms, carbapenems are preferred over piperacillin/tazobactam. They are shown to have a lower risk of mortality.    Report Status 03/11/2021 FINAL  Final   Organism ID, Bacteria ESCHERICHIA COLI (A)  Final      Susceptibility   Escherichia coli - MIC*    AMPICILLIN >=32 RESISTANT Resistant     CEFAZOLIN >=64 RESISTANT Resistant     CEFEPIME >=32 RESISTANT Resistant     CEFTRIAXONE >=64 RESISTANT Resistant     CIPROFLOXACIN >=4 RESISTANT Resistant     GENTAMICIN >=16 RESISTANT Resistant     IMIPENEM <=0.25 SENSITIVE Sensitive     NITROFURANTOIN <=16 SENSITIVE Sensitive     TRIMETH/SULFA >=320 RESISTANT Resistant     AMPICILLIN/SULBACTAM >=32 RESISTANT Resistant     PIP/TAZO 8 SENSITIVE Sensitive     * >=100,000 COLONIES/mL ESCHERICHIA COLI  Resp Panel by RT-PCR (Flu A&B, Covid) Nasopharyngeal Swab     Status: None   Collection Time: 03/09/21 12:09 PM   Specimen: Nasopharyngeal Swab; Nasopharyngeal(NP) swabs in vial transport medium  Result Value Ref Range Status   SARS Coronavirus 2 by RT PCR NEGATIVE NEGATIVE Final    Comment: (NOTE) SARS-CoV-2 target nucleic acids are NOT DETECTED.  The SARS-CoV-2 RNA is generally detectable in upper respiratory specimens during the acute phase of infection. The lowest concentration of SARS-CoV-2 viral copies this assay can detect is 138 copies/mL. A negative result does not preclude SARS-Cov-2 infection and should not be used as the sole  basis for treatment or other patient management decisions. A negative result may occur with  improper specimen collection/handling, submission of specimen other than nasopharyngeal swab, presence of viral mutation(s) within the areas targeted by this assay, and inadequate number of viral copies(<138 copies/mL). A negative result must be combined with clinical observations, patient history, and epidemiological  information. The expected result is Negative.  Fact Sheet for Patients:  EntrepreneurPulse.com.au  Fact Sheet for Healthcare Providers:  IncredibleEmployment.be  This test is no t yet approved or cleared by the [place] FDA and  has been authorized for detection and/or diagnosis of SARS-CoV-2 by FDA under an Emergency Use Authorization (EUA). This EUA will remain  in effect (meaning this test can be used) for the duration of the COVID-19 declaration under Section 564(b)(1) of the Act, 21 U.S.C.section 360bbb-3(b)(1), unless the authorization is terminated  or revoked sooner.       Influenza A by PCR NEGATIVE NEGATIVE Final   Influenza B by PCR NEGATIVE NEGATIVE Final    Comment: (NOTE) The Xpert Xpress SARS-CoV-2/FLU/RSV plus assay is intended as an aid in the diagnosis of influenza from Nasopharyngeal swab specimens and should not be used as a sole basis for treatment. Nasal washings and aspirates are unacceptable for Xpert Xpress SARS-CoV-2/FLU/RSV testing.  Fact Sheet for Patients: EntrepreneurPulse.com.au  Fact Sheet for Healthcare Providers: IncredibleEmployment.be  This test is not yet approved or cleared by the [place] FDA and has been authorized for detection and/or diagnosis of SARS-CoV-2 by FDA under an Emergency Use Authorization (EUA). This EUA will remain in effect (meaning this test can be used) for the duration of the COVID-19 declaration under Section 564(b)(1) of the Act,  21 U.S.C. section 360bbb-3(b)(1), unless the authorization is terminated or revoked.  Performed at [facility], [place] [address] [place], [place] [address]   Blood Culture (routine x 2)     Status: None   Collection Time: 03/09/21 12:09 PM   Specimen: BLOOD  Result Value Ref Range Status   Specimen Description   Final    BLOOD BLOOD RIGHT FOREARM Performed at [facility] [address], [place], [place] [address]    Special Requests   Final    BOTTLES DRAWN AEROBIC AND ANAEROBIC Blood Culture results may not be optimal due to an inadequate volume of blood received in culture bottles Performed at [facility] [address] [place], [place] [address]    Culture   Final    NO GROWTH 5 DAYS Performed at [facility], [place] [address] [place], [place] [address]    Report Status 03/14/2021 FINAL  Final  Blood Culture (routine x 2)     Status: None   Collection Time: 03/09/21 12:09 PM   Specimen: BLOOD RIGHT HAND  Result Value Ref Range Status   Specimen Description   Final    BLOOD RIGHT HAND Performed at [facility] [address] [place], [place] [address]    Special Requests   Final    BOTTLES DRAWN AEROBIC AND ANAEROBIC Blood Culture adequate volume Performed at [facility] [address] [place], [place] [address]    Culture   Final    NO GROWTH 5 DAYS Performed at [facility], [place] [address], [place], [place] [address]    Report Status 03/14/2021 FINAL  Final  Group A Strep by PCR     Status: None   Collection Time: 03/09/21  1:05 PM   Specimen: Throat; Sterile Swab  Result Value Ref Range Status   Group A Strep by PCR NOT DETECTED NOT DETECTED Final    Comment: Performed at [facility], [place] [address] [place], [place] [address]   Time coordinating discharge: 35 minutes  SIGNED:  Kerney Elbe, DO [facility] 03/14/2021, 1:24 PM Pager is on [email or web address]  If 7PM-7AM, please contact night-coverage [email or web address]

## 2021-03-14 NOTE — TOC Progression Note (Signed)
Transition of Care [facility]) - Progression Note    Patient Details  Name: Katie Lyons MRN: [id number] Date of Birth: 1985-10-30  [facility] CM/SW Contact  Purcell Mouton, RN Phone Number: 03/14/2021, 12:58 PM  Clinical Narrative:    Pt is setup with [facility] [phone]. Will need to send Med order and [place] to [phone].    Expected Discharge Plan: Home/Self Care Barriers to Discharge: Continued Medical Work up  Expected Discharge Plan and Services Expected Discharge Plan: Home/Self Care       Living arrangements for the past 2 months: Single Family Home Expected Discharge Date: 03/14/21                                     Social Determinants of Health (SDOH) Interventions    Readmission Risk Interventions No flowsheet data found.

## 2021-03-23 ENCOUNTER — Inpatient Hospital Stay: Payer: BLUE CROSS/BLUE SHIELD | Admitting: Internal Medicine

## 2021-04-26 DIAGNOSIS — A499 Bacterial infection, unspecified: Secondary | ICD-10-CM | POA: Insufficient documentation

## 2021-07-16 ENCOUNTER — Encounter (HOSPITAL_COMMUNITY): Payer: Self-pay | Admitting: Emergency Medicine

## 2021-07-16 ENCOUNTER — Inpatient Hospital Stay (HOSPITAL_COMMUNITY)
Admission: EM | Admit: 2021-07-16 | Discharge: 2021-07-21 | DRG: 673 | Disposition: A | Discharge status: Home Health | Payer: BLUE CROSS/BLUE SHIELD | Attending: Internal Medicine | Admitting: Internal Medicine

## 2021-07-16 ENCOUNTER — Emergency Department (HOSPITAL_COMMUNITY): Payer: BLUE CROSS/BLUE SHIELD

## 2021-07-16 ENCOUNTER — Other Ambulatory Visit: Payer: Self-pay

## 2021-07-16 DIAGNOSIS — A4151 Sepsis due to Escherichia coli [E. coli]: Secondary | ICD-10-CM | POA: Diagnosis present

## 2021-07-16 DIAGNOSIS — Z20822 Contact with and (suspected) exposure to covid-19: Secondary | ICD-10-CM | POA: Diagnosis present

## 2021-07-16 DIAGNOSIS — Y830 Surgical operation with transplant of whole organ as the cause of abnormal reaction of the patient, or of later complication, without mention of misadventure at the time of the procedure: Secondary | ICD-10-CM | POA: Diagnosis present

## 2021-07-16 DIAGNOSIS — D849 Immunodeficiency, unspecified: Secondary | ICD-10-CM

## 2021-07-16 DIAGNOSIS — Z940 Kidney transplant status: Secondary | ICD-10-CM

## 2021-07-16 DIAGNOSIS — I10 Essential (primary) hypertension: Secondary | ICD-10-CM | POA: Diagnosis present

## 2021-07-16 DIAGNOSIS — Z79621 Long term (current) use of calcineurin inhibitor: Secondary | ICD-10-CM | POA: Diagnosis not present

## 2021-07-16 DIAGNOSIS — Z87898 Personal history of other specified conditions: Secondary | ICD-10-CM

## 2021-07-16 DIAGNOSIS — Z7952 Long term (current) use of systemic steroids: Secondary | ICD-10-CM

## 2021-07-16 DIAGNOSIS — T8613 Kidney transplant infection: Secondary | ICD-10-CM | POA: Diagnosis present

## 2021-07-16 DIAGNOSIS — Z8616 Personal history of COVID-19: Secondary | ICD-10-CM

## 2021-07-16 DIAGNOSIS — R1031 Right lower quadrant pain: Secondary | ICD-10-CM | POA: Diagnosis not present

## 2021-07-16 DIAGNOSIS — R6520 Severe sepsis without septic shock: Secondary | ICD-10-CM | POA: Diagnosis not present

## 2021-07-16 DIAGNOSIS — A419 Sepsis, unspecified organism: Secondary | ICD-10-CM | POA: Diagnosis not present

## 2021-07-16 DIAGNOSIS — R7881 Bacteremia: Secondary | ICD-10-CM

## 2021-07-16 DIAGNOSIS — Z7960 Long term (current) use of unspecified immunomodulators and immunosuppressants: Secondary | ICD-10-CM

## 2021-07-16 DIAGNOSIS — N10 Acute tubulo-interstitial nephritis: Secondary | ICD-10-CM | POA: Diagnosis present

## 2021-07-16 DIAGNOSIS — Z79899 Other long term (current) drug therapy: Secondary | ICD-10-CM

## 2021-07-16 DIAGNOSIS — Z87440 Personal history of urinary (tract) infections: Secondary | ICD-10-CM

## 2021-07-16 DIAGNOSIS — N12 Tubulo-interstitial nephritis, not specified as acute or chronic: Secondary | ICD-10-CM

## 2021-07-16 DIAGNOSIS — Z87820 Personal history of traumatic brain injury: Secondary | ICD-10-CM | POA: Diagnosis not present

## 2021-07-16 DIAGNOSIS — D84821 Immunodeficiency due to drugs: Secondary | ICD-10-CM | POA: Diagnosis present

## 2021-07-16 DIAGNOSIS — A403 Sepsis due to Streptococcus pneumoniae: Secondary | ICD-10-CM | POA: Diagnosis not present

## 2021-07-16 DIAGNOSIS — R7989 Other specified abnormal findings of blood chemistry: Secondary | ICD-10-CM | POA: Diagnosis not present

## 2021-07-16 DIAGNOSIS — T8619 Other complication of kidney transplant: Secondary | ICD-10-CM | POA: Diagnosis not present

## 2021-07-16 DIAGNOSIS — I259 Chronic ischemic heart disease, unspecified: Secondary | ICD-10-CM | POA: Diagnosis present

## 2021-07-16 DIAGNOSIS — N390 Urinary tract infection, site not specified: Secondary | ICD-10-CM

## 2021-07-16 DIAGNOSIS — N179 Acute kidney failure, unspecified: Secondary | ICD-10-CM

## 2021-07-16 LAB — CBC WITH DIFFERENTIAL/PLATELET
Abs Immature Granulocytes: 0.08 10*3/uL — ABNORMAL HIGH (ref 0.00–0.07)
Basophils Absolute: 0 10*3/uL (ref 0.0–0.1)
Basophils Relative: 0 %
Eosinophils Absolute: 0 10*3/uL (ref 0.0–0.5)
Eosinophils Relative: 0 %
HCT: 38.6 % (ref 36.0–46.0)
Hemoglobin: 12.9 g/dL (ref 12.0–15.0)
Immature Granulocytes: 1 %
Lymphocytes Relative: 5 %
Lymphs Abs: 0.8 10*3/uL (ref 0.7–4.0)
MCH: 28.5 pg (ref 26.0–34.0)
MCHC: 33.4 g/dL (ref 30.0–36.0)
MCV: 85.2 fL (ref 80.0–100.0)
Monocytes Absolute: 0.1 10*3/uL (ref 0.1–1.0)
Monocytes Relative: 1 %
Neutro Abs: 15.3 10*3/uL — ABNORMAL HIGH (ref 1.7–7.7)
Neutrophils Relative %: 93 %
Platelets: 177 10*3/uL (ref 150–400)
RBC: 4.53 MIL/uL (ref 3.87–5.11)
RDW: 13.3 % (ref 11.5–15.5)
WBC: 16.2 10*3/uL — ABNORMAL HIGH (ref 4.0–10.5)
nRBC: 0 % (ref 0.0–0.2)

## 2021-07-16 LAB — URINALYSIS, ROUTINE W REFLEX MICROSCOPIC
Bilirubin Urine: NEGATIVE
Glucose, UA: NEGATIVE mg/dL
Hgb urine dipstick: NEGATIVE
Ketones, ur: NEGATIVE mg/dL
Nitrite: NEGATIVE
Protein, ur: NEGATIVE mg/dL
Specific Gravity, Urine: 1.009 (ref 1.005–1.030)
pH: 8 (ref 5.0–8.0)

## 2021-07-16 LAB — COMPREHENSIVE METABOLIC PANEL
ALT: 148 U/L — ABNORMAL HIGH (ref 0–44)
AST: 249 U/L — ABNORMAL HIGH (ref 15–41)
Albumin: 3.9 g/dL (ref 3.5–5.0)
Alkaline Phosphatase: 167 U/L — ABNORMAL HIGH (ref 38–126)
Anion gap: 9 (ref 5–15)
BUN: 19 mg/dL (ref 6–20)
CO2: 20 mmol/L — ABNORMAL LOW (ref 22–32)
Calcium: 9.3 mg/dL (ref 8.9–10.3)
Chloride: 106 mmol/L (ref 98–111)
Creatinine, Ser: 1.19 mg/dL — ABNORMAL HIGH (ref 0.44–1.00)
GFR, Estimated: >60 mL/min (ref >60)
Glucose, Bld: 115 mg/dL — ABNORMAL HIGH (ref 70–99)
Potassium: 3.4 mmol/L — ABNORMAL LOW (ref 3.5–5.1)
Sodium: 135 mmol/L (ref 135–145)
Total Bilirubin: 0.9 mg/dL (ref 0.3–1.2)
Total Protein: 6.9 g/dL (ref 6.5–8.1)

## 2021-07-16 LAB — RESP PANEL BY RT-PCR (FLU A&B, COVID) ARPGX2
Influenza A by PCR: NEGATIVE
Influenza B by PCR: NEGATIVE
SARS Coronavirus 2 by RT PCR: NEGATIVE

## 2021-07-16 LAB — PROTIME-INR
INR: 1.1 (ref 0.8–1.2)
Prothrombin Time: 13.7 seconds (ref 11.4–15.2)

## 2021-07-16 LAB — I-STAT BETA HCG BLOOD, ED (MC, WL, AP ONLY): I-stat hCG, quantitative: <5 m[IU]/mL (ref <5)

## 2021-07-16 LAB — LACTIC ACID, PLASMA
Lactic Acid, Venous: 2.9 mmol/L (ref 0.5–1.9)
Lactic Acid, Venous: 1.6 mmol/L (ref 0.5–1.9)

## 2021-07-16 MED ORDER — LACTATED RINGERS IV BOLUS: 1000.0000 mL | Freq: Once | INTRAVENOUS | Status: DC

## 2021-07-16 MED ORDER — LACTATED RINGERS IV SOLN: INTRAVENOUS | Status: AC

## 2021-07-16 MED ORDER — ACETAMINOPHEN 500 MG PO TABS
1000.0000 mg | ORAL_TABLET | Freq: Once | ORAL | Status: AC
  Administered 2021-07-16: 1000 mg via ORAL
  Filled 2021-07-16: qty 2

## 2021-07-16 MED ORDER — SODIUM CHLORIDE 0.9 % IV SOLN
1.0000 g | INTRAVENOUS | Status: AC
  Administered 2021-07-16: 1 g via INTRAVENOUS
  Filled 2021-07-16: qty 1

## 2021-07-16 MED ORDER — IOHEXOL 350 MG/ML SOLN
75.0000 mL | Freq: Once | INTRAVENOUS | Status: AC | PRN
  Administered 2021-07-16: 75 mL via INTRAVENOUS

## 2021-07-16 MED ORDER — ONDANSETRON HCL 4 MG/2ML IJ SOLN: 4.0000 mg | Freq: Once | INTRAMUSCULAR | Status: AC

## 2021-07-16 MED ORDER — ONDANSETRON HCL 4 MG/2ML IJ SOLN: 4.0000 mg | Freq: Four times a day (QID) | INTRAMUSCULAR | Status: DC | PRN

## 2021-07-16 MED ORDER — HYDROMORPHONE HCL 1 MG/ML IJ SOLN
1.0000 mg | Freq: Once | INTRAMUSCULAR | Status: AC
  Administered 2021-07-16: 1 mg via INTRAVENOUS
  Filled 2021-07-16: qty 1

## 2021-07-16 MED ORDER — SODIUM CHLORIDE 0.9 % IV SOLN
1.0000 g | Freq: Three times a day (TID) | INTRAVENOUS | Status: DC
  Administered 2021-07-17 – 2021-07-19 (×8): 1 g via INTRAVENOUS
  Filled 2021-07-16 (×10): qty 1

## 2021-07-16 MED ORDER — PIPERACILLIN-TAZOBACTAM 3.375 G IVPB 30 MIN: 3.3750 g | Freq: Once | INTRAVENOUS | Status: DC

## 2021-07-16 MED ORDER — ONDANSETRON HCL 4 MG/2ML IJ SOLN
INTRAMUSCULAR | Status: AC
  Administered 2021-07-16: 4 mg via INTRAVENOUS
  Filled 2021-07-16: qty 2

## 2021-07-16 MED ORDER — FENTANYL CITRATE PF 50 MCG/ML IJ SOSY
12.5000 ug | PREFILLED_SYRINGE | Freq: Once | INTRAMUSCULAR | Status: AC
  Administered 2021-07-17: 02:00:00 12.5 ug via INTRAVENOUS
  Filled 2021-07-16: qty 1

## 2021-07-16 MED ORDER — LACTATED RINGERS IV BOLUS (SEPSIS)
1000.0000 mL | Freq: Once | INTRAVENOUS | Status: AC
  Administered 2021-07-16: 1000 mL via INTRAVENOUS

## 2021-07-16 MED ORDER — SODIUM CHLORIDE 0.9 % IV SOLN
1.0000 g | Freq: Once | INTRAVENOUS | Status: DC
  Administered 2021-07-16: 1 g via INTRAVENOUS
  Filled 2021-07-16: qty 10

## 2021-07-16 NOTE — Progress Notes (Signed)
Pharmacy Antibiotic Note  Katie Lyons is a 35 y.o. female admitted on 07/16/2021 with sepsis, UTI.  PMH of kidney transplant on immunosuppressants and previous ESBL Ecoli pyelonephritis in July 2022.  Pharmacy has been consulted for meropenem dosing.  Plan: Meropenem 1g IV q8h  Follow up renal function, culture results, and clinical course.      Temp (24hrs), Avg:103.1 F (39.5 C), Min:103.1 F (39.5 C), Max:103.1 F (39.5 C)  Recent Labs  Lab 07/16/21 1720  WBC 16.2*  CREATININE 1.19*  LATICACIDVEN 2.9*    CrCl cannot be calculated (Unknown ideal weight.).    No Known Allergies  Antimicrobials this admission: 12/3 Meropenem >>   Dose adjustments this admission:   Microbiology results: 12/3 BCx: 12/3 UCx:   Thank you for allowing pharmacy to be a part of this patient's care.  Gretta Arab PharmD, BCPS Clinical Pharmacist [facility] main pharmacy [phone] 07/16/2021 6:50 PM

## 2021-07-16 NOTE — ED Provider Notes (Signed)
Emergency Medicine Provider Triage Evaluation Note  Katie Lyons , a 35 y.o. female  was evaluated in triage.  Pt complains of dysuria, fever, body aches, chills, with nausea. Patient reports hx of recurrent UTIs, as well as hx of kidney transplant. Patient reports pain is worst on the right side of pubis, as well as both flanks. Has taken immunosuppression medications as directed. Has not taken anything for fever, pain today. No hematuria.  Review of Systems  Positive: As above Negative: As above  Physical Exam  BP (!) 146/89   Pulse (!) 106   Temp (!) 103.1 F (39.5 C) (Oral)   Resp 19   SpO2 97%  Gen:   Awake, ill appearing, septic appearing Resp:  Poor effort MSK:   Moves extremities without difficulty  Other:    Medical Decision Making  Medically screening exam initiated at 5:19 PM.  Appropriate orders placed.  Katie Lyons was informed that the remainder of the evaluation will be completed by another provider, this initial triage assessment does not replace that evaluation, and the importance of remaining in the ED until their evaluation is complete.  Concern for urosepsis, triage nurse informed patient needs room ASAP   Katie Lyons 07/16/21 [place], [place], MD 07/16/21 [phone]

## 2021-07-16 NOTE — Sepsis Progress Note (Signed)
Sepsis protocol is being followed by [facility]. 

## 2021-07-16 NOTE — ED Triage Notes (Signed)
Pt reports body aches, chills, fever since this morning. Pt reports this has happened before when she gets UTI. Temp in triage 103.1
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
## 2021-07-16 NOTE — ED Notes (Signed)
Patient transported to CT 

## 2021-07-16 NOTE — H&P (Signed)
History and Physical    Katie Lyons ZDG:[id number] DOB: Apr 19, 1986 DOA: 07/16/2021  PCP: Latanya Maudlin, MD  Patient coming from: Home  I have personally briefly reviewed patient's old medical records in [place]  Chief Complaint: urinary symptom, fever  HPI: Katie Lyons is a 35 y.o. female with medical history significant for ESRD on HD since 2008 s/p renal transplant in 2019, hx of CMV, HTN, ICH, recurrent pyelonephritis with ESBL UTI who presents with fever and dysuria.   Virtual interpretation for Spanish was used for this evaluation. Symptoms started this morning with generalized body ache, fever, chills and dysuria. Still able to keep down her immunosuppressive this morning. Had nausea and vomiting x 1 in the ED. No diarrhea. Diffuse abdominal and back pain.    ED Course: She was febrile up to 103.4F, HR of 106, bp 146/89 with WBC of 16.2. Creatinine elevated at 1.19. AST of 249, ALT of 148, Alk phos of 167.  CT abdomen/pelvis showing pyelonephritis of the transplant kidney. She was started on IV meropenem. Hospitalist called for admission.   Review of Systems: Constitutional: No Weight Change, + Fever ENT/Mouth: No sore throat, No Rhinorrhea Eyes: No Vision Changes Cardiovascular: No Chest Pain, no SOB Respiratory: No Cough, No Sputum  Gastrointestinal: + Nausea, +Vomiting, No Diarrhea, No Constipation,+Pain Genitourinary: no Urinary Incontinence, + Urgency, +Flank Pain Musculoskeletal: No Arthralgias, No Myalgias Skin: No Skin Lesions, No Pruritus, Neuro: no Weakness, No Numbness Psych: No Anxiety/Panic, No Depression, no decrease appetite Heme/Lymph: No Bruising, No Bleeding  Past Medical History:  Diagnosis Date   Anxiety    Chronic ischemic heart disease    Depression    3-4 years ago per husband   End stage renal disease ([place])    Headache    Hypertension    Iron deficiency    Secondary hyperparathyroidism  ([place])    Traumatic brain injury     Past Surgical History:  Procedure Laterality Date   ARTERIOVENOUS GRAFT PLACEMENT     AV FISTULA PLACEMENT Left 04/13/2016   Procedure: UPPER EXTREMITY LEFT  ARTERIOVENOUS (AV) FISTULA CREATION;  Surgeon: Waynetta Sandy, MD;  Location: [facility];  Service: Vascular;  Laterality: Left;   [place] Right 04/10/2016   Procedure: REMOVAL OF Right Leg ARTERIOVENOUS GORETEX GRAFT ([place]);  Surgeon: Waynetta Sandy, MD;  Location: [facility];  Service: Vascular;  Laterality: Right;   INSERTION OF DIALYSIS CATHETER Right 04/10/2016   Procedure: insertion Dialysis catheter;  Surgeon: Waynetta Sandy, MD;  Location: [facility];  Service: Vascular;  Laterality: Right;   INSERTION OF DIALYSIS CATHETER Right 04/13/2016   Procedure: INSERTION OF DIALYSIS CATHETER RIGHT INTERNAL JUGULAR;  Surgeon: Waynetta Sandy, MD;  Location: [facility];  Service: Vascular;  Laterality: Right;   IR FLUORO GUIDE CV LINE RIGHT  03/11/2021   IR US GUIDE VASC ACCESS RIGHT  03/11/2021   Kidney Transplant x 2 months ago     NO PAST SURGERIES     REVISION OF ARTERIOVENOUS GORETEX GRAFT Right 01/18/2016   Procedure: REVISION OF ARTERIOVENOUS GORETEX GRAFT Right leg;  Surgeon: Rosetta Posner, MD;  Location: [facility];  Service: Vascular;  Laterality: Right;     reports that she has never smoked. She has never used smokeless tobacco. She reports that she does not drink alcohol and does not use drugs. Social History  No Known Allergies  History reviewed. No pertinent family history.   Prior to Admission medications   Medication Sig Start  Date End Date Taking? Authorizing Provider  acetaminophen (TYLENOL) 325 MG tablet Take 2 tablets (650 mg total) by mouth every 6 (six) hours as needed for fever. 03/14/21  Yes Sheikh, Omair Latif, DO  magnesium oxide (MAG-OX) 400 MG tablet Take 400 mg by mouth 2 (two) times daily.   Yes [provider]  mycophenolate (MYFORTIC) 180 MG EC  tablet Take 360 mg by mouth 2 (two) times daily. 10/09/18  Yes [provider]  predniSONE (DELTASONE) 5 MG tablet Take 1 tablet (5 mg total) by mouth daily. Resume after finishing steroid taper. 09/11/19  Yes Little Ishikawa, MD  sodium bicarbonate 650 MG tablet Take 2,600 mg by mouth 3 (three) times daily. Four tablets three times a day 05/20/18  Yes [provider]  sulfamethoxazole-trimethoprim (BACTRIM) 400-80 MG tablet Take 1 tablet by mouth every Monday, Wednesday, and Friday. 05/25/21  Yes [provider]  tacrolimus (PROGRAF) 1 MG capsule Take 6 mg by mouth 2 (two) times daily. 10/07/18  Yes [provider]  ondansetron (ZOFRAN) 4 MG tablet Take 1 tablet (4 mg total) by mouth every 6 (six) hours as needed for nausea. Patient not taking: Reported on 07/16/2021 03/14/21   Raiford Noble Latif, DO  polyethylene glycol (MIRALAX / GLYCOLAX) 17 g packet Take 17 g by mouth daily as needed for mild constipation. Patient not taking: Reported on 07/16/2021 03/14/21   Raiford Noble [place], DO    Physical Exam: Vitals:   07/16/21 2115 07/16/21 2119 07/16/21 2200 07/16/21 2302  BP: (!) 137/48  (!) 145/60 137/72  Pulse: (!) 104  98 (!) 104  Resp: (!) 23  (!) 24 (!) 28  Temp:  98.7 F (37.1 C)    TempSrc:  Oral    SpO2: 98%  98% 98%    Constitutional: NAD, calm, ill-appearing middle-aged female actively vomiting during exam Vitals:   07/16/21 2115 07/16/21 2119 07/16/21 2200 07/16/21 2302  BP: (!) 137/48  (!) 145/60 137/72  Pulse: (!) 104  98 (!) 104  Resp: (!) 23  (!) 24 (!) 28  Temp:  98.7 F (37.1 C)    TempSrc:  Oral    SpO2: 98%  98% 98%   Eyes: ids and conjunctivae normal ENMT: Mucous membranes are moist.  Neck: normal, supple Respiratory: clear to auscultation bilaterally, no wheezing, no crackles. Normal respiratory effort. No accessory muscle use.  Cardiovascular: Regular rate and rhythm, no murmurs / rubs / gallops. No extremity edema.    Abdomen: no tenderness, nondistended.  Bowel sounds positive.  Musculoskeletal: no clubbing / cyanosis. No joint deformity upper and lower extremities. Good ROM, no contractures. Normal muscle tone.  Skin: no rashes, lesions, ulcers. No induration Neurologic: CN 2-12 grossly intact. Strength 5/5 in all 4.  Psychiatric: Normal judgment and insight. Alert and oriented x 3. Normal mood.     Labs on Admission: I have personally reviewed following labs and imaging studies  CBC: Recent Labs  Lab 07/16/21 1720  WBC 16.2*  NEUTROABS 15.3*  HGB 12.9  HCT 38.6  MCV 85.2  PLT 299   Basic Metabolic Panel: Recent Labs  Lab 07/16/21 1720  NA 135  K 3.4*  CL 106  CO2 20*  GLUCOSE 115*  BUN 19  CREATININE 1.19*  CALCIUM 9.3   GFR: CrCl cannot be calculated (Unknown ideal weight.). Liver Function Tests: Recent Labs  Lab 07/16/21 1720  AST 249*  ALT 148*  ALKPHOS 167*  BILITOT 0.9  PROT 6.9  ALBUMIN 3.9  No results for input(s): LIPASE, AMYLASE in the last 168 hours. No results for input(s): AMMONIA in the last 168 hours. Coagulation Profile: Recent Labs  Lab 07/16/21 1720  INR 1.1   Cardiac Enzymes: No results for input(s): CKTOTAL, CKMB, CKMBINDEX, TROPONINI in the last 168 hours. BNP (last 3 results) No results for input(s): PROBNP in the last 8760 hours. HbA1C: No results for input(s): HGBA1C in the last 72 hours. CBG: No results for input(s): GLUCAP in the last 168 hours. Lipid Profile: No results for input(s): CHOL, HDL, LDLCALC, TRIG, CHOLHDL, LDLDIRECT in the last 72 hours. Thyroid Function Tests: No results for input(s): TSH, T4TOTAL, FREET4, T3FREE, THYROIDAB in the last 72 hours. Anemia Panel: No results for input(s): VITAMINB12, FOLATE, FERRITIN, TIBC, IRON, RETICCTPCT in the last 72 hours. Urine analysis:    Component Value Date/Time   COLORURINE STRAW (A) 07/16/2021 1903   APPEARANCEUR CLEAR 07/16/2021 1903   LABSPEC 1.009 07/16/2021 1903    PHURINE 8.0 07/16/2021 1903   GLUCOSEU NEGATIVE 07/16/2021 1903   HGBUR NEGATIVE 07/16/2021 1903   BILIRUBINUR NEGATIVE 07/16/2021 1903   KETONESUR NEGATIVE 07/16/2021 1903   PROTEINUR NEGATIVE 07/16/2021 1903   UROBILINOGEN 0.2 12/09/2013 1556   NITRITE NEGATIVE 07/16/2021 1903   LEUKOCYTESUR TRACE (A) 07/16/2021 1903    Radiological Exams on Admission: DG Chest 2 View  Result Date: 07/16/2021 CLINICAL DATA:  Body aches, chills, fever since this a.m. Chronic ischemic heart disease history. EXAM: CHEST - 2 VIEW COMPARISON:  March 09, 2021 FINDINGS: Similar cardiomegaly and pulmonary vascular engorgement. No overt pulmonary edema. No focal airspace consolidation. No visible pleural effusion or pneumothorax. The visualized skeletal structures are unremarkable. IMPRESSION: Similar cardiomegaly and pulmonary vascular congestion. No overt pulmonary edema or focal airspace consolidation. Electronically Signed   By: Dahlia Bailiff M.D.   On: 07/16/2021 17:46   CT ABDOMEN PELVIS W CONTRAST  Result Date: 07/16/2021 CLINICAL DATA:  Abdominal pain and fevers, history of prior renal transplant EXAM: CT ABDOMEN AND PELVIS WITH CONTRAST TECHNIQUE: Multidetector CT imaging of the abdomen and pelvis was performed using the standard protocol following bolus administration of intravenous contrast. CONTRAST:  10mL OMNIPAQUE IOHEXOL 350 MG/ML SOLN COMPARISON:  03/09/2021 FINDINGS: Lower chest: Lung bases demonstrate minimal dependent atelectatic changes. Hepatobiliary: No focal liver abnormality is seen. No gallstones, gallbladder wall thickening, or biliary dilatation. Pancreas: Unremarkable. No pancreatic ductal dilatation or surrounding inflammatory changes. Spleen: Normal in size without focal abnormality. Adrenals/Urinary Tract: Adrenal glands are within normal limits. Native kidneys are shrunken consistent with the given clinical history of end-stage renal disease. In the right lower quadrant, there remains a  renal transplant with some patchy areas of decreased enhancement identified particularly in the upper pole with increase in perinephric stranding when compared with the prior exam. These changes are suspicious for underlying pyelonephritis. No obstructive changes are seen. Bladder is partially distended. Stomach/Bowel: The appendix is within normal limits. No obstructive or inflammatory changes of the colon are noted. Small bowel and stomach are within normal limits. Vascular/Lymphatic: Aortic atherosclerosis. No enlarged abdominal or pelvic lymph nodes. Reproductive: Uterus and bilateral adnexa are unremarkable. Other: No abdominal wall hernia or abnormality. No abdominopelvic ascites. Musculoskeletal: No acute or significant osseous findings. IMPRESSION: Findings suspicious for pyelonephritis involving the renal transplant with areas of patchy enhancement identified. No obstructive changes are seen. Electronically Signed   By: Inez Catalina M.D.   On: 07/16/2021 21:35      Assessment/Plan  Sepsis secondary to acute pyelonephritis of transplanted kidney -  Patient presented with fever, tachycardia and leukocytosis with lactate of 2.9 -Patient has had recurrent pyelonephritis back in July and August.  Cultures grew ESBL E. coli in July -Continue IV meropenem pending urine culture  AKI History of ESRD s/p transplant kidney 2019 -Follows with [facility] transplant team -Continues IV LR fluid overnight -Will continue immunosuppressive with tacrolimus, mycophenolate, daily 5 mg prednisone -Continue sodium bicarb  Elevated LFTs AST of 249, ALT of 148, alkaline phosphatase of 167 in the setting of sepsis.  We will follow with morning CMP.  Obtain acute hepatitis panel. -Patient has history of chronically elevated transaminitis since her renal transplant in 2019 thought possibly related to CMV.  She has been documented to have seen GI with MRCP work-up without evidence of biliary ductal dilatation  or stone.  DVT prophylaxis:.Lovenox Code Status: Full Family Communication: Plan discussed with patient at bedside  disposition Plan: Home with at least 2 midnight stays  Consults called:  Admission status: inpatient Level of care: Telemetry  Status is: Inpatient  Remains inpatient appropriate because: Sepsis with pyelonephritis of transplanted kidney requiring aggressive IV fluid hydration IV antibiotics         Cindi Ghazarian T Takira Sherrin DO [facility]   If 7PM-7AM, please contact night-coverage [email or web address]   07/16/2021, 11:16 PM

## 2021-07-17 DIAGNOSIS — R6520 Severe sepsis without septic shock: Secondary | ICD-10-CM

## 2021-07-17 DIAGNOSIS — T8619 Other complication of kidney transplant: Secondary | ICD-10-CM | POA: Diagnosis present

## 2021-07-17 DIAGNOSIS — R7989 Other specified abnormal findings of blood chemistry: Secondary | ICD-10-CM

## 2021-07-17 DIAGNOSIS — A403 Sepsis due to Streptococcus pneumoniae: Secondary | ICD-10-CM

## 2021-07-17 DIAGNOSIS — N179 Acute kidney failure, unspecified: Secondary | ICD-10-CM

## 2021-07-17 LAB — COMPREHENSIVE METABOLIC PANEL
ALT: 103 U/L — ABNORMAL HIGH (ref 0–44)
AST: 69 U/L — ABNORMAL HIGH (ref 15–41)
Albumin: 3.2 g/dL — ABNORMAL LOW (ref 3.5–5.0)
Alkaline Phosphatase: 115 U/L (ref 38–126)
Anion gap: 4 — ABNORMAL LOW (ref 5–15)
BUN: 12 mg/dL (ref 6–20)
CO2: 23 mmol/L (ref 22–32)
Calcium: 8.3 mg/dL — ABNORMAL LOW (ref 8.9–10.3)
Chloride: 106 mmol/L (ref 98–111)
Creatinine, Ser: 0.91 mg/dL (ref 0.44–1.00)
GFR, Estimated: >60 mL/min (ref >60)
Glucose, Bld: 132 mg/dL — ABNORMAL HIGH (ref 70–99)
Potassium: 4 mmol/L (ref 3.5–5.1)
Sodium: 133 mmol/L — ABNORMAL LOW (ref 135–145)
Total Bilirubin: 0.9 mg/dL (ref 0.3–1.2)
Total Protein: 6 g/dL — ABNORMAL LOW (ref 6.5–8.1)

## 2021-07-17 LAB — BLOOD CULTURE ID PANEL (REFLEXED) - BCID2

## 2021-07-17 LAB — CBC
HCT: 36.7 % (ref 36.0–46.0)
Hemoglobin: 12.2 g/dL (ref 12.0–15.0)
MCH: 28.8 pg (ref 26.0–34.0)
MCHC: 33.2 g/dL (ref 30.0–36.0)
MCV: 86.6 fL (ref 80.0–100.0)
Platelets: 142 10*3/uL — ABNORMAL LOW (ref 150–400)
RBC: 4.24 MIL/uL (ref 3.87–5.11)
RDW: 13.5 % (ref 11.5–15.5)
WBC: 24.7 10*3/uL — ABNORMAL HIGH (ref 4.0–10.5)
nRBC: 0 % (ref 0.0–0.2)

## 2021-07-17 MED ORDER — MAGNESIUM OXIDE -MG SUPPLEMENT 400 (240 MG) MG PO TABS
400.0000 mg | ORAL_TABLET | Freq: Two times a day (BID) | ORAL | Status: DC
  Administered 2021-07-17 – 2021-07-21 (×10): 400 mg via ORAL
  Filled 2021-07-17 (×10): qty 1

## 2021-07-17 MED ORDER — SODIUM BICARBONATE 650 MG PO TABS
2600.0000 mg | ORAL_TABLET | Freq: Three times a day (TID) | ORAL | Status: DC
  Administered 2021-07-17 – 2021-07-21 (×13): 2600 mg via ORAL
  Filled 2021-07-17 (×8): qty 4
  Filled 2021-07-17: qty 2
  Filled 2021-07-17 (×5): qty 4

## 2021-07-17 MED ORDER — PREDNISONE 5 MG PO TABS
5.0000 mg | ORAL_TABLET | Freq: Every day | ORAL | Status: DC
  Administered 2021-07-17 – 2021-07-21 (×5): 5 mg via ORAL
  Filled 2021-07-17 (×5): qty 1

## 2021-07-17 MED ORDER — HYDROMORPHONE HCL 1 MG/ML IJ SOLN
1.0000 mg | INTRAMUSCULAR | Status: AC | PRN
  Administered 2021-07-17 (×2): 1 mg via INTRAVENOUS
  Filled 2021-07-17 (×2): qty 1

## 2021-07-17 MED ORDER — ENOXAPARIN SODIUM 40 MG/0.4ML IJ SOSY
40.0000 mg | PREFILLED_SYRINGE | Freq: Every day | INTRAMUSCULAR | Status: DC
  Filled 2021-07-17 (×2): qty 0.4

## 2021-07-17 MED ORDER — TACROLIMUS 1 MG PO CAPS
6.0000 mg | ORAL_CAPSULE | Freq: Two times a day (BID) | ORAL | Status: DC
  Administered 2021-07-17 – 2021-07-21 (×10): 6 mg via ORAL
  Filled 2021-07-17 (×12): qty 6

## 2021-07-17 MED ORDER — MYCOPHENOLATE SODIUM 180 MG PO TBEC
360.0000 mg | DELAYED_RELEASE_TABLET | Freq: Two times a day (BID) | ORAL | Status: DC
  Administered 2021-07-17 – 2021-07-18 (×4): 360 mg via ORAL
  Filled 2021-07-17 (×4): qty 2

## 2021-07-17 MED ORDER — ACETAMINOPHEN 325 MG PO TABS
650.0000 mg | ORAL_TABLET | Freq: Four times a day (QID) | ORAL | Status: DC | PRN
  Administered 2021-07-17: 18:00:00 650 mg via ORAL
  Filled 2021-07-17: qty 2

## 2021-07-17 NOTE — Progress Notes (Signed)
ANTICOAGULATION CONSULT NOTE - Follow Up Consult  Pharmacy Consult for Enoxaparin Indication: VTE prophylaxis  No Known Allergies  Patient Measurements: Weight: 59 kg (130 lb)   Labs: Recent Labs    07/16/21 1720 07/17/21 0515  HGB 12.9 12.2  HCT 38.6 36.7  PLT 177 142*  LABPROT 13.7  --   INR 1.1  --   CREATININE 1.19* 0.91   Estimated Creatinine Clearance: 66.1 mL/min (by C-G formula based on SCr of 0.91 mg/dL).   Assessment: PHARMACY CONSULT: Lovenox for VTE prophylaxis  Wt:  59 kg, BMI 26 Scr:  0.91, CrCl >30 ml/hr  CBC:  Hgb WNL, Plt down to 142   Plan:  Enoxaparin 40mg  SQ q24h Pharmacy to sign off.  Gretta Arab PharmD, BCPS Clinical Pharmacist [facility] main pharmacy [phone] 07/17/2021 2:51 PM

## 2021-07-17 NOTE — Progress Notes (Signed)
PHARMACY - PHYSICIAN COMMUNICATION CRITICAL VALUE ALERT - BLOOD CULTURE IDENTIFICATION (BCID)  Katie Lyons is an 35 y.o. female who presented to [facility] on 07/16/2021 with a chief complaint of urosepsis  Assessment:  4/4 BCx bottles growing E coli (suspect urinary source)  Name of physician (or Provider) Contacted: Kyung Bacca  Current antibiotics: meropenem  Changes to prescribed antibiotics recommended: normally would narrow to Rocephin, but since BCID panel only detects one ESBL gene and pt does have Hx ESBL E coli in urine, recommend continuing meropenem pending C&S finalized Recommendations accepted by provider  Results for orders placed or performed during the hospital encounter of 07/16/21  Blood Culture ID Panel (Reflexed) (Collected: 07/16/2021  5:25 PM)  Result Value Ref Range   Enterococcus faecalis NOT DETECTED NOT DETECTED   Enterococcus Faecium NOT DETECTED NOT DETECTED   Listeria monocytogenes NOT DETECTED NOT DETECTED   Staphylococcus species NOT DETECTED NOT DETECTED   Staphylococcus aureus (BCID) NOT DETECTED NOT DETECTED   Staphylococcus epidermidis NOT DETECTED NOT DETECTED   Staphylococcus lugdunensis NOT DETECTED NOT DETECTED   Streptococcus species NOT DETECTED NOT DETECTED   Streptococcus agalactiae NOT DETECTED NOT DETECTED   Streptococcus pneumoniae NOT DETECTED NOT DETECTED   Streptococcus pyogenes NOT DETECTED NOT DETECTED   A.calcoaceticus-baumannii NOT DETECTED NOT DETECTED   Bacteroides fragilis NOT DETECTED NOT DETECTED   Enterobacterales DETECTED (A) NOT DETECTED   Enterobacter cloacae complex NOT DETECTED NOT DETECTED   Escherichia coli DETECTED (A) NOT DETECTED   Klebsiella aerogenes NOT DETECTED NOT DETECTED   Klebsiella oxytoca NOT DETECTED NOT DETECTED   Klebsiella pneumoniae NOT DETECTED NOT DETECTED   Proteus species NOT DETECTED NOT DETECTED   Salmonella species NOT DETECTED NOT DETECTED   Serratia marcescens NOT DETECTED NOT  DETECTED   Haemophilus influenzae NOT DETECTED NOT DETECTED   Neisseria meningitidis NOT DETECTED NOT DETECTED   Pseudomonas aeruginosa NOT DETECTED NOT DETECTED   Stenotrophomonas maltophilia NOT DETECTED NOT DETECTED   Candida albicans NOT DETECTED NOT DETECTED   Candida auris NOT DETECTED NOT DETECTED   Candida glabrata NOT DETECTED NOT DETECTED   Candida krusei NOT DETECTED NOT DETECTED   Candida parapsilosis NOT DETECTED NOT DETECTED   Candida tropicalis NOT DETECTED NOT DETECTED   Cryptococcus neoformans/gattii NOT DETECTED NOT DETECTED   CTX-M ESBL NOT DETECTED NOT DETECTED   Carbapenem resistance IMP NOT DETECTED NOT DETECTED   Carbapenem resistance KPC NOT DETECTED NOT DETECTED   Carbapenem resistance NDM NOT DETECTED NOT DETECTED   Carbapenem resist OXA 48 LIKE NOT DETECTED NOT DETECTED   Carbapenem resistance VIM NOT DETECTED NOT DETECTED    Tredarius Cobern A 07/17/2021  10:46 AM

## 2021-07-17 NOTE — Progress Notes (Signed)
PROGRESS NOTE  Katie Lyons MWN:[id number] DOB: September 09, 1985 DOA: 07/16/2021 PCP: Latanya Maudlin, MD  HPI/Recap of past 52 hours: 35 year old female with medical history significant for end-stage renal disease on hemodialysis since 2008 status postrenal transplant in 2019, history of CMV, hypertension, ICH, recurrent pyelonephritis with ESBL UTI who presented to the emergency department from another hospital with fever of 103 and dysuria.  She was admitted for sepsis she was started on meropenem  Patient seen and examined at bedside virtual Spanish interpreter service was used for this evaluation.   complaining of headache.  She denies any history of migraine headache.  She also complains of mild right flank pain denies any chills or fever.  Or diarrhea  Assessment/Plan: Principal Problem:   Sepsis ([place]) Active Problems:   Kidney transplanted   Pyelonephritis   AKI (acute kidney injury) ([place])   Elevated LFTs  #1 sepsis secondary to pyelonephritis of transplanted kidney. Patient was admitted with fever and tachycardia with leukocytosis and elevated lactate of 2.9. She was started on IV meropenem pending urine culture. Patient has remained afebrile today.  Patient denies chills Blood culture preliminary showing gram-positive rods  AKI history of end-stage renal disease status posttransplant this [place] kidney in 2019 Patient is being followed her [facility] team She received the lactated Ringer solution IV infusion overnight We will continue immunosuppressive therapy with tacrolimus mycophenolate and prednisone as well as sodium bicarb Continue to monitor renal function  Elevated LFTs This may be related to sepsis in addition to her renal transplant Patient has been evaluated by GI for this work-up which did not show any evidence of biliary ductal dilatation or stone  Headache She denies prior history of migraines Prophylactic  treatment with Tylenol   Code Status: Full  Severity of Illness: The appropriate patient status for this patient is INPATIENT. Inpatient status is judged to be reasonable and necessary in order to provide the required intensity of service to ensure the patient's safety. The patient's presenting symptoms, physical exam findings, and initial radiographic and laboratory data in the context of their chronic comorbidities is felt to place them at high risk for further clinical deterioration. Furthermore, it is not anticipated that the patient will be medically stable for discharge from the hospital within 2 midnights of admission.   Sepsis with pyelonephritis and AKI of transplanted kidney requiring aggressive IV fluid hydration IV antibiotics * I certify that at the point of admission it is my clinical judgment that the patient will require inpatient hospital care spanning beyond 2 midnights from the point of admission due to high intensity of service, high risk for further deterioration and high frequency of surveillance required.*   Family Communication: None at bedside   Disposition Plan:   Status is: Inpatient   Dispo: The patient is from: Home              Anticipated d/c is to:               Anticipated d/c date is:               Patient currently not medically stable for discharge  Consultants: None  Procedures: None  Antimicrobials: Meropenem  DVT prophylaxis: Lovenox   Objective: Vitals:   07/17/21 0330 07/17/21 0400 07/17/21 0430 07/17/21 0630  BP: (!) 143/59 (!) 125/59 133/60 105/60  Pulse: 99 88 83 77  Resp: 20 (!) 21 (!) 27 17  Temp:      TempSrc:  SpO2: 93% 95% 95% 96%    Intake/Output Summary (Last 24 hours) at 07/17/2021 0929 Last data filed at 07/17/2021 8299 Gross per 24 hour  Intake 2300.38 ml  Output --  Net 2300.38 ml   There were no vitals filed for this visit. There is no height or weight on file to calculate BMI.  Exam:  General: 35 y.o.  year-old female well developed well nourished in no acute distress.  Alert and oriented x3. Cardiovascular: Regular rate and rhythm with no rubs or gallops.  No thyromegaly or JVD noted.   Respiratory: Clear to auscultation with no wheezes or rales. Good inspiratory effort. Abdomen: Soft nontender nondistended with normal bowel sounds x4 quadrants. Musculoskeletal: No lower extremity edema. 2/4 pulses in all 4 extremities. Skin: No ulcerative lesions noted or rashes, Psychiatry: Mood is appropriate for condition and setting Neurology:    Data Reviewed: CBC: Recent Labs  Lab 07/16/21 1720 07/17/21 0515  WBC 16.2* 24.7*  NEUTROABS 15.3*  --   HGB 12.9 12.2  HCT 38.6 36.7  MCV 85.2 86.6  PLT 177 371*   Basic Metabolic Panel: Recent Labs  Lab 07/16/21 1720 07/17/21 0515  NA 135 133*  K 3.4* 4.0  CL 106 106  CO2 20* 23  GLUCOSE 115* 132*  BUN 19 12  CREATININE 1.19* 0.91  CALCIUM 9.3 8.3*   GFR: CrCl cannot be calculated (Unknown ideal weight.). Liver Function Tests: Recent Labs  Lab 07/16/21 1720 07/17/21 0515  AST 249* 69*  ALT 148* 103*  ALKPHOS 167* 115  BILITOT 0.9 0.9  PROT 6.9 6.0*  ALBUMIN 3.9 3.2*   No results for input(s): LIPASE, AMYLASE in the last 168 hours. No results for input(s): AMMONIA in the last 168 hours. Coagulation Profile: Recent Labs  Lab 07/16/21 1720  INR 1.1   Cardiac Enzymes: No results for input(s): CKTOTAL, CKMB, CKMBINDEX, TROPONINI in the last 168 hours. BNP (last 3 results) No results for input(s): PROBNP in the last 8760 hours. HbA1C: No results for input(s): HGBA1C in the last 72 hours. CBG: No results for input(s): GLUCAP in the last 168 hours. Lipid Profile: No results for input(s): CHOL, HDL, LDLCALC, TRIG, CHOLHDL, LDLDIRECT in the last 72 hours. Thyroid Function Tests: No results for input(s): TSH, T4TOTAL, FREET4, T3FREE, THYROIDAB in the last 72 hours. Anemia Panel: No results for input(s): VITAMINB12,  FOLATE, FERRITIN, TIBC, IRON, RETICCTPCT in the last 72 hours. Urine analysis:    Component Value Date/Time   COLORURINE STRAW (A) 07/16/2021 1903   APPEARANCEUR CLEAR 07/16/2021 1903   LABSPEC 1.009 07/16/2021 1903   PHURINE 8.0 07/16/2021 1903   GLUCOSEU NEGATIVE 07/16/2021 1903   HGBUR NEGATIVE 07/16/2021 1903   BILIRUBINUR NEGATIVE 07/16/2021 1903   KETONESUR NEGATIVE 07/16/2021 1903   PROTEINUR NEGATIVE 07/16/2021 1903   UROBILINOGEN 0.2 12/09/2013 1556   NITRITE NEGATIVE 07/16/2021 1903   LEUKOCYTESUR TRACE (A) 07/16/2021 1903   Sepsis Labs: @LABRCNTIP (procalcitonin:4,lacticidven:4)  ) Recent Results (from the past 240 hour(s))  Culture, blood (Routine x 2)     Status: None (Preliminary result)   Collection Time: 07/16/21  5:20 PM   Specimen: BLOOD  Result Value Ref Range Status   Specimen Description   Final    BLOOD RIGHT WRIST Performed at [facility], [place] [address] [place], [place] [address]    Special Requests   Final    BOTTLES DRAWN AEROBIC AND ANAEROBIC Blood Culture adequate volume Performed at [facility] [address]  [place], [place] [address]    Culture   Final    NO GROWTH < 12 HOURS Performed at [facility] [address] [place], [place] [address]    Report Status PENDING  Incomplete  Culture, blood (Routine x 2)     Status: None (Preliminary result)   Collection Time: 07/16/21  5:25 PM   Specimen: BLOOD  Result Value Ref Range Status   Specimen Description   Final    BLOOD BLOOD RIGHT FOREARM Performed at [facility] [address] [place], [place] [address]    Special Requests   Final    BOTTLES DRAWN AEROBIC AND ANAEROBIC Blood Culture adequate volume Performed at [facility] [address], [place], [place] [address]    Culture  Setup Time   Final    GRAM NEGATIVE RODS ANAEROBIC BOTTLE ONLY Organism ID to follow Performed at [facility], [place] [address], [place],  [address]    Culture GRAM NEGATIVE RODS  Final   Report Status PENDING  Incomplete  Resp Panel by RT-PCR (Flu A&B, Covid) Nasopharyngeal Swab     Status: None   Collection Time: 07/16/21  5:45 PM   Specimen: Nasopharyngeal Swab; Nasopharyngeal(NP) swabs in vial transport medium  Result Value Ref Range Status   SARS Coronavirus 2 by RT PCR NEGATIVE NEGATIVE Final    Comment: (NOTE) SARS-CoV-2 target nucleic acids are NOT DETECTED.  The SARS-CoV-2 RNA is generally detectable in upper respiratory specimens during the acute phase of infection. The lowest concentration of SARS-CoV-2 viral copies this assay can detect is 138 copies/mL. A negative result does not preclude SARS-Cov-2 infection and should not be used as the sole basis for treatment or other patient management decisions. A negative result may occur with  improper specimen collection/handling, submission of specimen other than nasopharyngeal swab, presence of viral mutation(s) within the areas targeted by this assay, and inadequate number of viral copies(<138 copies/mL). A negative result must be combined with clinical observations, patient history, and epidemiological information. The expected result is Negative.  Fact Sheet for Patients:  EntrepreneurPulse.com.au  Fact Sheet for Healthcare Providers:  IncredibleEmployment.be  This test is no t yet approved or cleared by the [place] FDA and  has been authorized for detection and/or diagnosis of SARS-CoV-2 by FDA under an Emergency Use Authorization (EUA). This EUA will remain  in effect (meaning this test can be used) for the duration of the COVID-19 declaration under Section 564(b)(1) of the Act, 21 U.S.C.section 360bbb-3(b)(1), unless the authorization is terminated  or revoked sooner.       Influenza A by PCR NEGATIVE NEGATIVE Final   Influenza B by PCR NEGATIVE NEGATIVE Final     Comment: (NOTE) The Xpert Xpress SARS-CoV-2/FLU/RSV plus assay is intended as an aid in the diagnosis of influenza from Nasopharyngeal swab specimens and should not be used as a sole basis for treatment. Nasal washings and aspirates are unacceptable for Xpert Xpress SARS-CoV-2/FLU/RSV testing.  Fact Sheet for Patients: EntrepreneurPulse.com.au  Fact Sheet for Healthcare Providers: IncredibleEmployment.be  This test is not yet approved or cleared by the [place] FDA and has been authorized for detection and/or diagnosis of SARS-CoV-2 by FDA under an Emergency Use Authorization (EUA). This EUA will remain in effect (meaning this test can be used) for the duration of the COVID-19 declaration under Section 564(b)(1) of the Act, 21 U.S.C. section 360bbb-3(b)(1), unless the authorization is terminated or revoked.  Performed at [facility], [place] [address], [place], [place] [address]       Studies: DG Chest 2 View  Result Date: 07/16/2021 CLINICAL DATA:  Body aches, chills, fever since this a.m. Chronic ischemic heart disease history. EXAM: CHEST - 2 VIEW COMPARISON:  March 09, 2021 FINDINGS: Similar cardiomegaly and pulmonary vascular engorgement. No overt pulmonary edema. No focal airspace consolidation. No visible pleural effusion or pneumothorax. The visualized skeletal structures are unremarkable. IMPRESSION: Similar cardiomegaly and pulmonary vascular congestion. No overt pulmonary edema or focal airspace consolidation. Electronically Signed   By: Dahlia Bailiff M.D.   On: 07/16/2021 17:46   CT ABDOMEN PELVIS W CONTRAST  Result Date: 07/16/2021 CLINICAL DATA:  Abdominal pain and fevers, history of prior renal transplant EXAM: CT ABDOMEN AND PELVIS WITH CONTRAST TECHNIQUE: Multidetector CT imaging of the abdomen and pelvis was performed using the standard protocol following bolus administration of intravenous contrast.  CONTRAST:  25mL OMNIPAQUE IOHEXOL 350 MG/ML SOLN COMPARISON:  03/09/2021 FINDINGS: Lower chest: Lung bases demonstrate minimal dependent atelectatic changes. Hepatobiliary: No focal liver abnormality is seen. No gallstones, gallbladder wall thickening, or biliary dilatation. Pancreas: Unremarkable. No pancreatic ductal dilatation or surrounding inflammatory changes. Spleen: Normal in size without focal abnormality. Adrenals/Urinary Tract: Adrenal glands are within normal limits. Native kidneys are shrunken consistent with the given clinical history of end-stage renal disease. In the right lower quadrant, there remains a renal transplant with some patchy areas of decreased enhancement identified particularly in the upper pole with increase in perinephric stranding when compared with the prior exam. These changes are suspicious for underlying pyelonephritis. No obstructive changes are seen. Bladder is partially distended. Stomach/Bowel: The appendix is within normal limits. No obstructive or inflammatory changes of the colon are noted. Small bowel and stomach are within normal limits. Vascular/Lymphatic: Aortic atherosclerosis. No enlarged abdominal or pelvic lymph nodes. Reproductive: Uterus and bilateral adnexa are unremarkable. Other: No abdominal wall hernia or abnormality. No abdominopelvic ascites. Musculoskeletal: No acute or significant osseous findings. IMPRESSION: Findings suspicious for pyelonephritis involving the renal transplant with areas of patchy enhancement identified. No obstructive changes are seen. Electronically Signed   By: Inez Catalina M.D.   On: 07/16/2021 21:35    Scheduled Meds:  magnesium oxide  400 mg Oral BID   mycophenolate  360 mg Oral BID   predniSONE  5 mg Oral Daily   sodium bicarbonate  2,600 mg Oral TID   tacrolimus  6 mg Oral BID    Continuous Infusions:  lactated ringers 150 mL/hr at 07/17/21 0520   meropenem (MERREM) IV Stopped (07/17/21 0552)     LOS: 1 day      Cristal Deer, MD [facility]  To reach me or the doctor on call, go to: [email or web address] Password [email or web address]  07/17/2021, 9:29 AM

## 2021-07-17 NOTE — Progress Notes (Signed)
Contact PPE ordered for history of ESBL, per protocol. SRP, RN

## 2021-07-18 ENCOUNTER — Inpatient Hospital Stay (HOSPITAL_COMMUNITY): Payer: BLUE CROSS/BLUE SHIELD

## 2021-07-18 DIAGNOSIS — D849 Immunodeficiency, unspecified: Secondary | ICD-10-CM | POA: Diagnosis not present

## 2021-07-18 DIAGNOSIS — R7881 Bacteremia: Secondary | ICD-10-CM

## 2021-07-18 DIAGNOSIS — T8619 Other complication of kidney transplant: Secondary | ICD-10-CM

## 2021-07-18 DIAGNOSIS — N12 Tubulo-interstitial nephritis, not specified as acute or chronic: Secondary | ICD-10-CM | POA: Diagnosis not present

## 2021-07-18 LAB — CBC WITH DIFFERENTIAL/PLATELET
Abs Immature Granulocytes: 0.1 10*3/uL — ABNORMAL HIGH (ref 0.00–0.07)
Basophils Absolute: 0 10*3/uL (ref 0.0–0.1)
Basophils Relative: 0 %
Eosinophils Absolute: 0.2 10*3/uL (ref 0.0–0.5)
Eosinophils Relative: 1 %
HCT: 34.8 % — ABNORMAL LOW (ref 36.0–46.0)
Hemoglobin: 11.5 g/dL — ABNORMAL LOW (ref 12.0–15.0)
Immature Granulocytes: 1 %
Lymphocytes Relative: 10 %
Lymphs Abs: 1.6 10*3/uL (ref 0.7–4.0)
MCH: 28.5 pg (ref 26.0–34.0)
MCHC: 33 g/dL (ref 30.0–36.0)
MCV: 86.4 fL (ref 80.0–100.0)
Monocytes Absolute: 1.1 10*3/uL — ABNORMAL HIGH (ref 0.1–1.0)
Monocytes Relative: 7 %
Neutro Abs: 13 10*3/uL — ABNORMAL HIGH (ref 1.7–7.7)
Neutrophils Relative %: 81 %
Platelets: 135 10*3/uL — ABNORMAL LOW (ref 150–400)
RBC: 4.03 MIL/uL (ref 3.87–5.11)
RDW: 13.6 % (ref 11.5–15.5)
WBC: 15.9 10*3/uL — ABNORMAL HIGH (ref 4.0–10.5)
nRBC: 0 % (ref 0.0–0.2)

## 2021-07-18 LAB — URINE CULTURE: Culture: 2000 — AB

## 2021-07-18 MED ORDER — SODIUM CHLORIDE 0.9 % IV SOLN: INTRAVENOUS | Status: DC

## 2021-07-18 MED ORDER — TRAMADOL HCL 50 MG PO TABS
50.0000 mg | ORAL_TABLET | Freq: Once | ORAL | Status: AC
  Administered 2021-07-18: 50 mg via ORAL
  Filled 2021-07-18: qty 1

## 2021-07-18 MED ORDER — MYCOPHENOLATE SODIUM 180 MG PO TBEC
180.0000 mg | DELAYED_RELEASE_TABLET | Freq: Two times a day (BID) | ORAL | Status: DC
  Administered 2021-07-18 – 2021-07-20 (×4): 180 mg via ORAL
  Filled 2021-07-18 (×4): qty 1

## 2021-07-18 MED ORDER — ACETAMINOPHEN 325 MG PO TABS
650.0000 mg | ORAL_TABLET | Freq: Four times a day (QID) | ORAL | Status: DC | PRN
  Administered 2021-07-18 – 2021-07-19 (×2): 650 mg via ORAL
  Filled 2021-07-18 (×2): qty 2

## 2021-07-18 NOTE — Consult Note (Signed)
[facility]                                                                                        Patient Identification: Patient Name: Katie Lyons MRN: [id number] [place] Date: 07/16/2021  4:47 PM Today's Date: 07/18/2021 Reason for consult: E. coli bacteremia Requesting provider: Gerlean Ren Chirag   Principal Problem:   Sepsis [facility]) Active Problems:   Kidney transplanted   Pyelonephritis   AKI (acute kidney injury) ([place])   Elevated LFTs   Pyelonephritis of transplanted kidney   Antibiotics: Ceftriaxone 12/3                    Meropenem 12/3-current  Lines/Hardware: Left arm AV fistula  Assessment E coli bacteremia: Likely source is urinary but surprisingly E coli did not grow in urine cultures  Recurrent Pyelonephritis of Rt sided transplanted Kidney - Prior h/o Pyelonephritis treated in June (IV abtx-> Ciprofloxacin, 14 days course) and s/p tx in July-August 2022, ESBL E coli( Ertapenem,  14 days course )  Urine cx with 2000 colonies Group B strep  H/o Strep sanguinis bacteremia DDRT to the right pelvis on 04/15/18- On Mycophenolate, tacrolimus and prednisone.   Recommendations  Continue Meropenem pending sensitivities of E coli given prior ESBL Urinary colonization  Greatly appreciate primary reaching out to her transplant team at [place] of the management as per the primary team. Please call with questions or concerns.  Thank you for the consult  Rosiland Oz, MD Infectious Disease Physician [facility] for Infectious Disease [address] [place], [place] [address] Phone: [phone]  Fax: [phone]  __________________________________________________________________________________________________________ HPI and Hospital Course: 35 year old female with PMH of ESRD on hemodialysis followed by renal  transplant in 2019, history of CMV, hypertension, ICH, recurrent pyelonephritis with ESBL who presented to the ED on 12/3 with fever, chills, nausea and vomiting, and dysuria since Saturday. Spoke to patient with the help of video spanish interpreter.   At ED, febrile with T-max 103.1.  WBC 16.2 with left shift.  LA 2.9 Labs remarkable for k 3.4, AKI with creatinine 1.19, AST and ALT elevated to 249 and 148.  ALP 167 UA with 0-5 WBC.  Urine cultures 2,000 colonies group B strep Blood culture 12/3 both sets E. Coli.  BC ID with no resistance markers identified CT abdomen pelvis findings as below  Denies back pain and flank pain. Nausea initially present but resolved. Lives with her husband and children. Denies smoking, alcohol and IVDU.   ROS: General- Denies loss of appetite and loss of weight, Fevers and chills + HEENT - Denies headache, blurry vision, neck pain, sinus pain Chest - Denies any chest pain, SOB or cough CVS- Denies any dizziness/lightheadedness, syncopal attacks, palpitations Abdomen- Denies any vomiting, abdominal pain, hematochezia and diarrhea, Nausea + Neuro - Denies any weakness, numbness, tingling sensation Psych - Denies any changes in mood irritability or depressive symptoms GU- Denies any hematuria,  burning, dysuria, increased frequency of urination+ Skin - denies any rashes/lesions MSK - denies any joint pain/swelling or restricted ROM  Past Medical History:  Diagnosis Date   Anxiety    Chronic ischemic heart disease    Depression    3-4 years ago per husband   End stage renal disease ([place])    Headache    Hypertension    Iron deficiency    Secondary hyperparathyroidism ([place])    Traumatic brain injury    Past Surgical History:  Procedure Laterality Date   ARTERIOVENOUS GRAFT PLACEMENT     AV FISTULA PLACEMENT Left 04/13/2016   Procedure: UPPER EXTREMITY LEFT  ARTERIOVENOUS (AV) FISTULA CREATION;  Surgeon: Waynetta Sandy, MD;  Location: [place];   Service: Vascular;  Laterality: Left;   [place] Right 04/10/2016   Procedure: REMOVAL OF Right Leg ARTERIOVENOUS GORETEX GRAFT ([place]);  Surgeon: Waynetta Sandy, MD;  Location: [place];  Service: Vascular;  Laterality: Right;   INSERTION OF DIALYSIS CATHETER Right 04/10/2016   Procedure: insertion Dialysis catheter;  Surgeon: Waynetta Sandy, MD;  Location: [place];  Service: Vascular;  Laterality: Right;   INSERTION OF DIALYSIS CATHETER Right 04/13/2016   Procedure: INSERTION OF DIALYSIS CATHETER RIGHT INTERNAL JUGULAR;  Surgeon: Waynetta Sandy, MD;  Location: [place];  Service: Vascular;  Laterality: Right;   IR FLUORO GUIDE CV LINE RIGHT  03/11/2021   IR US GUIDE VASC ACCESS RIGHT  03/11/2021   Kidney Transplant x 2 months ago     NO PAST SURGERIES     REVISION OF ARTERIOVENOUS GORETEX GRAFT Right 01/18/2016   Procedure: REVISION OF ARTERIOVENOUS GORETEX GRAFT Right leg;  Surgeon: Rosetta Posner, MD;  Location: [facility] OR;  Service: Vascular;  Laterality: Right;     Scheduled Meds:  enoxaparin (LOVENOX) injection  40 mg Subcutaneous Daily   magnesium oxide  400 mg Oral BID   mycophenolate  360 mg Oral BID   predniSONE  5 mg Oral Daily   sodium bicarbonate  2,600 mg Oral TID   tacrolimus  6 mg Oral BID   Continuous Infusions:  meropenem (MERREM) IV 1 g (07/18/21 0502)   PRN Meds:.acetaminophen, ondansetron (ZOFRAN) IV  No Known Allergies  Social History   Socioeconomic History   Marital status: Married    Spouse name: Not on file   Number of children: Not on file   Years of education: Not on file   Highest education level: Not on file  Occupational History   Not on file  Tobacco Use   Smoking status: Never   Smokeless tobacco: Never  Substance and Sexual Activity   Alcohol use: No   Drug use: No   Sexual activity: Yes    Birth control/protection: Condom  Other Topics Concern   Not on file  Social History Narrative   Not on file   Social  Determinants of Health   Financial Resource Strain: Not on file  Food Insecurity: Not on file  Transportation Needs: Not on file  Physical Activity: Not on file  Stress: Not on file  Social Connections: Not on file  Intimate Partner Violence: Not on file   History reviewed. No pertinent family history.   Vitals BP (!) 150/71 (BP Location: Right Leg)   Pulse 78   Temp 98.5 F (36.9 C) (Oral)   Resp 19   Ht 5' (1.524 m)   Wt 59 kg   LMP 06/29/2021   SpO2 100%   BMI 25.39 kg/m '   Physical Exam Constitutional:  sitting up in chair and appears comfortable     Comments:  Cardiovascular:     Rate and Rhythm: Normal rate and regular rhythm.     Heart sounds:   Pulmonary:     Effort: Pulmonary effort is normal.     Comments: clear lung fields bilaterally   Abdominal:     Palpations: Abdomen is soft.     Tenderness: Non tender and non distended, transplanted kidney site has well healed scar   Musculoskeletal:        General: No swelling or tenderness.   Skin:    Comments: No lesions or rashes   Neurological:     General: Grossly non focal, awake, alert and oriented   Psychiatric:        Mood and Affect: Mood normal.    Pertinent Microbiology Results for orders placed or performed during the hospital encounter of 07/16/21  Culture, blood (Routine x 2)     Status: None (Preliminary result)   Collection Time: 07/16/21  5:20 PM   Specimen: BLOOD  Result Value Ref Range Status   Specimen Description   Final    BLOOD RIGHT WRIST Performed at [facility] [address] [place], [place] [address]    Special Requests   Final    BOTTLES DRAWN AEROBIC AND ANAEROBIC Blood Culture adequate volume Performed at [facility] [address], [place], [place] [address]    Culture  Setup Time   Final    GRAM NEGATIVE RODS IN BOTH AEROBIC AND ANAEROBIC BOTTLES CRITICAL VALUE NOTED.  VALUE IS CONSISTENT WITH PREVIOUSLY REPORTED AND  CALLED VALUE.    Culture   Final    GRAM NEGATIVE RODS IDENTIFICATION TO FOLLOW Performed at [facility], [place] [address] [place], [place] [address]    Report Status PENDING  Incomplete  Culture, blood (Routine x 2)     Status: Abnormal (Preliminary result)   Collection Time: 07/16/21  5:25 PM   Specimen: BLOOD  Result Value Ref Range Status   Specimen Description   Final    BLOOD BLOOD RIGHT FOREARM Performed at [facility] [address], [place], [place] [address]    Special Requests   Final    BOTTLES DRAWN AEROBIC AND ANAEROBIC Blood Culture adequate volume Performed at [facility] [address] [place], [place] [address]    Culture  Setup Time   Final    GRAM NEGATIVE RODS IN BOTH AEROBIC AND ANAEROBIC BOTTLES CRITICAL RESULT CALLED TO, READ BACK BY AND VERIFIED WITH: PHARM D D.WOFFORD ON [id number] AT 0630 BY E.PARRISH    Culture (A)  Final    ESCHERICHIA COLI SUSCEPTIBILITIES TO FOLLOW Performed at [facility], [place] [address] [place], [place] [address]    Report Status PENDING  Incomplete  Blood Culture ID Panel (Reflexed)     Status: Abnormal   Collection Time: 07/16/21  5:25 PM  Result Value Ref Range Status   Enterococcus faecalis NOT DETECTED NOT DETECTED Final   Enterococcus Faecium NOT DETECTED NOT DETECTED Final   Listeria monocytogenes NOT DETECTED NOT DETECTED Final   Staphylococcus species NOT DETECTED NOT DETECTED Final   Staphylococcus aureus (BCID) NOT DETECTED NOT DETECTED Final   Staphylococcus epidermidis NOT DETECTED NOT DETECTED Final   Staphylococcus lugdunensis NOT DETECTED NOT DETECTED Final   Streptococcus species NOT DETECTED NOT DETECTED Final   Streptococcus agalactiae NOT DETECTED NOT DETECTED Final   Streptococcus pneumoniae NOT DETECTED NOT DETECTED Final   Streptococcus pyogenes NOT DETECTED NOT DETECTED Final  A.calcoaceticus-baumannii NOT DETECTED NOT DETECTED Final    Bacteroides fragilis NOT DETECTED NOT DETECTED Final   Enterobacterales DETECTED (A) NOT DETECTED Final    Comment: Enterobacterales represent a large order of gram negative bacteria, not a single organism. CRITICAL RESULT CALLED TO, READ BACK BY AND VERIFIED WITH: PHARM D D.WOFFORD ON [id number] AT 4628 BY E.PARRISH    Enterobacter cloacae complex NOT DETECTED NOT DETECTED Final   Escherichia coli DETECTED (A) NOT DETECTED Final    Comment: CRITICAL RESULT CALLED TO, READ BACK BY AND VERIFIED WITH: PHARM D D.WOFFORD ON [id number] AT 1032 BY E.PARRISH    Klebsiella aerogenes NOT DETECTED NOT DETECTED Final   Klebsiella oxytoca NOT DETECTED NOT DETECTED Final   Klebsiella pneumoniae NOT DETECTED NOT DETECTED Final   Proteus species NOT DETECTED NOT DETECTED Final   Salmonella species NOT DETECTED NOT DETECTED Final   Serratia marcescens NOT DETECTED NOT DETECTED Final   Haemophilus influenzae NOT DETECTED NOT DETECTED Final   Neisseria meningitidis NOT DETECTED NOT DETECTED Final   Pseudomonas aeruginosa NOT DETECTED NOT DETECTED Final   Stenotrophomonas maltophilia NOT DETECTED NOT DETECTED Final   Candida albicans NOT DETECTED NOT DETECTED Final   Candida auris NOT DETECTED NOT DETECTED Final   Candida glabrata NOT DETECTED NOT DETECTED Final   Candida krusei NOT DETECTED NOT DETECTED Final   Candida parapsilosis NOT DETECTED NOT DETECTED Final   Candida tropicalis NOT DETECTED NOT DETECTED Final   Cryptococcus neoformans/gattii NOT DETECTED NOT DETECTED Final   CTX-M ESBL NOT DETECTED NOT DETECTED Final   Carbapenem resistance IMP NOT DETECTED NOT DETECTED Final   Carbapenem resistance KPC NOT DETECTED NOT DETECTED Final   Carbapenem resistance NDM NOT DETECTED NOT DETECTED Final   Carbapenem resist OXA 48 LIKE NOT DETECTED NOT DETECTED Final   Carbapenem resistance VIM NOT DETECTED NOT DETECTED Final    Comment: Performed at [facility], [address], [place], [place]  [address]  Resp Panel by RT-PCR (Flu A&B, Covid) Nasopharyngeal Swab     Status: None   Collection Time: 07/16/21  5:45 PM   Specimen: Nasopharyngeal Swab; Nasopharyngeal(NP) swabs in vial transport medium  Result Value Ref Range Status   SARS Coronavirus 2 by RT PCR NEGATIVE NEGATIVE Final    Comment: (NOTE) SARS-CoV-2 target nucleic acids are NOT DETECTED.  The SARS-CoV-2 RNA is generally detectable in upper respiratory specimens during the acute phase of infection. The lowest concentration of SARS-CoV-2 viral copies this assay can detect is 138 copies/mL. A negative result does not preclude SARS-Cov-2 infection and should not be used as the sole basis for treatment or other patient management decisions. A negative result may occur with  improper specimen collection/handling, submission of specimen other than nasopharyngeal swab, presence of viral mutation(s) within the areas targeted by this assay, and inadequate number of viral copies(<138 copies/mL). A negative result must be combined with clinical observations, patient history, and epidemiological information. The expected result is Negative.  Fact Sheet for Patients:  EntrepreneurPulse.com.au  Fact Sheet for Healthcare Providers:  IncredibleEmployment.be  This test is no t yet approved or cleared by the [place] FDA and  has been authorized for detection and/or diagnosis of SARS-CoV-2 by FDA under an Emergency Use Authorization (EUA). This EUA will remain  in effect (meaning this test can be used) for the duration of the COVID-19 declaration under Section 564(b)(1) of the Act, 21 U.S.C.section 360bbb-3(b)(1), unless the authorization is terminated  or revoked sooner.  Influenza A by PCR NEGATIVE NEGATIVE Final   Influenza B by PCR NEGATIVE NEGATIVE Final    Comment: (NOTE) The Xpert Xpress SARS-CoV-2/FLU/RSV plus assay is intended as an aid in the diagnosis of influenza from  Nasopharyngeal swab specimens and should not be used as a sole basis for treatment. Nasal washings and aspirates are unacceptable for Xpert Xpress SARS-CoV-2/FLU/RSV testing.  Fact Sheet for Patients: EntrepreneurPulse.com.au  Fact Sheet for Healthcare Providers: IncredibleEmployment.be  This test is not yet approved or cleared by the [place] FDA and has been authorized for detection and/or diagnosis of SARS-CoV-2 by FDA under an Emergency Use Authorization (EUA). This EUA will remain in effect (meaning this test can be used) for the duration of the COVID-19 declaration under Section 564(b)(1) of the Act, 21 U.S.C. section 360bbb-3(b)(1), unless the authorization is terminated or revoked.  Performed at [facility], [place] [address], [place], [place] [address]   Urine Culture     Status: Abnormal   Collection Time: 07/16/21  7:03 PM   Specimen: Urine, Clean Catch  Result Value Ref Range Status   Specimen Description   Final    URINE, CLEAN CATCH Performed at [facility], [place] [address], [place], [place] [address]    Special Requests   Final    NONE Performed at [facility], [place] [address] [place], [place] [address]    Culture (A)  Final    2,000 COLONIES/mL GROUP B STREP(S.AGALACTIAE)ISOLATED TESTING AGAINST S. AGALACTIAE NOT ROUTINELY PERFORMED DUE TO PREDICTABILITY OF AMP/PEN/VAN SUSCEPTIBILITY. Performed at [facility], [place] [address] [place],  [address]    Report Status 07/18/2021 FINAL  Final    Pertinent Lab seen by me: CBC Latest Ref Rng & Units 07/18/2021 07/17/2021 07/16/2021  WBC 4.0 - 10.5 K/uL 15.9(H) 24.7(H) 16.2(H)  Hemoglobin 12.0 - 15.0 g/dL 11.5(L) 12.2 12.9  Hematocrit 36.0 - 46.0 % 34.8(L) 36.7 38.6  Platelets 150 - 400 K/uL 135(L) 142(L) 177   CMP Latest Ref Rng & Units 07/17/2021 07/16/2021 03/14/2021  Glucose 70 - 99 mg/dL 132(H) 115(H)  115(H)  BUN 6 - 20 mg/dL 12 19 13   Creatinine 0.44 - 1.00 mg/dL 0.91 1.19(H) 0.70  Sodium 135 - 145 mmol/L 133(L) 135 138  Potassium 3.5 - 5.1 mmol/L 4.0 3.4(L) 4.0  Chloride 98 - 111 mmol/L 106 106 106  CO2 22 - 32 mmol/L 23 20(L) 24  Calcium 8.9 - 10.3 mg/dL 8.3(L) 9.3 9.6  Total Protein 6.5 - 8.1 g/dL 6.0(L) 6.9 7.0  Total Bilirubin 0.3 - 1.2 mg/dL 0.9 0.9 0.4  Alkaline Phos 38 - 126 U/L 115 167(H) 156(H)  AST 15 - 41 U/L 69(H) 249(H) 21  ALT 0 - 44 U/L 103(H) 148(H) 47(H)    Pertinent Imagings/Other Imagings Plain films and CT images have been personally visualized and interpreted; radiology reports have been reviewed. Decision making incorporated into the Impression / Recommendations.  DG Chest 2 View  Result Date: 07/16/2021 CLINICAL DATA:  Body aches, chills, fever since this a.m. Chronic ischemic heart disease history. EXAM: CHEST - 2 VIEW COMPARISON:  March 09, 2021 FINDINGS: Similar cardiomegaly and pulmonary vascular engorgement. No overt pulmonary edema. No focal airspace consolidation. No visible pleural effusion or pneumothorax. The visualized skeletal structures are unremarkable. IMPRESSION: Similar cardiomegaly and pulmonary vascular congestion. No overt pulmonary edema or focal airspace consolidation. Electronically Signed   By: Dahlia Bailiff M.D.   On: 07/16/2021 17:46   CT ABDOMEN PELVIS  W CONTRAST  Result Date: 07/16/2021 CLINICAL DATA:  Abdominal pain and fevers, history of prior renal transplant EXAM: CT ABDOMEN AND PELVIS WITH CONTRAST TECHNIQUE: Multidetector CT imaging of the abdomen and pelvis was performed using the standard protocol following bolus administration of intravenous contrast. CONTRAST:  62mL OMNIPAQUE IOHEXOL 350 MG/ML SOLN COMPARISON:  03/09/2021 FINDINGS: Lower chest: Lung bases demonstrate minimal dependent atelectatic changes. Hepatobiliary: No focal liver abnormality is seen. No gallstones, gallbladder wall thickening, or biliary dilatation.  Pancreas: Unremarkable. No pancreatic ductal dilatation or surrounding inflammatory changes. Spleen: Normal in size without focal abnormality. Adrenals/Urinary Tract: Adrenal glands are within normal limits. Native kidneys are shrunken consistent with the given clinical history of end-stage renal disease. In the right lower quadrant, there remains a renal transplant with some patchy areas of decreased enhancement identified particularly in the upper pole with increase in perinephric stranding when compared with the prior exam. These changes are suspicious for underlying pyelonephritis. No obstructive changes are seen. Bladder is partially distended. Stomach/Bowel: The appendix is within normal limits. No obstructive or inflammatory changes of the colon are noted. Small bowel and stomach are within normal limits. Vascular/Lymphatic: Aortic atherosclerosis. No enlarged abdominal or pelvic lymph nodes. Reproductive: Uterus and bilateral adnexa are unremarkable. Other: No abdominal wall hernia or abnormality. No abdominopelvic ascites. Musculoskeletal: No acute or significant osseous findings. IMPRESSION: Findings suspicious for pyelonephritis involving the renal transplant with areas of patchy enhancement identified. No obstructive changes are seen. Electronically Signed   By: Inez Catalina M.D.   On: 07/16/2021 21:35   ' I spent more than 70  minutes for this patient encounter including review of prior medical records/discussing diagnostics and treatment plan with the patient/family/coordinate care with primary/other specialits with greater than 50% of time in face to face encounter.   Electronically signed by:   Rosiland Oz, MD Infectious Disease Physician [facility] for Infectious Disease Pager: [phone]

## 2021-07-18 NOTE — Progress Notes (Addendum)
PROGRESS NOTE    Katie Lyons  HBZ:[id number] DOB: 1986-01-25 DOA: 07/16/2021 PCP: Latanya Maudlin, MD   Brief Narrative:  35 year old female with history of ESRD on HD since 2008 and a right pelvic renal transplant in 2019 with history of CMV and recurrent pyelonephritis with ESBL, HTN, ICH presented with fever and dysuria.  She is currently on immunosuppressive therapy, CT of the abdomen pelvis showed right-sided pyelonephritis, on empirically on IV meropenem.  ID team has been consulted.  I also notified patient's transplant team.   Assessment & Plan:   Principal Problem:   Sepsis ([place]) Active Problems:   Kidney transplanted   Pyelonephritis   AKI (acute kidney injury) ([place])   Elevated LFTs   Pyelonephritis of transplanted kidney   Sepsis secondary to right-sided acute pyelonephritis of the transplanted kidney Gram-negative rod bacteremia - UA does not appear to be very infected but patient is immunosuppressed and CT scan is showing findings of right-sided pyelonephritis.  Currently on empiric IV meropenem.  In-house infectious team has been consulted - Patient's primary transplant team has been notified by me who will reach back out to me as necessary.    ADDENDUM AT 1PM: Spoke with Dr Linward Foster from [facility]al transplant team who recommends-reducing Myfortic 180 mg twice daily, await further sensitivity data, continue meropenem, obtain renal ultrasound with Dopplers.  He will reach out to me again tomorrow and we will rediscuss further plan as necessary.  Greatly appreciate his input.  History of ESRD status post right-sided renal transplant 2019 - Follows with outpatient [facility] transplant team. - Currently immunosuppressed on tacrolimus, CellCept and prednisone  Transaminitis - Continue to monitor this for now.    DVT prophylaxis: Lovenox Code Status: Full code Family Communication:    Status is: Inpatient  Remains  inpatient appropriate because: Maintain hospital stay for IV antibiotics, patient is immunosuppressed.       Nutritional status           Body mass index is 25.39 kg/m.           Subjective: Seen and examined at bedside.  Translator used.  Translator ID [id number]  Patient denies having any complaints, overall feels okay.  Remains afebrile overnight.  Review of Systems Otherwise negative except as per HPI, including: General: Denies fever, chills, night sweats or unintended weight loss. Resp: Denies cough, wheezing, shortness of breath. Cardiac: Denies chest pain, palpitations, orthopnea, paroxysmal nocturnal dyspnea. GI: Denies abdominal pain, nausea, vomiting, diarrhea or constipation GU: Denies dysuria, frequency, hesitancy or incontinence MS: Denies muscle aches, joint pain or swelling Neuro: Denies headache, neurologic deficits (focal weakness, numbness, tingling), abnormal gait Psych: Denies anxiety, depression, SI/HI/AVH Skin: Denies new rashes or lesions ID: Denies sick contacts, exotic exposures, travel  Examination:  General exam: Appears calm and comfortable  Respiratory system: Clear to auscultation. Respiratory effort normal. Cardiovascular system: S1 & S2 heard, RRR. No JVD, murmurs, rubs, gallops or clicks. No pedal edema. Gastrointestinal system: Abdomen is nondistended, soft and nontender. No organomegaly or masses felt. Normal bowel sounds heard. Central nervous system: Alert and oriented. No focal neurological deficits. Extremities: Symmetric 5 x 5 power. Skin: No rashes, lesions or ulcers Psychiatry: Judgement and insight appear normal. Mood & affect appropriate.     Objective: Vitals:   07/17/21 1836 07/17/21 2136 07/18/21 0138 07/18/21 0544  BP: 138/66 134/61 132/67 (!) 150/71  Pulse: 66 72 64 78  Resp: 18 19 18 19   Temp:  98.2 F (36.8 C)  98.4 F (36.9 C) 98.5 F (36.9 C)  TempSrc:   Oral Oral  SpO2:  100% 100% 100%  Weight: 59  kg     Height: 5' (1.524 m)       Intake/Output Summary (Last 24 hours) at 07/18/2021 0921 Last data filed at 07/17/2021 2200 Gross per 24 hour  Intake 1240 ml  Output --  Net 1240 ml   Filed Weights   07/17/21 1448 07/17/21 1836  Weight: 59 kg 59 kg     Data Reviewed:   CBC: Recent Labs  Lab 07/16/21 1720 07/17/21 0515 07/18/21 0403  WBC 16.2* 24.7* 15.9*  NEUTROABS 15.3*  --  13.0*  HGB 12.9 12.2 11.5*  HCT 38.6 36.7 34.8*  MCV 85.2 86.6 86.4  PLT 177 142* 951*   Basic Metabolic Panel: Recent Labs  Lab 07/16/21 1720 07/17/21 0515  NA 135 133*  K 3.4* 4.0  CL 106 106  CO2 20* 23  GLUCOSE 115* 132*  BUN 19 12  CREATININE 1.19* 0.91  CALCIUM 9.3 8.3*   GFR: Estimated Creatinine Clearance: 68 mL/min (by C-G formula based on SCr of 0.91 mg/dL). Liver Function Tests: Recent Labs  Lab 07/16/21 1720 07/17/21 0515  AST 249* 69*  ALT 148* 103*  ALKPHOS 167* 115  BILITOT 0.9 0.9  PROT 6.9 6.0*  ALBUMIN 3.9 3.2*   No results for input(s): LIPASE, AMYLASE in the last 168 hours. No results for input(s): AMMONIA in the last 168 hours. Coagulation Profile: Recent Labs  Lab 07/16/21 1720  INR 1.1   Cardiac Enzymes: No results for input(s): CKTOTAL, CKMB, CKMBINDEX, TROPONINI in the last 168 hours. BNP (last 3 results) No results for input(s): PROBNP in the last 8760 hours. HbA1C: No results for input(s): HGBA1C in the last 72 hours. CBG: No results for input(s): GLUCAP in the last 168 hours. Lipid Profile: No results for input(s): CHOL, HDL, LDLCALC, TRIG, CHOLHDL, LDLDIRECT in the last 72 hours. Thyroid Function Tests: No results for input(s): TSH, T4TOTAL, FREET4, T3FREE, THYROIDAB in the last 72 hours. Anemia Panel: No results for input(s): VITAMINB12, FOLATE, FERRITIN, TIBC, IRON, RETICCTPCT in the last 72 hours. Sepsis Labs: Recent Labs  Lab 07/16/21 1720 07/16/21 2050  LATICACIDVEN 2.9* 1.6    Recent Results (from the past 240 hour(s))   Culture, blood (Routine x 2)     Status: None (Preliminary result)   Collection Time: 07/16/21  5:20 PM   Specimen: BLOOD  Result Value Ref Range Status   Specimen Description   Final    BLOOD RIGHT WRIST Performed at [facility] [address], [place], [place] [address]    Special Requests   Final    BOTTLES DRAWN AEROBIC AND ANAEROBIC Blood Culture adequate volume Performed at [facility] [address], [place], [place] [address]    Culture  Setup Time   Final    GRAM NEGATIVE RODS IN BOTH AEROBIC AND ANAEROBIC BOTTLES CRITICAL VALUE NOTED.  VALUE IS CONSISTENT WITH PREVIOUSLY REPORTED AND CALLED VALUE. Performed at [facility], [place] [address], [place], [place] [address]    Culture GRAM NEGATIVE RODS  Final   Report Status PENDING  Incomplete  Culture, blood (Routine x 2)     Status: None (Preliminary result)   Collection Time: 07/16/21  5:25 PM   Specimen: BLOOD  Result Value Ref Range Status   Specimen Description   Final    BLOOD BLOOD RIGHT FOREARM Performed at [facility], [place] [address], [place], [place] [address]    Special Requests   Final    BOTTLES DRAWN AEROBIC AND ANAEROBIC Blood Culture adequate volume Performed at [facility] [address], [place], [place] [address]    Culture  Setup Time   Final    GRAM NEGATIVE RODS IN BOTH AEROBIC AND ANAEROBIC BOTTLES CRITICAL RESULT CALLED TO, READ BACK BY AND VERIFIED WITH: Vena Austria D.WOFFORD ON [id number] AT 0539 BY E.PARRISH Performed at [facility], [place] [address] [place], [place] [address]    Culture GRAM NEGATIVE RODS  Final   Report Status PENDING  Incomplete  Blood Culture ID Panel (Reflexed)     Status: Abnormal   Collection Time: 07/16/21  5:25 PM  Result Value Ref Range Status   Enterococcus faecalis NOT DETECTED NOT DETECTED Final   Enterococcus Faecium NOT DETECTED NOT DETECTED Final   Listeria  monocytogenes NOT DETECTED NOT DETECTED Final   Staphylococcus species NOT DETECTED NOT DETECTED Final   Staphylococcus aureus (BCID) NOT DETECTED NOT DETECTED Final   Staphylococcus epidermidis NOT DETECTED NOT DETECTED Final   Staphylococcus lugdunensis NOT DETECTED NOT DETECTED Final   Streptococcus species NOT DETECTED NOT DETECTED Final   Streptococcus agalactiae NOT DETECTED NOT DETECTED Final   Streptococcus pneumoniae NOT DETECTED NOT DETECTED Final   Streptococcus pyogenes NOT DETECTED NOT DETECTED Final   A.calcoaceticus-baumannii NOT DETECTED NOT DETECTED Final   Bacteroides fragilis NOT DETECTED NOT DETECTED Final   Enterobacterales DETECTED (A) NOT DETECTED Final    Comment: Enterobacterales represent a large order of gram negative bacteria, not a single organism. CRITICAL RESULT CALLED TO, READ BACK BY AND VERIFIED WITH: PHARM D D.WOFFORD ON [id number] AT 0973 BY E.PARRISH    Enterobacter cloacae complex NOT DETECTED NOT DETECTED Final   Escherichia coli DETECTED (A) NOT DETECTED Final    Comment: CRITICAL RESULT CALLED TO, READ BACK BY AND VERIFIED WITH: PHARM D D.WOFFORD ON [id number] AT 1032 BY E.PARRISH    Klebsiella aerogenes NOT DETECTED NOT DETECTED Final   Klebsiella oxytoca NOT DETECTED NOT DETECTED Final   Klebsiella pneumoniae NOT DETECTED NOT DETECTED Final   Proteus species NOT DETECTED NOT DETECTED Final   Salmonella species NOT DETECTED NOT DETECTED Final   Serratia marcescens NOT DETECTED NOT DETECTED Final   Haemophilus influenzae NOT DETECTED NOT DETECTED Final   Neisseria meningitidis NOT DETECTED NOT DETECTED Final   Pseudomonas aeruginosa NOT DETECTED NOT DETECTED Final   Stenotrophomonas maltophilia NOT DETECTED NOT DETECTED Final   Candida albicans NOT DETECTED NOT DETECTED Final   Candida auris NOT DETECTED NOT DETECTED Final   Candida glabrata NOT DETECTED NOT DETECTED Final   Candida krusei NOT DETECTED NOT DETECTED Final   Candida parapsilosis  NOT DETECTED NOT DETECTED Final   Candida tropicalis NOT DETECTED NOT DETECTED Final   Cryptococcus neoformans/gattii NOT DETECTED NOT DETECTED Final   CTX-M ESBL NOT DETECTED NOT DETECTED Final   Carbapenem resistance IMP NOT DETECTED NOT DETECTED Final   Carbapenem resistance KPC NOT DETECTED NOT DETECTED Final   Carbapenem resistance NDM NOT DETECTED NOT DETECTED Final   Carbapenem resist OXA 48 LIKE NOT DETECTED NOT DETECTED Final   Carbapenem resistance VIM NOT DETECTED NOT DETECTED Final    Comment: Performed at [facility], [address] [place], [place] [address]  Resp Panel by RT-PCR (Flu A&B, Covid) Nasopharyngeal Swab     Status: None   Collection Time: 07/16/21  5:45 PM  Specimen: Nasopharyngeal Swab; Nasopharyngeal(NP) swabs in vial transport medium  Result Value Ref Range Status   SARS Coronavirus 2 by RT PCR NEGATIVE NEGATIVE Final    Comment: (NOTE) SARS-CoV-2 target nucleic acids are NOT DETECTED.  The SARS-CoV-2 RNA is generally detectable in upper respiratory specimens during the acute phase of infection. The lowest concentration of SARS-CoV-2 viral copies this assay can detect is 138 copies/mL. A negative result does not preclude SARS-Cov-2 infection and should not be used as the sole basis for treatment or other patient management decisions. A negative result may occur with  improper specimen collection/handling, submission of specimen other than nasopharyngeal swab, presence of viral mutation(s) within the areas targeted by this assay, and inadequate number of viral copies(<138 copies/mL). A negative result must be combined with clinical observations, patient history, and epidemiological information. The expected result is Negative.  Fact Sheet for Patients:  EntrepreneurPulse.com.au  Fact Sheet for Healthcare Providers:  IncredibleEmployment.be  This test is no t yet approved or cleared by the [place] FDA  and  has been authorized for detection and/or diagnosis of SARS-CoV-2 by FDA under an Emergency Use Authorization (EUA). This EUA will remain  in effect (meaning this test can be used) for the duration of the COVID-19 declaration under Section 564(b)(1) of the Act, 21 U.S.C.section 360bbb-3(b)(1), unless the authorization is terminated  or revoked sooner.       Influenza A by PCR NEGATIVE NEGATIVE Final   Influenza B by PCR NEGATIVE NEGATIVE Final    Comment: (NOTE) The Xpert Xpress SARS-CoV-2/FLU/RSV plus assay is intended as an aid in the diagnosis of influenza from Nasopharyngeal swab specimens and should not be used as a sole basis for treatment. Nasal washings and aspirates are unacceptable for Xpert Xpress SARS-CoV-2/FLU/RSV testing.  Fact Sheet for Patients: EntrepreneurPulse.com.au  Fact Sheet for Healthcare Providers: IncredibleEmployment.be  This test is not yet approved or cleared by the [place] FDA and has been authorized for detection and/or diagnosis of SARS-CoV-2 by FDA under an Emergency Use Authorization (EUA). This EUA will remain in effect (meaning this test can be used) for the duration of the COVID-19 declaration under Section 564(b)(1) of the Act, 21 U.S.C. section 360bbb-3(b)(1), unless the authorization is terminated or revoked.  Performed at [facility], [place] [address] [place], [place] [address]   Urine Culture     Status: None (Preliminary result)   Collection Time: 07/16/21  7:03 PM   Specimen: Urine, Clean Catch  Result Value Ref Range Status   Specimen Description   Final    URINE, CLEAN CATCH Performed at [facility], [place] [address] [place], [place] [address]    Special Requests   Final    NONE Performed at [facility], [place] [address], [place], [place] [address]    Culture   Final    CULTURE REINCUBATED FOR BETTER GROWTH Performed at  [facility], [place] [address] [place], [place] [address]    Report Status PENDING  Incomplete         Radiology Studies: DG Chest 2 View  Result Date: 07/16/2021 CLINICAL DATA:  Body aches, chills, fever since this a.m. Chronic ischemic heart disease history. EXAM: CHEST - 2 VIEW COMPARISON:  March 09, 2021 FINDINGS: Similar cardiomegaly and pulmonary vascular engorgement. No overt pulmonary edema. No focal airspace consolidation. No visible pleural effusion or pneumothorax. The visualized skeletal structures are unremarkable. IMPRESSION: Similar cardiomegaly and pulmonary vascular congestion. No overt pulmonary edema  or focal airspace consolidation. Electronically Signed   By: Dahlia Bailiff M.D.   On: 07/16/2021 17:46   CT ABDOMEN PELVIS W CONTRAST  Result Date: 07/16/2021 CLINICAL DATA:  Abdominal pain and fevers, history of prior renal transplant EXAM: CT ABDOMEN AND PELVIS WITH CONTRAST TECHNIQUE: Multidetector CT imaging of the abdomen and pelvis was performed using the standard protocol following bolus administration of intravenous contrast. CONTRAST:  21mL OMNIPAQUE IOHEXOL 350 MG/ML SOLN COMPARISON:  03/09/2021 FINDINGS: Lower chest: Lung bases demonstrate minimal dependent atelectatic changes. Hepatobiliary: No focal liver abnormality is seen. No gallstones, gallbladder wall thickening, or biliary dilatation. Pancreas: Unremarkable. No pancreatic ductal dilatation or surrounding inflammatory changes. Spleen: Normal in size without focal abnormality. Adrenals/Urinary Tract: Adrenal glands are within normal limits. Native kidneys are shrunken consistent with the given clinical history of end-stage renal disease. In the right lower quadrant, there remains a renal transplant with some patchy areas of decreased enhancement identified particularly in the upper pole with increase in perinephric stranding when compared with the prior exam. These changes are suspicious for underlying  pyelonephritis. No obstructive changes are seen. Bladder is partially distended. Stomach/Bowel: The appendix is within normal limits. No obstructive or inflammatory changes of the colon are noted. Small bowel and stomach are within normal limits. Vascular/Lymphatic: Aortic atherosclerosis. No enlarged abdominal or pelvic lymph nodes. Reproductive: Uterus and bilateral adnexa are unremarkable. Other: No abdominal wall hernia or abnormality. No abdominopelvic ascites. Musculoskeletal: No acute or significant osseous findings. IMPRESSION: Findings suspicious for pyelonephritis involving the renal transplant with areas of patchy enhancement identified. No obstructive changes are seen. Electronically Signed   By: Inez Catalina M.D.   On: 07/16/2021 21:35        Scheduled Meds:  enoxaparin (LOVENOX) injection  40 mg Subcutaneous Daily   magnesium oxide  400 mg Oral BID   mycophenolate  360 mg Oral BID   predniSONE  5 mg Oral Daily   sodium bicarbonate  2,600 mg Oral TID   tacrolimus  6 mg Oral BID   Continuous Infusions:  meropenem (MERREM) IV 1 g (07/18/21 0502)     LOS: 2 days   Time spent= 35 mins    Marrah Vanevery Arsenio Loader, MD [facility]  If 7PM-7AM, please contact night-coverage  07/18/2021, 9:21 AM

## 2021-07-19 DIAGNOSIS — T8619 Other complication of kidney transplant: Secondary | ICD-10-CM | POA: Diagnosis not present

## 2021-07-19 DIAGNOSIS — R7881 Bacteremia: Secondary | ICD-10-CM | POA: Diagnosis not present

## 2021-07-19 DIAGNOSIS — N12 Tubulo-interstitial nephritis, not specified as acute or chronic: Secondary | ICD-10-CM | POA: Diagnosis not present

## 2021-07-19 LAB — BASIC METABOLIC PANEL
Anion gap: 4 — ABNORMAL LOW (ref 5–15)
BUN: 10 mg/dL (ref 6–20)
CO2: 19 mmol/L — ABNORMAL LOW (ref 22–32)
Calcium: 8.7 mg/dL — ABNORMAL LOW (ref 8.9–10.3)
Chloride: 111 mmol/L (ref 98–111)
Creatinine, Ser: 0.76 mg/dL (ref 0.44–1.00)
GFR, Estimated: >60 mL/min (ref >60)
Glucose, Bld: 124 mg/dL — ABNORMAL HIGH (ref 70–99)
Potassium: 4.1 mmol/L (ref 3.5–5.1)
Sodium: 134 mmol/L — ABNORMAL LOW (ref 135–145)

## 2021-07-19 LAB — CBC
HCT: 34.4 % — ABNORMAL LOW (ref 36.0–46.0)
Hemoglobin: 11.5 g/dL — ABNORMAL LOW (ref 12.0–15.0)
MCH: 28.8 pg (ref 26.0–34.0)
MCHC: 33.4 g/dL (ref 30.0–36.0)
MCV: 86 fL (ref 80.0–100.0)
Platelets: 130 10*3/uL — ABNORMAL LOW (ref 150–400)
RBC: 4 MIL/uL (ref 3.87–5.11)
RDW: 13.3 % (ref 11.5–15.5)
WBC: 11.4 10*3/uL — ABNORMAL HIGH (ref 4.0–10.5)
nRBC: 0 % (ref 0.0–0.2)

## 2021-07-19 LAB — CULTURE, BLOOD (ROUTINE X 2)
Special Requests: ADEQUATE
Special Requests: ADEQUATE

## 2021-07-19 LAB — MAGNESIUM: Magnesium: 1.4 mg/dL — ABNORMAL LOW (ref 1.7–2.4)

## 2021-07-19 MED ORDER — SODIUM CHLORIDE 0.9 % IV SOLN
2.0000 g | INTRAVENOUS | Status: DC
  Administered 2021-07-19 – 2021-07-20 (×2): 2 g via INTRAVENOUS
  Filled 2021-07-19 (×3): qty 20

## 2021-07-19 MED ORDER — MAGNESIUM SULFATE 4 GM/100ML IV SOLN
4.0000 g | Freq: Once | INTRAVENOUS | Status: AC
  Administered 2021-07-19: 4 g via INTRAVENOUS
  Filled 2021-07-19: qty 100

## 2021-07-19 MED ORDER — SODIUM CHLORIDE 0.9 % IV SOLN: INTRAVENOUS | Status: AC

## 2021-07-19 NOTE — Progress Notes (Signed)
Pharmacy Antibiotic Note  Katie Lyons is a 35 y.o. female admitted on 07/16/2021 with sepsis, UTI.  PMH of kidney transplant on immunosuppressants and previous ESBL Ecoli pyelonephritis in July 2022.  Pharmacy has been consulted for meropenem dosing.  Plan: Continue Meropenem 1g IV q8h pending sensitivities of E Coli given prior ESBL urinary colonization Follow up renal function, culture results, and clinical course.   Height: 5' (152.4 cm) Weight: 59 kg (130 lb) IBW/kg (Calculated) : 45.5  Temp (24hrs), Avg:98.5 F (36.9 C), Min:98.5 F (36.9 C), Max:98.5 F (36.9 C)  Recent Labs  Lab 07/16/21 1720 07/16/21 2050 07/17/21 0515 07/18/21 0403 07/19/21 0420  WBC 16.2*  --  24.7* 15.9* 11.4*  CREATININE 1.19*  --  0.91  --  0.76  LATICACIDVEN 2.9* 1.6  --   --   --      Estimated Creatinine Clearance: 77.4 mL/min (by C-G formula based on SCr of 0.76 mg/dL).    No Known Allergies  Antimicrobials this admission: 12/3 Meropenem >>    Microbiology results: 12/3 BCx: E Coli 12/3 UCx: 2,000 colonies/ml group B strep  Thank you for allowing pharmacy to be a part of this patient's care.  Royetta Asal, PharmD, BCPS Clinical Pharmacist [place] Please utilize Amion for appropriate phone number to reach the unit pharmacist ([place]) 07/19/2021 9:49 AM

## 2021-07-19 NOTE — Progress Notes (Addendum)
ID brief note   Chart reviewed Afebrile, WBC trending down E coli sensi back cefazolin S ( MIC 8) AST and ALT downrending  US renal w doppler - unremarkable   Recommendations Will change meropenem to IV ceftriaxone  No PO abtx available given resistance pattern PICC line to be placed once repeat blood cultures 12/6 are negative in 48 hrs Duration of tx 14 days total given pyelonephritis in an immunocompromised patient. End date 07/29/21 A follow up with [facility] will be made ID pharmacy to place OPAT orders ID will sign off.   Diagnosis: E coli Bacteremia/Pyelonephritis  Culture Result: E coli   No Known Allergies  OPAT Orders Discharge antibiotics to be given via PICC line Discharge antibiotics: Per pharmacy protocol  Aim for Vancomycin trough 15-20 or AUC 400-550 (unless otherwise indicated) Duration: 2 weeks   End Date: 07/29/21  [facility] Care Per Protocol:  Home health RN for IV administration and teaching; PICC line care and labs.    Labs weekly while on IV antibiotics: X__ CBC with differential __ BMP X__ CMP __ CRP __ ESR __ Vancomycin trough __ CK  X__ Please pull PIC at completion of IV antibiotics __ Please leave PIC in place until doctor has seen patient or been notified  Fax weekly labs to [phone]  Clinic Follow Up Appt: 08/02/21  $RemoveB'@10'[id number]$ :45 pm

## 2021-07-19 NOTE — Progress Notes (Signed)
PROGRESS NOTE    Katie Lyons  QIH:[id number] DOB: 11/24/1985 DOA: 07/16/2021 PCP: Latanya Maudlin, MD   Brief Narrative:  35 year old female with history of ESRD on HD since 2008 and a right pelvic renal transplant in 2019 with history of CMV and recurrent pyelonephritis with ESBL, HTN, ICH presented with fever and dysuria.  She is currently on immunosuppressive therapy, CT of the abdomen pelvis showed right-sided pyelonephritis, on empirically on IV meropenem.  ID team has been consulted.  I also notified patient's transplant team.   Assessment & Plan:   Principal Problem:   Sepsis ([place]) Active Problems:   Kidney transplanted   Pyelonephritis   AKI (acute kidney injury) ([place])   Elevated LFTs   Pyelonephritis of transplanted kidney   Bacteremia   Immunocompromised patient ([place])   Sepsis secondary to right-sided acute pyelonephritis of the transplanted kidney Gram-negative rod bacteremia, E. coli - UA does not appear to be very infected but patient is immunosuppressed and CT scan is showing findings of right-sided pyelonephritis.  Currently on IV meropenem, antibiotic adjustment per ID team. - Patient's primary transplant team has been notified by me who will reach back out to me as necessary.  -Dr. Linward Foster from [facility] transplant team has been updated. - Renal ultrasound with Dopplers-unremarkable.  History of ESRD status post right-sided renal transplant 2019 - Follows with outpatient [facility] transplant team. -Myfortic dose reduced per Dr. Donah Driver recommendation, can be placed back again on home dose upon discharge.  Continue rest of the medications.  Creatinine is normal today at 0.76  Transaminitis - Continue to monitor this for now.    DVT prophylaxis: Lovenox Code Status: Full code Family Communication:    Status is: Inpatient  Remains inpatient appropriate because: Maintain hospital stay for IV antibiotics, patient is  immunosuppressed.       Nutritional status           Body mass index is 25.39 kg/m.           Subjective: Seen and examined at bedside.  Translator used.  Translator ID [id number]  Denies any complaints feeling okay.  Remains afebrile.  Review of Systems Otherwise negative except as per HPI, including: General: Denies fever, chills, night sweats or unintended weight loss. Resp: Denies cough, wheezing, shortness of breath. Cardiac: Denies chest pain, palpitations, orthopnea, paroxysmal nocturnal dyspnea. GI: Denies abdominal pain, nausea, vomiting, diarrhea or constipation GU: Denies dysuria, frequency, hesitancy or incontinence MS: Denies muscle aches, joint pain or swelling Neuro: Denies headache, neurologic deficits (focal weakness, numbness, tingling), abnormal gait Psych: Denies anxiety, depression, SI/HI/AVH Skin: Denies new rashes or lesions ID: Denies sick contacts, exotic exposures, travel  Examination:  General exam: Appears calm and comfortable  Respiratory system: Clear to auscultation. Respiratory effort normal. Cardiovascular system: S1 & S2 heard, RRR. No JVD, murmurs, rubs, gallops or clicks. No pedal edema. Gastrointestinal system: Abdomen is nondistended, soft and nontender. No organomegaly or masses felt. Normal bowel sounds heard. Central nervous system: Alert and oriented. No focal neurological deficits. Extremities: Symmetric 5 x 5 power. Skin: No rashes, lesions or ulcers Psychiatry: Judgement and insight appear normal. Mood & affect appropriate.     Objective: Vitals:   07/17/21 2136 07/18/21 0138 07/18/21 0544 07/19/21 0442  BP: 134/61 132/67 (!) 150/71 (!) 156/76  Pulse: 72 64 78 76  Resp: 19 18 19 19   Temp: 98.2 F (36.8 C) 98.4 F (36.9 C) 98.5 F (36.9 C) 98.5 F (36.9 C)  TempSrc:  Oral Oral Oral  SpO2: 100% 100% 100% 99%  Weight:      Height:        Intake/Output Summary (Last 24 hours) at 07/19/2021 1006 Last data  filed at 07/19/2021 0600 Gross per 24 hour  Intake 2375.41 ml  Output --  Net 2375.41 ml   Filed Weights   07/17/21 1448 07/17/21 1836  Weight: 59 kg 59 kg     Data Reviewed:   CBC: Recent Labs  Lab 07/16/21 1720 07/17/21 0515 07/18/21 0403 07/19/21 0420  WBC 16.2* 24.7* 15.9* 11.4*  NEUTROABS 15.3*  --  13.0*  --   HGB 12.9 12.2 11.5* 11.5*  HCT 38.6 36.7 34.8* 34.4*  MCV 85.2 86.6 86.4 86.0  PLT 177 142* 135* 409*   Basic Metabolic Panel: Recent Labs  Lab 07/16/21 1720 07/17/21 0515 07/19/21 0420  NA 135 133* 134*  K 3.4* 4.0 4.1  CL 106 106 111  CO2 20* 23 19*  GLUCOSE 115* 132* 124*  BUN 19 12 10   CREATININE 1.19* 0.91 0.76  CALCIUM 9.3 8.3* 8.7*  MG  --   --  1.4*   GFR: Estimated Creatinine Clearance: 77.4 mL/min (by C-G formula based on SCr of 0.76 mg/dL). Liver Function Tests: Recent Labs  Lab 07/16/21 1720 07/17/21 0515  AST 249* 69*  ALT 148* 103*  ALKPHOS 167* 115  BILITOT 0.9 0.9  PROT 6.9 6.0*  ALBUMIN 3.9 3.2*   No results for input(s): LIPASE, AMYLASE in the last 168 hours. No results for input(s): AMMONIA in the last 168 hours. Coagulation Profile: Recent Labs  Lab 07/16/21 1720  INR 1.1   Cardiac Enzymes: No results for input(s): CKTOTAL, CKMB, CKMBINDEX, TROPONINI in the last 168 hours. BNP (last 3 results) No results for input(s): PROBNP in the last 8760 hours. HbA1C: No results for input(s): HGBA1C in the last 72 hours. CBG: No results for input(s): GLUCAP in the last 168 hours. Lipid Profile: No results for input(s): CHOL, HDL, LDLCALC, TRIG, CHOLHDL, LDLDIRECT in the last 72 hours. Thyroid Function Tests: No results for input(s): TSH, T4TOTAL, FREET4, T3FREE, THYROIDAB in the last 72 hours. Anemia Panel: No results for input(s): VITAMINB12, FOLATE, FERRITIN, TIBC, IRON, RETICCTPCT in the last 72 hours. Sepsis Labs: Recent Labs  Lab 07/16/21 1720 07/16/21 2050  LATICACIDVEN 2.9* 1.6    Recent Results (from the  past 240 hour(s))  Culture, blood (Routine x 2)     Status: Abnormal   Collection Time: 07/16/21  5:20 PM   Specimen: BLOOD  Result Value Ref Range Status   Specimen Description   Final    BLOOD RIGHT WRIST Performed at [facility] [address], [place], [place] [address]    Special Requests   Final    BOTTLES DRAWN AEROBIC AND ANAEROBIC Blood Culture adequate volume Performed at [facility] [address], [place], [place] [address]    Culture  Setup Time   Final    GRAM NEGATIVE RODS IN BOTH AEROBIC AND ANAEROBIC BOTTLES CRITICAL VALUE NOTED.  VALUE IS CONSISTENT WITH PREVIOUSLY REPORTED AND CALLED VALUE.    Culture (A)  Final    ESCHERICHIA COLI SUSCEPTIBILITIES PERFORMED ON PREVIOUS CULTURE WITHIN THE LAST 5 DAYS. Performed at [facility], [place] [address], [place], [place] [address]    Report Status 07/19/2021 FINAL  Final  Culture, blood (Routine x 2)     Status: Abnormal   Collection Time: 07/16/21  5:25 PM  Specimen: BLOOD  Result Value Ref Range Status   Specimen Description   Final    BLOOD BLOOD RIGHT FOREARM Performed at [facility] [address], [place], [place] [address]    Special Requests   Final    BOTTLES DRAWN AEROBIC AND ANAEROBIC Blood Culture adequate volume Performed at [facility] [address] [place], [place] [address]    Culture  Setup Time   Final    GRAM NEGATIVE RODS IN BOTH AEROBIC AND ANAEROBIC BOTTLES CRITICAL RESULT CALLED TO, READ BACK BY AND VERIFIED WITH: Vena Austria D.WOFFORD ON [id number] AT 0865 BY E.PARRISH Performed at [facility], [place] [address], [place], [place] [address]    Culture ESCHERICHIA COLI (A)  Final   Report Status 07/19/2021 FINAL  Final   Organism ID, Bacteria ESCHERICHIA COLI  Final      Susceptibility   Escherichia coli - MIC*    AMPICILLIN >=32 RESISTANT Resistant     CEFAZOLIN 8 SENSITIVE Sensitive     CEFEPIME  <=0.12 SENSITIVE Sensitive     CEFTAZIDIME <=1 SENSITIVE Sensitive     CEFTRIAXONE <=0.25 SENSITIVE Sensitive     CIPROFLOXACIN 0.5 INTERMEDIATE Intermediate     GENTAMICIN <=1 SENSITIVE Sensitive     IMIPENEM <=0.25 SENSITIVE Sensitive     TRIMETH/SULFA >=320 RESISTANT Resistant     AMPICILLIN/SULBACTAM >=32 RESISTANT Resistant     PIP/TAZO 64 INTERMEDIATE Intermediate     * ESCHERICHIA COLI  Blood Culture ID Panel (Reflexed)     Status: Abnormal   Collection Time: 07/16/21  5:25 PM  Result Value Ref Range Status   Enterococcus faecalis NOT DETECTED NOT DETECTED Final   Enterococcus Faecium NOT DETECTED NOT DETECTED Final   Listeria monocytogenes NOT DETECTED NOT DETECTED Final   Staphylococcus species NOT DETECTED NOT DETECTED Final   Staphylococcus aureus (BCID) NOT DETECTED NOT DETECTED Final   Staphylococcus epidermidis NOT DETECTED NOT DETECTED Final   Staphylococcus lugdunensis NOT DETECTED NOT DETECTED Final   Streptococcus species NOT DETECTED NOT DETECTED Final   Streptococcus agalactiae NOT DETECTED NOT DETECTED Final   Streptococcus pneumoniae NOT DETECTED NOT DETECTED Final   Streptococcus pyogenes NOT DETECTED NOT DETECTED Final   A.calcoaceticus-baumannii NOT DETECTED NOT DETECTED Final   Bacteroides fragilis NOT DETECTED NOT DETECTED Final   Enterobacterales DETECTED (A) NOT DETECTED Final    Comment: Enterobacterales represent a large order of gram negative bacteria, not a single organism. CRITICAL RESULT CALLED TO, READ BACK BY AND VERIFIED WITH: PHARM D D.WOFFORD ON [id number] AT 3244 BY E.PARRISH    Enterobacter cloacae complex NOT DETECTED NOT DETECTED Final   Escherichia coli DETECTED (A) NOT DETECTED Final    Comment: CRITICAL RESULT CALLED TO, READ BACK BY AND VERIFIED WITH: PHARM D D.WOFFORD ON [id number] AT 1032 BY E.PARRISH    Klebsiella aerogenes NOT DETECTED NOT DETECTED Final   Klebsiella oxytoca NOT DETECTED NOT DETECTED Final   Klebsiella pneumoniae  NOT DETECTED NOT DETECTED Final   Proteus species NOT DETECTED NOT DETECTED Final   Salmonella species NOT DETECTED NOT DETECTED Final   Serratia marcescens NOT DETECTED NOT DETECTED Final   Haemophilus influenzae NOT DETECTED NOT DETECTED Final   Neisseria meningitidis NOT DETECTED NOT DETECTED Final   Pseudomonas aeruginosa NOT DETECTED NOT DETECTED Final   Stenotrophomonas maltophilia NOT DETECTED NOT DETECTED Final   Candida albicans NOT DETECTED NOT DETECTED Final   Candida auris NOT DETECTED NOT DETECTED Final   Candida glabrata NOT  DETECTED NOT DETECTED Final   Candida krusei NOT DETECTED NOT DETECTED Final   Candida parapsilosis NOT DETECTED NOT DETECTED Final   Candida tropicalis NOT DETECTED NOT DETECTED Final   Cryptococcus neoformans/gattii NOT DETECTED NOT DETECTED Final   CTX-M ESBL NOT DETECTED NOT DETECTED Final   Carbapenem resistance IMP NOT DETECTED NOT DETECTED Final   Carbapenem resistance KPC NOT DETECTED NOT DETECTED Final   Carbapenem resistance NDM NOT DETECTED NOT DETECTED Final   Carbapenem resist OXA 48 LIKE NOT DETECTED NOT DETECTED Final   Carbapenem resistance VIM NOT DETECTED NOT DETECTED Final    Comment: Performed at [facility], [place] [address], [place], [place] [address]  Resp Panel by RT-PCR (Flu A&B, Covid) Nasopharyngeal Swab     Status: None   Collection Time: 07/16/21  5:45 PM   Specimen: Nasopharyngeal Swab; Nasopharyngeal(NP) swabs in vial transport medium  Result Value Ref Range Status   SARS Coronavirus 2 by RT PCR NEGATIVE NEGATIVE Final    Comment: (NOTE) SARS-CoV-2 target nucleic acids are NOT DETECTED.  The SARS-CoV-2 RNA is generally detectable in upper respiratory specimens during the acute phase of infection. The lowest concentration of SARS-CoV-2 viral copies this assay can detect is 138 copies/mL. A negative result does not preclude SARS-Cov-2 infection and should not be used as the sole basis for treatment or other  patient management decisions. A negative result may occur with  improper specimen collection/handling, submission of specimen other than nasopharyngeal swab, presence of viral mutation(s) within the areas targeted by this assay, and inadequate number of viral copies(<138 copies/mL). A negative result must be combined with clinical observations, patient history, and epidemiological information. The expected result is Negative.  Fact Sheet for Patients:  EntrepreneurPulse.com.au  Fact Sheet for Healthcare Providers:  IncredibleEmployment.be  This test is no t yet approved or cleared by the [place] FDA and  has been authorized for detection and/or diagnosis of SARS-CoV-2 by FDA under an Emergency Use Authorization (EUA). This EUA will remain  in effect (meaning this test can be used) for the duration of the COVID-19 declaration under Section 564(b)(1) of the Act, 21 U.S.C.section 360bbb-3(b)(1), unless the authorization is terminated  or revoked sooner.       Influenza A by PCR NEGATIVE NEGATIVE Final   Influenza B by PCR NEGATIVE NEGATIVE Final    Comment: (NOTE) The Xpert Xpress SARS-CoV-2/FLU/RSV plus assay is intended as an aid in the diagnosis of influenza from Nasopharyngeal swab specimens and should not be used as a sole basis for treatment. Nasal washings and aspirates are unacceptable for Xpert Xpress SARS-CoV-2/FLU/RSV testing.  Fact Sheet for Patients: EntrepreneurPulse.com.au  Fact Sheet for Healthcare Providers: IncredibleEmployment.be  This test is not yet approved or cleared by the [place] FDA and has been authorized for detection and/or diagnosis of SARS-CoV-2 by FDA under an Emergency Use Authorization (EUA). This EUA will remain in effect (meaning this test can be used) for the duration of the COVID-19 declaration under Section 564(b)(1) of the Act, 21 U.S.C. section  360bbb-3(b)(1), unless the authorization is terminated or revoked.  Performed at [facility], [place] [address], [place], [place] [address]   Urine Culture     Status: Abnormal   Collection Time: 07/16/21  7:03 PM   Specimen: Urine, Clean Catch  Result Value Ref Range Status   Specimen Description   Final    URINE, CLEAN CATCH Performed at [facility], [place] [address] [place], [place] [address]  Special Requests   Final    NONE Performed at [facility], [place] [address], [place], [place] [address]    Culture (A)  Final    2,000 COLONIES/mL GROUP B STREP(S.AGALACTIAE)ISOLATED TESTING AGAINST S. AGALACTIAE NOT ROUTINELY PERFORMED DUE TO PREDICTABILITY OF AMP/PEN/VAN SUSCEPTIBILITY. Performed at [facility], [place] [address] [place], [place] [address]    Report Status 07/18/2021 FINAL  Final         Radiology Studies: US Renal Transplant w/Doppler  Result Date: 07/18/2021 CLINICAL DATA:  UTI EXAM: ULTRASOUND OF RENAL TRANSPLANT WITH RENAL DOPPLER ULTRASOUND TECHNIQUE: Ultrasound examination of the renal transplant was performed with gray-scale, color and duplex doppler evaluation. COMPARISON:  01/22/2021 FINDINGS: Transplant kidney location: Right lower quadrant. Transplant Kidney: Renal measurements: 10.4 x 5.6 x 6.9 cm = volume: 209.39mL. Normal in size and parenchymal echogenicity. No evidence of mass or hydronephrosis. No peri-transplant fluid collection seen. Color flow in the main renal artery:  Seen Color flow in the main renal vein:  Seen Duplex Doppler Evaluation: Main Renal Artery Velocity: 16.7 cm/sec Main Renal Artery Resistive Index: 0.8 Venous waveform in main renal vein:  Present Intrarenal resistive index in upper pole:  0.6 (normal 0.6-0.8; equivocal 0.8-0.9; abnormal >= 0.9) Intrarenal resistive index in lower pole: 0.6 (normal 0.6-0.8; equivocal 0.8-0.9; abnormal >= 0.9) Bladder: Urinary bladder is not  distended. Other findings:  None. IMPRESSION: No significant sonographic abnormality is seen in the renal transplant in the right lower quadrant. Electronically Signed   By: Elmer Picker M.D.   On: 07/18/2021 16:32        Scheduled Meds:  enoxaparin (LOVENOX) injection  40 mg Subcutaneous Daily   magnesium oxide  400 mg Oral BID   mycophenolate  180 mg Oral BID   predniSONE  5 mg Oral Daily   sodium bicarbonate  2,600 mg Oral TID   tacrolimus  6 mg Oral BID   Continuous Infusions:  sodium chloride 100 mL/hr at 07/19/21 0829   magnesium sulfate bolus IVPB     meropenem (MERREM) IV 1 g (07/19/21 0433)     LOS: 3 days   Time spent= 35 mins    Kaiyden Simkin Arsenio Loader, MD [facility]  If 7PM-7AM, please contact night-coverage  07/19/2021, 10:06 AM

## 2021-07-20 LAB — BASIC METABOLIC PANEL
Anion gap: 6 (ref 5–15)
BUN: 12 mg/dL (ref 6–20)
CO2: 19 mmol/L — ABNORMAL LOW (ref 22–32)
Calcium: 8.9 mg/dL (ref 8.9–10.3)
Chloride: 109 mmol/L (ref 98–111)
Creatinine, Ser: 0.61 mg/dL (ref 0.44–1.00)
GFR, Estimated: >60 mL/min (ref >60)
Glucose, Bld: 125 mg/dL — ABNORMAL HIGH (ref 70–99)
Potassium: 4 mmol/L (ref 3.5–5.1)
Sodium: 134 mmol/L — ABNORMAL LOW (ref 135–145)

## 2021-07-20 LAB — CBC
HCT: 36 % (ref 36.0–46.0)
Hemoglobin: 11.6 g/dL — ABNORMAL LOW (ref 12.0–15.0)
MCH: 28.4 pg (ref 26.0–34.0)
MCHC: 32.2 g/dL (ref 30.0–36.0)
MCV: 88.2 fL (ref 80.0–100.0)
Platelets: 156 10*3/uL (ref 150–400)
RBC: 4.08 MIL/uL (ref 3.87–5.11)
RDW: 13.2 % (ref 11.5–15.5)
WBC: 9.9 10*3/uL (ref 4.0–10.5)
nRBC: 0 % (ref 0.0–0.2)

## 2021-07-20 LAB — MAGNESIUM: Magnesium: 2 mg/dL (ref 1.7–2.4)

## 2021-07-20 MED ORDER — MYCOPHENOLATE SODIUM 180 MG PO TBEC
360.0000 mg | DELAYED_RELEASE_TABLET | Freq: Two times a day (BID) | ORAL | Status: DC
  Administered 2021-07-20 – 2021-07-21 (×2): 360 mg via ORAL
  Filled 2021-07-20 (×3): qty 2

## 2021-07-20 NOTE — Plan of Care (Signed)

## 2021-07-20 NOTE — Plan of Care (Signed)
  Problem: Education: Goal: Knowledge of General Education information will improve Description Including pain rating scale, medication(s)/side effects and non-pharmacologic comfort measures Outcome: Progressing   

## 2021-07-20 NOTE — Progress Notes (Signed)
PHARMACY CONSULT NOTE FOR:  OUTPATIENT  PARENTERAL ANTIBIOTIC THERAPY (OPAT)  Indication: E coli bacteremia/pyelonephritis  Regimen: Ceftriaxone 2 gm IV Q 24 hours  End date: 07/29/21  IV antibiotic discharge orders are pended. To discharging provider:  please sign these orders via discharge navigator,  Select New Orders & click on the button choice - Manage This Unsigned Work.     Thank you for allowing pharmacy to be a part of this patient's care.  Jimmy Footman, PharmD, BCPS, BCIDP Infectious Diseases Clinical Pharmacist Phone: [phone] 07/20/2021, 10:06 AM

## 2021-07-20 NOTE — Progress Notes (Signed)
PROGRESS NOTE    Katie Lyons  JIR:[id number] DOB: [birth year] DOA: 07/16/2021 PCP: Latanya Maudlin, MD   Brief Narrative:  35 year old female with history of ESRD on HD since 2008 and a right pelvic renal transplant in 2019 with history of CMV and recurrent pyelonephritis with ESBL, HTN, ICH presented with fever and dysuria.  She is currently on immunosuppressive therapy, CT of the abdomen pelvis showed right-sided pyelonephritis, on empirically on IV meropenem.  ID team has been consulted.  Eventually patient grew E. coli which was sensitive to Rocephin.  Patient's transplant team was updated.  Assessment & Plan:   Principal Problem:   Sepsis ([place]) Active Problems:   Kidney transplanted   Pyelonephritis   AKI (acute kidney injury) ([place])   Elevated LFTs   Pyelonephritis of transplanted kidney   Bacteremia   Immunocompromised patient ([place])   Sepsis secondary to right-sided acute pyelonephritis of the transplanted kidney Gram-negative rod bacteremia, E. coli - CT scan showing acute right-sided pyelonephritis.  Eventually she grew E. coli which was sensitive to Rocephin.  Currently she is on Rocephin 2 g IV every 24 hours.  She will need total of 14 days of antibiotic.  I have spoken with patient's transplant team including Dr. Linward Foster, Dr Trinna Post and Dr Shelle Iron [facility].  They have agreed to continue current treatment with IV Rocephin with total of 14 days once cultures are negative they recommend placing tunneled catheter instead of PICC line which she has had in the past.  Dr. Caro Hight requested to be notifed prior to dc so he can arrange for follow up.   - Renal ultrasound with Dopplers-unremarkable.  History of ESRD status post right-sided renal transplant 2019 - Follows with outpatient [facility] transplant team. - Dr. Trinna Post requested to resume Myfortic 360 mg twice daily now that she appears to be stable and doing better.  She is at  high risk of rejection  Transaminitis - Continue to monitor this for now.    DVT prophylaxis: Lovenox Code Status: Full code Family Communication:    Status is: Inpatient  Remains inpatient appropriate because: Maintain hospital stay until repeat cultures are clear for 48 hours thereafter she will require tunneled catheter to be placed by IR and outpatient Rocephin arrangements.       Subjective: Seen and examined at bedside.  Translator used.  Translator ID [id number]  Seen and examined at bedside, patient has any complaints overall feels well.  Remains afebrile.   Examination:  Constitutional: Not in acute distress Respiratory: Clear to auscultation bilaterally Cardiovascular: Normal sinus rhythm, no rubs Abdomen: Nontender nondistended good bowel sounds Musculoskeletal: No edema noted Skin: No rashes seen Neurologic: CN 2-12 grossly intact.  And nonfocal Psychiatric: Normal judgment and insight. Alert and oriented x 3. Normal mood.    Objective: Vitals:   07/18/21 0544 07/19/21 0442 07/19/21 2130 07/20/21 0605  BP: (!) 150/71 (!) 156/76 (!) 151/75 (!) 145/64  Pulse: 78 76 60 72  Resp: 19 19 20 18   Temp: 98.5 F (36.9 C) 98.5 F (36.9 C) 98.2 F (36.8 C) 98.2 F (36.8 C)  TempSrc: Oral Oral Oral Oral  SpO2: 100% 99% 99% 100%  Weight:      Height:        Intake/Output Summary (Last 24 hours) at 07/20/2021 1045 Last data filed at 07/20/2021 1000 Gross per 24 hour  Intake 2341.39 ml  Output --  Net 2341.39 ml   Filed Weights   07/17/21 1448 07/17/21 1836  Weight: 59 kg 59 kg     Data Reviewed:   CBC: Recent Labs  Lab 07/16/21 1720 07/17/21 0515 07/18/21 0403 07/19/21 0420 07/20/21 0409  WBC 16.2* 24.7* 15.9* 11.4* 9.9  NEUTROABS 15.3*  --  13.0*  --   --   HGB 12.9 12.2 11.5* 11.5* 11.6*  HCT 38.6 36.7 34.8* 34.4* 36.0  MCV 85.2 86.6 86.4 86.0 88.2  PLT 177 142* 135* 130* 433   Basic Metabolic Panel: Recent Labs  Lab 07/16/21 1720  07/17/21 0515 07/19/21 0420 07/20/21 0409  NA 135 133* 134* 134*  K 3.4* 4.0 4.1 4.0  CL 106 106 111 109  CO2 20* 23 19* 19*  GLUCOSE 115* 132* 124* 125*  BUN 19 12 10 12   CREATININE 1.19* 0.91 0.76 0.61  CALCIUM 9.3 8.3* 8.7* 8.9  MG  --   --  1.4* 2.0   GFR: Estimated Creatinine Clearance: 77.4 mL/min (by C-G formula based on SCr of 0.61 mg/dL). Liver Function Tests: Recent Labs  Lab 07/16/21 1720 07/17/21 0515  AST 249* 69*  ALT 148* 103*  ALKPHOS 167* 115  BILITOT 0.9 0.9  PROT 6.9 6.0*  ALBUMIN 3.9 3.2*   No results for input(s): LIPASE, AMYLASE in the last 168 hours. No results for input(s): AMMONIA in the last 168 hours. Coagulation Profile: Recent Labs  Lab 07/16/21 1720  INR 1.1   Cardiac Enzymes: No results for input(s): CKTOTAL, CKMB, CKMBINDEX, TROPONINI in the last 168 hours. BNP (last 3 results) No results for input(s): PROBNP in the last 8760 hours. HbA1C: No results for input(s): HGBA1C in the last 72 hours. CBG: No results for input(s): GLUCAP in the last 168 hours. Lipid Profile: No results for input(s): CHOL, HDL, LDLCALC, TRIG, CHOLHDL, LDLDIRECT in the last 72 hours. Thyroid Function Tests: No results for input(s): TSH, T4TOTAL, FREET4, T3FREE, THYROIDAB in the last 72 hours. Anemia Panel: No results for input(s): VITAMINB12, FOLATE, FERRITIN, TIBC, IRON, RETICCTPCT in the last 72 hours. Sepsis Labs: Recent Labs  Lab 07/16/21 1720 07/16/21 2050  LATICACIDVEN 2.9* 1.6    Recent Results (from the past 240 hour(s))  Culture, blood (Routine x 2)     Status: Abnormal   Collection Time: 07/16/21  5:20 PM   Specimen: BLOOD  Result Value Ref Range Status   Specimen Description   Final    BLOOD RIGHT WRIST Performed at [facility] [address], [place], [place] [address]    Special Requests   Final    BOTTLES DRAWN AEROBIC AND ANAEROBIC Blood Culture adequate volume Performed at [facility] [address], [place], [place] [address]    Culture  Setup Time   Final    GRAM NEGATIVE RODS IN BOTH AEROBIC AND ANAEROBIC BOTTLES CRITICAL VALUE NOTED.  VALUE IS CONSISTENT WITH PREVIOUSLY REPORTED AND CALLED VALUE.    Culture (A)  Final    ESCHERICHIA COLI SUSCEPTIBILITIES PERFORMED ON PREVIOUS CULTURE WITHIN THE LAST 5 DAYS. Performed at [facility], [place] [address], [place], [place] [address]    Report Status 07/19/2021 FINAL  Final  Culture, blood (Routine x 2)     Status: Abnormal   Collection Time: 07/16/21  5:25 PM   Specimen: BLOOD  Result Value Ref Range Status   Specimen Description   Final    BLOOD BLOOD RIGHT FOREARM Performed at [facility] [address] [place], [place] [address]    Special Requests   Final  BOTTLES DRAWN AEROBIC AND ANAEROBIC Blood Culture adequate volume Performed at [facility] [address], [place], [place] [address]    Culture  Setup Time   Final    GRAM NEGATIVE RODS IN BOTH AEROBIC AND ANAEROBIC BOTTLES CRITICAL RESULT CALLED TO, READ BACK BY AND VERIFIED WITH: Vena Austria D.WOFFORD ON [id number] AT 1751 BY E.PARRISH Performed at [facility], [place] [address] [place],  [address]    Culture ESCHERICHIA COLI (A)  Final   Report Status 07/19/2021 FINAL  Final   Organism ID, Bacteria ESCHERICHIA COLI  Final      Susceptibility   Escherichia coli - MIC*    AMPICILLIN >=32 RESISTANT Resistant     CEFAZOLIN 8 SENSITIVE Sensitive     CEFEPIME <=0.12 SENSITIVE Sensitive     CEFTAZIDIME <=1 SENSITIVE Sensitive     CEFTRIAXONE <=0.25 SENSITIVE Sensitive     CIPROFLOXACIN 0.5 INTERMEDIATE Intermediate     GENTAMICIN <=1 SENSITIVE Sensitive     IMIPENEM <=0.25 SENSITIVE Sensitive     TRIMETH/SULFA >=320 RESISTANT Resistant     AMPICILLIN/SULBACTAM >=32 RESISTANT Resistant     PIP/TAZO 64 INTERMEDIATE Intermediate     * ESCHERICHIA COLI  Blood Culture ID Panel (Reflexed)      Status: Abnormal   Collection Time: 07/16/21  5:25 PM  Result Value Ref Range Status   Enterococcus faecalis NOT DETECTED NOT DETECTED Final   Enterococcus Faecium NOT DETECTED NOT DETECTED Final   Listeria monocytogenes NOT DETECTED NOT DETECTED Final   Staphylococcus species NOT DETECTED NOT DETECTED Final   Staphylococcus aureus (BCID) NOT DETECTED NOT DETECTED Final   Staphylococcus epidermidis NOT DETECTED NOT DETECTED Final   Staphylococcus lugdunensis NOT DETECTED NOT DETECTED Final   Streptococcus species NOT DETECTED NOT DETECTED Final   Streptococcus agalactiae NOT DETECTED NOT DETECTED Final   Streptococcus pneumoniae NOT DETECTED NOT DETECTED Final   Streptococcus pyogenes NOT DETECTED NOT DETECTED Final   A.calcoaceticus-baumannii NOT DETECTED NOT DETECTED Final   Bacteroides fragilis NOT DETECTED NOT DETECTED Final   Enterobacterales DETECTED (A) NOT DETECTED Final    Comment: Enterobacterales represent a large order of gram negative bacteria, not a single organism. CRITICAL RESULT CALLED TO, READ BACK BY AND VERIFIED WITH: PHARM D D.WOFFORD ON [id number] AT 5361 BY E.PARRISH    Enterobacter cloacae complex NOT DETECTED NOT DETECTED Final   Escherichia coli DETECTED (A) NOT DETECTED Final    Comment: CRITICAL RESULT CALLED TO, READ BACK BY AND VERIFIED WITH: PHARM D D.WOFFORD ON [id number] AT 1032 BY E.PARRISH    Klebsiella aerogenes NOT DETECTED NOT DETECTED Final   Klebsiella oxytoca NOT DETECTED NOT DETECTED Final   Klebsiella pneumoniae NOT DETECTED NOT DETECTED Final   Proteus species NOT DETECTED NOT DETECTED Final   Salmonella species NOT DETECTED NOT DETECTED Final   Serratia marcescens NOT DETECTED NOT DETECTED Final   Haemophilus influenzae NOT DETECTED NOT DETECTED Final   Neisseria meningitidis NOT DETECTED NOT DETECTED Final   Pseudomonas aeruginosa NOT DETECTED NOT DETECTED Final   Stenotrophomonas maltophilia NOT DETECTED NOT DETECTED Final   Candida  albicans NOT DETECTED NOT DETECTED Final   Candida auris NOT DETECTED NOT DETECTED Final   Candida glabrata NOT DETECTED NOT DETECTED Final   Candida krusei NOT DETECTED NOT DETECTED Final   Candida parapsilosis NOT DETECTED NOT DETECTED Final   Candida tropicalis NOT DETECTED NOT DETECTED Final   Cryptococcus neoformans/gattii NOT DETECTED NOT DETECTED Final   CTX-M ESBL NOT DETECTED  NOT DETECTED Final   Carbapenem resistance IMP NOT DETECTED NOT DETECTED Final   Carbapenem resistance KPC NOT DETECTED NOT DETECTED Final   Carbapenem resistance NDM NOT DETECTED NOT DETECTED Final   Carbapenem resist OXA 48 LIKE NOT DETECTED NOT DETECTED Final   Carbapenem resistance VIM NOT DETECTED NOT DETECTED Final    Comment: Performed at [facility], [place] [address], [place], [place] [address]  Resp Panel by RT-PCR (Flu A&B, Covid) Nasopharyngeal Swab     Status: None   Collection Time: 07/16/21  5:45 PM   Specimen: Nasopharyngeal Swab; Nasopharyngeal(NP) swabs in vial transport medium  Result Value Ref Range Status   SARS Coronavirus 2 by RT PCR NEGATIVE NEGATIVE Final    Comment: (NOTE) SARS-CoV-2 target nucleic acids are NOT DETECTED.  The SARS-CoV-2 RNA is generally detectable in upper respiratory specimens during the acute phase of infection. The lowest concentration of SARS-CoV-2 viral copies this assay can detect is 138 copies/mL. A negative result does not preclude SARS-Cov-2 infection and should not be used as the sole basis for treatment or other patient management decisions. A negative result may occur with  improper specimen collection/handling, submission of specimen other than nasopharyngeal swab, presence of viral mutation(s) within the areas targeted by this assay, and inadequate number of viral copies(<138 copies/mL). A negative result must be combined with clinical observations, patient history, and epidemiological information. The expected result is Negative.  Fact  Sheet for Patients:  EntrepreneurPulse.com.au  Fact Sheet for Healthcare Providers:  IncredibleEmployment.be  This test is no t yet approved or cleared by the [place] FDA and  has been authorized for detection and/or diagnosis of SARS-CoV-2 by FDA under an Emergency Use Authorization (EUA). This EUA will remain  in effect (meaning this test can be used) for the duration of the COVID-19 declaration under Section 564(b)(1) of the Act, 21 U.S.C.section 360bbb-3(b)(1), unless the authorization is terminated  or revoked sooner.       Influenza A by PCR NEGATIVE NEGATIVE Final   Influenza B by PCR NEGATIVE NEGATIVE Final    Comment: (NOTE) The Xpert Xpress SARS-CoV-2/FLU/RSV plus assay is intended as an aid in the diagnosis of influenza from Nasopharyngeal swab specimens and should not be used as a sole basis for treatment. Nasal washings and aspirates are unacceptable for Xpert Xpress SARS-CoV-2/FLU/RSV testing.  Fact Sheet for Patients: EntrepreneurPulse.com.au  Fact Sheet for Healthcare Providers: IncredibleEmployment.be  This test is not yet approved or cleared by the [place] FDA and has been authorized for detection and/or diagnosis of SARS-CoV-2 by FDA under an Emergency Use Authorization (EUA). This EUA will remain in effect (meaning this test can be used) for the duration of the COVID-19 declaration under Section 564(b)(1) of the Act, 21 U.S.C. section 360bbb-3(b)(1), unless the authorization is terminated or revoked.  Performed at [facility], [place] [address], [place], [place] [address]   Urine Culture     Status: Abnormal   Collection Time: 07/16/21  7:03 PM   Specimen: Urine, Clean Catch  Result Value Ref Range Status   Specimen Description   Final    URINE, CLEAN CATCH Performed at [facility], [place] [address] [place], [place] [address]     Special Requests   Final    NONE Performed at [facility], [place] [address], [place], [place] [address]    Culture (A)  Final    2,000 COLONIES/mL GROUP B STREP(S.AGALACTIAE)ISOLATED TESTING AGAINST S. AGALACTIAE NOT ROUTINELY  PERFORMED DUE TO PREDICTABILITY OF AMP/PEN/VAN SUSCEPTIBILITY. Performed at [facility], [place] [address] [place], [place] [address]    Report Status 07/18/2021 FINAL  Final  Culture, blood (routine x 2)     Status: None (Preliminary result)   Collection Time: 07/19/21  9:03 PM   Specimen: BLOOD RIGHT HAND  Result Value Ref Range Status   Specimen Description   Final    BLOOD RIGHT HAND Performed at [facility] [address] [place], [place] [address]    Special Requests   Final    BOTTLES DRAWN AEROBIC ONLY Blood Culture adequate volume Performed at [facility] [address], [place], [place] [address]    Culture   Final    NO GROWTH < 12 HOURS Performed at [facility] [address], [place], [place] [address]    Report Status PENDING  Incomplete  Culture, blood (routine x 2)     Status: None (Preliminary result)   Collection Time: 07/19/21  9:03 PM   Specimen: Right Antecubital; Blood  Result Value Ref Range Status   Specimen Description   Final    RIGHT ANTECUBITAL Performed at [facility] [address] [place], [place] [address]    Special Requests   Final    BOTTLES DRAWN AEROBIC ONLY Blood Culture adequate volume Performed at [facility] [address], [place], [place] [address]    Culture   Final    NO GROWTH < 12 HOURS Performed at [facility] [address] [place], [place] [address]    Report Status PENDING  Incomplete         Radiology Studies: US Renal Transplant w/Doppler  Result Date: 07/18/2021 CLINICAL DATA:  UTI EXAM: ULTRASOUND OF RENAL TRANSPLANT WITH RENAL DOPPLER ULTRASOUND TECHNIQUE:  Ultrasound examination of the renal transplant was performed with gray-scale, color and duplex doppler evaluation. COMPARISON:  01/22/2021 FINDINGS: Transplant kidney location: Right lower quadrant. Transplant Kidney: Renal measurements: 10.4 x 5.6 x 6.9 cm = volume: 209.60mL. Normal in size and parenchymal echogenicity. No evidence of mass or hydronephrosis. No peri-transplant fluid collection seen. Color flow in the main renal artery:  Seen Color flow in the main renal vein:  Seen Duplex Doppler Evaluation: Main Renal Artery Velocity: 16.7 cm/sec Main Renal Artery Resistive Index: 0.8 Venous waveform in main renal vein:  Present Intrarenal resistive index in upper pole:  0.6 (normal 0.6-0.8; equivocal 0.8-0.9; abnormal >= 0.9) Intrarenal resistive index in lower pole: 0.6 (normal 0.6-0.8; equivocal 0.8-0.9; abnormal >= 0.9) Bladder: Urinary bladder is not distended. Other findings:  None. IMPRESSION: No significant sonographic abnormality is seen in the renal transplant in the right lower quadrant. Electronically Signed   By: Elmer Picker M.D.   On: 07/18/2021 16:32        Scheduled Meds:  enoxaparin (LOVENOX) injection  40 mg Subcutaneous Daily   magnesium oxide  400 mg Oral BID   mycophenolate  180 mg Oral BID   predniSONE  5 mg Oral Daily   sodium bicarbonate  2,600 mg Oral TID   tacrolimus  6 mg Oral BID   Continuous Infusions:  cefTRIAXone (ROCEPHIN)  IV 2 g (07/19/21 2201)     LOS: 4 days   Time spent= 35 mins    Lakeyta Vandenheuvel Arsenio Loader, MD [facility]  If 7PM-7AM, please contact night-coverage  07/20/2021, 10:45 AM

## 2021-07-20 NOTE — TOC Progression Note (Signed)
Transition of Care [facility]) - Progression Note    Patient Details  Name: Katie Lyons MRN: [id number] Date of Birth: 18-Aug-1985  [facility]) CM/SW Contact  Purcell Mouton, RN Phone Number: 07/20/2021, 3:15 PM  Clinical Narrative:    Pt use [facility] for [facility]. Referral for IV ABX given to [facility], RN with infusion.    Expected Discharge Plan: [place] Barriers to Discharge: No Barriers Identified  Expected Discharge Plan and Services Expected Discharge Plan: [place] arrangements for the past 2 months: Single Family Home                                       Social Determinants of Health (SDOH) Interventions    Readmission Risk Interventions No flowsheet data found.

## 2021-07-21 ENCOUNTER — Inpatient Hospital Stay (HOSPITAL_COMMUNITY): Payer: BLUE CROSS/BLUE SHIELD

## 2021-07-21 HISTORY — PX: IR PERC TUN PERIT CATH WO PORT S&I /IMAG: IMG2327

## 2021-07-21 LAB — MAGNESIUM: Magnesium: 1.6 mg/dL — ABNORMAL LOW (ref 1.7–2.4)

## 2021-07-21 LAB — BASIC METABOLIC PANEL
Anion gap: 8 (ref 5–15)
BUN: 16 mg/dL (ref 6–20)
CO2: 20 mmol/L — ABNORMAL LOW (ref 22–32)
Calcium: 9.7 mg/dL (ref 8.9–10.3)
Chloride: 107 mmol/L (ref 98–111)
Creatinine, Ser: 0.58 mg/dL (ref 0.44–1.00)
GFR, Estimated: >60 mL/min (ref >60)
Glucose, Bld: 115 mg/dL — ABNORMAL HIGH (ref 70–99)
Potassium: 4.1 mmol/L (ref 3.5–5.1)
Sodium: 135 mmol/L (ref 135–145)

## 2021-07-21 LAB — CBC
HCT: 36.5 % (ref 36.0–46.0)
Hemoglobin: 12.5 g/dL (ref 12.0–15.0)
MCH: 28.8 pg (ref 26.0–34.0)
MCHC: 34.2 g/dL (ref 30.0–36.0)
MCV: 84.1 fL (ref 80.0–100.0)
Platelets: 196 10*3/uL (ref 150–400)
RBC: 4.34 MIL/uL (ref 3.87–5.11)
RDW: 13.1 % (ref 11.5–15.5)
WBC: 7.6 10*3/uL (ref 4.0–10.5)
nRBC: 0 % (ref 0.0–0.2)

## 2021-07-21 MED ORDER — CEFTRIAXONE IV (FOR PTA / DISCHARGE USE ONLY): 2.0000 g | INTRAVENOUS | 0 refills | Status: AC

## 2021-07-21 MED ORDER — LIDOCAINE-EPINEPHRINE (PF) 2 %-1:200000 IJ SOLN
INTRAMUSCULAR | Status: AC
  Filled 2021-07-21: qty 20

## 2021-07-21 NOTE — TOC Progression Note (Signed)
Transition of Care [facility]) - Progression Note    Patient Details  Name: Katie Lyons MRN: [id number] Date of Birth: 14-Mar-1986  Transition [facility] Care [facility]) CM/SW Contact  Purcell Mouton, RN Phone Number: 07/21/2021, 8:45 AM  Clinical Narrative:    Pt is a [employer] plan through [facility] and they will have to provide her Home IV ABX. Will need OPAT signed. [facility] [phone], Fax [phone].   Expected Discharge Plan: [place] Barriers to Discharge: No Barriers Identified  Expected Discharge Plan and Services Expected Discharge Plan: [place] arrangements for the past 2 months: Single Family Home                                       Social Determinants of Health (SDOH) Interventions    Readmission Risk Interventions No flowsheet data found.

## 2021-07-21 NOTE — Procedures (Signed)
Interventional Radiology Procedure:   Indications: Pyelonephritis of transplant kidney  Procedure: Tunneled central line placement  Findings: Right jugular single lumen Powerline, tip at SVC/RA junction.  Length 22 cm.  Complications: No immediate complications noted.     EBL: Minimal  Plan: Central line is ready to use.     Kenishia Plack R. Anselm Pancoast, MD  Pager: [phone]

## 2021-07-21 NOTE — Progress Notes (Signed)
Interpreter [phone] Katie Lyons  Patient retiurned to room post tunneled cath placement, a/ox4, c/o minimal discomfort 1/10 at surgical site, no other distress or concerned voiced. Right tunneled cath noted CDI. VSS.   Discharge instruction reviewed with patient. All question answered at this time. Per MD to monitor patient for 1 hour prior to discharge after procedure. Patient called for transportation.

## 2021-07-21 NOTE — TOC Progression Note (Signed)
Transition of Care [facility]) - Progression Note    Patient Details  Name: Niah Heinle MRN: [id number] Date of Birth: 16-May-1986  Transition of Care [facility]) CM/SW Contact  Purcell Mouton, RN Phone Number: 07/21/2021, 1:01 PM  Clinical Narrative:    IV ABX and discharge summary was fax to [facility].    Expected Discharge Plan: [place] Barriers to Discharge: No Barriers Identified  Expected Discharge Plan and Services Expected Discharge Plan: [place] arrangements for the past 2 months: Single Family Home Expected Discharge Date: 07/21/21                                     Social Determinants of Health (SDOH) Interventions    Readmission Risk Interventions No flowsheet data found.

## 2021-07-21 NOTE — Plan of Care (Signed)

## 2021-07-21 NOTE — Discharge Summary (Signed)
Physician Discharge Summary  Shloka Baldridge BTD:[id number] DOB: 1985-09-23 DOA: 07/16/2021  PCP: Latanya Maudlin, MD  Admit date: 07/16/2021 Discharge date: 07/21/2021  Admitted From: Home Disposition: Home  Recommendations for Outpatient Follow-up:  Follow up with PCP in 1-2 weeks Please obtain BMP/CBC in one week your next doctors visit.  IV Rocephin 2 g every 24 hours, last day 12/22.  Tunneled catheter can be removed thereafter. Case discussed with Dr. Saundra Shelling, ID Transplant at [facility].  Her follow-up has been arranged with him on Thursday, 07/28/2021 at 2:40 PM Continue taking previously prescribed immunosuppressants.  No further changes have been made to it  Discharge Condition: Stable CODE STATUS: Full code Diet recommendation: Regular  Brief/Interim Summary: 35 year old female with history of ESRD on HD since 2008 and a right pelvic renal transplant in 2019 with history of CMV and recurrent pyelonephritis with ESBL, HTN, ICH presented with fever and dysuria.  She is currently on immunosuppressive therapy, CT of the abdomen pelvis showed right-sided pyelonephritis, on empirically on IV meropenem.  ID team has been consulted.  Eventually patient grew E. coli which was sensitive to Rocephin.  Patient's transplant team was updated.  Patient can be discharged today once interventional radiology has placed a tunneled catheter.  As of now they are not sure if they will be able to place it later today or may have to postpone her case tomorrow due to scheduling conflict.   Assessment & Plan:   Principal Problem:   Sepsis ([place]) Active Problems:   Kidney transplanted   Pyelonephritis   AKI (acute kidney injury) ([place])   Elevated LFTs   Pyelonephritis of transplanted kidney   Bacteremia   Immunocompromised patient ([place])     Sepsis secondary to right-sided acute pyelonephritis of the transplanted kidney Gram-negative rod bacteremia, E. coli - CT scan  showing acute right-sided pyelonephritis.  Eventually she grew E. coli which was sensitive to Rocephin.  Currently she is on Rocephin 2 g IV every 24 hours.  She will need total of 14 days of antibiotic.  Last day of antibiotic will be 07/29/2021. Patient's entire transplant team at [facility] had been updated periodically during this hospital stay including Dr. Linward Foster, Dr. Trinna Post and Dr. Saundra Shelling.  Greatly appreciate their assistance.  Dr. Saundra Shelling was able to also help follow-up arrangements with him on 07/28/21 at 12:40 PM.  They will also arrange for her catheter removal once her treatments been completed.  - Renal ultrasound with Dopplers-unremarkable.   History of ESRD status post right-sided renal transplant 2019 - Follows with outpatient [facility] transplant team. - Transplant surgeon Dr. Trinna Post is okay with resuming her preadmission immunosuppressants   Transaminitis - Continue to monitor this for now.      Body mass index is 25.39 kg/m.         Discharge Diagnoses:  Principal Problem:   Sepsis ([place]) Active Problems:   Kidney transplanted   Pyelonephritis   AKI (acute kidney injury) ([place])   Elevated LFTs   Pyelonephritis of transplanted kidney   Bacteremia   Immunocompromised patient [facility])      Consultations: Infectious disease [place] patient's transplant team at [facility]  Subjective: Feels great no complaints.  Discharge Exam: Vitals:   07/21/21 0515 07/21/21 1005  BP: (!) 153/62 130/71  Pulse: 60 60  Resp: 18 18  Temp: 98.1 F (36.7 C) 97.8 F (36.6 C)  SpO2: 100% 100%   Vitals:   07/20/21 1403 07/20/21 2111 07/21/21  0515 07/21/21 1005  BP: (!) 160/87 (!) 153/73 (!) 153/62 130/71  Pulse: 65 69 60 60  Resp: 16 17 18 18   Temp: 98.4 F (36.9 C) 98.6 F (37 C) 98.1 F (36.7 C) 97.8 F (36.6 C)  TempSrc: Oral Oral Oral Oral  SpO2: 100% 100% 100% 100%  Weight:      Height:        General: Pt is alert, awake, not  in acute distress Cardiovascular: RRR, S1/S2 +, no rubs, no gallops Respiratory: CTA bilaterally, no wheezing, no rhonchi Abdominal: Soft, NT, ND, bowel sounds + Extremities: no edema, no cyanosis  Discharge Instructions  Discharge Instructions     [facility] pharmacist to adjust dose for Vancomycin, Aminoglycosides and other anti-infective therapies as requested by physician.   Complete by: As directed    [facility] to provide Cath Flo 2mg    Complete by: As directed    Administer for PICC line occlusion and as ordered by physician for other access device issues.   Anaphylaxis Kit: Provided to treat any anaphylactic reaction to the medication being provided to the patient if First Dose or when requested by physician   Complete by: As directed    Epinephrine 1mg /ml vial / amp: Administer 0.3mg  (0.85ml) subcutaneously once for moderate to severe anaphylaxis, nurse to call physician and pharmacy when reaction occurs and call 911 if needed for immediate care   Diphenhydramine 50mg /ml IV vial: Administer 25-50mg  IV/IM PRN for first dose reaction, rash, itching, mild reaction, nurse to call physician and pharmacy when reaction occurs   Sodium Chloride 0.9% NS 519ml IV: Administer if needed for hypovolemic blood pressure drop or as ordered by physician after call to physician with anaphylactic reaction   Change dressing on IV access line weekly and PRN   Complete by: As directed    Flush IV access with Sodium Chloride 0.9% and Heparin 10 units/ml or 100 units/ml   Complete by: As directed    Home infusion instructions - [facility]   Complete by: As directed    Instructions: Flush IV access with Sodium Chloride 0.9% and Heparin 10units/ml or 100units/ml   Change dressing on IV access line: Weekly and PRN   Instructions Cath Flo 2mg : Administer for PICC Line occlusion and as ordered by physician for other access device   [facility] pharmacist to  adjust dose for: Vancomycin, Aminoglycosides and other anti-infective therapies as requested by physician   Method of administration may be changed at the discretion of home infusion pharmacist based upon assessment of the patient and/or caregiver's ability to self-administer the medication ordered   Complete by: As directed       Allergies as of 07/21/2021   No Known Allergies      Medication List     TAKE these medications    acetaminophen 325 MG tablet Commonly known as: TYLENOL Take 2 tablets (650 mg total) by mouth every 6 (six) hours as needed for fever.   cefTRIAXone  IVPB Commonly known as: ROCEPHIN Inject 2 g into the vein daily for 9 days. Indication:  E Coli bacteremia/pyelonephritis  First Dose: Yes Last Day of Therapy:  07/29/21 Labs - Once weekly:  CBC/D and BMP, Labs - Every other week:  ESR and CRP Method of administration: IV Push Method of administration may be changed at the discretion of home infusion pharmacist based upon assessment of the patient and/or caregiver's ability to self-administer the medication ordered.   magnesium oxide 400  MG tablet Commonly known as: MAG-OX Take 400 mg by mouth 2 (two) times daily.   mycophenolate 180 MG EC tablet Commonly known as: MYFORTIC Take 360 mg by mouth 2 (two) times daily.   ondansetron 4 MG tablet Commonly known as: ZOFRAN Take 1 tablet (4 mg total) by mouth every 6 (six) hours as needed for nausea.   polyethylene glycol 17 g packet Commonly known as: MIRALAX / GLYCOLAX Take 17 g by mouth daily as needed for mild constipation.   predniSONE 5 MG tablet Commonly known as: DELTASONE Take 1 tablet (5 mg total) by mouth daily. Resume after finishing steroid taper.   sodium bicarbonate 650 MG tablet Take 2,600 mg by mouth 3 (three) times daily. Four tablets three times a day   sulfamethoxazole-trimethoprim 400-80 MG tablet Commonly known as: BACTRIM Take 1 tablet by mouth every Monday, Wednesday, and  Friday.   tacrolimus 1 MG capsule Commonly known as: PROGRAF Take 6 mg by mouth 2 (two) times daily.               Discharge Care Instructions  (From admission, onward)           Start     Ordered   07/21/21 0000  Change dressing on IV access line weekly and PRN  (Home infusion instructions - [facility] )        07/21/21 1203            Follow-up Information     [facility].   Specialty: Home Health Services               No Known Allergies  You were cared for by a hospitalist during your hospital stay. If you have any questions about your discharge medications or the care you received while you were in the hospital after you are discharged, you can call the unit and asked to speak with the hospitalist on call if the hospitalist that took care of you is not available. Once you are discharged, your primary care physician will handle any further medical issues. Please note that no refills for any discharge medications will be authorized once you are discharged, as it is imperative that you return to your primary care physician (or establish a relationship with a primary care physician if you do not have one) for your aftercare needs so that they can reassess your need for medications and monitor your lab values.   Procedures/Studies: DG Chest 2 View  Result Date: 07/16/2021 CLINICAL DATA:  Body aches, chills, fever since this a.m. Chronic ischemic heart disease history. EXAM: CHEST - 2 VIEW COMPARISON:  March 09, 2021 FINDINGS: Similar cardiomegaly and pulmonary vascular engorgement. No overt pulmonary edema. No focal airspace consolidation. No visible pleural effusion or pneumothorax. The visualized skeletal structures are unremarkable. IMPRESSION: Similar cardiomegaly and pulmonary vascular congestion. No overt pulmonary edema or focal airspace consolidation. Electronically Signed   By: Dahlia Bailiff M.D.   On: 07/16/2021  17:46   US Renal Transplant w/Doppler  Result Date: 07/18/2021 CLINICAL DATA:  UTI EXAM: ULTRASOUND OF RENAL TRANSPLANT WITH RENAL DOPPLER ULTRASOUND TECHNIQUE: Ultrasound examination of the renal transplant was performed with gray-scale, color and duplex doppler evaluation. COMPARISON:  01/22/2021 FINDINGS: Transplant kidney location: Right lower quadrant. Transplant Kidney: Renal measurements: 10.4 x 5.6 x 6.9 cm = volume: 209.65mL. Normal in size and parenchymal echogenicity. No evidence of mass or hydronephrosis. No peri-transplant fluid collection seen. Color flow in the main renal  artery:  Seen Color flow in the main renal vein:  Seen Duplex Doppler Evaluation: Main Renal Artery Velocity: 16.7 cm/sec Main Renal Artery Resistive Index: 0.8 Venous waveform in main renal vein:  Present Intrarenal resistive index in upper pole:  0.6 (normal 0.6-0.8; equivocal 0.8-0.9; abnormal >= 0.9) Intrarenal resistive index in lower pole: 0.6 (normal 0.6-0.8; equivocal 0.8-0.9; abnormal >= 0.9) Bladder: Urinary bladder is not distended. Other findings:  None. IMPRESSION: No significant sonographic abnormality is seen in the renal transplant in the right lower quadrant. Electronically Signed   By: Elmer Picker M.D.   On: 07/18/2021 16:32   CT ABDOMEN PELVIS W CONTRAST  Result Date: 07/16/2021 CLINICAL DATA:  Abdominal pain and fevers, history of prior renal transplant EXAM: CT ABDOMEN AND PELVIS WITH CONTRAST TECHNIQUE: Multidetector CT imaging of the abdomen and pelvis was performed using the standard protocol following bolus administration of intravenous contrast. CONTRAST:  47mL OMNIPAQUE IOHEXOL 350 MG/ML SOLN COMPARISON:  03/09/2021 FINDINGS: Lower chest: Lung bases demonstrate minimal dependent atelectatic changes. Hepatobiliary: No focal liver abnormality is seen. No gallstones, gallbladder wall thickening, or biliary dilatation. Pancreas: Unremarkable. No pancreatic ductal dilatation or surrounding  inflammatory changes. Spleen: Normal in size without focal abnormality. Adrenals/Urinary Tract: Adrenal glands are within normal limits. Native kidneys are shrunken consistent with the given clinical history of end-stage renal disease. In the right lower quadrant, there remains a renal transplant with some patchy areas of decreased enhancement identified particularly in the upper pole with increase in perinephric stranding when compared with the prior exam. These changes are suspicious for underlying pyelonephritis. No obstructive changes are seen. Bladder is partially distended. Stomach/Bowel: The appendix is within normal limits. No obstructive or inflammatory changes of the colon are noted. Small bowel and stomach are within normal limits. Vascular/Lymphatic: Aortic atherosclerosis. No enlarged abdominal or pelvic lymph nodes. Reproductive: Uterus and bilateral adnexa are unremarkable. Other: No abdominal wall hernia or abnormality. No abdominopelvic ascites. Musculoskeletal: No acute or significant osseous findings. IMPRESSION: Findings suspicious for pyelonephritis involving the renal transplant with areas of patchy enhancement identified. No obstructive changes are seen. Electronically Signed   By: Inez Catalina M.D.   On: 07/16/2021 21:35     The results of significant diagnostics from this hospitalization (including imaging, microbiology, ancillary and laboratory) are listed below for reference.     Microbiology: Recent Results (from the past 240 hour(s))  Culture, blood (Routine x 2)     Status: Abnormal   Collection Time: 07/16/21  5:20 PM   Specimen: BLOOD  Result Value Ref Range Status   Specimen Description   Final    BLOOD RIGHT WRIST Performed at [facility] [address] [place], [place] [address]    Special Requests   Final    BOTTLES DRAWN AEROBIC AND ANAEROBIC Blood Culture adequate volume Performed at [facility] [address],  [place], [place] [address]    Culture  Setup Time   Final    GRAM NEGATIVE RODS IN BOTH AEROBIC AND ANAEROBIC BOTTLES CRITICAL VALUE NOTED.  VALUE IS CONSISTENT WITH PREVIOUSLY REPORTED AND CALLED VALUE.    Culture (A)  Final    ESCHERICHIA COLI SUSCEPTIBILITIES PERFORMED ON PREVIOUS CULTURE WITHIN THE LAST 5 DAYS. Performed at [facility], [place] [address], [place], [place] [address]    Report Status 07/19/2021 FINAL  Final  Culture, blood (Routine x 2)     Status: Abnormal   Collection Time: 07/16/21  5:25 PM  Specimen: BLOOD  Result Value Ref Range Status   Specimen Description   Final    BLOOD BLOOD RIGHT FOREARM Performed at [facility] [address], [place], [place] [address]    Special Requests   Final    BOTTLES DRAWN AEROBIC AND ANAEROBIC Blood Culture adequate volume Performed at [facility] [address] [place], [place] [address]    Culture  Setup Time   Final    GRAM NEGATIVE RODS IN BOTH AEROBIC AND ANAEROBIC BOTTLES CRITICAL RESULT CALLED TO, READ BACK BY AND VERIFIED WITH: Vena Austria D.WOFFORD ON [id number] AT 0272 BY E.PARRISH Performed at [facility], [place] [address] [place], [place] [address]    Culture ESCHERICHIA COLI (A)  Final   Report Status 07/19/2021 FINAL  Final   Organism ID, Bacteria ESCHERICHIA COLI  Final      Susceptibility   Escherichia coli - MIC*    AMPICILLIN >=32 RESISTANT Resistant     CEFAZOLIN 8 SENSITIVE Sensitive     CEFEPIME <=0.12 SENSITIVE Sensitive     CEFTAZIDIME <=1 SENSITIVE Sensitive     CEFTRIAXONE <=0.25 SENSITIVE Sensitive     CIPROFLOXACIN 0.5 INTERMEDIATE Intermediate     GENTAMICIN <=1 SENSITIVE Sensitive     IMIPENEM <=0.25 SENSITIVE Sensitive     TRIMETH/SULFA >=320 RESISTANT Resistant     AMPICILLIN/SULBACTAM >=32 RESISTANT Resistant     PIP/TAZO 64 INTERMEDIATE Intermediate     * ESCHERICHIA COLI  Blood Culture ID Panel (Reflexed)     Status: Abnormal    Collection Time: 07/16/21  5:25 PM  Result Value Ref Range Status   Enterococcus faecalis NOT DETECTED NOT DETECTED Final   Enterococcus Faecium NOT DETECTED NOT DETECTED Final   Listeria monocytogenes NOT DETECTED NOT DETECTED Final   Staphylococcus species NOT DETECTED NOT DETECTED Final   Staphylococcus aureus (BCID) NOT DETECTED NOT DETECTED Final   Staphylococcus epidermidis NOT DETECTED NOT DETECTED Final   Staphylococcus lugdunensis NOT DETECTED NOT DETECTED Final   Streptococcus species NOT DETECTED NOT DETECTED Final   Streptococcus agalactiae NOT DETECTED NOT DETECTED Final   Streptococcus pneumoniae NOT DETECTED NOT DETECTED Final   Streptococcus pyogenes NOT DETECTED NOT DETECTED Final   A.calcoaceticus-baumannii NOT DETECTED NOT DETECTED Final   Bacteroides fragilis NOT DETECTED NOT DETECTED Final   Enterobacterales DETECTED (A) NOT DETECTED Final    Comment: Enterobacterales represent a large order of gram negative bacteria, not a single organism. CRITICAL RESULT CALLED TO, READ BACK BY AND VERIFIED WITH: PHARM D D.WOFFORD ON [id number] AT 9563 BY E.PARRISH    Enterobacter cloacae complex NOT DETECTED NOT DETECTED Final   Escherichia coli DETECTED (A) NOT DETECTED Final    Comment: CRITICAL RESULT CALLED TO, READ BACK BY AND VERIFIED WITH: PHARM D D.WOFFORD ON [id number] AT 1032 BY E.PARRISH    Klebsiella aerogenes NOT DETECTED NOT DETECTED Final   Klebsiella oxytoca NOT DETECTED NOT DETECTED Final   Klebsiella pneumoniae NOT DETECTED NOT DETECTED Final   Proteus species NOT DETECTED NOT DETECTED Final   Salmonella species NOT DETECTED NOT DETECTED Final   Serratia marcescens NOT DETECTED NOT DETECTED Final   Haemophilus influenzae NOT DETECTED NOT DETECTED Final   Neisseria meningitidis NOT DETECTED NOT DETECTED Final   Pseudomonas aeruginosa NOT DETECTED NOT DETECTED Final   Stenotrophomonas maltophilia NOT DETECTED NOT DETECTED Final   Candida albicans NOT DETECTED NOT  DETECTED Final   Candida auris NOT DETECTED NOT DETECTED Final   Candida glabrata NOT  DETECTED NOT DETECTED Final   Candida krusei NOT DETECTED NOT DETECTED Final   Candida parapsilosis NOT DETECTED NOT DETECTED Final   Candida tropicalis NOT DETECTED NOT DETECTED Final   Cryptococcus neoformans/gattii NOT DETECTED NOT DETECTED Final   CTX-M ESBL NOT DETECTED NOT DETECTED Final   Carbapenem resistance IMP NOT DETECTED NOT DETECTED Final   Carbapenem resistance KPC NOT DETECTED NOT DETECTED Final   Carbapenem resistance NDM NOT DETECTED NOT DETECTED Final   Carbapenem resist OXA 48 LIKE NOT DETECTED NOT DETECTED Final   Carbapenem resistance VIM NOT DETECTED NOT DETECTED Final    Comment: Performed at [facility], [place] [address], [place], [place] [address]  Resp Panel by RT-PCR (Flu A&B, Covid) Nasopharyngeal Swab     Status: None   Collection Time: 07/16/21  5:45 PM   Specimen: Nasopharyngeal Swab; Nasopharyngeal(NP) swabs in vial transport medium  Result Value Ref Range Status   SARS Coronavirus 2 by RT PCR NEGATIVE NEGATIVE Final    Comment: (NOTE) SARS-CoV-2 target nucleic acids are NOT DETECTED.  The SARS-CoV-2 RNA is generally detectable in upper respiratory specimens during the acute phase of infection. The lowest concentration of SARS-CoV-2 viral copies this assay can detect is 138 copies/mL. A negative result does not preclude SARS-Cov-2 infection and should not be used as the sole basis for treatment or other patient management decisions. A negative result may occur with  improper specimen collection/handling, submission of specimen other than nasopharyngeal swab, presence of viral mutation(s) within the areas targeted by this assay, and inadequate number of viral copies(<138 copies/mL). A negative result must be combined with clinical observations, patient history, and epidemiological information. The expected result is Negative.  Fact Sheet for Patients:   EntrepreneurPulse.com.au  Fact Sheet for Healthcare Providers:  IncredibleEmployment.be  This test is no t yet approved or cleared by the [place] FDA and  has been authorized for detection and/or diagnosis of SARS-CoV-2 by FDA under an Emergency Use Authorization (EUA). This EUA will remain  in effect (meaning this test can be used) for the duration of the COVID-19 declaration under Section 564(b)(1) of the Act, 21 U.S.C.section 360bbb-3(b)(1), unless the authorization is terminated  or revoked sooner.       Influenza A by PCR NEGATIVE NEGATIVE Final   Influenza B by PCR NEGATIVE NEGATIVE Final    Comment: (NOTE) The Xpert Xpress SARS-CoV-2/FLU/RSV plus assay is intended as an aid in the diagnosis of influenza from Nasopharyngeal swab specimens and should not be used as a sole basis for treatment. Nasal washings and aspirates are unacceptable for Xpert Xpress SARS-CoV-2/FLU/RSV testing.  Fact Sheet for Patients: EntrepreneurPulse.com.au  Fact Sheet for Healthcare Providers: IncredibleEmployment.be  This test is not yet approved or cleared by the [place] FDA and has been authorized for detection and/or diagnosis of SARS-CoV-2 by FDA under an Emergency Use Authorization (EUA). This EUA will remain in effect (meaning this test can be used) for the duration of the COVID-19 declaration under Section 564(b)(1) of the Act, 21 U.S.C. section 360bbb-3(b)(1), unless the authorization is terminated or revoked.  Performed at [facility], [place] [address], [place], [place] [address]   Urine Culture     Status: Abnormal   Collection Time: 07/16/21  7:03 PM   Specimen: Urine, Clean Catch  Result Value Ref Range Status   Specimen Description   Final    URINE, CLEAN CATCH Performed at [facility], [place] [address], [place], [place] [address]  Special Requests    Final    NONE Performed at [facility], [place] [address] [place], [place] [address]    Culture (A)  Final    2,000 COLONIES/mL GROUP B STREP(S.AGALACTIAE)ISOLATED TESTING AGAINST S. AGALACTIAE NOT ROUTINELY PERFORMED DUE TO PREDICTABILITY OF AMP/PEN/VAN SUSCEPTIBILITY. Performed at [facility], [place] [address], [place], [place] [address]    Report Status 07/18/2021 FINAL  Final  Culture, blood (routine x 2)     Status: None (Preliminary result)   Collection Time: 07/19/21  9:03 PM   Specimen: BLOOD RIGHT HAND  Result Value Ref Range Status   Specimen Description   Final    BLOOD RIGHT HAND Performed at [facility] [address] [place], [place] [address]    Special Requests   Final    BOTTLES DRAWN AEROBIC ONLY Blood Culture adequate volume Performed at [facility] [address], [place], [place] [address]    Culture   Final    NO GROWTH 2 DAYS Performed at [facility] [address], [place], [place] [address]    Report Status PENDING  Incomplete  Culture, blood (routine x 2)     Status: None (Preliminary result)   Collection Time: 07/19/21  9:03 PM   Specimen: Right Antecubital; Blood  Result Value Ref Range Status   Specimen Description   Final    RIGHT ANTECUBITAL Performed at [facility] [address] [place] [address] [place], [place] [address]    Special Requests   Final    BOTTLES DRAWN AEROBIC ONLY Blood Culture adequate volume Performed at [facility] [address], [place], [place] [address]    Culture   Final    NO GROWTH 2 DAYS Performed at [facility] [address] [place], [place] [address]    Report Status PENDING  Incomplete     Labs: BNP (last 3 results) No results for input(s): BNP in the last 8760 hours. Basic Metabolic Panel: Recent Labs  Lab 07/16/21 1720 07/17/21 0515 07/19/21 0420 07/20/21 0409 07/21/21 0417  NA 135 133*  134* 134* 135  K 3.4* 4.0 4.1 4.0 4.1  CL 106 106 111 109 107  CO2 20* 23 19* 19* 20*  GLUCOSE 115* 132* 124* 125* 115*  BUN $Re'19 12 10 12 16  'FiA$ CREATININE 1.19* 0.91 0.76 0.61 0.58  CALCIUM 9.3 8.3* 8.7* 8.9 9.7  MG  --   --  1.4* 2.0 1.6*   Liver Function Tests: Recent Labs  Lab 07/16/21 1720 07/17/21 0515  AST 249* 69*  ALT 148* 103*  ALKPHOS 167* 115  BILITOT 0.9 0.9  PROT 6.9 6.0*  ALBUMIN 3.9 3.2*   No results for input(s): LIPASE, AMYLASE in the last 168 hours. No results for input(s): AMMONIA in the last 168 hours. CBC: Recent Labs  Lab 07/16/21 1720 07/17/21 0515 07/18/21 0403 07/19/21 0420 07/20/21 0409 07/21/21 0417  WBC 16.2* 24.7* 15.9* 11.4* 9.9 7.6  NEUTROABS 15.3*  --  13.0*  --   --   --   HGB 12.9 12.2 11.5* 11.5* 11.6* 12.5  HCT 38.6 36.7 34.8* 34.4* 36.0 36.5  MCV 85.2 86.6 86.4 86.0 88.2 84.1  PLT 177 142* 135* 130* 156 196   Cardiac Enzymes: No results for input(s): CKTOTAL, CKMB, CKMBINDEX, TROPONINI in the last 168 hours. BNP: Invalid input(s): POCBNP CBG: No results for input(s): GLUCAP in the last 168 hours. D-Dimer No results for input(s):  DDIMER in the last 72 hours. Hgb A1c No results for input(s): HGBA1C in the last 72 hours. Lipid Profile No results for input(s): CHOL, HDL, LDLCALC, TRIG, CHOLHDL, LDLDIRECT in the last 72 hours. Thyroid function studies No results for input(s): TSH, T4TOTAL, T3FREE, THYROIDAB in the last 72 hours.  Invalid input(s): FREET3 Anemia work up No results for input(s): VITAMINB12, FOLATE, FERRITIN, TIBC, IRON, RETICCTPCT in the last 72 hours. Urinalysis    Component Value Date/Time   COLORURINE STRAW (A) 07/16/2021 1903   APPEARANCEUR CLEAR 07/16/2021 1903   LABSPEC 1.009 07/16/2021 1903   PHURINE 8.0 07/16/2021 1903   GLUCOSEU NEGATIVE 07/16/2021 1903   HGBUR NEGATIVE 07/16/2021 1903   BILIRUBINUR NEGATIVE 07/16/2021 1903   KETONESUR NEGATIVE 07/16/2021 1903   PROTEINUR NEGATIVE 07/16/2021 1903    UROBILINOGEN 0.2 12/09/2013 1556   NITRITE NEGATIVE 07/16/2021 1903   LEUKOCYTESUR TRACE (A) 07/16/2021 1903   Sepsis Labs Invalid input(s): PROCALCITONIN,  WBC,  LACTICIDVEN Microbiology Recent Results (from the past 240 hour(s))  Culture, blood (Routine x 2)     Status: Abnormal   Collection Time: 07/16/21  5:20 PM   Specimen: BLOOD  Result Value Ref Range Status   Specimen Description   Final    BLOOD RIGHT WRIST Performed at [facility], [place] [address] [place], [place] [address]    Special Requests   Final    BOTTLES DRAWN AEROBIC AND ANAEROBIC Blood Culture adequate volume Performed at [place] [address] [place], [place] [address]    Culture  Setup Time   Final    GRAM NEGATIVE RODS IN BOTH AEROBIC AND ANAEROBIC BOTTLES CRITICAL VALUE NOTED.  VALUE IS CONSISTENT WITH PREVIOUSLY REPORTED AND CALLED VALUE.    Culture (A)  Final    ESCHERICHIA COLI SUSCEPTIBILITIES PERFORMED ON PREVIOUS CULTURE WITHIN THE LAST 5 DAYS. Performed at [facility], [place] [address], [place], [place] [address]    Report Status 07/19/2021 FINAL  Final  Culture, blood (Routine x 2)     Status: Abnormal   Collection Time: 07/16/21  5:25 PM   Specimen: BLOOD  Result Value Ref Range Status   Specimen Description   Final    BLOOD BLOOD RIGHT FOREARM Performed at [place] [address], [place],  [address]    Special Requests   Final    BOTTLES DRAWN AEROBIC AND ANAEROBIC Blood Culture adequate volume Performed at [place] [address] [place], [place] [address]    Culture  Setup Time   Final    GRAM NEGATIVE RODS IN BOTH AEROBIC AND ANAEROBIC BOTTLES CRITICAL RESULT CALLED TO, READ BACK BY AND VERIFIED WITH: Vena Austria D.WOFFORD ON [id number] AT 7416 BY E.PARRISH Performed at [facility], [place] [address] [place], [place] [address]    Culture ESCHERICHIA COLI (A)  Final    Report Status 07/19/2021 FINAL  Final   Organism ID, Bacteria ESCHERICHIA COLI  Final      Susceptibility   Escherichia coli - MIC*    AMPICILLIN >=32 RESISTANT Resistant     CEFAZOLIN 8 SENSITIVE Sensitive     CEFEPIME <=0.12 SENSITIVE Sensitive     CEFTAZIDIME <=1 SENSITIVE Sensitive     CEFTRIAXONE <=0.25 SENSITIVE Sensitive     CIPROFLOXACIN 0.5 INTERMEDIATE Intermediate     GENTAMICIN <=1 SENSITIVE Sensitive     IMIPENEM <=0.25 SENSITIVE Sensitive     TRIMETH/SULFA >=320 RESISTANT Resistant  AMPICILLIN/SULBACTAM >=32 RESISTANT Resistant     PIP/TAZO 64 INTERMEDIATE Intermediate     * ESCHERICHIA COLI  Blood Culture ID Panel (Reflexed)     Status: Abnormal   Collection Time: 07/16/21  5:25 PM  Result Value Ref Range Status   Enterococcus faecalis NOT DETECTED NOT DETECTED Final   Enterococcus Faecium NOT DETECTED NOT DETECTED Final   Listeria monocytogenes NOT DETECTED NOT DETECTED Final   Staphylococcus species NOT DETECTED NOT DETECTED Final   Staphylococcus aureus (BCID) NOT DETECTED NOT DETECTED Final   Staphylococcus epidermidis NOT DETECTED NOT DETECTED Final   Staphylococcus lugdunensis NOT DETECTED NOT DETECTED Final   Streptococcus species NOT DETECTED NOT DETECTED Final   Streptococcus agalactiae NOT DETECTED NOT DETECTED Final   Streptococcus pneumoniae NOT DETECTED NOT DETECTED Final   Streptococcus pyogenes NOT DETECTED NOT DETECTED Final   A.calcoaceticus-baumannii NOT DETECTED NOT DETECTED Final   Bacteroides fragilis NOT DETECTED NOT DETECTED Final   Enterobacterales DETECTED (A) NOT DETECTED Final    Comment: Enterobacterales represent a large order of gram negative bacteria, not a single organism. CRITICAL RESULT CALLED TO, READ BACK BY AND VERIFIED WITH: PHARM D D.WOFFORD ON [id number] AT 9390 BY E.PARRISH    Enterobacter cloacae complex NOT DETECTED NOT DETECTED Final   Escherichia coli DETECTED (A) NOT DETECTED Final    Comment: CRITICAL RESULT CALLED  TO, READ BACK BY AND VERIFIED WITH: PHARM D D.WOFFORD ON [id number] AT 1032 BY E.PARRISH    Klebsiella aerogenes NOT DETECTED NOT DETECTED Final   Klebsiella oxytoca NOT DETECTED NOT DETECTED Final   Klebsiella pneumoniae NOT DETECTED NOT DETECTED Final   Proteus species NOT DETECTED NOT DETECTED Final   Salmonella species NOT DETECTED NOT DETECTED Final   Serratia marcescens NOT DETECTED NOT DETECTED Final   Haemophilus influenzae NOT DETECTED NOT DETECTED Final   Neisseria meningitidis NOT DETECTED NOT DETECTED Final   Pseudomonas aeruginosa NOT DETECTED NOT DETECTED Final   Stenotrophomonas maltophilia NOT DETECTED NOT DETECTED Final   Candida albicans NOT DETECTED NOT DETECTED Final   Candida auris NOT DETECTED NOT DETECTED Final   Candida glabrata NOT DETECTED NOT DETECTED Final   Candida krusei NOT DETECTED NOT DETECTED Final   Candida parapsilosis NOT DETECTED NOT DETECTED Final   Candida tropicalis NOT DETECTED NOT DETECTED Final   Cryptococcus neoformans/gattii NOT DETECTED NOT DETECTED Final   CTX-M ESBL NOT DETECTED NOT DETECTED Final   Carbapenem resistance IMP NOT DETECTED NOT DETECTED Final   Carbapenem resistance KPC NOT DETECTED NOT DETECTED Final   Carbapenem resistance NDM NOT DETECTED NOT DETECTED Final   Carbapenem resist OXA 48 LIKE NOT DETECTED NOT DETECTED Final   Carbapenem resistance VIM NOT DETECTED NOT DETECTED Final    Comment: Performed at [facility], [address] [place], [place] [address]  Resp Panel by RT-PCR (Flu A&B, Covid) Nasopharyngeal Swab     Status: None   Collection Time: 07/16/21  5:45 PM   Specimen: Nasopharyngeal Swab; Nasopharyngeal(NP) swabs in vial transport medium  Result Value Ref Range Status   SARS Coronavirus 2 by RT PCR NEGATIVE NEGATIVE Final    Comment: (NOTE) SARS-CoV-2 target nucleic acids are NOT DETECTED.  The SARS-CoV-2 RNA is generally detectable in upper respiratory specimens during the acute phase of  infection. The lowest concentration of SARS-CoV-2 viral copies this assay can detect is 138 copies/mL. A negative result does not preclude SARS-Cov-2 infection and should not be used as the sole basis for treatment or other patient  management decisions. A negative result may occur with  improper specimen collection/handling, submission of specimen other than nasopharyngeal swab, presence of viral mutation(s) within the areas targeted by this assay, and inadequate number of viral copies(<138 copies/mL). A negative result must be combined with clinical observations, patient history, and epidemiological information. The expected result is Negative.  Fact Sheet for Patients:  EntrepreneurPulse.com.au  Fact Sheet for Healthcare Providers:  IncredibleEmployment.be  This test is no t yet approved or cleared by the [place] FDA and  has been authorized for detection and/or diagnosis of SARS-CoV-2 by FDA under an Emergency Use Authorization (EUA). This EUA will remain  in effect (meaning this test can be used) for the duration of the COVID-19 declaration under Section 564(b)(1) of the Act, 21 U.S.C.section 360bbb-3(b)(1), unless the authorization is terminated  or revoked sooner.       Influenza A by PCR NEGATIVE NEGATIVE Final   Influenza B by PCR NEGATIVE NEGATIVE Final    Comment: (NOTE) The Xpert Xpress SARS-CoV-2/FLU/RSV plus assay is intended as an aid in the diagnosis of influenza from Nasopharyngeal swab specimens and should not be used as a sole basis for treatment. Nasal washings and aspirates are unacceptable for Xpert Xpress SARS-CoV-2/FLU/RSV testing.  Fact Sheet for Patients: EntrepreneurPulse.com.au  Fact Sheet for Healthcare Providers: IncredibleEmployment.be  This test is not yet approved or cleared by the [place] FDA and has been authorized for detection and/or diagnosis of SARS-CoV-2  by FDA under an Emergency Use Authorization (EUA). This EUA will remain in effect (meaning this test can be used) for the duration of the COVID-19 declaration under Section 564(b)(1) of the Act, 21 U.S.C. section 360bbb-3(b)(1), unless the authorization is terminated or revoked.  Performed at [facility], [place] [address], [place], [place] [address]   Urine Culture     Status: Abnormal   Collection Time: 07/16/21  7:03 PM   Specimen: Urine, Clean Catch  Result Value Ref Range Status   Specimen Description   Final    URINE, CLEAN CATCH Performed at [facility], [place] [address], [place], [place] [address]    Special Requests   Final    NONE Performed at [facility], [place] [address] [place], [place] [address]    Culture (A)  Final    2,000 COLONIES/mL GROUP B STREP(S.AGALACTIAE)ISOLATED TESTING AGAINST S. AGALACTIAE NOT ROUTINELY PERFORMED DUE TO PREDICTABILITY OF AMP/PEN/VAN SUSCEPTIBILITY. Performed at [facility], [place] [address], [place], [place] [address]    Report Status 07/18/2021 FINAL  Final  Culture, blood (routine x 2)     Status: None (Preliminary result)   Collection Time: 07/19/21  9:03 PM   Specimen: BLOOD RIGHT HAND  Result Value Ref Range Status   Specimen Description   Final    BLOOD RIGHT HAND Performed at [place] [address], [place], [place] [address]    Special Requests   Final    BOTTLES DRAWN AEROBIC ONLY Blood Culture adequate volume Performed at [place] [address] [place], [place] [address]    Culture   Final    NO GROWTH 2 DAYS Performed at [facility] [address], [place], [place] [address]    Report Status PENDING  Incomplete  Culture, blood (routine x 2)     Status: None (Preliminary result)   Collection Time: 07/19/21  9:03 PM   Specimen: Right Antecubital; Blood  Result Value Ref Range Status  Specimen  Description   Final    RIGHT ANTECUBITAL Performed at [facility] [address] [place], [place] [address]    Special Requests   Final    BOTTLES DRAWN AEROBIC ONLY Blood Culture adequate volume Performed at [place] [address], [place], [place] [address]    Culture   Final    NO GROWTH 2 DAYS Performed at [facility] [facility] [address], [place], [place] [address]    Report Status PENDING  Incomplete     Time coordinating discharge:  I have spent 35 minutes face to face with the patient and on the ward discussing the patients care, assessment, plan and disposition with other care givers. >50% of the time was devoted counseling the patient about the risks and benefits of treatment/Discharge disposition and coordinating care.   SIGNED:   Damita Lack, MD  [facility] 07/21/2021, 12:10 PM   If 7PM-7AM, please contact night-coverage

## 2021-07-24 LAB — CULTURE, BLOOD (ROUTINE X 2)
Culture: NO GROWTH
Culture: NO GROWTH
Special Requests: ADEQUATE
Special Requests: ADEQUATE

## 2021-08-02 ENCOUNTER — Inpatient Hospital Stay: Payer: BLUE CROSS/BLUE SHIELD | Admitting: Infectious Diseases

## 2021-08-11 ENCOUNTER — Inpatient Hospital Stay: Payer: BLUE CROSS/BLUE SHIELD | Admitting: Infectious Diseases

## 2021-09-20 ENCOUNTER — Encounter: Payer: Self-pay | Admitting: Radiology

## 2021-09-20 DIAGNOSIS — Z0289 Encounter for other administrative examinations: Secondary | ICD-10-CM

## 2021-10-10 ENCOUNTER — Inpatient Hospital Stay (HOSPITAL_COMMUNITY)
Admission: EM | Admit: 2021-10-10 | Discharge: 2021-10-13 | DRG: 698 | Disposition: A | Discharge status: Home / Self Care | Payer: BLUE CROSS/BLUE SHIELD | Attending: Family Medicine | Admitting: Family Medicine

## 2021-10-10 ENCOUNTER — Emergency Department (HOSPITAL_COMMUNITY): Payer: BLUE CROSS/BLUE SHIELD

## 2021-10-10 ENCOUNTER — Other Ambulatory Visit: Payer: Self-pay

## 2021-10-10 ENCOUNTER — Encounter (HOSPITAL_COMMUNITY): Payer: Self-pay | Admitting: Emergency Medicine

## 2021-10-10 DIAGNOSIS — R509 Fever, unspecified: Secondary | ICD-10-CM

## 2021-10-10 DIAGNOSIS — R6510 Systemic inflammatory response syndrome (SIRS) of non-infectious origin without acute organ dysfunction: Secondary | ICD-10-CM | POA: Diagnosis not present

## 2021-10-10 DIAGNOSIS — Z7952 Long term (current) use of systemic steroids: Secondary | ICD-10-CM

## 2021-10-10 DIAGNOSIS — T8613 Kidney transplant infection: Secondary | ICD-10-CM | POA: Diagnosis not present

## 2021-10-10 DIAGNOSIS — N2581 Secondary hyperparathyroidism of renal origin: Secondary | ICD-10-CM | POA: Diagnosis present

## 2021-10-10 DIAGNOSIS — D849 Immunodeficiency, unspecified: Secondary | ICD-10-CM

## 2021-10-10 DIAGNOSIS — Z79624 Long term (current) use of inhibitors of nucleotide synthesis: Secondary | ICD-10-CM

## 2021-10-10 DIAGNOSIS — Y830 Surgical operation with transplant of whole organ as the cause of abnormal reaction of the patient, or of later complication, without mention of misadventure at the time of the procedure: Secondary | ICD-10-CM | POA: Diagnosis present

## 2021-10-10 DIAGNOSIS — N12 Tubulo-interstitial nephritis, not specified as acute or chronic: Secondary | ICD-10-CM | POA: Diagnosis present

## 2021-10-10 DIAGNOSIS — M791 Myalgia, unspecified site: Secondary | ICD-10-CM

## 2021-10-10 DIAGNOSIS — A419 Sepsis, unspecified organism: Secondary | ICD-10-CM | POA: Diagnosis present

## 2021-10-10 DIAGNOSIS — Z940 Kidney transplant status: Secondary | ICD-10-CM

## 2021-10-10 DIAGNOSIS — R5381 Other malaise: Secondary | ICD-10-CM | POA: Diagnosis present

## 2021-10-10 DIAGNOSIS — F419 Anxiety disorder, unspecified: Secondary | ICD-10-CM | POA: Diagnosis present

## 2021-10-10 DIAGNOSIS — Z20822 Contact with and (suspected) exposure to covid-19: Secondary | ICD-10-CM | POA: Diagnosis present

## 2021-10-10 DIAGNOSIS — T8619 Other complication of kidney transplant: Secondary | ICD-10-CM | POA: Diagnosis present

## 2021-10-10 DIAGNOSIS — Z79899 Other long term (current) drug therapy: Secondary | ICD-10-CM

## 2021-10-10 DIAGNOSIS — I259 Chronic ischemic heart disease, unspecified: Secondary | ICD-10-CM | POA: Diagnosis present

## 2021-10-10 LAB — URINALYSIS, ROUTINE W REFLEX MICROSCOPIC
Bacteria, UA: NONE SEEN
Bilirubin Urine: NEGATIVE
Glucose, UA: NEGATIVE mg/dL
Hgb urine dipstick: NEGATIVE
Ketones, ur: 20 mg/dL — AB
Leukocytes,Ua: NEGATIVE
Nitrite: NEGATIVE
Protein, ur: 30 mg/dL — AB
Specific Gravity, Urine: 1.021 (ref 1.005–1.030)
pH: 7 (ref 5.0–8.0)

## 2021-10-10 LAB — CBC WITH DIFFERENTIAL/PLATELET
Abs Immature Granulocytes: 0.08 10*3/uL — ABNORMAL HIGH (ref 0.00–0.07)
Basophils Absolute: 0 10*3/uL (ref 0.0–0.1)
Basophils Relative: 0 %
Eosinophils Absolute: 0 10*3/uL (ref 0.0–0.5)
Eosinophils Relative: 0 %
HCT: 41.1 % (ref 36.0–46.0)
Hemoglobin: 13.6 g/dL (ref 12.0–15.0)
Immature Granulocytes: 1 %
Lymphocytes Relative: 8 %
Lymphs Abs: 1.3 10*3/uL (ref 0.7–4.0)
MCH: 28.7 pg (ref 26.0–34.0)
MCHC: 33.1 g/dL (ref 30.0–36.0)
MCV: 86.7 fL (ref 80.0–100.0)
Monocytes Absolute: 1 10*3/uL (ref 0.1–1.0)
Monocytes Relative: 6 %
Neutro Abs: 13.3 10*3/uL — ABNORMAL HIGH (ref 1.7–7.7)
Neutrophils Relative %: 85 %
Platelets: 178 10*3/uL (ref 150–400)
RBC: 4.74 MIL/uL (ref 3.87–5.11)
RDW: 13.7 % (ref 11.5–15.5)
WBC: 15.7 10*3/uL — ABNORMAL HIGH (ref 4.0–10.5)
nRBC: 0 % (ref 0.0–0.2)

## 2021-10-10 LAB — COMPREHENSIVE METABOLIC PANEL
ALT: 30 U/L (ref 0–44)
AST: 28 U/L (ref 15–41)
Albumin: 4.1 g/dL (ref 3.5–5.0)
Alkaline Phosphatase: 106 U/L (ref 38–126)
Anion gap: 9 (ref 5–15)
BUN: 16 mg/dL (ref 6–20)
CO2: 18 mmol/L — ABNORMAL LOW (ref 22–32)
Calcium: 9.2 mg/dL (ref 8.9–10.3)
Chloride: 105 mmol/L (ref 98–111)
Creatinine, Ser: 0.87 mg/dL (ref 0.44–1.00)
GFR, Estimated: >60 mL/min (ref >60)
Glucose, Bld: 116 mg/dL — ABNORMAL HIGH (ref 70–99)
Potassium: 3.7 mmol/L (ref 3.5–5.1)
Sodium: 132 mmol/L — ABNORMAL LOW (ref 135–145)
Total Bilirubin: 0.4 mg/dL (ref 0.3–1.2)
Total Protein: 7.4 g/dL (ref 6.5–8.1)

## 2021-10-10 LAB — I-STAT BETA HCG BLOOD, ED (MC, WL, AP ONLY): I-stat hCG, quantitative: <5 m[IU]/mL (ref <5)

## 2021-10-10 LAB — RESP PANEL BY RT-PCR (FLU A&B, COVID) ARPGX2
Influenza A by PCR: NEGATIVE
Influenza B by PCR: NEGATIVE
SARS Coronavirus 2 by RT PCR: NEGATIVE

## 2021-10-10 LAB — LACTIC ACID, PLASMA: Lactic Acid, Venous: 0.7 mmol/L (ref 0.5–1.9)

## 2021-10-10 MED ORDER — CEFTRIAXONE SODIUM 1 G IJ SOLR
1.0000 g | Freq: Once | INTRAMUSCULAR | Status: AC
Start: 2021-10-10 — End: 2021-10-11
  Administered 2021-10-10: 1 g via INTRAVENOUS
  Filled 2021-10-10 (×2): qty 10

## 2021-10-10 MED ORDER — SODIUM CHLORIDE 0.9 % IV SOLN: Freq: Once | INTRAVENOUS | Status: AC

## 2021-10-10 MED ORDER — HYDROMORPHONE HCL 1 MG/ML IJ SOLN
0.5000 mg | Freq: Once | INTRAMUSCULAR | Status: AC
  Administered 2021-10-10: 0.5 mg via INTRAVENOUS
  Filled 2021-10-10: qty 1

## 2021-10-10 MED ORDER — ACETAMINOPHEN 325 MG PO TABS
650.0000 mg | ORAL_TABLET | Freq: Once | ORAL | Status: AC
Start: 2021-10-10 — End: 2021-10-10
  Administered 2021-10-10: 650 mg via ORAL
  Filled 2021-10-10: qty 2

## 2021-10-10 MED ORDER — SODIUM CHLORIDE 0.9 % IV BOLUS
1000.0000 mL | Freq: Once | INTRAVENOUS | Status: AC
  Administered 2021-10-10: 1000 mL via INTRAVENOUS

## 2021-10-10 NOTE — ED Notes (Signed)
Pt appears uncomfortable, a/ox4, c/o generalized weakness, bodyaches, sore throat x today.

## 2021-10-10 NOTE — ED Triage Notes (Signed)
Pt states she has whole body aches that began today. Pt states she has not been around anyone who has been sick. Pt has hx of kidney transplant in 2019.

## 2021-10-11 ENCOUNTER — Encounter (HOSPITAL_COMMUNITY): Payer: Self-pay | Admitting: Family Medicine

## 2021-10-11 ENCOUNTER — Observation Stay (HOSPITAL_COMMUNITY): Payer: BLUE CROSS/BLUE SHIELD

## 2021-10-11 DIAGNOSIS — D849 Immunodeficiency, unspecified: Secondary | ICD-10-CM | POA: Diagnosis not present

## 2021-10-11 DIAGNOSIS — N12 Tubulo-interstitial nephritis, not specified as acute or chronic: Secondary | ICD-10-CM | POA: Diagnosis not present

## 2021-10-11 DIAGNOSIS — Z940 Kidney transplant status: Secondary | ICD-10-CM | POA: Diagnosis not present

## 2021-10-11 DIAGNOSIS — T8619 Other complication of kidney transplant: Secondary | ICD-10-CM

## 2021-10-11 DIAGNOSIS — R6510 Systemic inflammatory response syndrome (SIRS) of non-infectious origin without acute organ dysfunction: Secondary | ICD-10-CM | POA: Insufficient documentation

## 2021-10-11 LAB — BASIC METABOLIC PANEL
Anion gap: 6 (ref 5–15)
BUN: 11 mg/dL (ref 6–20)
CO2: 20 mmol/L — ABNORMAL LOW (ref 22–32)
Calcium: 8.6 mg/dL — ABNORMAL LOW (ref 8.9–10.3)
Chloride: 107 mmol/L (ref 98–111)
Creatinine, Ser: 0.83 mg/dL (ref 0.44–1.00)
GFR, Estimated: >60 mL/min (ref >60)
Glucose, Bld: 100 mg/dL — ABNORMAL HIGH (ref 70–99)
Potassium: 3.5 mmol/L (ref 3.5–5.1)
Sodium: 133 mmol/L — ABNORMAL LOW (ref 135–145)

## 2021-10-11 LAB — MONONUCLEOSIS SCREEN: Mono Screen: NEGATIVE

## 2021-10-11 LAB — GROUP A STREP BY PCR: Group A Strep by PCR: NOT DETECTED

## 2021-10-11 MED ORDER — SODIUM CHLORIDE 0.9 % IV SOLN: 2.0000 g | INTRAVENOUS | Status: DC

## 2021-10-11 MED ORDER — HYDROMORPHONE HCL 1 MG/ML IJ SOLN
1.0000 mg | INTRAMUSCULAR | Status: DC | PRN
Start: 2021-10-11 — End: 2021-10-11
  Administered 2021-10-11: 1 mg via INTRAVENOUS
  Filled 2021-10-11: qty 1

## 2021-10-11 MED ORDER — TACROLIMUS 1 MG PO CAPS
6.0000 mg | ORAL_CAPSULE | Freq: Every day | ORAL | Status: DC
  Administered 2021-10-11 – 2021-10-12 (×2): 6 mg via ORAL
  Filled 2021-10-11 (×2): qty 6

## 2021-10-11 MED ORDER — TACROLIMUS 1 MG PO CAPS
7.0000 mg | ORAL_CAPSULE | Freq: Every day | ORAL | Status: DC
  Administered 2021-10-11 – 2021-10-13 (×3): 7 mg via ORAL
  Filled 2021-10-11 (×3): qty 7

## 2021-10-11 MED ORDER — SODIUM CHLORIDE 0.9 % IV SOLN
2.0000 g | INTRAVENOUS | Status: DC
  Administered 2021-10-11 – 2021-10-13 (×2): 2 g via INTRAVENOUS
  Filled 2021-10-11 (×2): qty 20

## 2021-10-11 MED ORDER — MYCOPHENOLATE SODIUM 180 MG PO TBEC
180.0000 mg | DELAYED_RELEASE_TABLET | Freq: Two times a day (BID) | ORAL | Status: DC
  Administered 2021-10-11 – 2021-10-13 (×4): 180 mg via ORAL
  Filled 2021-10-11 (×4): qty 1

## 2021-10-11 MED ORDER — HYDROMORPHONE HCL 1 MG/ML IJ SOLN
1.0000 mg | INTRAMUSCULAR | Status: DC | PRN
  Administered 2021-10-11: 1 mg via INTRAVENOUS
  Filled 2021-10-11: qty 1

## 2021-10-11 MED ORDER — MENTHOL 3 MG MT LOZG
1.0000 | LOZENGE | OROMUCOSAL | Status: DC | PRN
  Filled 2021-10-11: qty 9

## 2021-10-11 MED ORDER — SODIUM CHLORIDE (PF) 0.9 % IJ SOLN
INTRAMUSCULAR | Status: AC
  Filled 2021-10-11: qty 50

## 2021-10-11 MED ORDER — ACETAMINOPHEN 325 MG PO TABS
650.0000 mg | ORAL_TABLET | Freq: Four times a day (QID) | ORAL | Status: DC | PRN
  Administered 2021-10-11 – 2021-10-12 (×3): 650 mg via ORAL
  Filled 2021-10-11 (×3): qty 2

## 2021-10-11 MED ORDER — PREDNISONE 5 MG PO TABS
5.0000 mg | ORAL_TABLET | Freq: Every day | ORAL | Status: DC
Start: 2021-10-11 — End: 2021-10-13
  Administered 2021-10-11 – 2021-10-13 (×3): 5 mg via ORAL
  Filled 2021-10-11 (×3): qty 1

## 2021-10-11 MED ORDER — MYCOPHENOLATE SODIUM 180 MG PO TBEC
360.0000 mg | DELAYED_RELEASE_TABLET | Freq: Two times a day (BID) | ORAL | Status: DC
Start: 2021-10-11 — End: 2021-10-11
  Administered 2021-10-11: 360 mg via ORAL
  Filled 2021-10-11: qty 2

## 2021-10-11 MED ORDER — BUTALBITAL-APAP-CAFFEINE 50-325-40 MG PO TABS
2.0000 | ORAL_TABLET | Freq: Once | ORAL | Status: AC
  Administered 2021-10-11: 2 via ORAL
  Filled 2021-10-11: qty 2

## 2021-10-11 MED ORDER — ENOXAPARIN SODIUM 40 MG/0.4ML IJ SOSY
40.0000 mg | PREFILLED_SYRINGE | INTRAMUSCULAR | Status: DC
  Administered 2021-10-11: 40 mg via SUBCUTANEOUS
  Filled 2021-10-11 (×2): qty 0.4

## 2021-10-11 MED ORDER — TACROLIMUS 1 MG PO CAPS: 6.0000 mg | ORAL_CAPSULE | Freq: Two times a day (BID) | ORAL | Status: DC

## 2021-10-11 MED ORDER — IOHEXOL 300 MG/ML  SOLN
75.0000 mL | Freq: Once | INTRAMUSCULAR | Status: AC | PRN
  Administered 2021-10-11: 75 mL via INTRAVENOUS

## 2021-10-11 MED ORDER — ONDANSETRON HCL 4 MG/2ML IJ SOLN: 4.0000 mg | Freq: Four times a day (QID) | INTRAMUSCULAR | Status: DC | PRN

## 2021-10-11 NOTE — Plan of Care (Signed)
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
## 2021-10-11 NOTE — Plan of Care (Signed)
°  Problem: Education: Goal: Knowledge of General Education information will improve Description: Including pain rating scale, medication(s)/side effects and non-pharmacologic comfort measures Outcome: Progressing   Problem: Clinical Measurements: Goal: Will remain free from infection Outcome: Progressing Goal: Diagnostic test results will improve Outcome: Progressing   Problem: Nutrition: Goal: Adequate nutrition will be maintained Outcome: Progressing   Problem: Pain Managment: Goal: General experience of comfort will improve Outcome: Progressing   Problem: Skin Integrity: Goal: Risk for impaired skin integrity will decrease Outcome: Progressing   Problem: Urinary Elimination: Goal: Signs and symptoms of infection will decrease Outcome: Progressing

## 2021-10-11 NOTE — Assessment & Plan Note (Addendum)
Follows with [facility] I spoke with Dr. Loma Boston above who recommended adjusting mycophenolate as above.  She recommended avoiding IV contrast (though can use if indicated).

## 2021-10-11 NOTE — Hospital Course (Addendum)
36 year old woman PMH ESRD on HD, status post right pelvic renal transplant 2019, CMV, recurrent pyelonephritis with ESBL, presented with sore throat, malaise, myalgias.  Admitted and treated empirically for pyelonephritis.

## 2021-10-11 NOTE — Assessment & Plan Note (Addendum)
Treating for pyelo, but she did not c/o specific UTI symptoms and abdominal tenderness is mild at most.  Her main complaint is headache and sore throat this morning. CT showed RLQ transplant with patchy areas of hypoenhancement, surrounding fat stranding, perinephric edema - concerning for pyelonephritis UA not concerning for UTI, but immunocompromised, follow urine culture Follow pending blood cultures Continue ceftriaxone for now, at last hospitalization had e. Coli bacteremia (grew e. Coli in 08/17/21), both sensitive to ceftriaxone Negative monospot, negative strep Follow EBV, CMV, RSV, and RVP I spoke with her transplant doctors at [facility] and they recommended considering Fungitell as well, but currently well appearing, will hold off on this for now - consider adding if not responding or w/u unrevealing.  Follow current pending workup.

## 2021-10-11 NOTE — H&P (Signed)
History and Physical    Katie Lyons ZHY:[id number] DOB: [birth year] DOA: 10/10/2021  PCP: Wallace Going, Mignon Pine, MD   Patient coming from: Home   Chief Complaint: Chills, aches   HPI: Katie Lyons is a pleasant 36 y.o. female with medical history significant for ESRD now with renal transplant, recurrent pyelonephritis on methenamine, and history of ESBL, now presenting to the emergency department with 1 day of aches and chills.  She reports a mild sore throat but denies cough, shortness of breath, dysuria, nausea, vomiting, diarrhea, rash, or wounds.  She denied abdominal pain.  ED Course: Upon arrival to the ED, patient is found to be febrile with heart rate 105 initially and stable blood pressure.  Labs notable for sodium 132, bicarbonate 18, WBC 15,700, and lactic acid 0.7.  COVID and influenza were negative.  Group A strep was negative.  Blood culture was collected in the ED and the patient was given a liter of saline and 1 g IV Rocephin.  Review of Systems:  All other systems reviewed and apart from HPI, are negative.  Past Medical History:  Diagnosis Date   Anxiety    Chronic ischemic heart disease    Depression    3-4 years ago per husband   End stage renal disease ([place])    Headache    Hypertension    Iron deficiency    Secondary hyperparathyroidism ([place])    Traumatic brain injury     Past Surgical History:  Procedure Laterality Date   ARTERIOVENOUS GRAFT PLACEMENT     AV FISTULA PLACEMENT Left 04/13/2016   Procedure: UPPER EXTREMITY LEFT  ARTERIOVENOUS (AV) FISTULA CREATION;  Surgeon: Waynetta Sandy, MD;  Location: [facility];  Service: Vascular;  Laterality: Left;   [place] Right 04/10/2016   Procedure: REMOVAL OF Right Leg ARTERIOVENOUS GORETEX GRAFT ([place]);  Surgeon: Waynetta Sandy, MD;  Location: [facility];  Service: Vascular;  Laterality: Right;   INSERTION OF DIALYSIS CATHETER Right 04/10/2016   Procedure: insertion  Dialysis catheter;  Surgeon: Waynetta Sandy, MD;  Location: [facility];  Service: Vascular;  Laterality: Right;   INSERTION OF DIALYSIS CATHETER Right 04/13/2016   Procedure: INSERTION OF DIALYSIS CATHETER RIGHT INTERNAL JUGULAR;  Surgeon: Waynetta Sandy, MD;  Location: [facility];  Service: Vascular;  Laterality: Right;   IR FLUORO GUIDE CV LINE RIGHT  03/11/2021   IR PERC TUN PERIT CATH WO PORT S&I /IMAG  07/21/2021   IR US GUIDE VASC ACCESS RIGHT  03/11/2021   Kidney Transplant x 2 months ago     NO PAST SURGERIES     REVISION OF ARTERIOVENOUS GORETEX GRAFT Right 01/18/2016   Procedure: REVISION OF ARTERIOVENOUS GORETEX GRAFT Right leg;  Surgeon: Rosetta Posner, MD;  Location: [facility];  Service: Vascular;  Laterality: Right;    Social History:   reports that she has never smoked. She has never used smokeless tobacco. She reports that she does not drink alcohol and does not use drugs.  No Known Allergies  History reviewed. No pertinent family history.   Prior to Admission medications   Medication Sig Start Date End Date Taking? Authorizing Provider  acetaminophen (TYLENOL) 325 MG tablet Take 2 tablets (650 mg total) by mouth every 6 (six) hours as needed for fever. 03/14/21   Raiford Noble Latif, DO  magnesium oxide (MAG-OX) 400 MG tablet Take 400 mg by mouth 2 (two) times daily.    [provider]  mycophenolate (MYFORTIC) 180 MG EC tablet Take  360 mg by mouth 2 (two) times daily. 10/09/18   [provider]  ondansetron (ZOFRAN) 4 MG tablet Take 1 tablet (4 mg total) by mouth every 6 (six) hours as needed for nausea. Patient not taking: Reported on 07/16/2021 03/14/21   Raiford Noble Latif, DO  polyethylene glycol (MIRALAX / GLYCOLAX) 17 g packet Take 17 g by mouth daily as needed for mild constipation. Patient not taking: Reported on 07/16/2021 03/14/21   Raiford Noble Latif, DO  predniSONE (DELTASONE) 5 MG tablet Take 1 tablet (5 mg total) by mouth daily. Resume after  finishing steroid taper. 09/11/19   Little Ishikawa, MD  sodium bicarbonate 650 MG tablet Take 2,600 mg by mouth 3 (three) times daily. Four tablets three times a day 05/20/18   [provider]  sulfamethoxazole-trimethoprim (BACTRIM) 400-80 MG tablet Take 1 tablet by mouth every Monday, Wednesday, and Friday. 05/25/21   [provider]  tacrolimus (PROGRAF) 1 MG capsule Take 6 mg by mouth 2 (two) times daily. 10/07/18   [provider]    Physical Exam: Vitals:   10/10/21 2118 10/11/21 0015 10/11/21 0321  BP: (!) 131/98 115/75 127/89  Pulse: (!) 105 82 78  Resp: 18 20 14   Temp: (!) 102.5 F (39.2 C)  98.6 F (37 C)  TempSrc: Oral  Oral  SpO2: 97% 97% 99%  Weight: 59 kg    Height: 5' (1.524 m)      Constitutional: NAD, calm  Eyes: PERTLA, lids and conjunctivae normal ENMT: Mucous membranes are moist. Posterior pharynx clear of any exudate or lesions.   Neck: supple, no masses  Respiratory: no wheezing, no crackles. No accessory muscle use.  Cardiovascular: S1 & S2 heard, regular rate and rhythm. No extremity edema.   Abdomen: No distension, soft, tender in lower abdomen, no rebound pain or guarding. Bowel sounds active.  Musculoskeletal: no clubbing / cyanosis. No joint deformity upper and lower extremities.   Skin: no significant rashes, lesions, ulcers. Warm, dry, well-perfused. Neurologic: CN 2-12 grossly intact. Moving all extremities. Alert and oriented.  Psychiatric: Pleasant. Cooperative.    Labs and Imaging on Admission: I have personally reviewed following labs and imaging studies  CBC: Recent Labs  Lab 10/10/21 2206  WBC 15.7*  NEUTROABS 13.3*  HGB 13.6  HCT 41.1  MCV 86.7  PLT 891   Basic Metabolic Panel: Recent Labs  Lab 10/10/21 2206  NA 132*  K 3.7  CL 105  CO2 18*  GLUCOSE 116*  BUN 16  CREATININE 0.87  CALCIUM 9.2   GFR: Estimated Creatinine Clearance: 70.5 mL/min (by C-G formula based on SCr of 0.87  mg/dL). Liver Function Tests: Recent Labs  Lab 10/10/21 2206  AST 28  ALT 30  ALKPHOS 106  BILITOT 0.4  PROT 7.4  ALBUMIN 4.1   No results for input(s): LIPASE, AMYLASE in the last 168 hours. No results for input(s): AMMONIA in the last 168 hours. Coagulation Profile: No results for input(s): INR, PROTIME in the last 168 hours. Cardiac Enzymes: No results for input(s): CKTOTAL, CKMB, CKMBINDEX, TROPONINI in the last 168 hours. BNP (last 3 results) No results for input(s): PROBNP in the last 8760 hours. HbA1C: No results for input(s): HGBA1C in the last 72 hours. CBG: No results for input(s): GLUCAP in the last 168 hours. Lipid Profile: No results for input(s): CHOL, HDL, LDLCALC, TRIG, CHOLHDL, LDLDIRECT in the last 72 hours. Thyroid Function Tests: No results for input(s): TSH, T4TOTAL, FREET4, T3FREE, THYROIDAB in the last  72 hours. Anemia Panel: No results for input(s): VITAMINB12, FOLATE, FERRITIN, TIBC, IRON, RETICCTPCT in the last 72 hours. Urine analysis:    Component Value Date/Time   COLORURINE YELLOW 10/10/2021 2206   APPEARANCEUR CLEAR 10/10/2021 2206   LABSPEC 1.021 10/10/2021 2206   PHURINE 7.0 10/10/2021 2206   GLUCOSEU NEGATIVE 10/10/2021 2206   HGBUR NEGATIVE 10/10/2021 2206   BILIRUBINUR NEGATIVE 10/10/2021 2206   KETONESUR 20 (A) 10/10/2021 2206   PROTEINUR 30 (A) 10/10/2021 2206   UROBILINOGEN 0.2 12/09/2013 1556   NITRITE NEGATIVE 10/10/2021 2206   LEUKOCYTESUR NEGATIVE 10/10/2021 2206   Sepsis Labs: @LABRCNTIP (procalcitonin:4,lacticidven:4) ) Recent Results (from the past 240 hour(s))  Resp Panel by RT-PCR (Flu A&B, Covid) Nasopharyngeal Swab     Status: None   Collection Time: 10/10/21 10:06 PM   Specimen: Nasopharyngeal Swab; Nasopharyngeal(NP) swabs in vial transport medium  Result Value Ref Range Status   SARS Coronavirus 2 by RT PCR NEGATIVE NEGATIVE Final    Comment: (NOTE) SARS-CoV-2 target nucleic acids are NOT DETECTED.  The  SARS-CoV-2 RNA is generally detectable in upper respiratory specimens during the acute phase of infection. The lowest concentration of SARS-CoV-2 viral copies this assay can detect is 138 copies/mL. A negative result does not preclude SARS-Cov-2 infection and should not be used as the sole basis for treatment or other patient management decisions. A negative result may occur with  improper specimen collection/handling, submission of specimen other than nasopharyngeal swab, presence of viral mutation(s) within the areas targeted by this assay, and inadequate number of viral copies(<138 copies/mL). A negative result must be combined with clinical observations, patient history, and epidemiological information. The expected result is Negative.  Fact Sheet for Patients:  EntrepreneurPulse.com.au  Fact Sheet for Healthcare Providers:  IncredibleEmployment.be  This test is no t yet approved or cleared by the [place] FDA and  has been authorized for detection and/or diagnosis of SARS-CoV-2 by FDA under an Emergency Use Authorization (EUA). This EUA will remain  in effect (meaning this test can be used) for the duration of the COVID-19 declaration under Section 564(b)(1) of the Act, 21 U.S.C.section 360bbb-3(b)(1), unless the authorization is terminated  or revoked sooner.       Influenza A by PCR NEGATIVE NEGATIVE Final   Influenza B by PCR NEGATIVE NEGATIVE Final    Comment: (NOTE) The Xpert Xpress SARS-CoV-2/FLU/RSV plus assay is intended as an aid in the diagnosis of influenza from Nasopharyngeal swab specimens and should not be used as a sole basis for treatment. Nasal washings and aspirates are unacceptable for Xpert Xpress SARS-CoV-2/FLU/RSV testing.  Fact Sheet for Patients: EntrepreneurPulse.com.au  Fact Sheet for Healthcare Providers: IncredibleEmployment.be  This test is not yet approved or  cleared by the [place] FDA and has been authorized for detection and/or diagnosis of SARS-CoV-2 by FDA under an Emergency Use Authorization (EUA). This EUA will remain in effect (meaning this test can be used) for the duration of the COVID-19 declaration under Section 564(b)(1) of the Act, 21 U.S.C. section 360bbb-3(b)(1), unless the authorization is terminated or revoked.  Performed at [facility], [place] [address] [place], [place] [address]   Group A Strep by PCR     Status: None   Collection Time: 10/10/21 11:50 PM   Specimen: Throat; Sterile Swab  Result Value Ref Range Status   Group A Strep by PCR NOT DETECTED NOT DETECTED Final    Comment: Performed at [facility], [place] [address] [place], [place] [address]  Radiological Exams on Admission: DG Chest 2 View  Result Date: 10/10/2021 CLINICAL DATA:  Fever. EXAM: CHEST - 2 VIEW COMPARISON:  Chest radiograph dated 07/16/2021. FINDINGS: There is diffuse chronic interstitial prominence and bronchitic changes. No focal consolidation, pleural effusion, or pneumothorax. The cardiac silhouette is within normal limits. No acute osseous pathology. IMPRESSION: No active cardiopulmonary disease. Electronically Signed   By: Anner Crete M.D.   On: 10/10/2021 23:36   CT ABDOMEN PELVIS W CONTRAST  Result Date: 10/11/2021 CLINICAL DATA:  Sepsis, fever/chills. Tender over transplant kidney. EXAM: CT ABDOMEN AND PELVIS WITH CONTRAST TECHNIQUE: Multidetector CT imaging of the abdomen and pelvis was performed using the standard protocol following bolus administration of intravenous contrast. RADIATION DOSE REDUCTION: This exam was performed according to the departmental dose-optimization program which includes automated exposure control, adjustment of the mA and/or kV according to patient size and/or use of iterative reconstruction technique. CONTRAST:  19mL OMNIPAQUE IOHEXOL 300 MG/ML  SOLN COMPARISON:   07/16/2021. FINDINGS: Lower chest: Mild atelectasis at the lung bases. Hepatobiliary: No focal liver abnormality is seen. No gallstones, gallbladder wall thickening, or biliary dilatation. Pancreas: Unremarkable. No pancreatic ductal dilatation or surrounding inflammatory changes. Spleen: Normal in size without focal abnormality. Adrenals/Urinary Tract: The adrenal glands are within normal limits. There is atrophy of the native kidneys bilaterally. A right lower quadrant renal transplant is noted with surrounding fat stranding and edema. Mild patchy hypoenhancement is noted in the upper and lower poles of the renal transplant. No obstructive uropathy is identified. The urinary bladder is unremarkable. Stomach/Bowel: The stomach is within normal limits. No bowel obstruction, free are, or pneumatosis. No focal bowel wall thickening. A normal appendix is seen in the right lower quadrant. Vascular/Lymphatic: Aortic atherosclerosis. No enlarged abdominal or pelvic lymph nodes. Reproductive: Uterus and bilateral adnexa are unremarkable. Other: Trace amount of free fluid in the perirenal space in the right lower quadrant. Small fat containing umbilical hernia. Musculoskeletal: No acute osseous abnormality. IMPRESSION: 1. Right lower quadrant renal transplant with patchy areas of hypoenhancement in the upper and lower poles and surrounding fat stranding and perinephric edema, concerning for pyelonephritis. 2. Atrophy of the native kidneys. 3. Aortic atherosclerosis. Electronically Signed   By: Brett Fairy M.D.   On: 10/11/2021 04:30    Assessment/Plan  1. Pyelonephritis of transplanted kidney  - Pt presents with 1 day of aches and chills, was found to be febrile with leukocytosis, had blood cultures drawn and was given a dose of Rocephin in ED  - She was tender in lower abdomen and CT was obtained with findings suggestive of pyelonephritis of the transplanted kidney  - She has hx of ESBL in July 2022 but more  recent cultures grew E coli sensitive to Rocephin   - qSOFA score is 0, lactate 0.7, she has defervesced, and vitals now normal  - Plan to add-on urine culture, continue Rocephin for now, change to meropenem if she worsens, follow cultures and clinical course, continue immunosuppressants     DVT prophylaxis: Lovenox  Code Status: Full  Level of Care: Level of care: Med-Surg Family Communication: none present  Disposition Plan:  Patient is from: home  Anticipated d/c is to: home  Anticipated d/c date is: 10/13/21  Patient currently: Pending cultures Consults called: none  Admission status: Observation   Translator: Donnamarie Poag #[id number]  Vianne Bulls, MD [facility]  10/11/2021, 5:35 AM

## 2021-10-11 NOTE — Assessment & Plan Note (Signed)
Chronic prednisone 5 mg, tacrolimus 7 mg qam and 6 mg qpm, and mycophenolate 360 mg BID for her kidney transplant Dr. Loma Boston recommended decreasing mycophenolate dose to 180 mg BID for now

## 2021-10-11 NOTE — Progress Notes (Addendum)
PROGRESS NOTE    Katie Lyons  EUM:[id number] DOB: 05-06-1986 DOA: 10/10/2021 PCP: Wallace Going, Mignon Pine, MD  Chief Complaint  Patient presents with   Generalized Body Aches    Brief Narrative:  36 yo with hx ESRD on HD now s/p R pelvic renal transplant in 2019 with hx CMV and recurrent pyelonephritis with ESBL, HTN, ICH who presents with fever, sore throat, malaise, and myalgias.  She's been admitted with CT findings concerning for pyelonephritis.  Currently on ceftriaxone.    Assessment & Plan:   Principal Problem:   Pyelonephritis of transplanted kidney Active Problems:   Sepsis ([place])   Immunosuppressed status ([place])   Kidney transplanted   Assessment and Plan: Sepsis ([place])- (present on admission) Treating for pyelo, but she did not c/o specific UTI symptoms and abdominal tenderness is mild at most.  Her main complaint is headache and sore throat this morning. CT showed RLQ transplant with patchy areas of hypoenhancement, surrounding fat stranding, perinephric edema - concerning for pyelonephritis UA not concerning for UTI, but immunocompromised, follow urine culture Follow pending blood cultures Continue ceftriaxone for now, at last hospitalization had e. Coli bacteremia (grew e. Coli in 08/17/21), both sensitive to ceftriaxone Negative monospot, negative strep Follow EBV, CMV, RSV, and RVP I spoke with her transplant doctors at [facility] and they recommended considering Fungitell as well, but currently well appearing, will hold off on this for now - consider adding if not responding or w/u unrevealing.  Follow current pending workup.   Immunosuppressed status (HCC) Chronic prednisone 5 mg, tacrolimus 7 mg qam and 6 mg qpm, and mycophenolate 360 mg BID for her kidney transplant Dr. Loma Boston recommended decreasing mycophenolate dose to 180 mg BID for now   Kidney transplanted Follows with [facility] I spoke with Dr. Loma Boston above who recommended adjusting  mycophenolate as above.  She recommended avoiding IV contrast (though can use if indicated).    DVT prophylaxis: lovenox Code Status: full Family Communication: none Disposition:   Status is: Observation The patient remains OBS appropriate and will d/c before 2 midnights.   Consultants:  Transplant surgeon over phone  Procedures:  none  Antimicrobials:  Anti-infectives (From admission, onward)    Start     Dose/Rate Route Frequency Ordered Stop   10/11/21 2330  cefTRIAXone (ROCEPHIN) 2 g in sodium chloride 0.9 % 100 mL IVPB        2 g 200 mL/hr over 30 Minutes Intravenous Every 24 hours 10/11/21 0521     10/11/21 0530  cefTRIAXone (ROCEPHIN) 2 g in sodium chloride 0.9 % 100 mL IVPB  Status:  Discontinued        2 g 200 mL/hr over 30 Minutes Intravenous Every 24 hours 10/11/21 0520 10/11/21 0521   10/10/21 2330  cefTRIAXone (ROCEPHIN) 1 g in sodium chloride 0.9 % 100 mL IVPB        1 g 200 mL/hr over 30 Minutes Intravenous  Once 10/10/21 2320 10/11/21 0113       Subjective: C/o sore throat and headache Used spanish interpreter  Objective: Vitals:   10/11/21 1117 10/11/21 1121 10/11/21 1333 10/11/21 1759  BP: 112/70  114/83 131/84  Pulse: 68  93 73  Resp: 18  17 16   Temp:  98.2 F (36.8 C) 98.6 F (37 C) 98.3 F (36.8 C)  TempSrc:  Oral Oral Oral  SpO2: 98%  100% 100%  Weight:      Height:       No intake or output data in  the 24 hours ending 10/11/21 1813 Filed Weights   10/10/21 2118  Weight: 59 kg    Examination:  General exam: Appears calm and comfortable  Oropharynx without exudate, submandibular fullness and mild ttp, no postcervical LAD Respiratory system: Clear to auscultation. Respiratory effort normal. Cardiovascular system: RRR Gastrointestinal system: Abdomen is nondistended, soft and nontender. Central nervous system: Alert and oriented. No focal neurological deficits. Extremities: no LEE Skin: No rashes, lesions or ulcers Psychiatry:  Judgement and insight appear normal. Mood & affect appropriate.     Data Reviewed: I have personally reviewed following labs and imaging studies  CBC: Recent Labs  Lab 10/10/21 2206  WBC 15.7*  NEUTROABS 13.3*  HGB 13.6  HCT 41.1  MCV 86.7  PLT 161    Basic Metabolic Panel: Recent Labs  Lab 10/10/21 2206 10/11/21 0521  NA 132* 133*  K 3.7 3.5  CL 105 107  CO2 18* 20*  GLUCOSE 116* 100*  BUN 16 11  CREATININE 0.87 0.83  CALCIUM 9.2 8.6*    GFR: Estimated Creatinine Clearance: 73.8 mL/min (by C-G formula based on SCr of 0.83 mg/dL).  Liver Function Tests: Recent Labs  Lab 10/10/21 2206  AST 28  ALT 30  ALKPHOS 106  BILITOT 0.4  PROT 7.4  ALBUMIN 4.1    CBG: No results for input(s): GLUCAP in the last 168 hours.   Recent Results (from the past 240 hour(s))  Resp Panel by RT-PCR (Flu Jaydon Soroka&B, Covid) Nasopharyngeal Swab     Status: None   Collection Time: 10/10/21 10:06 PM   Specimen: Nasopharyngeal Swab; Nasopharyngeal(NP) swabs in vial transport medium  Result Value Ref Range Status   SARS Coronavirus 2 by RT PCR NEGATIVE NEGATIVE Final    Comment: (NOTE) SARS-CoV-2 target nucleic acids are NOT DETECTED.  The SARS-CoV-2 RNA is generally detectable in upper respiratory specimens during the acute phase of infection. The lowest concentration of SARS-CoV-2 viral copies this assay can detect is 138 copies/mL. Leondra Cullin negative result does not preclude SARS-Cov-2 infection and should not be used as the sole basis for treatment or other patient management decisions. Pookela Sellin negative result may occur with  improper specimen collection/handling, submission of specimen other than nasopharyngeal swab, presence of viral mutation(s) within the areas targeted by this assay, and inadequate number of viral copies(<138 copies/mL). Adelynn Gipe negative result must be combined with clinical observations, patient history, and epidemiological information. The expected result is Negative.  Fact  Sheet for Patients:  EntrepreneurPulse.com.au  Fact Sheet for Healthcare Providers:  IncredibleEmployment.be  This test is no t yet approved or cleared by the [place] FDA and  has been authorized for detection and/or diagnosis of SARS-CoV-2 by FDA under an Emergency Use Authorization (EUA). This EUA will remain  in effect (meaning this test can be used) for the duration of the COVID-19 declaration under Section 564(b)(1) of the Act, 21 U.S.C.section 360bbb-3(b)(1), unless the authorization is terminated  or revoked sooner.       Influenza Mosiah Bastin by PCR NEGATIVE NEGATIVE Final   Influenza B by PCR NEGATIVE NEGATIVE Final    Comment: (NOTE) The Xpert Xpress SARS-CoV-2/FLU/RSV plus assay is intended as an aid in the diagnosis of influenza from Nasopharyngeal swab specimens and should not be used as Rita Prom sole basis for treatment. Nasal washings and aspirates are unacceptable for Xpert Xpress SARS-CoV-2/FLU/RSV testing.  Fact Sheet for Patients: EntrepreneurPulse.com.au  Fact Sheet for Healthcare Providers: IncredibleEmployment.be  This test is not yet approved or cleared by the [place] FDA  and has been authorized for detection and/or diagnosis of SARS-CoV-2 by FDA under an Emergency Use Authorization (EUA). This EUA will remain in effect (meaning this test can be used) for the duration of the COVID-19 declaration under Section 564(b)(1) of the Act, 21 U.S.C. section 360bbb-3(b)(1), unless the authorization is terminated or revoked.  Performed at [facility], [place] [address], [place], [place] [address]   Group Candiss Galeana Strep by PCR     Status: None   Collection Time: 10/10/21 11:50 PM   Specimen: Throat; Sterile Swab  Result Value Ref Range Status   Group Opal Mckellips Strep by PCR NOT DETECTED NOT DETECTED Final    Comment: Performed at [facility], [place] [address],  [place], [place] [address]         Radiology Studies: DG Chest 2 View  Result Date: 10/10/2021 CLINICAL DATA:  Fever. EXAM: CHEST - 2 VIEW COMPARISON:  Chest radiograph dated 07/16/2021. FINDINGS: There is diffuse chronic interstitial prominence and bronchitic changes. No focal consolidation, pleural effusion, or pneumothorax. The cardiac silhouette is within normal limits. No acute osseous pathology. IMPRESSION: No active cardiopulmonary disease. Electronically Signed   By: Anner Crete M.D.   On: 10/10/2021 23:36   CT ABDOMEN PELVIS W CONTRAST  Result Date: 10/11/2021 CLINICAL DATA:  Sepsis, fever/chills. Tender over transplant kidney. EXAM: CT ABDOMEN AND PELVIS WITH CONTRAST TECHNIQUE: Multidetector CT imaging of the abdomen and pelvis was performed using the standard protocol following bolus administration of intravenous contrast. RADIATION DOSE REDUCTION: This exam was performed according to the departmental dose-optimization program which includes automated exposure control, adjustment of the mA and/or kV according to patient size and/or use of iterative reconstruction technique. CONTRAST:  41mL OMNIPAQUE IOHEXOL 300 MG/ML  SOLN COMPARISON:  07/16/2021. FINDINGS: Lower chest: Mild atelectasis at the lung bases. Hepatobiliary: No focal liver abnormality is seen. No gallstones, gallbladder wall thickening, or biliary dilatation. Pancreas: Unremarkable. No pancreatic ductal dilatation or surrounding inflammatory changes. Spleen: Normal in size without focal abnormality. Adrenals/Urinary Tract: The adrenal glands are within normal limits. There is atrophy of the native kidneys bilaterally. Caitlan Chauca right lower quadrant renal transplant is noted with surrounding fat stranding and edema. Mild patchy hypoenhancement is noted in the upper and lower poles of the renal transplant. No obstructive uropathy is identified. The urinary bladder is unremarkable. Stomach/Bowel: The stomach is within normal limits. No  bowel obstruction, free are, or pneumatosis. No focal bowel wall thickening. Stori Royse normal appendix is seen in the right lower quadrant. Vascular/Lymphatic: Aortic atherosclerosis. No enlarged abdominal or pelvic lymph nodes. Reproductive: Uterus and bilateral adnexa are unremarkable. Other: Trace amount of free fluid in the perirenal space in the right lower quadrant. Small fat containing umbilical hernia. Musculoskeletal: No acute osseous abnormality. IMPRESSION: 1. Right lower quadrant renal transplant with patchy areas of hypoenhancement in the upper and lower poles and surrounding fat stranding and perinephric edema, concerning for pyelonephritis. 2. Atrophy of the native kidneys. 3. Aortic atherosclerosis. Electronically Signed   By: Brett Fairy M.D.   On: 10/11/2021 04:30        Scheduled Meds:  enoxaparin (LOVENOX) injection  40 mg Subcutaneous Q24H   mycophenolate  180 mg Oral BID   predniSONE  5 mg Oral Daily   tacrolimus  7 mg Oral Daily   And   tacrolimus  6 mg Oral QHS   Continuous Infusions:  cefTRIAXone (ROCEPHIN)  IV       LOS: 0 days    Time spent:  over 30 min    Fayrene Helper, MD [facility]   To contact the attending provider between 7A-7P or the covering provider during after hours 7P-7A, please log into the web site [email or web address] and access using universal [place] password for that web site. If you do not have the password, please call the hospital operator.  10/11/2021, 6:13 PM

## 2021-10-12 DIAGNOSIS — A419 Sepsis, unspecified organism: Secondary | ICD-10-CM

## 2021-10-12 DIAGNOSIS — T8619 Other complication of kidney transplant: Secondary | ICD-10-CM | POA: Diagnosis not present

## 2021-10-12 DIAGNOSIS — Z940 Kidney transplant status: Secondary | ICD-10-CM | POA: Diagnosis not present

## 2021-10-12 DIAGNOSIS — D849 Immunodeficiency, unspecified: Secondary | ICD-10-CM | POA: Diagnosis not present

## 2021-10-12 LAB — RESPIRATORY PANEL BY PCR
Adenovirus: NOT DETECTED
Bordetella Parapertussis: NOT DETECTED
Bordetella pertussis: NOT DETECTED
Chlamydophila pneumoniae: NOT DETECTED
Coronavirus 229E: NOT DETECTED
Coronavirus HKU1: NOT DETECTED
Coronavirus NL63: NOT DETECTED
Coronavirus OC43: NOT DETECTED
Influenza B: NOT DETECTED
Metapneumovirus: NOT DETECTED
Mycoplasma pneumoniae: NOT DETECTED
Parainfluenza Virus 1: NOT DETECTED
Parainfluenza Virus 2: NOT DETECTED
Parainfluenza Virus 3: NOT DETECTED
Parainfluenza Virus 4: NOT DETECTED
Respiratory Syncytial Virus: NOT DETECTED
Rhinovirus / Enterovirus: NOT DETECTED

## 2021-10-12 LAB — BLOOD CULTURE ID PANEL (REFLEXED) - BCID2

## 2021-10-12 LAB — CBC
HCT: 40.5 % (ref 36.0–46.0)
Hemoglobin: 13.1 g/dL (ref 12.0–15.0)
MCH: 28.5 pg (ref 26.0–34.0)
MCHC: 32.3 g/dL (ref 30.0–36.0)
MCV: 88 fL (ref 80.0–100.0)
Platelets: 138 10*3/uL — ABNORMAL LOW (ref 150–400)
RBC: 4.6 MIL/uL (ref 3.87–5.11)
RDW: 13.8 % (ref 11.5–15.5)
WBC: 10.4 10*3/uL (ref 4.0–10.5)
nRBC: 0 % (ref 0.0–0.2)

## 2021-10-12 LAB — BASIC METABOLIC PANEL
Anion gap: 9 (ref 5–15)
BUN: 11 mg/dL (ref 6–20)
CO2: 17 mmol/L — ABNORMAL LOW (ref 22–32)
Calcium: 9.4 mg/dL (ref 8.9–10.3)
Chloride: 108 mmol/L (ref 98–111)
Creatinine, Ser: 0.69 mg/dL (ref 0.44–1.00)
GFR, Estimated: >60 mL/min (ref >60)
Glucose, Bld: 115 mg/dL — ABNORMAL HIGH (ref 70–99)
Potassium: 4.3 mmol/L (ref 3.5–5.1)
Sodium: 134 mmol/L — ABNORMAL LOW (ref 135–145)

## 2021-10-12 LAB — URINE CULTURE: Culture: NO GROWTH

## 2021-10-12 LAB — CMV IGM: CMV IgM: <30 AU/mL (ref 0.0–29.9)

## 2021-10-12 NOTE — Progress Notes (Signed)
PHARMACY - PHYSICIAN COMMUNICATION ?CRITICAL VALUE ALERT - BLOOD CULTURE IDENTIFICATION (BCID) ? ?Katie Lyons is an 36 y.o. female who presented to [facility] on 10/10/2021 with pyelonephritis. ? ?Assessment:  BCID + for Staph species in 1 out of 4 bottles; likely contaminant ? ?Name of physician (or Provider) Contacted: Gershon Cull, FNP ? ?Current antibiotics: Ceftriaxone 2gm IV q24h  ? ?Changes to prescribed antibiotics recommended:  ?Patient is on recommended antibiotics - No changes needed ? ?Results for orders placed or performed during the hospital encounter of 10/10/21  ?Blood Culture ID Panel (Reflexed) (Collected: 10/10/2021 11:50 PM)  ?Result Value Ref Range  ? Enterococcus faecalis NOT DETECTED NOT DETECTED  ? Enterococcus Faecium NOT DETECTED NOT DETECTED  ? Listeria monocytogenes NOT DETECTED NOT DETECTED  ? Staphylococcus species DETECTED (A) NOT DETECTED  ? Staphylococcus aureus (BCID) NOT DETECTED NOT DETECTED  ? Staphylococcus epidermidis NOT DETECTED NOT DETECTED  ? Staphylococcus lugdunensis NOT DETECTED NOT DETECTED  ? Streptococcus species NOT DETECTED NOT DETECTED  ? Streptococcus agalactiae NOT DETECTED NOT DETECTED  ? Streptococcus pneumoniae NOT DETECTED NOT DETECTED  ? Streptococcus pyogenes NOT DETECTED NOT DETECTED  ? A.calcoaceticus-baumannii NOT DETECTED NOT DETECTED  ? Bacteroides fragilis NOT DETECTED NOT DETECTED  ? Enterobacterales NOT DETECTED NOT DETECTED  ? Enterobacter cloacae complex NOT DETECTED NOT DETECTED  ? Escherichia coli NOT DETECTED NOT DETECTED  ? Klebsiella aerogenes NOT DETECTED NOT DETECTED  ? Klebsiella oxytoca NOT DETECTED NOT DETECTED  ? Klebsiella pneumoniae NOT DETECTED NOT DETECTED  ? Proteus species NOT DETECTED NOT DETECTED  ? Salmonella species NOT DETECTED NOT DETECTED  ? Serratia marcescens NOT DETECTED NOT DETECTED  ? Haemophilus influenzae NOT DETECTED NOT DETECTED  ? Neisseria meningitidis NOT DETECTED NOT DETECTED  ? Pseudomonas  aeruginosa NOT DETECTED NOT DETECTED  ? Stenotrophomonas maltophilia NOT DETECTED NOT DETECTED  ? Candida albicans NOT DETECTED NOT DETECTED  ? Candida auris NOT DETECTED NOT DETECTED  ? Candida glabrata NOT DETECTED NOT DETECTED  ? Candida krusei NOT DETECTED NOT DETECTED  ? Candida parapsilosis NOT DETECTED NOT DETECTED  ? Candida tropicalis NOT DETECTED NOT DETECTED  ? Cryptococcus neoformans/gattii NOT DETECTED NOT DETECTED  ? ? ?Everette Rank, PharmD ?10/12/2021  4:06 AM ? ?
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
## 2021-10-12 NOTE — Progress Notes (Signed)
?  Progress Note ? ? ?PatientMadelein Lyons PPJ:[id number] DOB: September 19, 1985 DOA: 10/10/2021     0 ?DOS: the patient was seen and examined on 10/12/2021 ?  ?Brief hospital course: ?36 year old woman PMH ESRD on HD, status post right pelvic renal transplant 2019, CMV, recurrent pyelonephritis with ESBL, presented with sore throat, malaise, myalgias.  Admitted and treated empirically for pyelonephritis. ? ?Assessment and Plan: ?Sepsis secondary to presumed pyelonephritis ?-- Not clearly symptomatic but CT suggestive for pyelonephritis ?in the transplant kidney.  No other source found and patient seems to have responded to treatment.  Upper respiratory symptoms have resolved including sore throat, unclear significance. ?--CMP unremarkable.  Negative Monospot and strep. ?--Follow EBV ?Dr. Florene Glen spoke with her transplant doctors at [facility] and they recommended considering Fungitell as well, but given the improvement this was not started. ? ?Immunosuppressed status ([place]) ?--Chronic prednisone 5 mg, tacrolimus 7 mg qam and 6 mg qpm, and mycophenolate 360 mg BID for her kidney transplant. Dr. Loma Boston recommended decreasing mycophenolate dose to 180 mg BID for now ? ?Kidney transplanted ?--Follows with [facility] ? ?Overall improving, likely change to oral antibiotics tomorrow and discharge home. ? ? ? ?Subjective:  ?Feels better, no pain in abd or back ?No n/v ?Tolerating diet ?No throat pain or muscle aches now ? ?Physical Exam: ?Vitals:  ? 10/11/21 1759 10/11/21 2141 10/12/21 0225 10/12/21 1254  ?BP: 131/84 (!) 142/94 122/68 128/88  ?Pulse: 73 77 65 70  ?Resp: 16 18 18 16   ?Temp: 98.3 ?F (36.8 ?C) 98.2 ?F (36.8 ?C) 97.9 ?F (36.6 ?C) (!) 97.5 ?F (36.4 ?C)  ?TempSrc: Oral Oral Oral Oral  ?SpO2: 100% 97% 99% 99%  ?Weight:      ?Height:      ? ?Physical Exam ?Vitals reviewed.  ?Constitutional:   ?   General: She is not in acute distress. ?   Appearance: She is not ill-appearing or toxic-appearing.  ?Cardiovascular:  ?   Rate  and Rhythm: Normal rate and regular rhythm.  ?   Heart sounds: No murmur heard. ?Pulmonary:  ?   Effort: Pulmonary effort is normal. No respiratory distress.  ?   Breath sounds: No wheezing or rales.  ?Abdominal:  ?   General: Abdomen is flat. There is no distension.  ?   Tenderness: There is no abdominal tenderness. There is no guarding.  ?Neurological:  ?   Mental Status: She is alert.  ?Psychiatric:     ?   Mood and Affect: Mood normal.     ?   Behavior: Behavior normal.  ?, ? ? ?Data Reviewed: ? ?BMP unremarkable ?Plts 138 ? ?Family Communication: daughter at bedside ? ?Disposition: ?Status is: Observation ? ? Planned Discharge Destination: Home ? ? ? ? ?Time spent: 35 minutes ? ?Author: ?Murray Hodgkins, MD ?10/12/2021 6:53 PM ? ?For on call review [email or web address].  ? ?

## 2021-10-13 DIAGNOSIS — Z7952 Long term (current) use of systemic steroids: Secondary | ICD-10-CM | POA: Diagnosis not present

## 2021-10-13 DIAGNOSIS — I259 Chronic ischemic heart disease, unspecified: Secondary | ICD-10-CM | POA: Diagnosis present

## 2021-10-13 DIAGNOSIS — Z79899 Other long term (current) drug therapy: Secondary | ICD-10-CM | POA: Diagnosis not present

## 2021-10-13 DIAGNOSIS — Z20822 Contact with and (suspected) exposure to covid-19: Secondary | ICD-10-CM | POA: Diagnosis present

## 2021-10-13 DIAGNOSIS — D849 Immunodeficiency, unspecified: Secondary | ICD-10-CM | POA: Diagnosis present

## 2021-10-13 DIAGNOSIS — T8613 Kidney transplant infection: Secondary | ICD-10-CM | POA: Diagnosis present

## 2021-10-13 DIAGNOSIS — R5381 Other malaise: Secondary | ICD-10-CM | POA: Diagnosis present

## 2021-10-13 DIAGNOSIS — N2581 Secondary hyperparathyroidism of renal origin: Secondary | ICD-10-CM | POA: Diagnosis present

## 2021-10-13 DIAGNOSIS — Y830 Surgical operation with transplant of whole organ as the cause of abnormal reaction of the patient, or of later complication, without mention of misadventure at the time of the procedure: Secondary | ICD-10-CM | POA: Diagnosis present

## 2021-10-13 DIAGNOSIS — N12 Tubulo-interstitial nephritis, not specified as acute or chronic: Secondary | ICD-10-CM | POA: Diagnosis present

## 2021-10-13 DIAGNOSIS — F419 Anxiety disorder, unspecified: Secondary | ICD-10-CM | POA: Diagnosis present

## 2021-10-13 DIAGNOSIS — R6510 Systemic inflammatory response syndrome (SIRS) of non-infectious origin without acute organ dysfunction: Secondary | ICD-10-CM | POA: Diagnosis present

## 2021-10-13 DIAGNOSIS — Z79624 Long term (current) use of inhibitors of nucleotide synthesis: Secondary | ICD-10-CM | POA: Diagnosis not present

## 2021-10-13 DIAGNOSIS — Z940 Kidney transplant status: Secondary | ICD-10-CM | POA: Diagnosis not present

## 2021-10-13 DIAGNOSIS — T8619 Other complication of kidney transplant: Secondary | ICD-10-CM | POA: Diagnosis not present

## 2021-10-13 DIAGNOSIS — A419 Sepsis, unspecified organism: Secondary | ICD-10-CM | POA: Diagnosis present

## 2021-10-13 LAB — CULTURE, BLOOD (ROUTINE X 2): Special Requests: ADEQUATE

## 2021-10-13 LAB — BASIC METABOLIC PANEL
Anion gap: 6 (ref 5–15)
BUN: 17 mg/dL (ref 6–20)
CO2: 20 mmol/L — ABNORMAL LOW (ref 22–32)
Calcium: 9.4 mg/dL (ref 8.9–10.3)
Chloride: 109 mmol/L (ref 98–111)
Creatinine, Ser: 0.9 mg/dL (ref 0.44–1.00)
GFR, Estimated: >60 mL/min (ref >60)
Glucose, Bld: 110 mg/dL — ABNORMAL HIGH (ref 70–99)
Potassium: 3.7 mmol/L (ref 3.5–5.1)
Sodium: 135 mmol/L (ref 135–145)

## 2021-10-13 LAB — EPSTEIN-BARR VIRUS VCA, IGM: EBV VCA IgM: <36 U/mL (ref 0.0–35.9)

## 2021-10-13 LAB — EPSTEIN-BARR VIRUS VCA, IGG: EBV VCA IgG: 444 U/mL — ABNORMAL HIGH (ref 0.0–17.9)

## 2021-10-13 MED ORDER — MYCOPHENOLATE SODIUM 180 MG PO TBEC: DELAYED_RELEASE_TABLET | ORAL | Status: AC

## 2021-10-13 MED ORDER — CEFDINIR 300 MG PO CAPS: 300.0000 mg | ORAL_CAPSULE | Freq: Two times a day (BID) | ORAL | 0 refills | Status: DC

## 2021-10-13 NOTE — Discharge Summary (Signed)
Physician Discharge Summary   Patient: Katie Lyons MRN: [id number] DOB: [birth year]  Admit date:     10/10/2021  Discharge date: 10/13/21  Discharge Physician: Murray Hodgkins   PCP: Wallace Going, Mignon Pine, MD   Recommendations at discharge:    Follow-up resolution of pyelonephritis Follow-up elevated EBV of unclear significance  Discharge Diagnoses: Principal Problem:   Pyelonephritis of transplanted kidney Active Problems:   Sepsis ([place])   Kidney transplanted   Immunosuppressed status ([place])   SIRS (systemic inflammatory response syndrome) [facility])  Hospital Course: 36 year old woman PMH ESRD on HD, status post right pelvic renal transplant 2019, CMV, recurrent pyelonephritis with ESBL, presented with sore throat, malaise, myalgias.  Admitted and treated empirically for pyelonephritis with rapid clinical improvement.  Culture results unrevealing.  Transition to oral antibiotics, complete as outpatient.  Discussed discharge 3/2 with on-call transplant surgeon Dr. Loma Boston who advised to continue mycophenolate at 180 mg twice daily until complete with antibiotics then resume 360 mg twice daily dosing.  She will arrange for close outpatient follow-up.  Sepsis secondary to presumed pyelonephritis -- Not clearly symptomatic but CT suggestive for pyelonephritis in the transplant kidney.  No other source found and patient seems to have responded to treatment.  Upper respiratory symptoms have resolved including sore throat, unclear significance. --CMP unremarkable.  Negative Monospot and strep. --EBV resulted high after discharge.  Unclear significance.  We will apprise transplant team    Immunosuppressed status ([place]) --Chronic prednisone 5 mg, tacrolimus 7 mg qam and 6 mg qpm, and mycophenolate 360 mg BID for her kidney transplant. Dr. Loma Boston recommended decreasing mycophenolate dose to 180 mg BID until antibiotics finished, then resume chronic dosing   Kidney  transplanted --Follows with [facility]       Consultants: none Procedures performed: none  Disposition: Home Diet recommendation:  Discharge Diet Orders (From admission, onward)     Start     Ordered   10/13/21 0000  Diet general        10/13/21 1231   10/13/21 0000  Diet - low sodium heart healthy        10/13/21 1231           Regular diet  DISCHARGE MEDICATION: Allergies as of 10/13/2021   No Known Allergies      Medication List     TAKE these medications    acetaminophen 500 MG tablet Commonly known as: TYLENOL Take 500 mg by mouth every 6 (six) hours as needed for mild pain or headache.   cefdinir 300 MG capsule Commonly known as: OMNICEF Take 1 capsule (300 mg total) by mouth 2 (two) times daily.   magnesium oxide 400 MG tablet Commonly known as: MAG-OX Take 800 mg by mouth daily.   mycophenolate 180 MG EC tablet Commonly known as: MYFORTIC Take 1 tablet (180 mg total) by mouth 2 (two) times daily for 5 days, THEN 2 tablets (360 mg total) 2 (two) times daily. Start taking on: October 13, 2021 What changed: See the new instructions.   predniSONE 5 MG tablet Commonly known as: DELTASONE Take 5 mg by mouth daily with breakfast.   sodium bicarbonate 650 MG tablet Take 2,600 mg by mouth 3 (three) times daily. Four tablets three times a day   tacrolimus 1 MG capsule Commonly known as: PROGRAF Take 6-7 mg by mouth See admin instructions. Take 7 tablets in the AM and 6 tablets in the PM.        Follow-up Information     Wallace Going,  Mignon Pine, MD. Schedule an appointment as soon as possible for a visit in 1 week(s).   Specialty: Nephrology Contact information: [place] [address] [phone]                Gala Murdoch and examined with iPad Spanish video interpreter.  Feels good, no pain, no complaints  Discharge Exam: Filed Weights   10/10/21 2118  Weight: 59 kg   Physical Exam Vitals reviewed.   Constitutional:      General: She is not in acute distress.    Appearance: She is not ill-appearing or toxic-appearing.  Cardiovascular:     Rate and Rhythm: Normal rate and regular rhythm.     Heart sounds: No murmur heard. Pulmonary:     Effort: Pulmonary effort is normal. No respiratory distress.     Breath sounds: No wheezing, rhonchi or rales.  Neurological:     Mental Status: She is alert.  Psychiatric:        Mood and Affect: Mood normal.        Behavior: Behavior normal.     Condition at discharge: good  The results of significant diagnostics from this hospitalization (including imaging, microbiology, ancillary and laboratory) are listed below for reference.   Imaging Studies: DG Chest 2 View  Result Date: 10/10/2021 CLINICAL DATA:  Fever. EXAM: CHEST - 2 VIEW COMPARISON:  Chest radiograph dated 07/16/2021. FINDINGS: There is diffuse chronic interstitial prominence and bronchitic changes. No focal consolidation, pleural effusion, or pneumothorax. The cardiac silhouette is within normal limits. No acute osseous pathology. IMPRESSION: No active cardiopulmonary disease. Electronically Signed   By: Anner Crete M.D.   On: 10/10/2021 23:36   CT ABDOMEN PELVIS W CONTRAST  Result Date: 10/11/2021 CLINICAL DATA:  Sepsis, fever/chills. Tender over transplant kidney. EXAM: CT ABDOMEN AND PELVIS WITH CONTRAST TECHNIQUE: Multidetector CT imaging of the abdomen and pelvis was performed using the standard protocol following bolus administration of intravenous contrast. RADIATION DOSE REDUCTION: This exam was performed according to the departmental dose-optimization program which includes automated exposure control, adjustment of the mA and/or kV according to patient size and/or use of iterative reconstruction technique. CONTRAST:  71mL OMNIPAQUE IOHEXOL 300 MG/ML  SOLN COMPARISON:  07/16/2021. FINDINGS: Lower chest: Mild atelectasis at the lung bases. Hepatobiliary: No focal liver  abnormality is seen. No gallstones, gallbladder wall thickening, or biliary dilatation. Pancreas: Unremarkable. No pancreatic ductal dilatation or surrounding inflammatory changes. Spleen: Normal in size without focal abnormality. Adrenals/Urinary Tract: The adrenal glands are within normal limits. There is atrophy of the native kidneys bilaterally. A right lower quadrant renal transplant is noted with surrounding fat stranding and edema. Mild patchy hypoenhancement is noted in the upper and lower poles of the renal transplant. No obstructive uropathy is identified. The urinary bladder is unremarkable. Stomach/Bowel: The stomach is within normal limits. No bowel obstruction, free are, or pneumatosis. No focal bowel wall thickening. A normal appendix is seen in the right lower quadrant. Vascular/Lymphatic: Aortic atherosclerosis. No enlarged abdominal or pelvic lymph nodes. Reproductive: Uterus and bilateral adnexa are unremarkable. Other: Trace amount of free fluid in the perirenal space in the right lower quadrant. Small fat containing umbilical hernia. Musculoskeletal: No acute osseous abnormality. IMPRESSION: 1. Right lower quadrant renal transplant with patchy areas of hypoenhancement in the upper and lower poles and surrounding fat stranding and perinephric edema, concerning for pyelonephritis. 2. Atrophy of the native kidneys. 3. Aortic atherosclerosis. Electronically Signed   By: Brett Fairy M.D.   On:  10/11/2021 04:30    Microbiology: Results for orders placed or performed during the hospital encounter of 10/10/21  Resp Panel by RT-PCR (Flu A&B, Covid) Nasopharyngeal Swab     Status: None   Collection Time: 10/10/21 10:06 PM   Specimen: Nasopharyngeal Swab; Nasopharyngeal(NP) swabs in vial transport medium  Result Value Ref Range Status   SARS Coronavirus 2 by RT PCR NEGATIVE NEGATIVE Final    Comment: (NOTE) SARS-CoV-2 target nucleic acids are NOT DETECTED.  The SARS-CoV-2 RNA is generally  detectable in upper respiratory specimens during the acute phase of infection. The lowest concentration of SARS-CoV-2 viral copies this assay can detect is 138 copies/mL. A negative result does not preclude SARS-Cov-2 infection and should not be used as the sole basis for treatment or other patient management decisions. A negative result may occur with  improper specimen collection/handling, submission of specimen other than nasopharyngeal swab, presence of viral mutation(s) within the areas targeted by this assay, and inadequate number of viral copies(<138 copies/mL). A negative result must be combined with clinical observations, patient history, and epidemiological information. The expected result is Negative.  Fact Sheet for Patients:  EntrepreneurPulse.com.au  Fact Sheet for Healthcare Providers:  IncredibleEmployment.be  This test is no t yet approved or cleared by the [place] FDA and  has been authorized for detection and/or diagnosis of SARS-CoV-2 by FDA under an Emergency Use Authorization (EUA). This EUA will remain  in effect (meaning this test can be used) for the duration of the COVID-19 declaration under Section 564(b)(1) of the Act, 21 U.S.C.section 360bbb-3(b)(1), unless the authorization is terminated  or revoked sooner.       Influenza A by PCR NEGATIVE NEGATIVE Final   Influenza B by PCR NEGATIVE NEGATIVE Final    Comment: (NOTE) The Xpert Xpress SARS-CoV-2/FLU/RSV plus assay is intended as an aid in the diagnosis of influenza from Nasopharyngeal swab specimens and should not be used as a sole basis for treatment. Nasal washings and aspirates are unacceptable for Xpert Xpress SARS-CoV-2/FLU/RSV testing.  Fact Sheet for Patients: EntrepreneurPulse.com.au  Fact Sheet for Healthcare Providers: IncredibleEmployment.be  This test is not yet approved or cleared by the [place] FDA  and has been authorized for detection and/or diagnosis of SARS-CoV-2 by FDA under an Emergency Use Authorization (EUA). This EUA will remain in effect (meaning this test can be used) for the duration of the COVID-19 declaration under Section 564(b)(1) of the Act, 21 U.S.C. section 360bbb-3(b)(1), unless the authorization is terminated or revoked.  Performed at [facility], [place] [address] [place], [place] [address]   Urine Culture     Status: None   Collection Time: 10/10/21 10:06 PM   Specimen: Urine, Clean Catch  Result Value Ref Range Status   Specimen Description   Final    URINE, CLEAN CATCH Performed at [facility], [place] [address] [place], [place] [address]    Special Requests   Final    NONE Performed at [facility], [place] [address], [place], [place] [address]    Culture   Final    NO GROWTH Performed at [facility], [place] [address] [place], [place] [address]    Report Status 10/12/2021 FINAL  Final  Group A Strep by PCR     Status: None   Collection Time: 10/10/21 11:50 PM   Specimen: Throat; Sterile Swab  Result Value Ref Range Status   Group A Strep by PCR NOT DETECTED NOT DETECTED Final  Comment: Performed at [facility], [place] [address], [place], [place] [address]  Blood culture (routine x 2)     Status: None (Preliminary result)   Collection Time: 10/10/21 11:50 PM   Specimen: BLOOD RIGHT FOREARM  Result Value Ref Range Status   Specimen Description   Final    BLOOD RIGHT FOREARM Performed at [place] [address], [place], [place] [address]    Special Requests   Final    BOTTLES DRAWN AEROBIC AND ANAEROBIC Blood Culture adequate volume Performed at [place] [address], [place], [place] [address]    Culture   Final    NO GROWTH 2 DAYS Performed at [place] [address], [place], [place] [address]     Report Status PENDING  Incomplete  Blood culture (routine x 2)     Status: Abnormal   Collection Time: 10/10/21 11:50 PM   Specimen: BLOOD RIGHT HAND  Result Value Ref Range Status   Specimen Description   Final    BLOOD RIGHT HAND Performed at [place] [address], [place], [place] [address]    Special Requests   Final    BOTTLES DRAWN AEROBIC AND ANAEROBIC Blood Culture adequate volume Performed at [place] [address] [place], [place] [address]    Culture  Setup Time   Final    GRAM POSITIVE COCCI AEROBIC BOTTLE ONLY CRITICAL RESULT CALLED TO, READ BACK BY AND VERIFIED WITH: PHARMD LEANN POINDEXTER 10/12/21@3 :75 BY TW    Culture (A)  Final    STAPHYLOCOCCUS HOMINIS THE SIGNIFICANCE OF ISOLATING THIS ORGANISM FROM A SINGLE SET OF BLOOD CULTURES WHEN MULTIPLE SETS ARE DRAWN IS UNCERTAIN. PLEASE NOTIFY THE MICROBIOLOGY DEPARTMENT WITHIN ONE WEEK IF SPECIATION AND SENSITIVITIES ARE REQUIRED. Performed at [facility], [place] [address] [place], [place] [address]    Report Status 10/13/2021 FINAL  Final  Blood Culture ID Panel (Reflexed)     Status: Abnormal   Collection Time: 10/10/21 11:50 PM  Result Value Ref Range Status   Enterococcus faecalis NOT DETECTED NOT DETECTED Final   Enterococcus Faecium NOT DETECTED NOT DETECTED Final   Listeria monocytogenes NOT DETECTED NOT DETECTED Final   Staphylococcus species DETECTED (A) NOT DETECTED Final    Comment: CRITICAL RESULT CALLED TO, READ BACK BY AND VERIFIED WITH: PHARMD LEANN POINDEXTER 10/12/21@3 :48 BY TW    Staphylococcus aureus (BCID) NOT DETECTED NOT DETECTED Final   Staphylococcus epidermidis NOT DETECTED NOT DETECTED Final   Staphylococcus lugdunensis NOT DETECTED NOT DETECTED Final   Streptococcus species NOT DETECTED NOT DETECTED Final   Streptococcus agalactiae NOT DETECTED NOT DETECTED Final   Streptococcus pneumoniae NOT DETECTED NOT DETECTED Final    Streptococcus pyogenes NOT DETECTED NOT DETECTED Final   A.calcoaceticus-baumannii NOT DETECTED NOT DETECTED Final   Bacteroides fragilis NOT DETECTED NOT DETECTED Final   Enterobacterales NOT DETECTED NOT DETECTED Final   Enterobacter cloacae complex NOT DETECTED NOT DETECTED Final   Escherichia coli NOT DETECTED NOT DETECTED Final   Klebsiella aerogenes NOT DETECTED NOT DETECTED Final   Klebsiella oxytoca NOT DETECTED NOT DETECTED Final   Klebsiella pneumoniae NOT DETECTED NOT DETECTED Final   Proteus species NOT DETECTED NOT DETECTED Final   Salmonella species NOT DETECTED NOT DETECTED Final   Serratia marcescens NOT DETECTED NOT DETECTED Final   Haemophilus influenzae NOT DETECTED NOT DETECTED Final   Neisseria meningitidis NOT DETECTED NOT DETECTED Final  Pseudomonas aeruginosa NOT DETECTED NOT DETECTED Final   Stenotrophomonas maltophilia NOT DETECTED NOT DETECTED Final   Candida albicans NOT DETECTED NOT DETECTED Final   Candida auris NOT DETECTED NOT DETECTED Final   Candida glabrata NOT DETECTED NOT DETECTED Final   Candida krusei NOT DETECTED NOT DETECTED Final   Candida parapsilosis NOT DETECTED NOT DETECTED Final   Candida tropicalis NOT DETECTED NOT DETECTED Final   Cryptococcus neoformans/gattii NOT DETECTED NOT DETECTED Final    Comment: Performed at [facility], [place] [address], [place], [place] [address]  Respiratory (~20 pathogens) panel by PCR     Status: None   Collection Time: 10/11/21  1:48 PM   Specimen: Nasopharyngeal Swab; Respiratory  Result Value Ref Range Status   Adenovirus NOT DETECTED NOT DETECTED Final   Coronavirus 229E NOT DETECTED NOT DETECTED Final   Coronavirus HKU1 NOT DETECTED NOT DETECTED Final   Coronavirus NL63 NOT DETECTED NOT DETECTED Final   Coronavirus OC43 NOT DETECTED NOT DETECTED Final   Metapneumovirus NOT DETECTED NOT DETECTED Final   Rhinovirus / Enterovirus NOT DETECTED NOT DETECTED Final   Influenza B NOT DETECTED NOT  DETECTED Final   Parainfluenza Virus 1 NOT DETECTED NOT DETECTED Final   Parainfluenza Virus 2 NOT DETECTED NOT DETECTED Final   Parainfluenza Virus 3 NOT DETECTED NOT DETECTED Final   Parainfluenza Virus 4 NOT DETECTED NOT DETECTED Final   Respiratory Syncytial Virus NOT DETECTED NOT DETECTED Final   Bordetella pertussis NOT DETECTED NOT DETECTED Final   Bordetella Parapertussis NOT DETECTED NOT DETECTED Final   Chlamydophila pneumoniae NOT DETECTED NOT DETECTED Final   Mycoplasma pneumoniae NOT DETECTED NOT DETECTED Final    Comment: Performed at [facility], [place] [address], [place], [place] [address]    Labs: CBC: Recent Labs  Lab 10/10/21 2206 10/12/21 0507  WBC 15.7* 10.4  NEUTROABS 13.3*  --   HGB 13.6 13.1  HCT 41.1 40.5  MCV 86.7 88.0  PLT 178 361*   Basic Metabolic Panel: Recent Labs  Lab 10/10/21 2206 10/11/21 0521 10/12/21 0507 10/13/21 0513  NA 132* 133* 134* 135  K 3.7 3.5 4.3 3.7  CL 105 107 108 109  CO2 18* 20* 17* 20*  GLUCOSE 116* 100* 115* 110*  BUN 16 11 11 17   CREATININE 0.87 0.83 0.69 0.90  CALCIUM 9.2 8.6* 9.4 9.4   Liver Function Tests: Recent Labs  Lab 10/10/21 2206  AST 28  ALT 30  ALKPHOS 106  BILITOT 0.4  PROT 7.4  ALBUMIN 4.1   CBG: No results for input(s): GLUCAP in the last 168 hours.  Discharge time spent: greater than 30 minutes.  Signed: Murray Hodgkins, MD [facility] 10/13/2021

## 2021-10-15 LAB — TACROLIMUS LEVEL: Tacrolimus (FK506) - LabCorp: 9 ng/mL (ref 2.0–20.0)

## 2021-10-16 LAB — CULTURE, BLOOD (ROUTINE X 2)
Culture: NO GROWTH
Special Requests: ADEQUATE

## 2021-10-28 ENCOUNTER — Emergency Department (HOSPITAL_COMMUNITY)
Admission: EM | Admit: 2021-10-28 | Discharge: 2021-10-28 | Disposition: A | Discharge status: Home / Self Care | Payer: BLUE CROSS/BLUE SHIELD | Attending: Emergency Medicine | Admitting: Emergency Medicine

## 2021-10-28 ENCOUNTER — Other Ambulatory Visit: Payer: Self-pay

## 2021-10-28 ENCOUNTER — Encounter (HOSPITAL_COMMUNITY): Payer: Self-pay

## 2021-10-28 DIAGNOSIS — N186 End stage renal disease: Secondary | ICD-10-CM | POA: Diagnosis not present

## 2021-10-28 DIAGNOSIS — Z940 Kidney transplant status: Secondary | ICD-10-CM | POA: Insufficient documentation

## 2021-10-28 DIAGNOSIS — I120 Hypertensive chronic kidney disease with stage 5 chronic kidney disease or end stage renal disease: Secondary | ICD-10-CM | POA: Diagnosis not present

## 2021-10-28 DIAGNOSIS — R519 Headache, unspecified: Secondary | ICD-10-CM | POA: Diagnosis present
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
## 2021-10-28 NOTE — Discharge Instructions (Addendum)
Usted fue evaluado en el [facility] y luego de una evaluaci?n cuidadosa, no encontramos ninguna condici?n emergente que requiera ingreso o pruebas adicionales en el hospital. ? ?Su evaluaci?n de hoy fue en general tranquilizadora. Tome Tylenol o ibuprofeno para sus s?ntomas. ? ?Regrese al [facility] si experimenta alg?n empeoramiento de su condici?n. Lo alentamos a que haga un seguimiento con un proveedor de atenci?n primaria. Gracias por permitirnos ser parte de su cuidado ? ? ?You were evaluated in the Emergency Department and after careful evaluation, we did not find any emergent condition requiring admission or further testing in the hospital. ? ?Your evaluation today was overall reassuring.  Please take Tylenol or ibuprofen for your symptoms. ? ?Please return to the Emergency Department if you experience any worsening of your condition.  We encourage you to follow up with a primary care provider.  Thank you for allowing [place] to be a part of your care. ? ?
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
## 2021-11-03 DIAGNOSIS — I770 Arteriovenous fistula, acquired: Secondary | ICD-10-CM | POA: Insufficient documentation

## 2021-11-03 DIAGNOSIS — T7411XA Adult physical abuse, confirmed, initial encounter: Secondary | ICD-10-CM | POA: Insufficient documentation

## 2022-08-22 ENCOUNTER — Emergency Department (HOSPITAL_COMMUNITY)
Admission: EM | Admit: 2022-08-22 | Discharge: 2022-08-22 | Disposition: A | Discharge status: Home / Self Care | Payer: Self-pay | Attending: Emergency Medicine | Admitting: Emergency Medicine

## 2022-08-22 ENCOUNTER — Emergency Department (HOSPITAL_COMMUNITY): Payer: Self-pay

## 2022-08-22 ENCOUNTER — Other Ambulatory Visit: Payer: Self-pay

## 2022-08-22 ENCOUNTER — Encounter (HOSPITAL_COMMUNITY): Payer: Self-pay

## 2022-08-22 DIAGNOSIS — J111 Influenza due to unidentified influenza virus with other respiratory manifestations: Secondary | ICD-10-CM

## 2022-08-22 DIAGNOSIS — J101 Influenza due to other identified influenza virus with other respiratory manifestations: Secondary | ICD-10-CM | POA: Insufficient documentation

## 2022-08-22 DIAGNOSIS — Z1152 Encounter for screening for COVID-19: Secondary | ICD-10-CM | POA: Insufficient documentation

## 2022-08-22 DIAGNOSIS — Z940 Kidney transplant status: Secondary | ICD-10-CM | POA: Insufficient documentation

## 2022-08-22 LAB — COMPREHENSIVE METABOLIC PANEL
ALT: 37 U/L (ref 0–44)
AST: 27 U/L (ref 15–41)
Albumin: 3.9 g/dL (ref 3.5–5.0)
Alkaline Phosphatase: 108 U/L (ref 38–126)
Anion gap: 10 (ref 5–15)
BUN: 16 mg/dL (ref 6–20)
CO2: 17 mmol/L — ABNORMAL LOW (ref 22–32)
Calcium: 9.4 mg/dL (ref 8.9–10.3)
Chloride: 107 mmol/L (ref 98–111)
Creatinine, Ser: 0.8 mg/dL (ref 0.44–1.00)
GFR, Estimated: >60 mL/min (ref >60)
Glucose, Bld: 147 mg/dL — ABNORMAL HIGH (ref 70–99)
Potassium: 3.6 mmol/L (ref 3.5–5.1)
Sodium: 134 mmol/L — ABNORMAL LOW (ref 135–145)
Total Bilirubin: 0.3 mg/dL (ref 0.3–1.2)
Total Protein: 7.7 g/dL (ref 6.5–8.1)

## 2022-08-22 LAB — CBC WITH DIFFERENTIAL/PLATELET
Abs Immature Granulocytes: 0.05 10*3/uL (ref 0.00–0.07)
Basophils Absolute: 0 10*3/uL (ref 0.0–0.1)
Basophils Relative: 0 %
Eosinophils Absolute: 0 10*3/uL (ref 0.0–0.5)
Eosinophils Relative: 0 %
HCT: 43.5 % (ref 36.0–46.0)
Hemoglobin: 14.2 g/dL (ref 12.0–15.0)
Immature Granulocytes: 1 %
Lymphocytes Relative: 17 %
Lymphs Abs: 1.8 10*3/uL (ref 0.7–4.0)
MCH: 28.5 pg (ref 26.0–34.0)
MCHC: 32.6 g/dL (ref 30.0–36.0)
MCV: 87.3 fL (ref 80.0–100.0)
Monocytes Absolute: 1.2 10*3/uL — ABNORMAL HIGH (ref 0.1–1.0)
Monocytes Relative: 11 %
Neutro Abs: 7.6 10*3/uL (ref 1.7–7.7)
Neutrophils Relative %: 71 %
Platelets: 201 10*3/uL (ref 150–400)
RBC: 4.98 MIL/uL (ref 3.87–5.11)
RDW: 13.3 % (ref 11.5–15.5)
WBC: 10.7 10*3/uL — ABNORMAL HIGH (ref 4.0–10.5)
nRBC: 0 % (ref 0.0–0.2)

## 2022-08-22 LAB — RESP PANEL BY RT-PCR (RSV, FLU A&B, COVID)  RVPGX2
Influenza A by PCR: NEGATIVE
Influenza B by PCR: POSITIVE — AB
Resp Syncytial Virus by PCR: NEGATIVE
SARS Coronavirus 2 by RT PCR: NEGATIVE

## 2022-08-22 MED ORDER — OXYCODONE HCL 5 MG PO TABS
10.0000 mg | ORAL_TABLET | Freq: Once | ORAL | Status: AC
  Administered 2022-08-22: 10 mg via ORAL
  Filled 2022-08-22: qty 2

## 2022-08-22 MED ORDER — OSELTAMIVIR PHOSPHATE 75 MG PO CAPS: 75.0000 mg | ORAL_CAPSULE | Freq: Two times a day (BID) | ORAL | 0 refills | Status: DC

## 2022-08-22 MED ORDER — BENZONATATE 100 MG PO CAPS: 100.0000 mg | ORAL_CAPSULE | Freq: Three times a day (TID) | ORAL | 0 refills | Status: DC

## 2022-08-22 MED ORDER — ACETAMINOPHEN 325 MG PO TABS
650.0000 mg | ORAL_TABLET | Freq: Once | ORAL | Status: AC
  Administered 2022-08-22: 650 mg via ORAL
  Filled 2022-08-22: qty 2

## 2022-08-22 MED ORDER — OSELTAMIVIR PHOSPHATE 75 MG PO CAPS
75.0000 mg | ORAL_CAPSULE | Freq: Once | ORAL | Status: AC
  Administered 2022-08-22: 75 mg via ORAL
  Filled 2022-08-22: qty 1

## 2022-08-22 NOTE — ED Provider Notes (Addendum)
[facility] DEPT Provider Note   CSN: [id number] Arrival date & time: 08/22/22  1102     History Chief Complaint  Patient presents with   Generalized Body Aches    Katie Lyons is a 37 y.o. female.  HPI Patient presents emergency department complaints of generalized bodyaches for several days.  She reports that she has had sick contacts at home.  Patient previously had a temperature of 101 degrees at home and reports that she has not had significant relief in temperature control her pain with 1000 mg of Tylenol.  Patient reports chest pain associated with coughing, increased work of breathing, fever and chills.  Patient denies nausea, vomiting, diarrhea.  Patient reports that she is a kidney transplant recipient.    Home Medications Prior to Admission medications   Medication Sig Start Date End Date Taking? Authorizing Provider  benzonatate (TESSALON) 100 MG capsule Take 1 capsule (100 mg total) by mouth every 8 (eight) hours. 08/22/22  Yes Luvenia Heller, PA-C  oseltamivir (TAMIFLU) 75 MG capsule Take 1 capsule (75 mg total) by mouth every 12 (twelve) hours. 08/22/22  Yes Luvenia Heller, PA-C  acetaminophen (TYLENOL) 500 MG tablet Take 500 mg by mouth every 6 (six) hours as needed for mild pain or headache.    [provider]  cefdinir (OMNICEF) 300 MG capsule Take 1 capsule (300 mg total) by mouth 2 (two) times daily. 10/13/21   Samuella Cota, MD  magnesium oxide (MAG-OX) 400 MG tablet Take 800 mg by mouth daily.    [provider]  predniSONE (DELTASONE) 5 MG tablet Take 5 mg by mouth daily with breakfast.    [provider]  sodium bicarbonate 650 MG tablet Take 2,600 mg by mouth 3 (three) times daily. Four tablets three times a day 05/20/18   [provider]  tacrolimus (PROGRAF) 1 MG capsule Take 6-7 mg by mouth See admin instructions. Take 7 tablets in the AM and 6 tablets in the PM. 10/07/18   [provider]      Allergies    Patient has no known allergies.    Review of Systems   Review of Systems  Constitutional:  Positive for chills and fever.  HENT:  Negative for ear pain.   Respiratory:  Positive for cough. Negative for chest tightness and shortness of breath.   Cardiovascular:  Negative for chest pain.  Gastrointestinal:  Negative for diarrhea, nausea and vomiting.  Genitourinary:  Negative for dysuria.  All other systems reviewed and are negative.   Physical Exam Updated Vital Signs BP 110/70   Pulse 71   Temp 99.6 F (37.6 C) (Oral)   Resp 16   SpO2 97%  Physical Exam Vitals and nursing note reviewed.  Constitutional:      Appearance: Normal appearance.     Comments: Patient appears physically uncomfortable and is complaining about generalized pain everywhere in her body.  HENT:     Head: Normocephalic and atraumatic.     Nose: Nose normal.     Mouth/Throat:     Mouth: Mucous membranes are moist.     Pharynx: Oropharynx is clear.  Eyes:     Conjunctiva/sclera: Conjunctivae normal.  Cardiovascular:     Rate and Rhythm: Normal rate and regular rhythm.  Pulmonary:     Effort: No respiratory distress.     Breath sounds: Normal breath sounds. No stridor. No wheezing.  Abdominal:     General: Abdomen is flat.  Skin:  General: Skin is warm and dry.     Capillary Refill: Capillary refill takes less than 2 seconds.  Neurological:     General: No focal deficit present.     Mental Status: She is alert.     ED Results / Procedures / Treatments   Labs (all labs ordered are listed, but only abnormal results are displayed) Labs Reviewed  RESP PANEL BY RT-PCR (RSV, FLU A&B, COVID)  RVPGX2 - Abnormal; Notable for the following components:      Result Value   Influenza B by PCR POSITIVE (*)    All other components within normal limits  COMPREHENSIVE METABOLIC PANEL - Abnormal; Notable for the following components:   Sodium 134 (*)    CO2 17 (*)     Glucose, Bld 147 (*)    All other components within normal limits  CBC WITH DIFFERENTIAL/PLATELET - Abnormal; Notable for the following components:   WBC 10.7 (*)    Monocytes Absolute 1.2 (*)    All other components within normal limits    EKG None  Radiology DG Chest 2 View  Result Date: 08/22/2022 CLINICAL DATA:  Cough, fever EXAM: CHEST - 2 VIEW COMPARISON:  10/10/2021 FINDINGS: Transverse diameter of heart is increased. There are no signs of pulmonary edema or focal pulmonary consolidation. There is no pleural effusion or pneumothorax. IMPRESSION: Cardiomegaly. There are no signs of pulmonary edema or focal pulmonary consolidation. Electronically Signed   By: Elmer Picker M.D.   On: 08/22/2022 12:55    Procedures Procedures   Medications Ordered in ED Medications  oseltamivir (TAMIFLU) capsule 75 mg (75 mg Oral Given 08/22/22 1526)  acetaminophen (TYLENOL) tablet 650 mg (650 mg Oral Given 08/22/22 1539)  oxyCODONE (Oxy IR/ROXICODONE) immediate release tablet 10 mg (10 mg Oral Given 08/22/22 1743)    ED Course/ Medical Decision Making/ A&P Clinical Course as of 08/22/22 1818  Tue Aug 22, 2022  1527 Body aches and Flu+ [CC]    Clinical Course User Index [CC] Tretha Sciara, MD                           Medical Decision Making Risk OTC drugs. Prescription drug management.   This patient presents to the ED for concern of generalized bodyaches.  Differential diagnosis includes influenza, viral URI, sepsis, autoimmune condition   Lab Tests:  I Ordered, and personally interpreted labs.  The pertinent results include: Influenza positive, mild leukocytosis, preserved GFR   Imaging Studies ordered:  I ordered imaging studies including chest x-ray I independently visualized and interpreted imaging which showed negative for cardiopulmonary disease I agree with the radiologist interpretation   Medicines ordered and prescription drug management:  I ordered  medication including Tamiflu, oxycodone, Tessalon for influenza, pain, cough Reevaluation of the patient after these medicines showed that the patient improved I have reviewed the patients home medicines and have made adjustments as needed   Problem List / ED Course:  The emergency department complaints of generalized bodyaches for a few days.  Patient reports that her body aches are fairly severe and has had a fever of 101 F at home.  She has been taking Tylenol as needed for symptoms but has not achieve ideal symptom control.  Patient is a kidney transplant recipient but has normal kidney function at this time.  Based on her presentation and transplant status, Tamiflu prescription was sent for a duration of 5 days to reduce the risk of complications from  influenza infection.  Patient was agreeable to plan to discharge home and verbalized understanding all return precautions.  Final Clinical Impression(s) / ED Diagnoses Final diagnoses:  Influenza    Rx / DC Orders ED Discharge Orders          Ordered    oseltamivir (TAMIFLU) 75 MG capsule  Every 12 hours        08/22/22 1812    benzonatate (TESSALON) 100 MG capsule  Every 8 hours        08/22/22 1812              Luvenia Heller, PA-C 08/22/22 1816    Luvenia Heller, PA-C 08/22/22 1818    Tretha Sciara, MD 08/25/22 1505

## 2022-08-22 NOTE — Discharge Instructions (Signed)
Hoy lo atendieron en el departamento de emergencias con un diagnstico de influenza. Es probable que esto explique sus dolores corporales generalizados y Conservation officer, historic buildings en su cuerpo. Debe controlar los sntomas en casa con Tylenol segn sea necesario. Si puede tomar [employer] ibuprofeno o naproxeno, estos medicamentos tambin pueden ser tiles para reducir Conservation officer, historic buildings y la [place], por lo que debe consultar con su nefrlogo para asegurarse de que sea seguro hacerlo dado su estado de trasplante de rin. Regrese al departamento de emergencias si tiene quejas o inquietudes sobre dificultad para Production manager.  You are seen in the emergency department today diagnosed with influenza.  This is likely to explain your generalized bodyaches and pain by her body.  You should manage symptoms at home with Tylenol as needed.  If you are able to take NSAIDs such as ibuprofen or naproxen, these medications can also be helpful in reducing pain and fever that he should double check with your nephrologist to ensure that this is safe for you to do given your kidney transplant status.  Please return back to the emergency department for complaints or concerns of significant shortness of breath.

## 2022-08-22 NOTE — ED Triage Notes (Addendum)
Patient said she has body aches, cough and fever at home of 101F. Took tylenol at 10pm.

## 2022-08-22 NOTE — ED Provider Triage Note (Signed)
Emergency Medicine Provider Triage Evaluation Note  Katie Lyons , a 37 y.o. female  was evaluated in triage.  Pt complains of malaise and coughing.  Patient s/p kidney transplant. Symptoms began last night.  Patient endorses sick contacts at home.  Patient had a temperature of 101 at home and is taken 1000 mg Tylenol to no relief.  Patient endorsed chest pain with coughing, shortness of breath, fever/chills.  Patient denied dysuria, headache, syncope, hemoptysis, nausea,vomiting, diarrhea  Review of Systems  Positive: See HPI Negative: See HPI  Physical Exam  BP (!) 150/94 (BP Location: Left Arm)   Pulse 93   Temp 98.9 F (37.2 C) (Oral)   Resp 20   SpO2 100%  Gen:   Awake, no distress Resp:  Persistent coughing, non-productive cough MSK:   Moves extremities without difficulty  ABD:  Non TTP CV:  Regular rate and rhythm MSK:  CP reproducible with palpation  Medical Decision Making  Medically screening exam initiated at 11:41 AM.  Appropriate orders placed.  Katie Lyons was informed that the remainder of the evaluation will be completed by another provider, this initial triage assessment does not replace that evaluation, and the importance of remaining in the ED until their evaluation is complete.   Chuck Hint, PA-C 08/22/22 1145

## 2022-12-28 ENCOUNTER — Emergency Department (HOSPITAL_COMMUNITY): Payer: Medicaid Other

## 2022-12-28 ENCOUNTER — Other Ambulatory Visit: Payer: Self-pay

## 2022-12-28 ENCOUNTER — Inpatient Hospital Stay (HOSPITAL_COMMUNITY)
Admission: EM | Admit: 2022-12-28 | Discharge: 2023-01-02 | DRG: 872 | Disposition: A | Discharge status: Home Health | Payer: Medicaid Other | Attending: Internal Medicine | Admitting: Internal Medicine

## 2022-12-28 ENCOUNTER — Encounter (HOSPITAL_COMMUNITY): Payer: Self-pay | Admitting: Emergency Medicine

## 2022-12-28 DIAGNOSIS — N390 Urinary tract infection, site not specified: Secondary | ICD-10-CM | POA: Diagnosis present

## 2022-12-28 DIAGNOSIS — E871 Hypo-osmolality and hyponatremia: Secondary | ICD-10-CM | POA: Diagnosis present

## 2022-12-28 DIAGNOSIS — E872 Acidosis, unspecified: Secondary | ICD-10-CM | POA: Diagnosis present

## 2022-12-28 DIAGNOSIS — K219 Gastro-esophageal reflux disease without esophagitis: Secondary | ICD-10-CM | POA: Diagnosis present

## 2022-12-28 DIAGNOSIS — Z7952 Long term (current) use of systemic steroids: Secondary | ICD-10-CM | POA: Diagnosis not present

## 2022-12-28 DIAGNOSIS — R739 Hyperglycemia, unspecified: Secondary | ICD-10-CM | POA: Diagnosis present

## 2022-12-28 DIAGNOSIS — E861 Hypovolemia: Secondary | ICD-10-CM | POA: Diagnosis present

## 2022-12-28 DIAGNOSIS — I255 Ischemic cardiomyopathy: Secondary | ICD-10-CM | POA: Diagnosis present

## 2022-12-28 DIAGNOSIS — Z79899 Other long term (current) drug therapy: Secondary | ICD-10-CM

## 2022-12-28 DIAGNOSIS — R7401 Elevation of levels of liver transaminase levels: Secondary | ICD-10-CM | POA: Diagnosis present

## 2022-12-28 DIAGNOSIS — R7989 Other specified abnormal findings of blood chemistry: Secondary | ICD-10-CM | POA: Diagnosis present

## 2022-12-28 DIAGNOSIS — T8619 Other complication of kidney transplant: Secondary | ICD-10-CM

## 2022-12-28 DIAGNOSIS — Z79621 Long term (current) use of calcineurin inhibitor: Secondary | ICD-10-CM

## 2022-12-28 DIAGNOSIS — A4151 Sepsis due to Escherichia coli [E. coli]: Principal | ICD-10-CM | POA: Diagnosis present

## 2022-12-28 DIAGNOSIS — N179 Acute kidney failure, unspecified: Secondary | ICD-10-CM | POA: Diagnosis present

## 2022-12-28 DIAGNOSIS — Y830 Surgical operation with transplant of whole organ as the cause of abnormal reaction of the patient, or of later complication, without mention of misadventure at the time of the procedure: Secondary | ICD-10-CM | POA: Diagnosis present

## 2022-12-28 DIAGNOSIS — N10 Acute tubulo-interstitial nephritis: Secondary | ICD-10-CM | POA: Diagnosis present

## 2022-12-28 DIAGNOSIS — D84821 Immunodeficiency due to drugs: Secondary | ICD-10-CM | POA: Diagnosis present

## 2022-12-28 DIAGNOSIS — T8613 Kidney transplant infection: Secondary | ICD-10-CM | POA: Diagnosis present

## 2022-12-28 DIAGNOSIS — D6959 Other secondary thrombocytopenia: Secondary | ICD-10-CM | POA: Diagnosis present

## 2022-12-28 DIAGNOSIS — Z1612 Extended spectrum beta lactamase (ESBL) resistance: Secondary | ICD-10-CM | POA: Diagnosis present

## 2022-12-28 DIAGNOSIS — I10 Essential (primary) hypertension: Secondary | ICD-10-CM | POA: Diagnosis present

## 2022-12-28 DIAGNOSIS — N12 Tubulo-interstitial nephritis, not specified as acute or chronic: Secondary | ICD-10-CM | POA: Diagnosis present

## 2022-12-28 LAB — BLOOD CULTURE ID PANEL (REFLEXED) - BCID2

## 2022-12-28 LAB — CBC WITH DIFFERENTIAL/PLATELET
Abs Immature Granulocytes: 0.07 10*3/uL (ref 0.00–0.07)
Basophils Absolute: 0 10*3/uL (ref 0.0–0.1)
Basophils Relative: 0 %
Eosinophils Absolute: 0.1 10*3/uL (ref 0.0–0.5)
Eosinophils Relative: 0 %
HCT: 40.8 % (ref 36.0–46.0)
Hemoglobin: 13.3 g/dL (ref 12.0–15.0)
Immature Granulocytes: 1 %
Lymphocytes Relative: 5 %
Lymphs Abs: 0.8 10*3/uL (ref 0.7–4.0)
MCH: 27.4 pg (ref 26.0–34.0)
MCHC: 32.6 g/dL (ref 30.0–36.0)
MCV: 84.1 fL (ref 80.0–100.0)
Monocytes Absolute: 0.4 10*3/uL (ref 0.1–1.0)
Monocytes Relative: 3 %
Neutro Abs: 12.9 10*3/uL — ABNORMAL HIGH (ref 1.7–7.7)
Neutrophils Relative %: 91 %
Platelets: 186 10*3/uL (ref 150–400)
RBC: 4.85 MIL/uL (ref 3.87–5.11)
RDW: 13.6 % (ref 11.5–15.5)
WBC: 14.2 10*3/uL — ABNORMAL HIGH (ref 4.0–10.5)
nRBC: 0 % (ref 0.0–0.2)

## 2022-12-28 LAB — URINALYSIS, ROUTINE W REFLEX MICROSCOPIC
Bilirubin Urine: NEGATIVE
Glucose, UA: NEGATIVE mg/dL
Ketones, ur: NEGATIVE mg/dL
Nitrite: POSITIVE — AB
Protein, ur: 30 mg/dL — AB
Specific Gravity, Urine: 1.019 (ref 1.005–1.030)
WBC, UA: >50 WBC/hpf (ref 0–5)
pH: 7 (ref 5.0–8.0)

## 2022-12-28 LAB — COMPREHENSIVE METABOLIC PANEL
ALT: 48 U/L — ABNORMAL HIGH (ref 0–44)
AST: 70 U/L — ABNORMAL HIGH (ref 15–41)
Albumin: 3.7 g/dL (ref 3.5–5.0)
Alkaline Phosphatase: 107 U/L (ref 38–126)
Anion gap: 6 (ref 5–15)
BUN: 20 mg/dL (ref 6–20)
CO2: 19 mmol/L — ABNORMAL LOW (ref 22–32)
Calcium: 8.8 mg/dL — ABNORMAL LOW (ref 8.9–10.3)
Chloride: 103 mmol/L (ref 98–111)
Creatinine, Ser: 0.93 mg/dL (ref 0.44–1.00)
GFR, Estimated: >60 mL/min (ref >60)
Glucose, Bld: 160 mg/dL — ABNORMAL HIGH (ref 70–99)
Potassium: 3.5 mmol/L (ref 3.5–5.1)
Sodium: 128 mmol/L — ABNORMAL LOW (ref 135–145)
Total Bilirubin: 0.9 mg/dL (ref 0.3–1.2)
Total Protein: 7.3 g/dL (ref 6.5–8.1)

## 2022-12-28 LAB — LIPASE, BLOOD: Lipase: 26 U/L (ref 11–51)

## 2022-12-28 LAB — PREGNANCY, URINE: Preg Test, Ur: NEGATIVE

## 2022-12-28 LAB — LACTIC ACID, PLASMA
Lactic Acid, Venous: 1.7 mmol/L (ref 0.5–1.9)
Lactic Acid, Venous: 0.7 mmol/L (ref 0.5–1.9)

## 2022-12-28 MED ORDER — ACETAMINOPHEN 325 MG PO TABS
650.0000 mg | ORAL_TABLET | Freq: Four times a day (QID) | ORAL | Status: DC | PRN
  Administered 2022-12-28 – 2023-01-01 (×8): 650 mg via ORAL
  Filled 2022-12-28 (×10): qty 2

## 2022-12-28 MED ORDER — FENTANYL CITRATE PF 50 MCG/ML IJ SOSY
50.0000 ug | PREFILLED_SYRINGE | Freq: Once | INTRAMUSCULAR | Status: AC
  Administered 2022-12-28: 50 ug via INTRAVENOUS
  Filled 2022-12-28: qty 1

## 2022-12-28 MED ORDER — NALOXONE HCL 0.4 MG/ML IJ SOLN: 0.4000 mg | INTRAMUSCULAR | Status: DC | PRN

## 2022-12-28 MED ORDER — HYDROMORPHONE HCL 1 MG/ML IJ SOLN
1.0000 mg | Freq: Once | INTRAMUSCULAR | Status: AC
  Administered 2022-12-28: 1 mg via INTRAVENOUS
  Filled 2022-12-28: qty 1

## 2022-12-28 MED ORDER — SODIUM BICARBONATE 650 MG PO TABS
2600.0000 mg | ORAL_TABLET | Freq: Three times a day (TID) | ORAL | Status: DC
  Administered 2022-12-28 – 2023-01-02 (×16): 2600 mg via ORAL
  Filled 2022-12-28 (×15): qty 4

## 2022-12-28 MED ORDER — OXYCODONE HCL 5 MG PO TABS
5.0000 mg | ORAL_TABLET | Freq: Four times a day (QID) | ORAL | Status: DC | PRN
  Administered 2022-12-28 – 2022-12-30 (×3): 5 mg via ORAL
  Filled 2022-12-28 (×3): qty 1

## 2022-12-28 MED ORDER — PREDNISONE 5 MG PO TABS
5.0000 mg | ORAL_TABLET | Freq: Every day | ORAL | Status: DC
  Administered 2022-12-28 – 2023-01-02 (×6): 5 mg via ORAL
  Filled 2022-12-28 (×6): qty 1

## 2022-12-28 MED ORDER — ONDANSETRON HCL 4 MG PO TABS: 4.0000 mg | ORAL_TABLET | Freq: Four times a day (QID) | ORAL | Status: DC | PRN

## 2022-12-28 MED ORDER — SODIUM CHLORIDE 0.9 % IV SOLN: INTRAVENOUS | Status: AC

## 2022-12-28 MED ORDER — SODIUM CHLORIDE 0.9 % IV SOLN
1.0000 g | Freq: Three times a day (TID) | INTRAVENOUS | Status: DC
  Administered 2022-12-28 – 2022-12-30 (×6): 1 g via INTRAVENOUS
  Filled 2022-12-28 (×6): qty 20

## 2022-12-28 MED ORDER — LACTATED RINGERS IV BOLUS
1000.0000 mL | Freq: Once | INTRAVENOUS | Status: AC
  Administered 2022-12-28: 1000 mL via INTRAVENOUS

## 2022-12-28 MED ORDER — ACETAMINOPHEN 650 MG RE SUPP: 650.0000 mg | Freq: Four times a day (QID) | RECTAL | Status: DC | PRN

## 2022-12-28 MED ORDER — SODIUM CHLORIDE 0.9 % IV SOLN
1.0000 g | Freq: Once | INTRAVENOUS | Status: AC
  Administered 2022-12-28: 1 g via INTRAVENOUS
  Filled 2022-12-28: qty 20

## 2022-12-28 MED ORDER — ENOXAPARIN SODIUM 40 MG/0.4ML IJ SOSY
40.0000 mg | PREFILLED_SYRINGE | INTRAMUSCULAR | Status: DC
  Filled 2022-12-28 (×3): qty 0.4

## 2022-12-28 MED ORDER — FENTANYL CITRATE PF 50 MCG/ML IJ SOSY
50.0000 ug | PREFILLED_SYRINGE | INTRAMUSCULAR | Status: DC | PRN
  Administered 2022-12-28: 50 ug via INTRAVENOUS
  Filled 2022-12-28: qty 1

## 2022-12-28 MED ORDER — ONDANSETRON HCL 4 MG/2ML IJ SOLN
4.0000 mg | Freq: Once | INTRAMUSCULAR | Status: AC
  Administered 2022-12-28: 4 mg via INTRAVENOUS
  Filled 2022-12-28: qty 2

## 2022-12-28 MED ORDER — ONDANSETRON HCL 4 MG/2ML IJ SOLN: 4.0000 mg | Freq: Four times a day (QID) | INTRAMUSCULAR | Status: DC | PRN

## 2022-12-28 MED ORDER — HYDROMORPHONE HCL 1 MG/ML IJ SOLN
1.0000 mg | INTRAMUSCULAR | Status: AC | PRN
  Administered 2022-12-28 – 2022-12-29 (×3): 1 mg via INTRAVENOUS
  Filled 2022-12-28 (×3): qty 1

## 2022-12-28 MED ORDER — FAMOTIDINE 20 MG PO TABS
20.0000 mg | ORAL_TABLET | Freq: Every day | ORAL | Status: DC
  Administered 2022-12-28 – 2023-01-01 (×5): 20 mg via ORAL
  Filled 2022-12-28 (×5): qty 1

## 2022-12-28 NOTE — H&P (Signed)
History and Physical    Patient: Katie Lyons ZOX:[id number] DOB: [birth year] DOA: 12/28/2022 DOS: the patient was seen and examined on 12/28/2022 PCP: Eunice Blase, Kirstie Peri, MD  Patient coming from: Home  Chief Complaint:  Chief Complaint  Patient presents with   Fever   Dysuria   HPI: Katie Lyons is a 37 y.o. female with medical history significant of anxiety, depression, chronic ischemic heart disease,, headaches, hypertension, iron deficiency anemia, secondary hyperparathyroidism, traumatic intracerebral hemorrhage with brain injury, GERD, end-stage renal disease, history of kidney transplant, history of pyelonephritis of transplanted kidney who presented to the emergency department with complaints of fever, body aches, fatigue and dysuria.  She stated she forgot to take a dose of methenamine yesterday. No flank pain, frequency or hematuria.  No sore throat, rhinorrhea, dyspnea, wheezing or hemoptysis.  No chest pain, palpitations, diaphoresis, PND, orthopnea or pitting edema of the lower extremities.  No appetite changes, abdominal pain, diarrhea, constipation, melena or hematochezia.   No polyuria, polydipsia, polyphagia or blurred vision.  ED course: Initial vital signs were temperature 101.2 F, pulse 97, respiration 18, BP 153/97 mmHg O2 sat 100% on room air.  Patient received LR 1000 mL bolus, fentanyl 50 mcg IVP x 1, ondansetron 4 mg IVP and was started on meropenem.  Lab work: Urinalysis was cloudy with large hemoglobin and leukocyte esterase.  There was proteinuria 30 mg/dL and positive nitrites.  Microscopic examination showed 21-50 RBCs, more than 50 WBC, many bacteria with positive WBC clumps and budding yeast.  Urine pregnancy test was negative.  CBC showed a white count of 14.2 with 91% neutrophils, hemoglobin 13.3 g/dL and platelets 811.  Lipase is normal.  CMP with a sodium 128, potassium 3.5, chloride 103 and CO2 of 19 mmol/L with a normal anion  gap.  Glucose 160 mg/dL.  Renal function was normal.  AST 70 and ALT 48 units/L.  The rest of the hepatic functions were normal.  Lactic acid x 2 normal.  Imaging: 2 view chest radiograph with no acute cardiopulmonary disease evidence.  CT renal study showing right lower quadrant renal transplant appears inflamed but is not obviously obstructive.  No other acute or inflammatory process identified in the noncontrast abdomen or pelvis.  Chronic L5 pars fractures.  Aortic atherosclerosis.   Review of Systems: As mentioned in the history of present illness. All other systems reviewed and are negative. Past Medical History:  Diagnosis Date   Anxiety    Chronic ischemic heart disease    Depression    3-4 years ago per husband   End stage renal disease (HCC)    Headache    Hypertension    Iron deficiency    Secondary hyperparathyroidism (HCC)    Traumatic brain injury [facility])    Past Surgical History:  Procedure Laterality Date   ARTERIOVENOUS GRAFT PLACEMENT     AV FISTULA PLACEMENT Left 04/13/2016   Procedure: UPPER EXTREMITY LEFT  ARTERIOVENOUS (AV) FISTULA CREATION;  Surgeon: Maeola Harman, MD;  Location: [facility] OR;  Service: Vascular;  Laterality: Left;   AVGG REMOVAL Right 04/10/2016   Procedure: REMOVAL OF Right Leg ARTERIOVENOUS GORETEX GRAFT (AVGG);  Surgeon: Maeola Harman, MD;  Location: [facility] OR;  Service: Vascular;  Laterality: Right;   INSERTION OF DIALYSIS CATHETER Right 04/10/2016   Procedure: insertion Dialysis catheter;  Surgeon: Maeola Harman, MD;  Location: [facility] OR;  Service: Vascular;  Laterality: Right;   INSERTION OF DIALYSIS CATHETER Right 04/13/2016   Procedure: INSERTION OF DIALYSIS CATHETER  RIGHT INTERNAL JUGULAR;  Surgeon: Maeola Harman, MD;  Location: [facility] OR;  Service: Vascular;  Laterality: Right;   IR FLUORO GUIDE CV LINE RIGHT  03/11/2021   IR PERC TUN PERIT CATH WO PORT S&I /IMAG  07/21/2021   IR US GUIDE VASC ACCESS RIGHT  03/11/2021    Kidney Transplant x 2 months ago     NO PAST SURGERIES     REVISION OF ARTERIOVENOUS GORETEX GRAFT Right 01/18/2016   Procedure: REVISION OF ARTERIOVENOUS GORETEX GRAFT Right leg;  Surgeon: Larina Earthly, MD;  Location: [facility] OR;  Service: Vascular;  Laterality: Right;   Social History:  reports that she has never smoked. She has never used smokeless tobacco. She reports that she does not drink alcohol and does not use drugs.  No Known Allergies  History reviewed. No pertinent family history.  Prior to Admission medications   Medication Sig Start Date End Date Taking? Authorizing Provider  acetaminophen (TYLENOL) 500 MG tablet Take 500 mg by mouth every 6 (six) hours as needed for mild pain or headache.    [provider]  benzonatate (TESSALON) 100 MG capsule Take 1 capsule (100 mg total) by mouth every 8 (eight) hours. 08/22/22   Smitty Knudsen, PA-C  cefdinir (OMNICEF) 300 MG capsule Take 1 capsule (300 mg total) by mouth 2 (two) times daily. 10/13/21   Standley Brooking, MD  magnesium oxide (MAG-OX) 400 MG tablet Take 800 mg by mouth daily.    [provider]  oseltamivir (TAMIFLU) 75 MG capsule Take 1 capsule (75 mg total) by mouth every 12 (twelve) hours. 08/22/22   Smitty Knudsen, PA-C  predniSONE (DELTASONE) 5 MG tablet Take 5 mg by mouth daily with breakfast.    [provider]  sodium bicarbonate 650 MG tablet Take 2,600 mg by mouth 3 (three) times daily. Four tablets three times a day 05/20/18   [provider]  tacrolimus (PROGRAF) 1 MG capsule Take 6-7 mg by mouth See admin instructions. Take 7 tablets in the AM and 6 tablets in the PM. 10/07/18   [provider]    Physical Exam: Vitals:   12/28/22 0442 12/28/22 0525 12/28/22 0730  BP: (!) 153/97  131/89  Pulse: 97  (!) 101  Resp: 18  17  Temp: (!) 101.2 F (38.4 C)  99.1 F (37.3 C)  TempSrc: Oral  Oral  SpO2: 100% 100% 98%  Weight: 61.2 kg    Height: 5' (1.524 m)     Physical  Exam Vitals and nursing note reviewed.  Constitutional:      Appearance: Normal appearance.  HENT:     Head: Normocephalic.     Mouth/Throat:     Mouth: Mucous membranes are moist.  Eyes:     General: No scleral icterus.    Pupils: Pupils are equal, round, and reactive to light.  Cardiovascular:     Rate and Rhythm: Regular rhythm. Tachycardia present.  Pulmonary:     Effort: Pulmonary effort is normal.     Breath sounds: Normal breath sounds.  Abdominal:     General: Bowel sounds are normal. There is no distension.     Palpations: Abdomen is soft.     Tenderness: There is abdominal tenderness. There is no guarding.  Musculoskeletal:     Cervical back: Neck supple.     Right lower leg: No edema.     Left lower leg: No edema.  Skin:    General: Skin is warm and  dry.  Neurological:     General: No focal deficit present.     Mental Status: She is alert and oriented to person, place, and time.  Psychiatric:        Mood and Affect: Mood normal.        Behavior: Behavior normal.     Data Reviewed:  There are no new results to review at this time.  Assessment and Plan: Principal Problem:   Acute UTI (urinary tract infection)   Pyelonephritis of transplanted kidney Inpatient/MedSurg. Continue IV fluids. Analgesics as needed. Antiemetics as needed. Continue daily physiological prednisone dose. Hold mycophenolate and tacrolimus for 24 to 48 hours. Continue meropenem 1 g IVPB every 8 hours. Follow-up urine culture and sensitivity. Follow-up blood culture and sensitivity. Follow CBC, CMP in AM tomorrow.  Active Problems:   Hyponatremia On and off hyponatremic. Continue IV fluids. Follow-up sodium level in a.m. Further workup if it worsens.    Transaminitis Recheck LFTs in AM.    Hyperglycemia No history of diabetes. Check fasting glucose in AM. Add on hemoglobin A1c if elevated.    HTN (hypertension) Currently not on antihypertensives. As needed non- ACE/ARB  vasodilator or beta-blocker. Continue blood pressure monitoring.    GERD (gastroesophageal reflux disease) Famotidine 20 mg p.o. nightly while in the hospital. Antiacid, H2 blocker or PPI as needed.     Advance Care Planning:   Code Status: Full Code   Consults:   Family Communication:   Severity of Illness: The appropriate patient status for this patient is INPATIENT. Inpatient status is judged to be reasonable and necessary in order to provide the required intensity of service to ensure the patient's safety. The patient's presenting symptoms, physical exam findings, and initial radiographic and laboratory data in the context of their chronic comorbidities is felt to place them at high risk for further clinical deterioration. Furthermore, it is not anticipated that the patient will be medically stable for discharge from the hospital within 2 midnights of admission.   * I certify that at the point of admission it is my clinical judgment that the patient will require inpatient hospital care spanning beyond 2 midnights from the point of admission due to high intensity of service, high risk for further deterioration and high frequency of surveillance required.*  Author: Bobette Mo, MD 12/28/2022 7:35 AM  For on call review [email or web address].   This document was prepared using Dragon voice recognition software and may contain some unintended transcription errors.

## 2022-12-28 NOTE — ED Triage Notes (Signed)
Pt arrives c/o painful urination, fever and body aches x 1 day. Is supposed to be on methenamine daily but missed a dose yesterday. Hx of frequent UTIs prior to taking methenamine daily. Hx of renal transplant in 2019. Took tylenol around 2100 last night.

## 2022-12-28 NOTE — Progress Notes (Signed)
PHARMACY - PHYSICIAN COMMUNICATION CRITICAL VALUE ALERT - BLOOD CULTURE IDENTIFICATION (BCID)  Katie Lyons is an 37 y.o. female who presented to [facility] on 12/28/2022 with a chief complaint of  Chief Complaint  Patient presents with   Fever   Dysuria     Assessment:  urosepsis    Name of physician (or Provider) Contacted: Anthoney Harada, NP   Current antibiotics: meropenem  Changes to prescribed antibiotics recommended:  Patient is on recommended antibiotics - No changes needed  Results for orders placed or performed during the hospital encounter of 12/28/22  Blood Culture ID Panel (Reflexed) (Collected: 12/28/2022  5:00 AM)  Result Value Ref Range   Enterococcus faecalis NOT DETECTED NOT DETECTED   Enterococcus Faecium NOT DETECTED NOT DETECTED   Listeria monocytogenes NOT DETECTED NOT DETECTED   Staphylococcus species NOT DETECTED NOT DETECTED   Staphylococcus aureus (BCID) NOT DETECTED NOT DETECTED   Staphylococcus epidermidis NOT DETECTED NOT DETECTED   Staphylococcus lugdunensis NOT DETECTED NOT DETECTED   Streptococcus species NOT DETECTED NOT DETECTED   Streptococcus agalactiae NOT DETECTED NOT DETECTED   Streptococcus pneumoniae NOT DETECTED NOT DETECTED   Streptococcus pyogenes NOT DETECTED NOT DETECTED   A.calcoaceticus-baumannii NOT DETECTED NOT DETECTED   Bacteroides fragilis NOT DETECTED NOT DETECTED   Enterobacterales DETECTED (A) NOT DETECTED   Enterobacter cloacae complex NOT DETECTED NOT DETECTED   Escherichia coli DETECTED (A) NOT DETECTED   Klebsiella aerogenes NOT DETECTED NOT DETECTED   Klebsiella oxytoca NOT DETECTED NOT DETECTED   Klebsiella pneumoniae NOT DETECTED NOT DETECTED   Proteus species NOT DETECTED NOT DETECTED   Salmonella species NOT DETECTED NOT DETECTED   Serratia marcescens NOT DETECTED NOT DETECTED   Haemophilus influenzae NOT DETECTED NOT DETECTED   Neisseria meningitidis NOT DETECTED NOT DETECTED   Pseudomonas  aeruginosa NOT DETECTED NOT DETECTED   Stenotrophomonas maltophilia NOT DETECTED NOT DETECTED   Candida albicans NOT DETECTED NOT DETECTED   Candida auris NOT DETECTED NOT DETECTED   Candida glabrata NOT DETECTED NOT DETECTED   Candida krusei NOT DETECTED NOT DETECTED   Candida parapsilosis NOT DETECTED NOT DETECTED   Candida tropicalis NOT DETECTED NOT DETECTED   Cryptococcus neoformans/gattii NOT DETECTED NOT DETECTED   CTX-M ESBL DETECTED (A) NOT DETECTED   Carbapenem resistance IMP NOT DETECTED NOT DETECTED   Carbapenem resistance KPC NOT DETECTED NOT DETECTED   Carbapenem resistance NDM NOT DETECTED NOT DETECTED   Carbapenem resist OXA 48 LIKE NOT DETECTED NOT DETECTED   Carbapenem resistance VIM NOT DETECTED NOT DETECTED     Adalberto Cole, PharmD, BCPS 12/28/2022 7:45 PM
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
## 2022-12-28 NOTE — ED Notes (Signed)
ED TO INPATIENT HANDOFF REPORT  ED Nurse Name and Phone #: Macie Burows, RN  S Name/Age/Gender Garen Grams 37 y.o. female Room/Bed: WA19/WA19  Code Status   Code Status: Prior  Home/SNF/Other Home Patient oriented to: self, place, time, and situation Is this baseline? Yes   Triage Complete: Triage complete  Chief Complaint Acute UTI (urinary tract infection) [N39.0]  Triage Note Pt arrives c/o painful urination, fever and body aches x 1 day. Is supposed to be on methenamine daily but missed a dose yesterday. Hx of frequent UTIs prior to taking methenamine daily. Hx of renal transplant in 2019. Took tylenol around 2100 last night.   Allergies No Known Allergies  Level of Care/Admitting Diagnosis ED Disposition     ED Disposition  Admit   Condition  --   Comment  [facility]: [facility] [[id number]]  Level of Care: Med-Surg [16]  May admit patient to [facility] or [facility] if equivalent level of care is available:: No  Covid Evaluation: Asymptomatic - no recent exposure (last 10 days) testing not required  Diagnosis: Acute UTI (urinary tract infection) [161096]  Admitting Physician: Bobette Mo [[id number]]  Attending Physician: Bobette Mo [[id number]]  Certification:: I certify this patient will need inpatient services for at least 2 midnights  Estimated Length of Stay: 2          B Medical/Surgery History Past Medical History:  Diagnosis Date   Anxiety    Chronic ischemic heart disease    Depression    3-4 years ago per husband   End stage renal disease (HCC)    Headache    Hypertension    Iron deficiency    Secondary hyperparathyroidism (HCC)    Traumatic brain injury [facility])    Past Surgical History:  Procedure Laterality Date   ARTERIOVENOUS GRAFT PLACEMENT     AV FISTULA PLACEMENT Left 04/13/2016   Procedure: UPPER EXTREMITY LEFT  ARTERIOVENOUS (AV) FISTULA CREATION;  Surgeon: Maeola Harman, MD;  Location: [facility] OR;  Service: Vascular;  Laterality: Left;   AVGG REMOVAL Right 04/10/2016   Procedure: REMOVAL OF Right Leg ARTERIOVENOUS GORETEX GRAFT (AVGG);  Surgeon: Maeola Harman, MD;  Location: [facility] OR;  Service: Vascular;  Laterality: Right;   INSERTION OF DIALYSIS CATHETER Right 04/10/2016   Procedure: insertion Dialysis catheter;  Surgeon: Maeola Harman, MD;  Location: [facility] OR;  Service: Vascular;  Laterality: Right;   INSERTION OF DIALYSIS CATHETER Right 04/13/2016   Procedure: INSERTION OF DIALYSIS CATHETER RIGHT INTERNAL JUGULAR;  Surgeon: Maeola Harman, MD;  Location: [facility] OR;  Service: Vascular;  Laterality: Right;   IR FLUORO GUIDE CV LINE RIGHT  03/11/2021   IR PERC TUN PERIT CATH WO PORT S&I /IMAG  07/21/2021   IR US GUIDE VASC ACCESS RIGHT  03/11/2021   Kidney Transplant x 2 months ago     NO PAST SURGERIES     REVISION OF ARTERIOVENOUS GORETEX GRAFT Right 01/18/2016   Procedure: REVISION OF ARTERIOVENOUS GORETEX GRAFT Right leg;  Surgeon: Larina Earthly, MD;  Location: [facility] OR;  Service: Vascular;  Laterality: Right;     A IV Location/Drains/Wounds Patient Lines/Drains/Airways Status     Active Line/Drains/Airways     Name Placement date Placement time Site Days   Peripheral IV 12/28/22 22 G 1" Posterior;Right Hand 12/28/22  0524  Hand  less than 1   Peripheral IV 12/28/22 20 G 1" Anterior;Proximal;Right Forearm 12/28/22  0539  Forearm  less than  1   Fistula / Graft Left Upper arm Arteriovenous fistula 07/17/21  1846  Upper arm  529            Intake/Output Last 24 hours No intake or output data in the 24 hours ending 12/28/22 1478  Labs/Imaging Results for orders placed or performed during the hospital encounter of 12/28/22 (from the past 48 hour(s))  Pregnancy, urine     Status: None   Collection Time: 12/28/22  4:38 AM  Result Value Ref Range   Preg Test, Ur NEGATIVE NEGATIVE    Comment:        THE SENSITIVITY OF  THIS METHODOLOGY IS >20 mIU/mL. Performed at [facility], [address] [place], [place] [address]   Urinalysis, Routine w reflex microscopic -Urine, Clean Catch     Status: Abnormal   Collection Time: 12/28/22  4:39 AM  Result Value Ref Range   Color, Urine YELLOW YELLOW   APPearance CLOUDY (A) CLEAR   Specific Gravity, Urine 1.019 1.005 - 1.030   pH 7.0 5.0 - 8.0   Glucose, UA NEGATIVE NEGATIVE mg/dL   Hgb urine dipstick LARGE (A) NEGATIVE   Bilirubin Urine NEGATIVE NEGATIVE   Ketones, ur NEGATIVE NEGATIVE mg/dL   Protein, ur 30 (A) NEGATIVE mg/dL   Nitrite POSITIVE (A) NEGATIVE   Leukocytes,Ua LARGE (A) NEGATIVE   RBC / HPF 21-50 0 - 5 RBC/hpf   WBC, UA >50 0 - 5 WBC/hpf   Bacteria, UA MANY (A) NONE SEEN   Squamous Epithelial / HPF 0-5 0 - 5 /HPF   WBC Clumps PRESENT    Mucus PRESENT    Budding Yeast PRESENT    Non Squamous Epithelial 0-5 (A) NONE SEEN   Crystals PRESENT (A) NEGATIVE    Comment: Performed at [facility], [address] [place], [place] [address]  Lactic acid, plasma     Status: None   Collection Time: 12/28/22  5:20 AM  Result Value Ref Range   Lactic Acid, Venous 1.7 0.5 - 1.9 mmol/L    Comment: Performed at [facility], [address], [place], [place] [address]  CBC with Differential     Status: Abnormal   Collection Time: 12/28/22  5:20 AM  Result Value Ref Range   WBC 14.2 (H) 4.0 - 10.5 K/uL   RBC 4.85 3.87 - 5.11 MIL/uL   Hemoglobin 13.3 12.0 - 15.0 g/dL   HCT 96.2 95.2 - 84.1 %   MCV 84.1 80.0 - 100.0 fL   MCH 27.4 26.0 - 34.0 pg   MCHC 32.6 30.0 - 36.0 g/dL   RDW 32.4 40.1 - 02.7 %   Platelets 186 150 - 400 K/uL   nRBC 0.0 0.0 - 0.2 %   Neutrophils Relative % 91 %   Neutro Abs 12.9 (H) 1.7 - 7.7 K/uL   Lymphocytes Relative 5 %   Lymphs Abs 0.8 0.7 - 4.0 K/uL   Monocytes Relative 3 %   Monocytes Absolute 0.4 0.1 - 1.0 K/uL   Eosinophils Relative 0 %   Eosinophils Absolute  0.1 0.0 - 0.5 K/uL   Basophils Relative 0 %   Basophils Absolute 0.0 0.0 - 0.1 K/uL   Immature Granulocytes 1 %   Abs Immature Granulocytes 0.07 0.00 - 0.07 K/uL    Comment: Performed at [facility], [address] [place], [place] [address]  Comprehensive metabolic panel     Status: Abnormal   Collection Time: 12/28/22  5:20 AM  Result Value Ref Range   Sodium 128 (L) 135 - 145 mmol/L   Potassium 3.5 3.5 - 5.1 mmol/L   Chloride 103 98 - 111 mmol/L   CO2 19 (L) 22 - 32 mmol/L   Glucose, Bld 160 (H) 70 - 99 mg/dL    Comment: Glucose reference range applies only to samples taken after fasting for at least 8 hours.   BUN 20 6 - 20 mg/dL   Creatinine, Ser 1.61 0.44 - 1.00 mg/dL   Calcium 8.8 (L) 8.9 - 10.3 mg/dL   Total Protein 7.3 6.5 - 8.1 g/dL   Albumin 3.7 3.5 - 5.0 g/dL   AST 70 (H) 15 - 41 U/L   ALT 48 (H) 0 - 44 U/L   Alkaline Phosphatase 107 38 - 126 U/L   Total Bilirubin 0.9 0.3 - 1.2 mg/dL   GFR, Estimated >09 >60 mL/min    Comment: (NOTE) Calculated using the CKD-EPI Creatinine Equation (2021)    Anion gap 6 5 - 15    Comment: Performed at [facility], [address] [place], [place] [address]  Lipase, blood     Status: None   Collection Time: 12/28/22  5:20 AM  Result Value Ref Range   Lipase 26 11 - 51 U/L    Comment: Performed at [facility], [address] [place], [place] [address]   CT Renal Stone Study  Result Date: 12/28/2022 CLINICAL DATA:  37 year old female with Fever and painful urination. Negative urine pregnancy test. History of native renal atrophy, right lower quadrant transplant. EXAM: CT ABDOMEN AND PELVIS WITHOUT CONTRAST TECHNIQUE: Multidetector CT imaging of the abdomen and pelvis was performed following the standard protocol without IV contrast. RADIATION DOSE REDUCTION: This exam was performed according to the departmental dose-optimization program which includes automated exposure  control, adjustment of the mA and/or kV according to patient size and/or use of iterative reconstruction technique. COMPARISON:  CT Abdomen and Pelvis 10/11/2021 FINDINGS: Lower chest: Borderline to mild cardiomegaly. No pericardial effusion. Symmetric lung base atelectasis. No pleural effusion. Small calcified granuloma right costophrenic angle. Hepatobiliary: Negative noncontrast liver and gallbladder. Pancreas: Negative. Spleen: Negative. Adrenals/Urinary Tract: Normal adrenal glands. Chronic native renal atrophy. Chronic right lower quadrant transplant. Moderate pararenal inflammatory stranding, especially dependent between the transplant and cecum (coronal image 61). See also series 2, image 55. No convincing hydronephrosis. No transplant nephrolithiasis. No transplant hydroureter. Chronic pelvic phleboliths, occasional surgical clips. Unremarkable noncontrast urinary bladder. Stomach/Bowel: No dilated large or small bowel. Right lower quadrant inflammation appears more related to the renal transplant than the cecum or distal small bowel. Normal appendix terminates on coronal image 89. Decompressed terminal ileum. Negative noncontrast stomach and duodenum. No free air or free fluid. No convincing bowel inflammation. Vascular/Lymphatic: Normal caliber abdominal aorta. Aortoiliac calcified atherosclerosis. Vascular patency is not evaluated in the absence of IV contrast. No lymphadenopathy identified. Reproductive: Negative noncontrast appearance. Other: No definite pelvis free fluid. Musculoskeletal: Chronic L5 pars fractures. Subtle L5-S1 spondylolisthesis is stable. Probable mild generalized renal osteodystrophy. No acute osseous abnormality identified. IMPRESSION: 1. Right lower quadrant renal transplant appears inflamed but is not obviously obstructed. Consider Transplant Pyelonephritis, ascending urinary infection, versus other acute intrinsic renal disease. 2. No other acute or inflammatory process  identified in the noncontrast abdomen or pelvis. Normal appendix.  Chronic L5 pars fractures. Aortic Atherosclerosis (ICD10-I70.0). Electronically Signed   By: Odessa Fleming M.D.   On: 12/28/2022 07:41   DG Chest 2 View  Result Date: 12/28/2022 CLINICAL DATA:  37 year old female with history of sepsis. Renal transplant. Fever and dysuria. EXAM: CHEST - 2 VIEW COMPARISON:  Chest x-ray 08/22/2022. FINDINGS: Lung volumes are normal. No consolidative airspace disease. No pleural effusions. No pneumothorax. No pulmonary nodule or mass noted. Pulmonary vasculature and the cardiomediastinal silhouette are within normal limits. IMPRESSION: No radiographic evidence of acute cardiopulmonary disease. Electronically Signed   By: Trudie Reed M.D.   On: 12/28/2022 06:50    Pending Labs Unresulted Labs (From admission, onward)     Start     Ordered   12/28/22 0520  Blood culture (routine x 2)  BLOOD CULTURE X 2,   R      12/28/22 0519   12/28/22 0520  Lactic acid, plasma  Now then every 2 hours,   R      12/28/22 0519   12/28/22 0520  Urine Culture (for pregnant, neutropenic or urologic patients or patients with an indwelling urinary catheter)  (Urine Labs)  Once,   URGENT       Question:  Indication  Answer:  Dysuria   12/28/22 0519            Vitals/Pain Today's Vitals   12/28/22 0442 12/28/22 0450 12/28/22 0525 12/28/22 0730  BP: (!) 153/97   131/89  Pulse: 97   (!) 101  Resp: 18   17  Temp: (!) 101.2 F (38.4 C)   99.1 F (37.3 C)  TempSrc: Oral   Oral  SpO2: 100%  100% 98%  Weight: 61.2 kg     Height: 5' (1.524 m)     PainSc:  9       Isolation Precautions No active isolations  Medications Medications  meropenem (MERREM) 1 g in sodium chloride 0.9 % 100 mL IVPB (has no administration in time range)  fentaNYL (SUBLIMAZE) injection 50 mcg (50 mcg Intravenous Given 12/28/22 0812)  lactated ringers bolus 1,000 mL (0 mLs Intravenous Stopped 12/28/22 0705)  meropenem (MERREM) 1 g in  sodium chloride 0.9 % 100 mL IVPB (0 g Intravenous Stopped 12/28/22 0705)  fentaNYL (SUBLIMAZE) injection 50 mcg (50 mcg Intravenous Given 12/28/22 0635)  ondansetron (ZOFRAN) injection 4 mg (4 mg Intravenous Given 12/28/22 1610)    Mobility walks     Focused Assessments NA   R Recommendations: See Admitting Provider Note  Report given to:   Additional Notes: NA

## 2022-12-28 NOTE — Progress Notes (Signed)
   12/28/22 2001  Provider Notification  Provider Name/Title Marja Kays  Date Provider Notified 12/28/22  Time Provider Notified 2002  Method of Notification Page (secure chat)  Notification Reason Red med refusal  Provider response No new orders  Date of Provider Response 12/28/22  Time of Provider Response 2002

## 2022-12-28 NOTE — ED Provider Notes (Signed)
[place] [facility]MENT AT [facility] Provider Note   CSN: [id number] Arrival date & time: 12/28/22  4098     History  Chief Complaint  Patient presents with   Fever   Dysuria    Katie Lyons is a 37 y.o. female.  Patient with a history of renal transplant in 2019, no longer on dialysis presenting with concern for UTI.  Family at bedside reports painful urination with body aches and chills over the past 24 hours consistent with previous UTI symptoms.  Has pain with urination, hematuria, nausea and fever at home.  No vomiting.  Did not check temperature at home.  Took Tylenol at 2100 last night.  Febrile to 101.2 on arrival.  Complains of pain all over including her lower abdomen and flanks.  No history of kidney stones.  No chest pain or shortness of breath.  No cough. Denies any travel or sick contacts.  The history is provided by the patient and a relative. The history is limited by a language barrier. A language interpreter was used.  Fever Associated symptoms: dysuria, myalgias, nausea and vomiting   Associated symptoms: no chest pain, no congestion, no cough, no headaches and no rhinorrhea   Dysuria Associated symptoms: abdominal pain, fever, nausea and vomiting        Home Medications Prior to Admission medications   Medication Sig Start Date End Date Taking? Authorizing Provider  acetaminophen (TYLENOL) 500 MG tablet Take 500 mg by mouth every 6 (six) hours as needed for mild pain or headache.    [provider]  benzonatate (TESSALON) 100 MG capsule Take 1 capsule (100 mg total) by mouth every 8 (eight) hours. 08/22/22   Smitty Knudsen, PA-C  cefdinir (OMNICEF) 300 MG capsule Take 1 capsule (300 mg total) by mouth 2 (two) times daily. 10/13/21   Standley Brooking, MD  magnesium oxide (MAG-OX) 400 MG tablet Take 800 mg by mouth daily.    [provider]  oseltamivir (TAMIFLU) 75 MG capsule Take 1 capsule (75 mg total) by  mouth every 12 (twelve) hours. 08/22/22   Smitty Knudsen, PA-C  predniSONE (DELTASONE) 5 MG tablet Take 5 mg by mouth daily with breakfast.    [provider]  sodium bicarbonate 650 MG tablet Take 2,600 mg by mouth 3 (three) times daily. Four tablets three times a day 05/20/18   [provider]  tacrolimus (PROGRAF) 1 MG capsule Take 6-7 mg by mouth See admin instructions. Take 7 tablets in the AM and 6 tablets in the PM. 10/07/18   [provider]      Allergies    Patient has no known allergies.    Review of Systems   Review of Systems  Constitutional:  Positive for activity change, appetite change and fever.  HENT:  Negative for congestion and rhinorrhea.   Eyes:  Negative for visual disturbance.  Respiratory:  Negative for cough, chest tightness and shortness of breath.   Cardiovascular:  Negative for chest pain.  Gastrointestinal:  Positive for abdominal pain, nausea and vomiting.  Genitourinary:  Positive for dysuria.  Musculoskeletal:  Positive for arthralgias, back pain and myalgias.  Skin:  Negative for pallor.  Neurological:  Negative for dizziness, weakness and headaches.   all other systems are negative except as noted in the HPI and PMH.    Physical Exam Updated Vital Signs BP (!) 153/97 (BP Location: Right Arm)   Pulse 97   Temp (!) 101.2 F (38.4 C) (  Oral)   Resp 18   Ht 5' (1.524 m)   Wt 61.2 kg   LMP  (LMP Unknown)   SpO2 100%   BMI 26.37 kg/m  Physical Exam Vitals and nursing note reviewed.  Constitutional:      General: She is not in acute distress.    Appearance: She is well-developed.     Comments: uncomfortable  HENT:     Head: Normocephalic and atraumatic.     Mouth/Throat:     Pharynx: No oropharyngeal exudate.  Eyes:     Conjunctiva/sclera: Conjunctivae normal.     Pupils: Pupils are equal, round, and reactive to light.  Neck:     Comments: No meningismus. Cardiovascular:     Rate and Rhythm: Normal rate and  regular rhythm.     Heart sounds: Normal heart sounds. No murmur heard. Pulmonary:     Effort: Pulmonary effort is normal. No respiratory distress.     Breath sounds: Normal breath sounds.  Abdominal:     Palpations: Abdomen is soft.     Tenderness: There is abdominal tenderness. There is no guarding or rebound.     Comments: Diffuse tenderness worse on the right side, voluntary guarding  Musculoskeletal:        General: No tenderness. Normal range of motion.     Cervical back: Normal range of motion and neck supple.     Comments: No CVA tenderness Left upper extremity AV fistula  Skin:    General: Skin is warm.     Capillary Refill: Capillary refill takes less than 2 seconds.  Neurological:     General: No focal deficit present.     Mental Status: She is alert and oriented to person, place, and time. Mental status is at baseline.     Cranial Nerves: No cranial nerve deficit.     Motor: No abnormal muscle tone.     Coordination: Coordination normal.     Comments: No ataxia on finger to nose bilaterally. No pronator drift. 5/5 strength throughout. CN 2-12 intact.Equal grip strength. Sensation intact.   Psychiatric:        Behavior: Behavior normal.     ED Results / Procedures / Treatments   Labs (all labs ordered are listed, but only abnormal results are displayed) Labs Reviewed  URINALYSIS, ROUTINE W REFLEX MICROSCOPIC - Abnormal; Notable for the following components:      Result Value   APPearance CLOUDY (*)    Hgb urine dipstick LARGE (*)    Protein, ur 30 (*)    Nitrite POSITIVE (*)    Leukocytes,Ua LARGE (*)    Bacteria, UA MANY (*)    Non Squamous Epithelial 0-5 (*)    Crystals PRESENT (*)    All other components within normal limits  CBC WITH DIFFERENTIAL/PLATELET - Abnormal; Notable for the following components:   WBC 14.2 (*)    Neutro Abs 12.9 (*)    All other components within normal limits  COMPREHENSIVE METABOLIC PANEL - Abnormal; Notable for the following  components:   Sodium 128 (*)    CO2 19 (*)    Glucose, Bld 160 (*)    Calcium 8.8 (*)    AST 70 (*)    ALT 48 (*)    All other components within normal limits  CULTURE, BLOOD (ROUTINE X 2)  CULTURE, BLOOD (ROUTINE X 2)  URINE CULTURE  LACTIC ACID, PLASMA  LIPASE, BLOOD  PREGNANCY, URINE  LACTIC ACID, PLASMA    EKG None  Radiology  CT Renal Stone Study  Result Date: 12/28/2022 CLINICAL DATA:  37 year old female with Fever and painful urination. Negative urine pregnancy test. History of native renal atrophy, right lower quadrant transplant. EXAM: CT ABDOMEN AND PELVIS WITHOUT CONTRAST TECHNIQUE: Multidetector CT imaging of the abdomen and pelvis was performed following the standard protocol without IV contrast. RADIATION DOSE REDUCTION: This exam was performed according to the departmental dose-optimization program which includes automated exposure control, adjustment of the mA and/or kV according to patient size and/or use of iterative reconstruction technique. COMPARISON:  CT Abdomen and Pelvis 10/11/2021 FINDINGS: Lower chest: Borderline to mild cardiomegaly. No pericardial effusion. Symmetric lung base atelectasis. No pleural effusion. Small calcified granuloma right costophrenic angle. Hepatobiliary: Negative noncontrast liver and gallbladder. Pancreas: Negative. Spleen: Negative. Adrenals/Urinary Tract: Normal adrenal glands. Chronic native renal atrophy. Chronic right lower quadrant transplant. Moderate pararenal inflammatory stranding, especially dependent between the transplant and cecum (coronal image 61). See also series 2, image 55. No convincing hydronephrosis. No transplant nephrolithiasis. No transplant hydroureter. Chronic pelvic phleboliths, occasional surgical clips. Unremarkable noncontrast urinary bladder. Stomach/Bowel: No dilated large or small bowel. Right lower quadrant inflammation appears more related to the renal transplant than the cecum or distal small bowel. Normal  appendix terminates on coronal image 89. Decompressed terminal ileum. Negative noncontrast stomach and duodenum. No free air or free fluid. No convincing bowel inflammation. Vascular/Lymphatic: Normal caliber abdominal aorta. Aortoiliac calcified atherosclerosis. Vascular patency is not evaluated in the absence of IV contrast. No lymphadenopathy identified. Reproductive: Negative noncontrast appearance. Other: No definite pelvis free fluid. Musculoskeletal: Chronic L5 pars fractures. Subtle L5-S1 spondylolisthesis is stable. Probable mild generalized renal osteodystrophy. No acute osseous abnormality identified. IMPRESSION: 1. Right lower quadrant renal transplant appears inflamed but is not obviously obstructed. Consider Transplant Pyelonephritis, ascending urinary infection, versus other acute intrinsic renal disease. 2. No other acute or inflammatory process identified in the noncontrast abdomen or pelvis. Normal appendix.  Chronic L5 pars fractures. Aortic Atherosclerosis (ICD10-I70.0). Electronically Signed   By: Odessa Fleming M.D.   On: 12/28/2022 07:41   DG Chest 2 View  Result Date: 12/28/2022 CLINICAL DATA:  37 year old female with history of sepsis. Renal transplant. Fever and dysuria. EXAM: CHEST - 2 VIEW COMPARISON:  Chest x-ray 08/22/2022. FINDINGS: Lung volumes are normal. No consolidative airspace disease. No pleural effusions. No pneumothorax. No pulmonary nodule or mass noted. Pulmonary vasculature and the cardiomediastinal silhouette are within normal limits. IMPRESSION: No radiographic evidence of acute cardiopulmonary disease. Electronically Signed   By: Trudie Reed M.D.   On: 12/28/2022 06:50    Procedures Procedures    Medications Ordered in ED Medications  lactated ringers bolus 1,000 mL (has no administration in time range)    ED Course/ Medical Decision Making/ A&P                             Medical Decision Making Amount and/or Complexity of Data Reviewed Labs: ordered.  Decision-making details documented in ED Course. Radiology: ordered and independent interpretation performed. Decision-making details documented in ED Course. ECG/medicine tests: ordered and independent interpretation performed. Decision-making details documented in ED Course.  Risk Prescription drug management. Decision regarding hospitalization.   Immunosuppressed patient here with concern for UTI.  Febrile and tachycardic and uncomfortable on arrival.  She is did an IV fluids and IV antibiotics after cultures are obtained.  Does have a history of ESBL infection remotely.  Patient has leukocytosis and fever but no tachycardia.  Urinalysis  concerning for infection.  Will send culture.  Will treat empirically with meropenem given ESBL history.  CT is obtained to rule out obstructing kidney stone.  This shows evidence of inflammation and pyelonephritis but no obstructing stone.  Creatinine is at baseline.  Continue IV fluids and IV antibiotics while cultures are pending.  Admission discussed with Dr. Robb Matar.        Final Clinical Impression(s) / ED Diagnoses Final diagnoses:  Complicated UTI (urinary tract infection)    Rx / DC Orders ED Discharge Orders     None         Yazleemar Strassner, Jeannett Senior, MD 12/28/22 [phone]

## 2022-12-28 NOTE — Progress Notes (Signed)
Pharmacy Antibiotic Note  Kianni Schnorr is a 37 y.o. female admitted on 12/28/2022 with history of renal transplant in 2019, no longer on dialysis presenting with concern for UTI. Marland Kitchen  Pharmacy has been consulted for merrem dosing.  Plan: Merrem 1gm IV q8h Follow renal function, cultures and clinical course  Height: 5' (152.4 cm) Weight: 61.2 kg (135 lb) IBW/kg (Calculated) : 45.5  Temp (24hrs), Avg:101.2 F (38.4 C), Min:101.2 F (38.4 C), Max:101.2 F (38.4 C)  Recent Labs  Lab 12/28/22 0520  WBC 14.2*  CREATININE 0.93    Estimated Creatinine Clearance: 66.4 mL/min (by C-G formula based on SCr of 0.93 mg/dL).    No Known Allergies  Antimicrobials this admission:  5/16 merrem >>  Dose adjustments this admission:   Microbiology results: 5/16 BCx:  5/16 UCx:   Thank you for allowing pharmacy to be a part of this patient's care.  Arley Phenix RPh 12/28/2022, 6:00 AM

## 2022-12-29 DIAGNOSIS — I10 Essential (primary) hypertension: Secondary | ICD-10-CM

## 2022-12-29 LAB — CBC
HCT: 39.7 % (ref 36.0–46.0)
Hemoglobin: 12.7 g/dL (ref 12.0–15.0)
MCH: 27.5 pg (ref 26.0–34.0)
MCHC: 32 g/dL (ref 30.0–36.0)
MCV: 85.9 fL (ref 80.0–100.0)
Platelets: 144 10*3/uL — ABNORMAL LOW (ref 150–400)
RBC: 4.62 MIL/uL (ref 3.87–5.11)
RDW: 14.1 % (ref 11.5–15.5)
WBC: 14.2 10*3/uL — ABNORMAL HIGH (ref 4.0–10.5)
nRBC: 0 % (ref 0.0–0.2)

## 2022-12-29 LAB — COMPREHENSIVE METABOLIC PANEL
ALT: 52 U/L — ABNORMAL HIGH (ref 0–44)
AST: 35 U/L (ref 15–41)
Albumin: 3.1 g/dL — ABNORMAL LOW (ref 3.5–5.0)
Alkaline Phosphatase: 101 U/L (ref 38–126)
Anion gap: 7 (ref 5–15)
BUN: 11 mg/dL (ref 6–20)
CO2: 19 mmol/L — ABNORMAL LOW (ref 22–32)
Calcium: 8.6 mg/dL — ABNORMAL LOW (ref 8.9–10.3)
Chloride: 107 mmol/L (ref 98–111)
Creatinine, Ser: 0.94 mg/dL (ref 0.44–1.00)
GFR, Estimated: >60 mL/min (ref >60)
Glucose, Bld: 120 mg/dL — ABNORMAL HIGH (ref 70–99)
Potassium: 3.6 mmol/L (ref 3.5–5.1)
Sodium: 133 mmol/L — ABNORMAL LOW (ref 135–145)
Total Bilirubin: 0.8 mg/dL (ref 0.3–1.2)
Total Protein: 6.6 g/dL (ref 6.5–8.1)

## 2022-12-29 LAB — MAGNESIUM: Magnesium: 1.5 mg/dL — ABNORMAL LOW (ref 1.7–2.4)

## 2022-12-29 LAB — HIV ANTIBODY (ROUTINE TESTING W REFLEX): HIV Screen 4th Generation wRfx: NONREACTIVE

## 2022-12-29 MED ORDER — METHENAMINE MANDELATE 0.5 G PO TABS
1000.0000 mg | ORAL_TABLET | Freq: Every day | ORAL | Status: DC
  Administered 2022-12-29: 1000 mg via ORAL
  Filled 2022-12-29: qty 2

## 2022-12-29 MED ORDER — MAGNESIUM OXIDE -MG SUPPLEMENT 400 (240 MG) MG PO TABS
800.0000 mg | ORAL_TABLET | Freq: Every day | ORAL | Status: DC
  Administered 2022-12-30 – 2023-01-01 (×3): 800 mg via ORAL
  Filled 2022-12-29 (×3): qty 2

## 2022-12-29 MED ORDER — MAGNESIUM OXIDE 400 MG PO TABS: 800.0000 mg | ORAL_TABLET | Freq: Every day | ORAL | Status: DC

## 2022-12-29 MED ORDER — MAGNESIUM OXIDE -MG SUPPLEMENT 400 (240 MG) MG PO TABS: 800.0000 mg | ORAL_TABLET | Freq: Every day | ORAL | Status: DC

## 2022-12-29 MED ORDER — TACROLIMUS 1 MG PO CAPS
6.0000 mg | ORAL_CAPSULE | Freq: Every day | ORAL | Status: DC
  Administered 2022-12-29 – 2023-01-01 (×4): 6 mg via ORAL
  Filled 2022-12-29 (×4): qty 6

## 2022-12-29 MED ORDER — METHENAMINE HIPPURATE 1 G PO TABS: 1.0000 g | ORAL_TABLET | Freq: Every day | ORAL | Status: DC

## 2022-12-29 MED ORDER — MYCOPHENOLATE SODIUM 180 MG PO TBEC
180.0000 mg | DELAYED_RELEASE_TABLET | Freq: Two times a day (BID) | ORAL | Status: DC
  Administered 2022-12-30 – 2023-01-02 (×7): 180 mg via ORAL
  Filled 2022-12-29 (×8): qty 1

## 2022-12-29 MED ORDER — TACROLIMUS 1 MG PO CAPS
7.0000 mg | ORAL_CAPSULE | Freq: Every day | ORAL | Status: DC
  Administered 2022-12-29 – 2023-01-02 (×5): 7 mg via ORAL
  Filled 2022-12-29 (×5): qty 7

## 2022-12-29 MED ORDER — MAGNESIUM OXIDE -MG SUPPLEMENT 400 (240 MG) MG PO TABS
800.0000 mg | ORAL_TABLET | Freq: Two times a day (BID) | ORAL | Status: AC
  Administered 2022-12-29 (×2): 800 mg via ORAL
  Filled 2022-12-29 (×2): qty 2

## 2022-12-29 MED ORDER — TACROLIMUS 1 MG PO CAPS: 6.0000 mg | ORAL_CAPSULE | ORAL | Status: DC

## 2022-12-29 NOTE — Progress Notes (Signed)
[facility] PROGRESS NOTE    Progress Note  Katheine Levell  JYN:[id number] DOB: March 12, 1986 DOA: 12/28/2022 PCP: Eunice Blase, Kirstie Peri, MD     Brief Narrative:   Katie Lyons is an 37 y.o. female past medical history significant for anxiety, chronic ischemic cardiomyopathy, essential hypertension, traumatic intracerebral hemorrhage with brain injury, end-stage renal disease with a history of kidney transplant and bilobar kidney transplant comes into the ED complaining of fever body aches and fatigue along with dysuria was found to have acute kidney injury possibly pyelonephritis, CT scan of the abdomen pelvis showed inflamed right renal transplant.    Assessment/Plan:   Sepsis due to acute UTI (urinary tract infection)/pyelonephritis transplant kidney: Continue daily physiologic prednisone, immunosuppressant therapy has been held for 40 hours. Currently on IV meropenem she has defervesced in, her Tmax is 100.9. Urine cultures pending and blood cultures were positive for Enterobacter.  Hypovolemic hyponatremia: She was allowed oral hydration and her sodium is improving.  Elevated LFTs: Improved, likely due to acute pyelonephritis  Hyperglycemia: A1c is pending question reactive slowly improving. It is to note she is on prednisone.  GERD: Continue famotidine.  Mild thrombocytopenia: Negative infectious etiology check a CBC tomorrow morning   DVT prophylaxis: lovenox Family Communication:none Status is: Inpatient Remains inpatient appropriate because: This is due to acute pyelonephritis    Code Status:     Code Status Orders  (From admission, onward)           Start     Ordered   12/28/22 0821  Full code  Continuous       Question:  By:  Answer:  Consent: discussion documented in EHR   12/28/22 0820           Code Status History     Date Active Date Inactive Code Status Order ID Comments User Context   10/11/2021  0520 10/13/2021 1920 Full Code [id number]  Briscoe Deutscher, MD ED   07/16/2021 2314 07/21/2021 2118 Full Code [id number]  Anselm Jungling, DO ED   03/09/2021 1807 03/14/2021 2116 Full Code [id number]  Merlene Laughter, DO Inpatient   01/22/2021 1319 01/24/2021 1752 Full Code [id number]  Steffanie Rainwater, MD ED   09/01/2019 0119 09/02/2019 2050 Full Code [id number]  Eduard Clos, MD ED   10/31/2018 0426 11/03/2018 0003 Full Code [id number]  Lorretta Harp, MD ED   04/10/2016 1902 04/15/2016 2201 Full Code [id number]  Lars Mage, PA-C Inpatient   04/10/2016 1852 04/10/2016 1901 Full Code [id number]  Beather Arbour, MD Inpatient         IV Access:   Peripheral IV   Procedures and diagnostic studies:   CT Renal Stone Study  Result Date: 12/28/2022 CLINICAL DATA:  37 year old female with Fever and painful urination. Negative urine pregnancy test. History of native renal atrophy, right lower quadrant transplant. EXAM: CT ABDOMEN AND PELVIS WITHOUT CONTRAST TECHNIQUE: Multidetector CT imaging of the abdomen and pelvis was performed following the standard protocol without IV contrast. RADIATION DOSE REDUCTION: This exam was performed according to the departmental dose-optimization program which includes automated exposure control, adjustment of the mA and/or kV according to patient size and/or use of iterative reconstruction technique. COMPARISON:  CT Abdomen and Pelvis 10/11/2021 FINDINGS: Lower chest: Borderline to mild cardiomegaly. No pericardial effusion. Symmetric lung base atelectasis. No pleural effusion. Small calcified granuloma right costophrenic angle. Hepatobiliary: Negative noncontrast liver and gallbladder. Pancreas: Negative. Spleen: Negative. Adrenals/Urinary Tract: Normal adrenal glands. Chronic native renal atrophy. Chronic right  lower quadrant transplant. Moderate pararenal inflammatory stranding, especially dependent between the transplant and cecum (coronal image 61). See also series 2, image  55. No convincing hydronephrosis. No transplant nephrolithiasis. No transplant hydroureter. Chronic pelvic phleboliths, occasional surgical clips. Unremarkable noncontrast urinary bladder. Stomach/Bowel: No dilated large or small bowel. Right lower quadrant inflammation appears more related to the renal transplant than the cecum or distal small bowel. Normal appendix terminates on coronal image 89. Decompressed terminal ileum. Negative noncontrast stomach and duodenum. No free air or free fluid. No convincing bowel inflammation. Vascular/Lymphatic: Normal caliber abdominal aorta. Aortoiliac calcified atherosclerosis. Vascular patency is not evaluated in the absence of IV contrast. No lymphadenopathy identified. Reproductive: Negative noncontrast appearance. Other: No definite pelvis free fluid. Musculoskeletal: Chronic L5 pars fractures. Subtle L5-S1 spondylolisthesis is stable. Probable mild generalized renal osteodystrophy. No acute osseous abnormality identified. IMPRESSION: 1. Right lower quadrant renal transplant appears inflamed but is not obviously obstructed. Consider Transplant Pyelonephritis, ascending urinary infection, versus other acute intrinsic renal disease. 2. No other acute or inflammatory process identified in the noncontrast abdomen or pelvis. Normal appendix.  Chronic L5 pars fractures. Aortic Atherosclerosis (ICD10-I70.0). Electronically Signed   By: Odessa Fleming M.D.   On: 12/28/2022 07:41   DG Chest 2 View  Result Date: 12/28/2022 CLINICAL DATA:  37 year old female with history of sepsis. Renal transplant. Fever and dysuria. EXAM: CHEST - 2 VIEW COMPARISON:  Chest x-ray 08/22/2022. FINDINGS: Lung volumes are normal. No consolidative airspace disease. No pleural effusions. No pneumothorax. No pulmonary nodule or mass noted. Pulmonary vasculature and the cardiomediastinal silhouette are within normal limits. IMPRESSION: No radiographic evidence of acute cardiopulmonary disease. Electronically  Signed   By: Trudie Reed M.D.   On: 12/28/2022 06:50     Medical Consultants:   None.   Subjective:    Garen Grams she relates she feels better body aches are gone tolerating her diet.  Objective:    Vitals:   12/28/22 1801 12/28/22 2028 12/28/22 2330 12/29/22 0523  BP: 120/79 126/79  110/66  Pulse: 74 81  74  Resp: 18 18  18   Temp: 98 F (36.7 C) 98.7 F (37.1 C) (!) 100.9 F (38.3 C) 98.6 F (37 C)  TempSrc: Oral  Oral   SpO2: 100% 99%  99%  Weight:      Height:       SpO2: 99 %   Intake/Output Summary (Last 24 hours) at 12/29/2022 0714 Last data filed at 12/29/2022 0600 Gross per 24 hour  Intake 2940.62 ml  Output 700 ml  Net 2240.62 ml   Filed Weights   12/28/22 0442  Weight: 61.2 kg    Exam: General exam: In no acute distress. Respiratory system: Good air movement and clear to auscultation. Cardiovascular system: S1 & S2 heard, RRR. No JVD. Gastrointestinal system: Abdomen is nondistended, soft and nontender.  Extremities: No pedal edema. Skin: No rashes, lesions or ulcers Psychiatry: Judgement and insight appear normal. Mood & affect appropriate.    Data Reviewed:    Labs: Basic Metabolic Panel: Recent Labs  Lab 12/28/22 0520 12/29/22 0441  NA 128* 133*  K 3.5 3.6  CL 103 107  CO2 19* 19*  GLUCOSE 160* 120*  BUN 20 11  CREATININE 0.93 0.94  CALCIUM 8.8* 8.6*   GFR Estimated Creatinine Clearance: 65.7 mL/min (by C-G formula based on SCr of 0.94 mg/dL). Liver Function Tests: Recent Labs  Lab 12/28/22 0520 12/29/22 0441  AST 70* 35  ALT 48* 52*  ALKPHOS 107 101  BILITOT 0.9 0.8  PROT 7.3 6.6  ALBUMIN 3.7 3.1*   Recent Labs  Lab 12/28/22 0520  LIPASE 26   No results for input(s): "AMMONIA" in the last 168 hours. Coagulation profile No results for input(s): "INR", "PROTIME" in the last 168 hours. COVID-19 Labs  No results for input(s): "DDIMER", "FERRITIN", "LDH", "CRP" in the last 72 hours.  Lab  Results  Component Value Date   SARSCOV2NAA NEGATIVE 08/22/2022   SARSCOV2NAA NEGATIVE 10/10/2021   SARSCOV2NAA NEGATIVE 07/16/2021   SARSCOV2NAA NEGATIVE 03/09/2021    CBC: Recent Labs  Lab 12/28/22 0520 12/29/22 0441  WBC 14.2* 14.2*  NEUTROABS 12.9*  --   HGB 13.3 12.7  HCT 40.8 39.7  MCV 84.1 85.9  PLT 186 144*   Cardiac Enzymes: No results for input(s): "CKTOTAL", "CKMB", "CKMBINDEX", "TROPONINI" in the last 168 hours. BNP (last 3 results) No results for input(s): "PROBNP" in the last 8760 hours. CBG: No results for input(s): "GLUCAP" in the last 168 hours. D-Dimer: No results for input(s): "DDIMER" in the last 72 hours. Hgb A1c: No results for input(s): "HGBA1C" in the last 72 hours. Lipid Profile: No results for input(s): "CHOL", "HDL", "LDLCALC", "TRIG", "CHOLHDL", "LDLDIRECT" in the last 72 hours. Thyroid function studies: No results for input(s): "TSH", "T4TOTAL", "T3FREE", "THYROIDAB" in the last 72 hours.  Invalid input(s): "FREET3" Anemia work up: No results for input(s): "VITAMINB12", "FOLATE", "FERRITIN", "TIBC", "IRON", "RETICCTPCT" in the last 72 hours. Sepsis Labs: Recent Labs  Lab 12/28/22 0520 12/28/22 0922 12/29/22 0441  WBC 14.2*  --  14.2*  LATICACIDVEN 1.7 0.7  --    Microbiology Recent Results (from the past 240 hour(s))  Blood culture (routine x 2)     Status: None (Preliminary result)   Collection Time: 12/28/22  5:00 AM   Specimen: BLOOD RIGHT HAND  Result Value Ref Range Status   Specimen Description   Final    BLOOD RIGHT HAND Performed at [facility], [address] [place], [place] [address]    Special Requests   Final    BOTTLES DRAWN AEROBIC AND ANAEROBIC Blood Culture adequate volume Performed at [facility], [address] [place], [place] [address]    Culture  Setup Time   Final    GRAM NEGATIVE RODS ANAEROBIC BOTTLE ONLY CRITICAL RESULT CALLED TO, READ BACK BY AND VERIFIED  WITH: Abe People [id number] @ [phone] FH Performed at [facility], [address], [place], [place] [address]    Culture GRAM NEGATIVE RODS  Final   Report Status PENDING  Incomplete  Blood Culture ID Panel (Reflexed)     Status: Abnormal   Collection Time: 12/28/22  5:00 AM  Result Value Ref Range Status   Enterococcus faecalis NOT DETECTED NOT DETECTED Final   Enterococcus Faecium NOT DETECTED NOT DETECTED Final   Listeria monocytogenes NOT DETECTED NOT DETECTED Final   Staphylococcus species NOT DETECTED NOT DETECTED Final   Staphylococcus aureus (BCID) NOT DETECTED NOT DETECTED Final   Staphylococcus epidermidis NOT DETECTED NOT DETECTED Final   Staphylococcus lugdunensis NOT DETECTED NOT DETECTED Final   Streptococcus species NOT DETECTED NOT DETECTED Final   Streptococcus agalactiae NOT DETECTED NOT DETECTED Final   Streptococcus pneumoniae NOT DETECTED NOT DETECTED Final   Streptococcus pyogenes NOT DETECTED NOT DETECTED Final   A.calcoaceticus-baumannii NOT DETECTED NOT DETECTED Final   Bacteroides fragilis NOT DETECTED NOT DETECTED Final   Enterobacterales DETECTED (A) NOT DETECTED Final    Comment: Enterobacterales  represent a large order of gram negative bacteria, not a single organism. CRITICAL RESULT CALLED TO, READ BACK BY AND VERIFIED WITH: PHARMD N. GLOGOVAC [id number] @ 1915 FH    Enterobacter cloacae complex NOT DETECTED NOT DETECTED Final   Escherichia coli DETECTED (A) NOT DETECTED Final    Comment: CRITICAL RESULT CALLED TO, READ BACK BY AND VERIFIED WITH: PHARMD NCarin Hock [id number] @ 1915 FH    Klebsiella aerogenes NOT DETECTED NOT DETECTED Final   Klebsiella oxytoca NOT DETECTED NOT DETECTED Final   Klebsiella pneumoniae NOT DETECTED NOT DETECTED Final   Proteus species NOT DETECTED NOT DETECTED Final   Salmonella species NOT DETECTED NOT DETECTED Final   Serratia marcescens NOT DETECTED NOT DETECTED Final   Haemophilus influenzae NOT DETECTED NOT  DETECTED Final   Neisseria meningitidis NOT DETECTED NOT DETECTED Final   Pseudomonas aeruginosa NOT DETECTED NOT DETECTED Final   Stenotrophomonas maltophilia NOT DETECTED NOT DETECTED Final   Candida albicans NOT DETECTED NOT DETECTED Final   Candida auris NOT DETECTED NOT DETECTED Final   Candida glabrata NOT DETECTED NOT DETECTED Final   Candida krusei NOT DETECTED NOT DETECTED Final   Candida parapsilosis NOT DETECTED NOT DETECTED Final   Candida tropicalis NOT DETECTED NOT DETECTED Final   Cryptococcus neoformans/gattii NOT DETECTED NOT DETECTED Final   CTX-M ESBL DETECTED (A) NOT DETECTED Final    Comment: CRITICAL RESULT CALLED TO, READ BACK BY AND VERIFIED WITH: PHARMD NCarin Hock [id number] @ 1915 FH (NOTE) Extended spectrum beta-lactamase detected. Recommend a carbapenem as initial therapy.      Carbapenem resistance IMP NOT DETECTED NOT DETECTED Final   Carbapenem resistance KPC NOT DETECTED NOT DETECTED Final   Carbapenem resistance NDM NOT DETECTED NOT DETECTED Final   Carbapenem resist OXA 48 LIKE NOT DETECTED NOT DETECTED Final   Carbapenem resistance VIM NOT DETECTED NOT DETECTED Final    Comment: Performed at [facility], [address], [place], [place] [address]  Blood culture (routine x 2)     Status: None (Preliminary result)   Collection Time: 12/28/22  5:37 AM   Specimen: BLOOD  Result Value Ref Range Status   Specimen Description   Final    BLOOD RIGHT ANTECUBITAL Performed at [facility], [address], [place], [place] [address]    Special Requests   Final    BOTTLES DRAWN AEROBIC AND ANAEROBIC Blood Culture adequate volume Performed at [facility], [address], [place], [place] [address]    Culture  Setup Time   Final    GRAM NEGATIVE RODS AEROBIC BOTTLE ONLY CRITICAL VALUE NOTED.  VALUE IS CONSISTENT WITH PREVIOUSLY REPORTED AND CALLED VALUE. Performed at [facility], [address],  [place], [place] [address]    Culture GRAM NEGATIVE RODS  Final   Report Status PENDING  Incomplete     Medications:    enoxaparin (LOVENOX) injection  40 mg Subcutaneous Q24H   famotidine  20 mg Oral QHS   predniSONE  5 mg Oral Q breakfast   sodium bicarbonate  2,600 mg Oral TID   Continuous Infusions:  meropenem (MERREM) IV 1 g (12/29/22 0539)      LOS: 1 day   Marinda Elk  [facility]  12/29/2022, 7:14 AM

## 2022-12-29 NOTE — Progress Notes (Signed)
Pt. has Acute UTI, She spiked a temp of 101.1.  Tylenol given, She's on Meropenem (Merrem) 1g. abx. C/o general pain.   Oxycodone given.  Do you want to do any thing else.

## 2022-12-30 LAB — CBC WITH DIFFERENTIAL/PLATELET
Abs Immature Granulocytes: 0.05 10*3/uL (ref 0.00–0.07)
Basophils Absolute: 0 10*3/uL (ref 0.0–0.1)
Basophils Relative: 0 %
Eosinophils Absolute: 0 10*3/uL (ref 0.0–0.5)
Eosinophils Relative: 0 %
HCT: 38.7 % (ref 36.0–46.0)
Hemoglobin: 12.7 g/dL (ref 12.0–15.0)
Immature Granulocytes: 0 %
Lymphocytes Relative: 17 %
Lymphs Abs: 1.9 10*3/uL (ref 0.7–4.0)
MCH: 27.9 pg (ref 26.0–34.0)
MCHC: 32.8 g/dL (ref 30.0–36.0)
MCV: 85.1 fL (ref 80.0–100.0)
Monocytes Absolute: 1.2 10*3/uL — ABNORMAL HIGH (ref 0.1–1.0)
Monocytes Relative: 11 %
Neutro Abs: 8.1 10*3/uL — ABNORMAL HIGH (ref 1.7–7.7)
Neutrophils Relative %: 72 %
Platelets: 151 10*3/uL (ref 150–400)
RBC: 4.55 MIL/uL (ref 3.87–5.11)
RDW: 13.9 % (ref 11.5–15.5)
WBC: 11.3 10*3/uL — ABNORMAL HIGH (ref 4.0–10.5)
nRBC: 0 % (ref 0.0–0.2)

## 2022-12-30 LAB — URINE CULTURE: Culture: >=100000 — AB

## 2022-12-30 LAB — CULTURE, BLOOD (ROUTINE X 2)
Special Requests: ADEQUATE
Special Requests: ADEQUATE

## 2022-12-30 LAB — BASIC METABOLIC PANEL
Anion gap: 7 (ref 5–15)
BUN: 12 mg/dL (ref 6–20)
CO2: 22 mmol/L (ref 22–32)
Calcium: 8.7 mg/dL — ABNORMAL LOW (ref 8.9–10.3)
Chloride: 103 mmol/L (ref 98–111)
Creatinine, Ser: 0.86 mg/dL (ref 0.44–1.00)
GFR, Estimated: >60 mL/min (ref >60)
Glucose, Bld: 182 mg/dL — ABNORMAL HIGH (ref 70–99)
Potassium: 3.6 mmol/L (ref 3.5–5.1)
Sodium: 132 mmol/L — ABNORMAL LOW (ref 135–145)

## 2022-12-30 LAB — HEMOGLOBIN A1C
Hgb A1c MFr Bld: 5.8 % — ABNORMAL HIGH (ref 4.8–5.6)
Mean Plasma Glucose: 119.76 mg/dL

## 2022-12-30 LAB — GLUCOSE, CAPILLARY
Glucose-Capillary: 119 mg/dL — ABNORMAL HIGH (ref 70–99)
Glucose-Capillary: 235 mg/dL — ABNORMAL HIGH (ref 70–99)
Glucose-Capillary: 110 mg/dL — ABNORMAL HIGH (ref 70–99)
Glucose-Capillary: 192 mg/dL — ABNORMAL HIGH (ref 70–99)

## 2022-12-30 LAB — MAGNESIUM: Magnesium: 1.5 mg/dL — ABNORMAL LOW (ref 1.7–2.4)

## 2022-12-30 MED ORDER — INSULIN ASPART 100 UNIT/ML IJ SOLN
0.0000 [IU] | Freq: Three times a day (TID) | INTRAMUSCULAR | Status: DC
  Administered 2022-12-30: 2 [IU] via SUBCUTANEOUS
  Administered 2022-12-31 (×2): 1 [IU] via SUBCUTANEOUS
  Administered 2023-01-01: 2 [IU] via SUBCUTANEOUS

## 2022-12-30 MED ORDER — INSULIN ASPART 100 UNIT/ML IJ SOLN: 0.0000 [IU] | Freq: Every day | INTRAMUSCULAR | Status: DC

## 2022-12-30 MED ORDER — INSULIN ASPART 100 UNIT/ML IJ SOLN
2.0000 [IU] | Freq: Three times a day (TID) | INTRAMUSCULAR | Status: DC
  Administered 2022-12-30 – 2023-01-02 (×5): 2 [IU] via SUBCUTANEOUS

## 2022-12-30 MED ORDER — SODIUM CHLORIDE 0.9 % IV SOLN
2.0000 g | Freq: Three times a day (TID) | INTRAVENOUS | Status: DC
  Administered 2022-12-30 – 2022-12-31 (×3): 2 g via INTRAVENOUS
  Filled 2022-12-30 (×5): qty 40

## 2022-12-30 NOTE — Progress Notes (Signed)
Pharmacy Antibiotic Note  Katie Lyons is a 37 y.o. female admitted on 12/28/2022 with history of renal transplant in 2019, no longer on dialysis presenting with concern for UTI. Marland Kitchen  Pharmacy has been consulted for merrem dosing.  Day #3 of abx for ESBL E. Coli bacteremia secondary to UTI/pyelo. Did have fever in past 24 hrs.  Plan: Increase Merrem to 2gm IV q8h Follow renal function, cultures and clinical course  Height: 5' (152.4 cm) Weight: 61.2 kg (135 lb) IBW/kg (Calculated) : 45.5  Temp (24hrs), Avg:99.2 F (37.3 C), Min:98.1 F (36.7 C), Max:101.1 F (38.4 C)  Recent Labs  Lab 12/28/22 0520 12/28/22 0922 12/29/22 0441 12/30/22 0425  WBC 14.2*  --  14.2* 11.3*  CREATININE 0.93  --  0.94 0.86  LATICACIDVEN 1.7 0.7  --   --      Estimated Creatinine Clearance: 71.8 mL/min (by C-G formula based on SCr of 0.86 mg/dL).    No Known Allergies  Antimicrobials this admission:  5/16 merrem >>  Dose adjustments this admission:   Microbiology results: 5/16 BCx: ESBL e. coli 5/16 UCx: ESBL e. coli  Thank you for allowing pharmacy to be a part of this patient's care.  Enzo Bi, PharmD, BCPS, BCIDP Clinical Pharmacist 12/30/2022 8:54 AM

## 2022-12-30 NOTE — Progress Notes (Signed)
[facility] PROGRESS NOTE    Progress Note  Katie Lyons  QMV:[id number] DOB: Jun 29, 1986 DOA: 12/28/2022 PCP: Eunice Blase, Kirstie Peri, MD     Brief Narrative:   Katie Lyons is an 37 y.o. female past medical history significant for anxiety, chronic ischemic cardiomyopathy, essential hypertension, traumatic intracerebral hemorrhage with brain injury, end-stage renal disease with a history of kidney transplant and bilobar kidney transplant comes into the ED complaining of fever body aches and fatigue along with dysuria was found to have acute kidney injury possibly pyelonephritis, CT scan of the abdomen pelvis showed inflamed right renal transplant.  Assessment/Plan:   Sepsis due to acute UTI (urinary tract infection)/pyelonephritis transplant kidney: Immunosuppressant therapy has been held for 48 hours. Continue IV meropenem she has defervesced in, her Tmax is 101.1, leukocytosis improving. Urine cultures positive for ESBL E. coli sensitive to Meropenem and blood cultures were positive for gram-negative rods speciation and sensitivities are pending  Hypovolemic hyponatremia: She was allowed oral hydration and her sodium is improving.  Elevated LFTs: Improved, likely due to acute pyelonephritis  Hyperglycemia: A1c is pending, blood glucose continues to be elevated. It is to note she is on prednisone. Start sliding scale insulin.  GERD: Continue famotidine.  Mild thrombocytopenia: Negative infectious etiology check a CBC tomorrow morning   DVT prophylaxis: lovenox Family Communication:none Status is: Inpatient Remains inpatient appropriate because: This is due to acute pyelonephritis    Code Status:     Code Status Orders  (From admission, onward)           Start     Ordered   12/28/22 0821  Full code  Continuous       Question:  By:  Answer:  Consent: discussion documented in EHR   12/28/22 0820           Code Status  History     Date Active Date Inactive Code Status Order ID Comments User Context   10/11/2021 0520 10/13/2021 1920 Full Code [id number]  Briscoe Deutscher, MD ED   07/16/2021 2314 07/21/2021 2118 Full Code [id number]  Anselm Jungling, DO ED   03/09/2021 1807 03/14/2021 2116 Full Code [id number]  Merlene Laughter, DO Inpatient   01/22/2021 1319 01/24/2021 1752 Full Code [id number]  Steffanie Rainwater, MD ED   09/01/2019 0119 09/02/2019 2050 Full Code [id number]  Eduard Clos, MD ED   10/31/2018 0426 11/03/2018 0003 Full Code [id number]  Lorretta Harp, MD ED   04/10/2016 1902 04/15/2016 2201 Full Code [id number]  Lars Mage, PA-C Inpatient   04/10/2016 1852 04/10/2016 1901 Full Code [id number]  Beather Arbour, MD Inpatient         IV Access:   Peripheral IV   Procedures and diagnostic studies:   No results found.   Medical Consultants:   None.   Subjective:    Katie Lyons She feels better this morning tolerating her diet.  Objective:    Vitals:   12/29/22 1126 12/29/22 2304 12/30/22 0031 12/30/22 0534  BP: 106/65 130/77  123/79  Pulse: 69 94  72  Resp: 18 16  15   Temp: 98.1 F (36.7 C) (!) 101.1 F (38.4 C) 99.1 F (37.3 C) 98.5 F (36.9 C)  TempSrc:  Oral  Oral  SpO2: 98% 98%  100%  Weight:      Height:       SpO2: 100 %   Intake/Output Summary (Last 24 hours) at 12/30/2022 0745 Last data filed at 12/29/2022 2100 Gross per 24  hour  Intake 152 ml  Output 900 ml  Net -748 ml    Filed Weights   12/28/22 0442  Weight: 61.2 kg    Exam: General exam: In no acute distress. Respiratory system: Good air movement and clear to auscultation. Cardiovascular system: S1 & S2 heard, RRR. No JVD. Gastrointestinal system: Abdomen is nondistended, soft and nontender.  Extremities: No pedal edema. Skin: No rashes, lesions or ulcers Psychiatry: Judgement and insight appear normal. Mood & affect appropriate.  Data Reviewed:    Labs: Basic Metabolic Panel: Recent  Labs  Lab 12/28/22 0520 12/29/22 0441 12/30/22 0425  NA 128* 133* 132*  K 3.5 3.6 3.6  CL 103 107 103  CO2 19* 19* 22  GLUCOSE 160* 120* 182*  BUN 20 11 12   CREATININE 0.93 0.94 0.86  CALCIUM 8.8* 8.6* 8.7*  MG  --  1.5*  --     GFR Estimated Creatinine Clearance: 71.8 mL/min (by C-G formula based on SCr of 0.86 mg/dL). Liver Function Tests: Recent Labs  Lab 12/28/22 0520 12/29/22 0441  AST 70* 35  ALT 48* 52*  ALKPHOS 107 101  BILITOT 0.9 0.8  PROT 7.3 6.6  ALBUMIN 3.7 3.1*    Recent Labs  Lab 12/28/22 0520  LIPASE 26    No results for input(s): "AMMONIA" in the last 168 hours. Coagulation profile No results for input(s): "INR", "PROTIME" in the last 168 hours. COVID-19 Labs  No results for input(s): "DDIMER", "FERRITIN", "LDH", "CRP" in the last 72 hours.  Lab Results  Component Value Date   SARSCOV2NAA NEGATIVE 08/22/2022   SARSCOV2NAA NEGATIVE 10/10/2021   SARSCOV2NAA NEGATIVE 07/16/2021   SARSCOV2NAA NEGATIVE 03/09/2021    CBC: Recent Labs  Lab 12/28/22 0520 12/29/22 0441 12/30/22 0425  WBC 14.2* 14.2* 11.3*  NEUTROABS 12.9*  --  8.1*  HGB 13.3 12.7 12.7  HCT 40.8 39.7 38.7  MCV 84.1 85.9 85.1  PLT 186 144* 151    Cardiac Enzymes: No results for input(s): "CKTOTAL", "CKMB", "CKMBINDEX", "TROPONINI" in the last 168 hours. BNP (last 3 results) No results for input(s): "PROBNP" in the last 8760 hours. CBG: No results for input(s): "GLUCAP" in the last 168 hours. D-Dimer: No results for input(s): "DDIMER" in the last 72 hours. Hgb A1c: No results for input(s): "HGBA1C" in the last 72 hours. Lipid Profile: No results for input(s): "CHOL", "HDL", "LDLCALC", "TRIG", "CHOLHDL", "LDLDIRECT" in the last 72 hours. Thyroid function studies: No results for input(s): "TSH", "T4TOTAL", "T3FREE", "THYROIDAB" in the last 72 hours.  Invalid input(s): "FREET3" Anemia work up: No results for input(s): "VITAMINB12", "FOLATE", "FERRITIN", "TIBC",  "IRON", "RETICCTPCT" in the last 72 hours. Sepsis Labs: Recent Labs  Lab 12/28/22 0520 12/28/22 0922 12/29/22 0441 12/30/22 0425  WBC 14.2*  --  14.2* 11.3*  LATICACIDVEN 1.7 0.7  --   --     Microbiology Recent Results (from the past 240 hour(s))  Urine Culture (for pregnant, neutropenic or urologic patients or patients with an indwelling urinary catheter)     Status: Abnormal   Collection Time: 12/28/22  4:39 AM   Specimen: Urine, Clean Catch  Result Value Ref Range Status   Specimen Description   Final    URINE, CLEAN CATCH Performed at [facility], [address], [place], [place] [address]    Special Requests   Final    NONE Performed at [facility], [address] [place], [place] [address]    Culture (A)  Final    >=  100,000 COLONIES/mL ESCHERICHIA COLI Confirmed Extended Spectrum Beta-Lactamase [name] (ESBL).  In bloodstream infections from ESBL organisms, carbapenems are preferred over piperacillin/tazobactam. They are shown to have a lower risk of mortality.    Report Status 12/30/2022 FINAL  Final   Organism ID, Bacteria ESCHERICHIA COLI (A)  Final      Susceptibility   Escherichia coli - MIC*    AMPICILLIN >=32 RESISTANT Resistant     CEFAZOLIN >=64 RESISTANT Resistant     CEFEPIME >=32 RESISTANT Resistant     CEFTRIAXONE >=64 RESISTANT Resistant     CIPROFLOXACIN >=4 RESISTANT Resistant     GENTAMICIN <=1 SENSITIVE Sensitive     IMIPENEM <=0.25 SENSITIVE Sensitive     NITROFURANTOIN <=16 SENSITIVE Sensitive     TRIMETH/SULFA <=20 SENSITIVE Sensitive     AMPICILLIN/SULBACTAM >=32 RESISTANT Resistant     PIP/TAZO <=4 SENSITIVE Sensitive     * >=100,000 COLONIES/mL ESCHERICHIA COLI  Blood culture (routine x 2)     Status: Abnormal (Preliminary result)   Collection Time: 12/28/22  5:00 AM   Specimen: BLOOD RIGHT HAND  Result Value Ref Range Status   Specimen Description   Final    BLOOD RIGHT HAND Performed at  [facility], [address] [place], [place] [address]    Special Requests   Final    BOTTLES DRAWN AEROBIC AND ANAEROBIC Blood Culture adequate volume Performed at [facility], [address], [place], [place] [address]    Culture  Setup Time   Final    GRAM NEGATIVE RODS ANAEROBIC BOTTLE ONLY CRITICAL RESULT CALLED TO, READ BACK BY AND VERIFIED WITH: PHARMD Haze Boyden [id number] @ [phone] FH    Culture (A)  Final    ESCHERICHIA COLI SUSCEPTIBILITIES TO FOLLOW Performed at [facility], [address] [place], [place] [address]    Report Status PENDING  Incomplete  Blood Culture ID Panel (Reflexed)     Status: Abnormal   Collection Time: 12/28/22  5:00 AM  Result Value Ref Range Status   Enterococcus faecalis NOT DETECTED NOT DETECTED Final   Enterococcus Faecium NOT DETECTED NOT DETECTED Final   Listeria monocytogenes NOT DETECTED NOT DETECTED Final   Staphylococcus species NOT DETECTED NOT DETECTED Final   Staphylococcus aureus (BCID) NOT DETECTED NOT DETECTED Final   Staphylococcus epidermidis NOT DETECTED NOT DETECTED Final   Staphylococcus lugdunensis NOT DETECTED NOT DETECTED Final   Streptococcus species NOT DETECTED NOT DETECTED Final   Streptococcus agalactiae NOT DETECTED NOT DETECTED Final   Streptococcus pneumoniae NOT DETECTED NOT DETECTED Final   Streptococcus pyogenes NOT DETECTED NOT DETECTED Final   A.calcoaceticus-baumannii NOT DETECTED NOT DETECTED Final   Bacteroides fragilis NOT DETECTED NOT DETECTED Final   Enterobacterales DETECTED (A) NOT DETECTED Final    Comment: Enterobacterales represent a large order of gram negative bacteria, not a single organism. CRITICAL RESULT CALLED TO, READ BACK BY AND VERIFIED WITH: PHARMD N. GLOGOVAC [id number] @ 1915 FH    Enterobacter cloacae complex NOT DETECTED NOT DETECTED Final   Escherichia coli DETECTED (A) NOT DETECTED Final    Comment: CRITICAL RESULT CALLED TO, READ BACK BY  AND VERIFIED WITH: PHARMD NCarin Hock [id number] @ 1915 FH    Klebsiella aerogenes NOT DETECTED NOT DETECTED Final   Klebsiella oxytoca NOT DETECTED NOT DETECTED Final   Klebsiella pneumoniae NOT DETECTED NOT DETECTED Final   Proteus species NOT DETECTED NOT DETECTED Final   Salmonella species NOT DETECTED NOT DETECTED Final  Serratia marcescens NOT DETECTED NOT DETECTED Final   Haemophilus influenzae NOT DETECTED NOT DETECTED Final   Neisseria meningitidis NOT DETECTED NOT DETECTED Final   Pseudomonas aeruginosa NOT DETECTED NOT DETECTED Final   Stenotrophomonas maltophilia NOT DETECTED NOT DETECTED Final   Candida albicans NOT DETECTED NOT DETECTED Final   Candida auris NOT DETECTED NOT DETECTED Final   Candida glabrata NOT DETECTED NOT DETECTED Final   Candida krusei NOT DETECTED NOT DETECTED Final   Candida parapsilosis NOT DETECTED NOT DETECTED Final   Candida tropicalis NOT DETECTED NOT DETECTED Final   Cryptococcus neoformans/gattii NOT DETECTED NOT DETECTED Final   CTX-M ESBL DETECTED (A) NOT DETECTED Final    Comment: CRITICAL RESULT CALLED TO, READ BACK BY AND VERIFIED WITH: PHARMD NCarin Hock [id number] @ 1915 FH (NOTE) Extended spectrum beta-lactamase detected. Recommend a carbapenem as initial therapy.      Carbapenem resistance IMP NOT DETECTED NOT DETECTED Final   Carbapenem resistance KPC NOT DETECTED NOT DETECTED Final   Carbapenem resistance NDM NOT DETECTED NOT DETECTED Final   Carbapenem resist OXA 48 LIKE NOT DETECTED NOT DETECTED Final   Carbapenem resistance VIM NOT DETECTED NOT DETECTED Final    Comment: Performed at [facility], [address], [place], [place] [address]  Blood culture (routine x 2)     Status: None (Preliminary result)   Collection Time: 12/28/22  5:37 AM   Specimen: BLOOD  Result Value Ref Range Status   Specimen Description   Final    BLOOD RIGHT ANTECUBITAL Performed at [facility], [address],  [place], [place] [address]    Special Requests   Final    BOTTLES DRAWN AEROBIC AND ANAEROBIC Blood Culture adequate volume Performed at [facility], [address] [place], [place] [address]    Culture  Setup Time   Final    GRAM NEGATIVE RODS AEROBIC BOTTLE ONLY CRITICAL VALUE NOTED.  VALUE IS CONSISTENT WITH PREVIOUSLY REPORTED AND CALLED VALUE.    Culture   Final    GRAM NEGATIVE RODS IDENTIFICATION TO FOLLOW Performed at [facility], [address] [place], [place] [address]    Report Status PENDING  Incomplete     Medications:    enoxaparin (LOVENOX) injection  40 mg Subcutaneous Q24H   famotidine  20 mg Oral QHS   magnesium oxide  800 mg Oral Daily   mycophenolate  180 mg Oral BID   predniSONE  5 mg Oral Q breakfast   sodium bicarbonate  2,600 mg Oral TID   tacrolimus  7 mg Oral Daily   And   tacrolimus  6 mg Oral QHS   Continuous Infusions:  meropenem (MERREM) IV 1 g (12/30/22 0515)      LOS: 2 days   Marinda Elk  [facility]  12/30/2022, 7:45 AM

## 2022-12-31 LAB — BASIC METABOLIC PANEL
Anion gap: 7 (ref 5–15)
BUN: 12 mg/dL (ref 6–20)
CO2: 21 mmol/L — ABNORMAL LOW (ref 22–32)
Calcium: 8.9 mg/dL (ref 8.9–10.3)
Chloride: 107 mmol/L (ref 98–111)
Creatinine, Ser: 0.78 mg/dL (ref 0.44–1.00)
GFR, Estimated: >60 mL/min (ref >60)
Glucose, Bld: 105 mg/dL — ABNORMAL HIGH (ref 70–99)
Potassium: 3.8 mmol/L (ref 3.5–5.1)
Sodium: 135 mmol/L (ref 135–145)

## 2022-12-31 LAB — GLUCOSE, CAPILLARY
Glucose-Capillary: 144 mg/dL — ABNORMAL HIGH (ref 70–99)
Glucose-Capillary: 152 mg/dL — ABNORMAL HIGH (ref 70–99)
Glucose-Capillary: 199 mg/dL — ABNORMAL HIGH (ref 70–99)
Glucose-Capillary: 148 mg/dL — ABNORMAL HIGH (ref 70–99)

## 2022-12-31 MED ORDER — SODIUM CHLORIDE 0.9 % IV SOLN
1.0000 g | Freq: Three times a day (TID) | INTRAVENOUS | Status: DC
  Administered 2022-12-31 – 2023-01-01 (×3): 1 g via INTRAVENOUS
  Filled 2022-12-31 (×5): qty 20

## 2022-12-31 NOTE — Progress Notes (Signed)
[facility] PROGRESS NOTE    Progress Note  Katie Lyons  WGN:[id number] DOB: 03/15/1986 DOA: 12/28/2022 PCP: Eunice Blase, Kirstie Peri, MD     Brief Narrative:   Katie Lyons is an 37 y.o. female past medical history significant for anxiety, chronic ischemic cardiomyopathy, essential hypertension, traumatic intracerebral hemorrhage with brain injury, end-stage renal disease with a history of kidney transplant and bilobar kidney transplant comes into the ED complaining of fever body aches and fatigue along with dysuria was found to have acute kidney injury possibly pyelonephritis, CT scan of the abdomen pelvis showed inflamed right renal transplant.  Assessment/Plan:   Sepsis due to acute UTI (urinary tract infection)/pyelonephritis transplant kidney: Resume immunosuppressive therapy. Leukocytosis improving, Tmax of 98.1. Urine cultures and blood cultures positive for ESBL E. coli sensitive to Meropenem. Consult ID, will need PICC line.  Hypovolemic hyponatremia: Resolved with resuscitation.  Elevated LFTs: Improved, likely due to acute pyelonephritis  Hyperglycemia: A1c is 5.8, blood glucose continues to be elevated. It is to note she is on prednisone. Start sliding scale insulin.  GERD: Continue famotidine.  Mild thrombocytopenia: Negative infectious etiology check a CBC tomorrow morning   DVT prophylaxis: lovenox Family Communication:none Status is: Inpatient Remains inpatient appropriate because: This is due to acute pyelonephritis    Code Status:     Code Status Orders  (From admission, onward)           Start     Ordered   12/28/22 0821  Full code  Continuous       Question:  By:  Answer:  Consent: discussion documented in EHR   12/28/22 0820           Code Status History     Date Active Date Inactive Code Status Order ID Comments User Context   10/11/2021 0520 10/13/2021 1920 Full Code [id number]  Briscoe Deutscher, MD ED   07/16/2021 2314 07/21/2021 2118 Full Code [id number]  Anselm Jungling, DO ED   03/09/2021 1807 03/14/2021 2116 Full Code [id number]  Merlene Laughter, DO Inpatient   01/22/2021 1319 01/24/2021 1752 Full Code [id number]  Steffanie Rainwater, MD ED   09/01/2019 0119 09/02/2019 2050 Full Code [id number]  Eduard Clos, MD ED   10/31/2018 0426 11/03/2018 0003 Full Code [id number]  Lorretta Harp, MD ED   04/10/2016 1902 04/15/2016 2201 Full Code [id number]  Lars Mage, PA-C Inpatient   04/10/2016 1852 04/10/2016 1901 Full Code [id number]  Beather Arbour, MD Inpatient         IV Access:   Peripheral IV   Procedures and diagnostic studies:   No results found.   Medical Consultants:   None.   Subjective:    Katie Lyons feels better tolerating her diet.  Objective:    Vitals:   12/30/22 0534 12/30/22 1338 12/30/22 2113 12/31/22 0544  BP: 123/79 105/73 (!) 140/89 129/84  Pulse: 72 61 73 70  Resp: 15 20 18 16   Temp: 98.5 F (36.9 C) 98 F (36.7 C) 98.1 F (36.7 C) 98.2 F (36.8 C)  TempSrc: Oral Oral Oral Oral  SpO2: 100% 100% 100% 100%  Weight:      Height:       SpO2: 100 %   Intake/Output Summary (Last 24 hours) at 12/31/2022 0828 Last data filed at 12/31/2022 0547 Gross per 24 hour  Intake 860 ml  Output --  Net 860 ml    Filed Weights   12/28/22 0442  Weight: 61.2 kg  Exam: General exam: In no acute distress. Respiratory system: Good air movement and clear to auscultation. Cardiovascular system: S1 & S2 heard, RRR. No JVD. Gastrointestinal system: Abdomen is nondistended, soft and nontender.  Extremities: No pedal edema. Skin: No rashes, lesions or ulcers Psychiatry: Judgement and insight appear normal. Mood & affect appropriate.  Data Reviewed:    Labs: Basic Metabolic Panel: Recent Labs  Lab 12/28/22 0520 12/29/22 0441 12/30/22 0425 12/30/22 0859 12/31/22 0436  NA 128* 133* 132*  --  135  K 3.5 3.6 3.6  --  3.8   CL 103 107 103  --  107  CO2 19* 19* 22  --  21*  GLUCOSE 160* 120* 182*  --  105*  BUN 20 11 12   --  12  CREATININE 0.93 0.94 0.86  --  0.78  CALCIUM 8.8* 8.6* 8.7*  --  8.9  MG  --  1.5*  --  1.5*  --     GFR Estimated Creatinine Clearance: 77.2 mL/min (by C-G formula based on SCr of 0.78 mg/dL). Liver Function Tests: Recent Labs  Lab 12/28/22 0520 12/29/22 0441  AST 70* 35  ALT 48* 52*  ALKPHOS 107 101  BILITOT 0.9 0.8  PROT 7.3 6.6  ALBUMIN 3.7 3.1*    Recent Labs  Lab 12/28/22 0520  LIPASE 26    No results for input(s): "AMMONIA" in the last 168 hours. Coagulation profile No results for input(s): "INR", "PROTIME" in the last 168 hours. COVID-19 Labs  No results for input(s): "DDIMER", "FERRITIN", "LDH", "CRP" in the last 72 hours.  Lab Results  Component Value Date   SARSCOV2NAA NEGATIVE 08/22/2022   SARSCOV2NAA NEGATIVE 10/10/2021   SARSCOV2NAA NEGATIVE 07/16/2021   SARSCOV2NAA NEGATIVE 03/09/2021    CBC: Recent Labs  Lab 12/28/22 0520 12/29/22 0441 12/30/22 0425  WBC 14.2* 14.2* 11.3*  NEUTROABS 12.9*  --  8.1*  HGB 13.3 12.7 12.7  HCT 40.8 39.7 38.7  MCV 84.1 85.9 85.1  PLT 186 144* 151    Cardiac Enzymes: No results for input(s): "CKTOTAL", "CKMB", "CKMBINDEX", "TROPONINI" in the last 168 hours. BNP (last 3 results) No results for input(s): "PROBNP" in the last 8760 hours. CBG: Recent Labs  Lab 12/30/22 0848 12/30/22 1236 12/30/22 1640 12/30/22 2114 12/31/22 0726  GLUCAP 119* 235* 110* 192* 144*   D-Dimer: No results for input(s): "DDIMER" in the last 72 hours. Hgb A1c: Recent Labs    12/30/22 0859  HGBA1C 5.8*   Lipid Profile: No results for input(s): "CHOL", "HDL", "LDLCALC", "TRIG", "CHOLHDL", "LDLDIRECT" in the last 72 hours. Thyroid function studies: No results for input(s): "TSH", "T4TOTAL", "T3FREE", "THYROIDAB" in the last 72 hours.  Invalid input(s): "FREET3" Anemia work up: No results for input(s):  "VITAMINB12", "FOLATE", "FERRITIN", "TIBC", "IRON", "RETICCTPCT" in the last 72 hours. Sepsis Labs: Recent Labs  Lab 12/28/22 0520 12/28/22 0922 12/29/22 0441 12/30/22 0425  WBC 14.2*  --  14.2* 11.3*  LATICACIDVEN 1.7 0.7  --   --     Microbiology Recent Results (from the past 240 hour(s))  Urine Culture (for pregnant, neutropenic or urologic patients or patients with an indwelling urinary catheter)     Status: Abnormal   Collection Time: 12/28/22  4:39 AM   Specimen: Urine, Clean Catch  Result Value Ref Range Status   Specimen Description   Final    URINE, CLEAN CATCH Performed at [facility], [address], [place], [place] [address]    Special Requests   Final  NONE Performed at [facility], [address], [place], [place] [address]    Culture (A)  Final    >=100,000 COLONIES/mL ESCHERICHIA COLI Confirmed Extended Spectrum Beta-Lactamase [name] (ESBL).  In bloodstream infections from ESBL organisms, carbapenems are preferred over piperacillin/tazobactam. They are shown to have a lower risk of mortality.    Report Status 12/30/2022 FINAL  Final   Organism ID, Bacteria ESCHERICHIA COLI (A)  Final      Susceptibility   Escherichia coli - MIC*    AMPICILLIN >=32 RESISTANT Resistant     CEFAZOLIN >=64 RESISTANT Resistant     CEFEPIME >=32 RESISTANT Resistant     CEFTRIAXONE >=64 RESISTANT Resistant     CIPROFLOXACIN >=4 RESISTANT Resistant     GENTAMICIN <=1 SENSITIVE Sensitive     IMIPENEM <=0.25 SENSITIVE Sensitive     NITROFURANTOIN <=16 SENSITIVE Sensitive     TRIMETH/SULFA <=20 SENSITIVE Sensitive     AMPICILLIN/SULBACTAM >=32 RESISTANT Resistant     PIP/TAZO <=4 SENSITIVE Sensitive     * >=100,000 COLONIES/mL ESCHERICHIA COLI  Blood culture (routine x 2)     Status: Abnormal   Collection Time: 12/28/22  5:00 AM   Specimen: BLOOD RIGHT HAND  Result Value Ref Range Status   Specimen Description   Final    BLOOD RIGHT  HAND Performed at [facility], [address], [place], [place] [address]    Special Requests   Final    BOTTLES DRAWN AEROBIC AND ANAEROBIC Blood Culture adequate volume Performed at [facility], [address] [place], [place] [address]    Culture  Setup Time   Final    GRAM NEGATIVE RODS ANAEROBIC BOTTLE ONLY CRITICAL RESULT CALLED TO, READ BACK BY AND VERIFIED WITH: Abe People [id number] @ [id number] FH Performed at [facility], [address], [place], [place] [address]    Culture (A)  Final    ESCHERICHIA COLI Confirmed Extended Spectrum Beta-Lactamase [name] (ESBL).  In bloodstream infections from ESBL organisms, carbapenems are preferred over piperacillin/tazobactam. They are shown to have a lower risk of mortality.    Report Status 12/30/2022 FINAL  Final   Organism ID, Bacteria ESCHERICHIA COLI  Final      Susceptibility   Escherichia coli - MIC*    AMPICILLIN >=32 RESISTANT Resistant     CEFEPIME 16 RESISTANT Resistant     CEFTAZIDIME RESISTANT Resistant     CEFTRIAXONE >=64 RESISTANT Resistant     CIPROFLOXACIN >=4 RESISTANT Resistant     GENTAMICIN <=1 SENSITIVE Sensitive     IMIPENEM <=0.25 SENSITIVE Sensitive     TRIMETH/SULFA <=20 SENSITIVE Sensitive     AMPICILLIN/SULBACTAM >=32 RESISTANT Resistant     PIP/TAZO 8 SENSITIVE Sensitive     * ESCHERICHIA COLI  Blood Culture ID Panel (Reflexed)     Status: Abnormal   Collection Time: 12/28/22  5:00 AM  Result Value Ref Range Status   Enterococcus faecalis NOT DETECTED NOT DETECTED Final   Enterococcus Faecium NOT DETECTED NOT DETECTED Final   Listeria monocytogenes NOT DETECTED NOT DETECTED Final   Staphylococcus species NOT DETECTED NOT DETECTED Final   Staphylococcus aureus (BCID) NOT DETECTED NOT DETECTED Final   Staphylococcus epidermidis NOT DETECTED NOT DETECTED Final   Staphylococcus lugdunensis NOT DETECTED NOT DETECTED Final   Streptococcus species NOT  DETECTED NOT DETECTED Final   Streptococcus agalactiae NOT DETECTED NOT DETECTED Final   Streptococcus pneumoniae NOT DETECTED NOT DETECTED Final   Streptococcus  pyogenes NOT DETECTED NOT DETECTED Final   A.calcoaceticus-baumannii NOT DETECTED NOT DETECTED Final   Bacteroides fragilis NOT DETECTED NOT DETECTED Final   Enterobacterales DETECTED (A) NOT DETECTED Final    Comment: Enterobacterales represent a large order of gram negative bacteria, not a single organism. CRITICAL RESULT CALLED TO, READ BACK BY AND VERIFIED WITH: PHARMD N. GLOGOVAC [id number] @ 1915 FH    Enterobacter cloacae complex NOT DETECTED NOT DETECTED Final   Escherichia coli DETECTED (A) NOT DETECTED Final    Comment: CRITICAL RESULT CALLED TO, READ BACK BY AND VERIFIED WITH: PHARMD NCarin Hock [id number] @ 1915 FH    Klebsiella aerogenes NOT DETECTED NOT DETECTED Final   Klebsiella oxytoca NOT DETECTED NOT DETECTED Final   Klebsiella pneumoniae NOT DETECTED NOT DETECTED Final   Proteus species NOT DETECTED NOT DETECTED Final   Salmonella species NOT DETECTED NOT DETECTED Final   Serratia marcescens NOT DETECTED NOT DETECTED Final   Haemophilus influenzae NOT DETECTED NOT DETECTED Final   Neisseria meningitidis NOT DETECTED NOT DETECTED Final   Pseudomonas aeruginosa NOT DETECTED NOT DETECTED Final   Stenotrophomonas maltophilia NOT DETECTED NOT DETECTED Final   Candida albicans NOT DETECTED NOT DETECTED Final   Candida auris NOT DETECTED NOT DETECTED Final   Candida glabrata NOT DETECTED NOT DETECTED Final   Candida krusei NOT DETECTED NOT DETECTED Final   Candida parapsilosis NOT DETECTED NOT DETECTED Final   Candida tropicalis NOT DETECTED NOT DETECTED Final   Cryptococcus neoformans/gattii NOT DETECTED NOT DETECTED Final   CTX-M ESBL DETECTED (A) NOT DETECTED Final    Comment: CRITICAL RESULT CALLED TO, READ BACK BY AND VERIFIED WITH: PHARMD NCarin Hock [id number] @ 1915 FH (NOTE) Extended spectrum beta-lactamase  detected. Recommend a carbapenem as initial therapy.      Carbapenem resistance IMP NOT DETECTED NOT DETECTED Final   Carbapenem resistance KPC NOT DETECTED NOT DETECTED Final   Carbapenem resistance NDM NOT DETECTED NOT DETECTED Final   Carbapenem resist OXA 48 LIKE NOT DETECTED NOT DETECTED Final   Carbapenem resistance VIM NOT DETECTED NOT DETECTED Final    Comment: Performed at [facility], [address] [place], [place] [address]  Blood culture (routine x 2)     Status: Abnormal   Collection Time: 12/28/22  5:37 AM   Specimen: BLOOD  Result Value Ref Range Status   Specimen Description   Final    BLOOD RIGHT ANTECUBITAL Performed at [facility], [address] [place], [place] [address]    Special Requests   Final    BOTTLES DRAWN AEROBIC AND ANAEROBIC Blood Culture adequate volume Performed at [facility], [address], [place], [place] [address]    Culture  Setup Time   Final    GRAM NEGATIVE RODS AEROBIC BOTTLE ONLY CRITICAL VALUE NOTED.  VALUE IS CONSISTENT WITH PREVIOUSLY REPORTED AND CALLED VALUE.    Culture (A)  Final    ESCHERICHIA COLI SUSCEPTIBILITIES PERFORMED ON PREVIOUS CULTURE WITHIN THE LAST 5 DAYS. Performed at [facility], [address], [place], [place] [address]    Report Status 12/30/2022 FINAL  Final     Medications:    enoxaparin (LOVENOX) injection  40 mg Subcutaneous Q24H   famotidine  20 mg Oral QHS   insulin aspart  0-5 Units Subcutaneous QHS   insulin aspart  0-6 Units Subcutaneous TID WC   insulin aspart  2 Units Subcutaneous TID WC   magnesium oxide  800 mg Oral  Daily   mycophenolate  180 mg Oral BID   predniSONE  5 mg Oral Q breakfast   sodium bicarbonate  2,600 mg Oral TID   tacrolimus  7 mg Oral Daily   And   tacrolimus  6 mg Oral QHS   Continuous Infusions:  meropenem (MERREM) IV 2 g (12/31/22 0508)      LOS: 3 days   Marinda Elk  [facility]  12/31/2022, 8:28 AM

## 2023-01-01 ENCOUNTER — Other Ambulatory Visit: Payer: Self-pay

## 2023-01-01 DIAGNOSIS — R739 Hyperglycemia, unspecified: Secondary | ICD-10-CM

## 2023-01-01 DIAGNOSIS — B9620 Unspecified Escherichia coli [E. coli] as the cause of diseases classified elsewhere: Secondary | ICD-10-CM

## 2023-01-01 DIAGNOSIS — Z940 Kidney transplant status: Secondary | ICD-10-CM

## 2023-01-01 DIAGNOSIS — Z1612 Extended spectrum beta lactamase (ESBL) resistance: Secondary | ICD-10-CM

## 2023-01-01 LAB — BASIC METABOLIC PANEL
Anion gap: 8 (ref 5–15)
BUN: 15 mg/dL (ref 6–20)
CO2: 22 mmol/L (ref 22–32)
Calcium: 9.5 mg/dL (ref 8.9–10.3)
Chloride: 104 mmol/L (ref 98–111)
Creatinine, Ser: 0.69 mg/dL (ref 0.44–1.00)
GFR, Estimated: >60 mL/min (ref >60)
Glucose, Bld: 136 mg/dL — ABNORMAL HIGH (ref 70–99)
Potassium: 4 mmol/L (ref 3.5–5.1)
Sodium: 134 mmol/L — ABNORMAL LOW (ref 135–145)

## 2023-01-01 LAB — GLUCOSE, CAPILLARY
Glucose-Capillary: 119 mg/dL — ABNORMAL HIGH (ref 70–99)
Glucose-Capillary: 147 mg/dL — ABNORMAL HIGH (ref 70–99)
Glucose-Capillary: 221 mg/dL — ABNORMAL HIGH (ref 70–99)
Glucose-Capillary: 136 mg/dL — ABNORMAL HIGH (ref 70–99)

## 2023-01-01 MED ORDER — CHLORHEXIDINE GLUCONATE CLOTH 2 % EX PADS
6.0000 | MEDICATED_PAD | Freq: Every day | CUTANEOUS | Status: DC
  Administered 2023-01-02 (×2): 6 via TOPICAL

## 2023-01-01 MED ORDER — SODIUM CHLORIDE 0.9% FLUSH: 10.0000 mL | INTRAVENOUS | Status: DC | PRN

## 2023-01-01 MED ORDER — SODIUM CHLORIDE 0.9 % IV SOLN
1.0000 g | Freq: Every day | INTRAVENOUS | Status: DC
  Administered 2023-01-01 – 2023-01-02 (×2): 1000 mg via INTRAVENOUS
  Filled 2023-01-01 (×2): qty 1

## 2023-01-01 MED ORDER — MAGNESIUM OXIDE -MG SUPPLEMENT 400 (240 MG) MG PO TABS
800.0000 mg | ORAL_TABLET | Freq: Two times a day (BID) | ORAL | Status: DC
  Administered 2023-01-01 – 2023-01-02 (×2): 800 mg via ORAL
  Filled 2023-01-01 (×2): qty 2

## 2023-01-01 NOTE — Progress Notes (Signed)
Spoke with Dr. Allena Katz with nephrology regarding PICC placement. MD ok with placement on right arm only, as there is an AVF in left arm.

## 2023-01-01 NOTE — Consult Note (Signed)
[facility]    Date of Admission:  12/28/2022   Total days of inpatient antibiotics 5        Reason for Consult: ESBL ecoli bacteremia    Principal Problem:   Acute UTI (urinary tract infection) Active Problems:   HTN (hypertension)   GERD (gastroesophageal reflux disease)   Pyelonephritis of transplanted kidney   Hyponatremia   Transaminitis   Hyperglycemia   Assessment: 37 year old Spanish  speaking female admitted with: #E. coli ESBL bacteremia secondary to UTI #Renal transplant on immunosuppression with Prograf and Myfortic - Patient has had prior history of pyelonephritis was treated with 2-week course of antibiotics. - Fever with WBC 14 K on admission, she reported increased urinary frequency with some tingling.  UTI symptoms have resolved while being on antibiotics.  Initial CT showed right lower quadrant renal transplant appears inflamed but not obviously obstructed, consider transplant pyelonephritis, ascending urinary infection versus acute intrinsic renal disease. - She was started on meropenem, ID engaged for final antibiotic recommendations.  Recommendations:  -Engage nephrology for management of immunosuppression as patient admitted for bacteremia, follows with nephrology outpatient - PICC line - Transition to ertapenem to complete 2 weeks antibiotics, EOT 5/30 - Follow-up with infectious disease outpatient at the end of treatment, will sign off  OPAT ORDERS:  Diagnosis: ESBL bacteremia 2/2 UTI  Culture Result: ESBL Ecoli  No Known Allergies   Discharge antibiotics to be given via PICC line:  Per pharmacy protocol ertapenem 1gm q 24h    Duration: 2 weeks End Date: 5/30  [facility] Care Per Protocol with Biopatch Use: Home health RN for IV administration and teaching, line care and labs.    Labs weekly while on IV antibiotics: _x_ CBC with differential __ BMP **TWICE WEEKLY ON VANCOMYCIN  __ xCMP __ CRP __  ESR __ Vancomycin trough TWICE WEEKLY __ CK  _x_ Please pull PIC at completion of IV antibiotics __ Please leave PIC in place until doctor has seen patient or been notified  Fax weekly labs to [phone]  Clinic Follow Up Appt: 6/3  @ [facility] with Dr. Thedore Mins  Microbiology:   Antibiotics: Meropenem 5/15-present  Cultures: Blood 5/15 ESBL E. coli Urine  Other /15 ESBL E. coli  HPI: Katie Lyons is a 37 y.o. female anxiety, chronic ischemia, cardiomyopathy, hypertension, traumatic intracerebral hemorrhage with brain injury, end-stage renal disease with history of renal transplant on Myfortic and Prograf followed by [facility] transplant and nephrology presented to ED complaining of fever, body aches.  CT showed signs of pyelonephritis.  Urine and blood cultures grew ESBL E. coli.  Patient prior stone on meropenem.  ID engaged for antibiotic recommendations.   Review of Systems: Review of Systems  All other systems reviewed and are negative.   Past Medical History:  Diagnosis Date   Anxiety    Chronic ischemic heart disease    Depression    3-4 years ago per husband   End stage renal disease (HCC)    Headache    Hypertension    Iron deficiency    Secondary hyperparathyroidism (HCC)    Traumatic brain injury (HCC)     Social History   Tobacco Use   Smoking status: Never   Smokeless tobacco: Never  Vaping Use   Vaping Use: Never used  Substance Use Topics   Alcohol use: No   Drug use: No    History reviewed. No pertinent family history. Scheduled Meds:  enoxaparin (  LOVENOX) injection  40 mg Subcutaneous Q24H   famotidine  20 mg Oral QHS   insulin aspart  0-5 Units Subcutaneous QHS   insulin aspart  0-6 Units Subcutaneous TID WC   insulin aspart  2 Units Subcutaneous TID WC   magnesium oxide  800 mg Oral Daily   mycophenolate  180 mg Oral BID   predniSONE  5 mg Oral Q breakfast   sodium bicarbonate  2,600 mg Oral TID   tacrolimus  7 mg Oral Daily    And   tacrolimus  6 mg Oral QHS   Continuous Infusions:  meropenem (MERREM) IV 1 g (01/01/23 0541)   PRN Meds:.acetaminophen **OR** acetaminophen, naLOXone (NARCAN)  injection, ondansetron **OR** ondansetron (ZOFRAN) IV, oxyCODONE No Known Allergies  OBJECTIVE: Blood pressure 131/75, pulse 61, temperature 98.3 F (36.8 C), resp. rate 17, height 5' (1.524 m), weight 61.2 kg, SpO2 98 %.  Physical Exam Constitutional:      Appearance: Normal appearance.  HENT:     Head: Normocephalic and atraumatic.     Right Ear: Tympanic membrane normal.     Left Ear: Tympanic membrane normal.     Nose: Nose normal.     Mouth/Throat:     Mouth: Mucous membranes are moist.  Eyes:     Extraocular Movements: Extraocular movements intact.     Conjunctiva/sclera: Conjunctivae normal.     Pupils: Pupils are equal, round, and reactive to light.  Cardiovascular:     Rate and Rhythm: Normal rate and regular rhythm.     Heart sounds: No murmur heard.    No friction rub. No gallop.  Pulmonary:     Effort: Pulmonary effort is normal.     Breath sounds: Normal breath sounds.  Abdominal:     General: Abdomen is flat.     Palpations: Abdomen is soft.  Musculoskeletal:        General: Normal range of motion.  Skin:    General: Skin is warm and dry.  Neurological:     General: No focal deficit present.     Mental Status: She is alert and oriented to person, place, and time.  Psychiatric:        Mood and Affect: Mood normal.     Lab Results Lab Results  Component Value Date   WBC 11.3 (H) 12/30/2022   HGB 12.7 12/30/2022   HCT 38.7 12/30/2022   MCV 85.1 12/30/2022   PLT 151 12/30/2022    Lab Results  Component Value Date   CREATININE 0.69 01/01/2023   BUN 15 01/01/2023   NA 134 (L) 01/01/2023   K 4.0 01/01/2023   CL 104 01/01/2023   CO2 22 01/01/2023    Lab Results  Component Value Date   ALT 52 (H) 12/29/2022   AST 35 12/29/2022   ALKPHOS 101 12/29/2022   BILITOT 0.8  12/29/2022       Danelle Earthly, MD [facility] 01/01/2023, 11:22 AM   I have personally spent 82 minutes involved in face-to-face and non-face-to-face activities for this patient on the day of the visit. Professional time spent includes the following activities: Preparing to see the patient (review of tests), Obtaining and/or reviewing separately obtained history (admission/discharge record), Performing a medically appropriate examination and/or evaluation , Ordering medications/tests/procedures, referring and communicating with other health care professionals, Documenting clinical information in the EMR, Independently interpreting results (not separately reported), Communicating results to the patient/family/caregiver, Counseling and educating the patient/family/caregiver and Care  coordination (not separately reported).

## 2023-01-01 NOTE — Progress Notes (Signed)
Peripherally Inserted Central Catheter Placement  The IV Nurse has discussed with the patient and/or persons authorized to consent for the patient, the purpose of this procedure and the potential benefits and risks involved with this procedure.  The benefits include less needle sticks, lab draws from the catheter, and the patient may be discharged home with the catheter. Risks include, but not limited to, infection, bleeding, blood clot (thrombus formation), and puncture of an artery; nerve damage and irregular heartbeat and possibility to perform a PICC exchange if needed/ordered by physician.  Alternatives to this procedure were also discussed.  Bard Power PICC patient education guide, fact sheet on infection prevention and patient information card has been provided to patient /or left at bedside.  PICC inserted by Elenore Paddy, RN   PICC Placement Documentation  PICC Single Lumen 01/01/23 Right Basilic 37 cm 1 cm (Active)  Indication for Insertion or Continuance of Line Prolonged intravenous therapies;Home intravenous therapies (PICC only) 01/01/23 1844  Exposed Catheter (cm) 1 cm 01/01/23 1844  Site Assessment Clean, Dry, Intact 01/01/23 1844  Line Status Flushed;Saline locked;Blood return noted 01/01/23 1844  Dressing Type Transparent;Securing device 01/01/23 1844  Dressing Status Antimicrobial disc in place;Clean, Dry, Intact 01/01/23 1844  Safety Lock Not Applicable 01/01/23 1844  Line Care Connections checked and tightened 01/01/23 1844  Dressing Intervention New dressing 01/01/23 1844  Dressing Change Due 01/08/23 01/01/23 1844       Bran Aldridge, Lajean Manes 01/01/2023, 6:44 PM

## 2023-01-01 NOTE — TOC Progression Note (Signed)
Transition of Care [facility]) - Progression Note    Patient Details  Name: Breeanna Hoelle MRN: [id number] Date of Birth: Dec 19, 1985  Transition of Care [facility]) CM/SW Contact  Amada Jupiter, LCSW Phone Number: 01/01/2023, 1:11 PM  Clinical Narrative:     Met with pt and daughter today to discuss anticipated need for IV abx at home.  Both aware and agreeable and daughter notes pt has done this before and she (daughter) was the one who assisted with the administration.  No agency preferences - have alerted Jeri Modena with [employer] and she will follow along to set up education.    Expected Discharge Plan: Home/Self Care Barriers to Discharge: Continued Medical Work up  Expected Discharge Plan and Services   Discharge Planning Services: CM Consult Post Acute Care Choice: NA Living arrangements for the past 2 months: Single Family Home                   DME Agency: NA       HH Arranged: NA HH Agency: NA         Social Determinants of Health (SDOH) Interventions SDOH Screenings   Tobacco Use: Low Risk  (12/28/2022)    Readmission Risk Interventions    01/01/2023    1:11 PM  Readmission Risk Prevention Plan  Post Dischage Appt Complete  Medication Screening Complete  Transportation Screening Complete
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
## 2023-01-01 NOTE — Progress Notes (Signed)
[facility] PROGRESS NOTE    Progress Note  Katie Lyons  KGM:[id number] DOB: Aug 12, 1986 DOA: 12/28/2022 PCP: Eunice Blase, Kirstie Peri, MD     Brief Narrative:   Katie Lyons is an 37 y.o. female past medical history significant for anxiety, chronic ischemic cardiomyopathy, essential hypertension, traumatic intracerebral hemorrhage with brain injury, end-stage renal disease with a history of kidney transplant and bilobar kidney transplant comes into the ED complaining of fever body aches and fatigue along with dysuria was found to have acute kidney injury possibly pyelonephritis, CT scan of the abdomen pelvis showed inflamed right renal transplant.  Assessment/Plan:   Sepsis due to acute UTI (urinary tract infection)/pyelonephritis transplant kidney: Continue immunosuppressive therapy. Leukocytosis improving, Tmax of 98.1. Urine cultures and blood cultures positive for ESBL E. coli sensitive to Meropenem. Will consult infectious disease.  Hypovolemic hyponatremia: Resolved with resuscitation.  Elevated LFTs: Improved, likely due to acute pyelonephritis  Hyperglycemia: A1c is 5.8, blood glucose continues to be elevated. It is to note she is on prednisone. Start sliding scale insulin.  GERD: Continue famotidine.  Mild thrombocytopenia: Negative infectious etiology check a CBC tomorrow morning   DVT prophylaxis: lovenox Family Communication:none Status is: Inpatient Remains inpatient appropriate because: This is due to acute pyelonephritis    Code Status:     Code Status Orders  (From admission, onward)           Start     Ordered   12/28/22 0821  Full code  Continuous       Question:  By:  Answer:  Consent: discussion documented in EHR   12/28/22 0820           Code Status History     Date Active Date Inactive Code Status Order ID Comments User Context   10/11/2021 0520 10/13/2021 1920 Full Code [id number]  Briscoe Deutscher, MD ED   07/16/2021 2314 07/21/2021 2118 Full Code [id number]  Anselm Jungling, DO ED   03/09/2021 1807 03/14/2021 2116 Full Code [id number]  Merlene Laughter, DO Inpatient   01/22/2021 1319 01/24/2021 1752 Full Code [id number]  Steffanie Rainwater, MD ED   09/01/2019 0119 09/02/2019 2050 Full Code [id number]  Eduard Clos, MD ED   10/31/2018 0426 11/03/2018 0003 Full Code [id number]  Lorretta Harp, MD ED   04/10/2016 1902 04/15/2016 2201 Full Code [id number]  Lars Mage, PA-C Inpatient   04/10/2016 1852 04/10/2016 1901 Full Code [id number]  Beather Arbour, MD Inpatient         IV Access:   Peripheral IV   Procedures and diagnostic studies:   No results found.   Medical Consultants:   None.   Subjective:    Katie Lyons tolerating her diet no complaints  Objective:    Vitals:   12/31/22 0544 12/31/22 1342 12/31/22 2143 01/01/23 0526  BP: 129/84 121/84 131/84 131/75  Pulse: 70 70 (!) 51 61  Resp: 16 18 17 17   Temp: 98.2 F (36.8 C) 98.1 F (36.7 C) 98 F (36.7 C) 98.3 F (36.8 C)  TempSrc: Oral Oral    SpO2: 100% 100% 100% 98%  Weight:      Height:       SpO2: 98 %   Intake/Output Summary (Last 24 hours) at 01/01/2023 0824 Last data filed at 01/01/2023 0600 Gross per 24 hour  Intake 1400 ml  Output --  Net 1400 ml    Filed Weights   12/28/22 0442  Weight: 61.2 kg    Exam:  General exam: In no acute distress. Respiratory system: Good air movement and clear to auscultation. Cardiovascular system: S1 & S2 heard, RRR. No JVD. Gastrointestinal system: Abdomen is nondistended, soft and nontender.  Extremities: No pedal edema. Skin: No rashes, lesions or ulcers Psychiatry: Judgement and insight appear normal. Mood & affect appropriate.  Data Reviewed:    Labs: Basic Metabolic Panel: Recent Labs  Lab 12/28/22 0520 12/29/22 0441 12/30/22 0425 12/30/22 0859 12/31/22 0436 01/01/23 0429  NA 128* 133* 132*  --  135 134*  K 3.5 3.6 3.6   --  3.8 4.0  CL 103 107 103  --  107 104  CO2 19* 19* 22  --  21* 22  GLUCOSE 160* 120* 182*  --  105* 136*  BUN 20 11 12   --  12 15  CREATININE 0.93 0.94 0.86  --  0.78 0.69  CALCIUM 8.8* 8.6* 8.7*  --  8.9 9.5  MG  --  1.5*  --  1.5*  --   --     GFR Estimated Creatinine Clearance: 77.2 mL/min (by C-G formula based on SCr of 0.69 mg/dL). Liver Function Tests: Recent Labs  Lab 12/28/22 0520 12/29/22 0441  AST 70* 35  ALT 48* 52*  ALKPHOS 107 101  BILITOT 0.9 0.8  PROT 7.3 6.6  ALBUMIN 3.7 3.1*    Recent Labs  Lab 12/28/22 0520  LIPASE 26    No results for input(s): "AMMONIA" in the last 168 hours. Coagulation profile No results for input(s): "INR", "PROTIME" in the last 168 hours. COVID-19 Labs  No results for input(s): "DDIMER", "FERRITIN", "LDH", "CRP" in the last 72 hours.  Lab Results  Component Value Date   SARSCOV2NAA NEGATIVE 08/22/2022   SARSCOV2NAA NEGATIVE 10/10/2021   SARSCOV2NAA NEGATIVE 07/16/2021   SARSCOV2NAA NEGATIVE 03/09/2021    CBC: Recent Labs  Lab 12/28/22 0520 12/29/22 0441 12/30/22 0425  WBC 14.2* 14.2* 11.3*  NEUTROABS 12.9*  --  8.1*  HGB 13.3 12.7 12.7  HCT 40.8 39.7 38.7  MCV 84.1 85.9 85.1  PLT 186 144* 151    Cardiac Enzymes: No results for input(s): "CKTOTAL", "CKMB", "CKMBINDEX", "TROPONINI" in the last 168 hours. BNP (last 3 results) No results for input(s): "PROBNP" in the last 8760 hours. CBG: Recent Labs  Lab 12/31/22 0726 12/31/22 1115 12/31/22 1634 12/31/22 2147 01/01/23 0740  GLUCAP 144* 152* 199* 148* 119*    D-Dimer: No results for input(s): "DDIMER" in the last 72 hours. Hgb A1c: Recent Labs    12/30/22 0859  HGBA1C 5.8*    Lipid Profile: No results for input(s): "CHOL", "HDL", "LDLCALC", "TRIG", "CHOLHDL", "LDLDIRECT" in the last 72 hours. Thyroid function studies: No results for input(s): "TSH", "T4TOTAL", "T3FREE", "THYROIDAB" in the last 72 hours.  Invalid input(s): "FREET3" Anemia  work up: No results for input(s): "VITAMINB12", "FOLATE", "FERRITIN", "TIBC", "IRON", "RETICCTPCT" in the last 72 hours. Sepsis Labs: Recent Labs  Lab 12/28/22 0520 12/28/22 0922 12/29/22 0441 12/30/22 0425  WBC 14.2*  --  14.2* 11.3*  LATICACIDVEN 1.7 0.7  --   --     Microbiology Recent Results (from the past 240 hour(s))  Urine Culture (for pregnant, neutropenic or urologic patients or patients with an indwelling urinary catheter)     Status: Abnormal   Collection Time: 12/28/22  4:39 AM   Specimen: Urine, Clean Catch  Result Value Ref Range Status   Specimen Description   Final    URINE, CLEAN CATCH Performed at [facility], [address] [place], [place] [address]    Special Requests   Final    NONE Performed at [facility], [address] [place], [place] [address]    Culture (A)  Final    >=100,000 COLONIES/mL ESCHERICHIA COLI Confirmed Extended Spectrum Beta-Lactamase [name] (ESBL).  In bloodstream infections from ESBL organisms, carbapenems are preferred over piperacillin/tazobactam. They are shown to have a lower risk of mortality.    Report Status 12/30/2022 FINAL  Final   Organism ID, Bacteria ESCHERICHIA COLI (A)  Final      Susceptibility   Escherichia coli - MIC*    AMPICILLIN >=32 RESISTANT Resistant     CEFAZOLIN >=64 RESISTANT Resistant     CEFEPIME >=32 RESISTANT Resistant     CEFTRIAXONE >=64 RESISTANT Resistant     CIPROFLOXACIN >=4 RESISTANT Resistant     GENTAMICIN <=1 SENSITIVE Sensitive     IMIPENEM <=0.25 SENSITIVE Sensitive     NITROFURANTOIN <=16 SENSITIVE Sensitive     TRIMETH/SULFA <=20 SENSITIVE Sensitive     AMPICILLIN/SULBACTAM >=32 RESISTANT Resistant     PIP/TAZO <=4 SENSITIVE Sensitive     * >=100,000 COLONIES/mL ESCHERICHIA COLI  Blood culture (routine x 2)     Status: Abnormal   Collection Time: 12/28/22  5:00 AM   Specimen: BLOOD RIGHT HAND  Result Value Ref Range Status   Specimen  Description   Final    BLOOD RIGHT HAND Performed at [facility], [address], [place], [place] [address]    Special Requests   Final    BOTTLES DRAWN AEROBIC AND ANAEROBIC Blood Culture adequate volume Performed at [facility], [address], [place], [place] [address]    Culture  Setup Time   Final    GRAM NEGATIVE RODS ANAEROBIC BOTTLE ONLY CRITICAL RESULT CALLED TO, READ BACK BY AND VERIFIED WITH: Abe People [id number] @ [phone] FH Performed at [facility], [address] [place], [place] [address]    Culture (A)  Final    ESCHERICHIA COLI Confirmed Extended Spectrum Beta-Lactamase [name] (ESBL).  In bloodstream infections from ESBL organisms, carbapenems are preferred over piperacillin/tazobactam. They are shown to have a lower risk of mortality.    Report Status 12/30/2022 FINAL  Final   Organism ID, Bacteria ESCHERICHIA COLI  Final      Susceptibility   Escherichia coli - MIC*    AMPICILLIN >=32 RESISTANT Resistant     CEFEPIME 16 RESISTANT Resistant     CEFTAZIDIME RESISTANT Resistant     CEFTRIAXONE >=64 RESISTANT Resistant     CIPROFLOXACIN >=4 RESISTANT Resistant     GENTAMICIN <=1 SENSITIVE Sensitive     IMIPENEM <=0.25 SENSITIVE Sensitive     TRIMETH/SULFA <=20 SENSITIVE Sensitive     AMPICILLIN/SULBACTAM >=32 RESISTANT Resistant     PIP/TAZO 8 SENSITIVE Sensitive     * ESCHERICHIA COLI  Blood Culture ID Panel (Reflexed)     Status: Abnormal   Collection Time: 12/28/22  5:00 AM  Result Value Ref Range Status   Enterococcus faecalis NOT DETECTED NOT DETECTED Final   Enterococcus Faecium NOT DETECTED NOT DETECTED Final   Listeria monocytogenes NOT DETECTED NOT DETECTED Final   Staphylococcus species NOT DETECTED NOT DETECTED Final   Staphylococcus aureus (BCID) NOT DETECTED NOT DETECTED Final   Staphylococcus epidermidis NOT DETECTED NOT DETECTED Final   Staphylococcus lugdunensis NOT DETECTED NOT DETECTED  Final   Streptococcus species NOT DETECTED NOT DETECTED Final   Streptococcus agalactiae  NOT DETECTED NOT DETECTED Final   Streptococcus pneumoniae NOT DETECTED NOT DETECTED Final   Streptococcus pyogenes NOT DETECTED NOT DETECTED Final   A.calcoaceticus-baumannii NOT DETECTED NOT DETECTED Final   Bacteroides fragilis NOT DETECTED NOT DETECTED Final   Enterobacterales DETECTED (A) NOT DETECTED Final    Comment: Enterobacterales represent a large order of gram negative bacteria, not a single organism. CRITICAL RESULT CALLED TO, READ BACK BY AND VERIFIED WITH: PHARMD N. GLOGOVAC [id number] @ 1915 FH    Enterobacter cloacae complex NOT DETECTED NOT DETECTED Final   Escherichia coli DETECTED (A) NOT DETECTED Final    Comment: CRITICAL RESULT CALLED TO, READ BACK BY AND VERIFIED WITH: PHARMD NCarin Hock [id number] @ 1915 FH    Klebsiella aerogenes NOT DETECTED NOT DETECTED Final   Klebsiella oxytoca NOT DETECTED NOT DETECTED Final   Klebsiella pneumoniae NOT DETECTED NOT DETECTED Final   Proteus species NOT DETECTED NOT DETECTED Final   Salmonella species NOT DETECTED NOT DETECTED Final   Serratia marcescens NOT DETECTED NOT DETECTED Final   Haemophilus influenzae NOT DETECTED NOT DETECTED Final   Neisseria meningitidis NOT DETECTED NOT DETECTED Final   Pseudomonas aeruginosa NOT DETECTED NOT DETECTED Final   Stenotrophomonas maltophilia NOT DETECTED NOT DETECTED Final   Candida albicans NOT DETECTED NOT DETECTED Final   Candida auris NOT DETECTED NOT DETECTED Final   Candida glabrata NOT DETECTED NOT DETECTED Final   Candida krusei NOT DETECTED NOT DETECTED Final   Candida parapsilosis NOT DETECTED NOT DETECTED Final   Candida tropicalis NOT DETECTED NOT DETECTED Final   Cryptococcus neoformans/gattii NOT DETECTED NOT DETECTED Final   CTX-M ESBL DETECTED (A) NOT DETECTED Final    Comment: CRITICAL RESULT CALLED TO, READ BACK BY AND VERIFIED WITH: PHARMD NCarin Hock [id number] @ 1915  FH (NOTE) Extended spectrum beta-lactamase detected. Recommend a carbapenem as initial therapy.      Carbapenem resistance IMP NOT DETECTED NOT DETECTED Final   Carbapenem resistance KPC NOT DETECTED NOT DETECTED Final   Carbapenem resistance NDM NOT DETECTED NOT DETECTED Final   Carbapenem resist OXA 48 LIKE NOT DETECTED NOT DETECTED Final   Carbapenem resistance VIM NOT DETECTED NOT DETECTED Final    Comment: Performed at [facility], [address], [place], [place] [address]  Blood culture (routine x 2)     Status: Abnormal   Collection Time: 12/28/22  5:37 AM   Specimen: BLOOD  Result Value Ref Range Status   Specimen Description   Final    BLOOD RIGHT ANTECUBITAL Performed at [facility], [address], [place], [place] [address]    Special Requests   Final    BOTTLES DRAWN AEROBIC AND ANAEROBIC Blood Culture adequate volume Performed at [facility], [address], [place], [place] [address]    Culture  Setup Time   Final    GRAM NEGATIVE RODS AEROBIC BOTTLE ONLY CRITICAL VALUE NOTED.  VALUE IS CONSISTENT WITH PREVIOUSLY REPORTED AND CALLED VALUE.    Culture (A)  Final    ESCHERICHIA COLI SUSCEPTIBILITIES PERFORMED ON PREVIOUS CULTURE WITHIN THE LAST 5 DAYS. Performed at [facility], [address] [place], [place] [address]    Report Status 12/30/2022 FINAL  Final     Medications:    enoxaparin (LOVENOX) injection  40 mg Subcutaneous Q24H   famotidine  20 mg Oral QHS   insulin aspart  0-5 Units Subcutaneous QHS   insulin aspart  0-6 Units Subcutaneous TID WC  insulin aspart  2 Units Subcutaneous TID WC   magnesium oxide  800 mg Oral Daily   mycophenolate  180 mg Oral BID   predniSONE  5 mg Oral Q breakfast   sodium bicarbonate  2,600 mg Oral TID   tacrolimus  7 mg Oral Daily   And   tacrolimus  6 mg Oral QHS   Continuous Infusions:  meropenem (MERREM) IV 1 g (01/01/23 0541)      LOS: 4 days    Marinda Elk  [facility]  01/01/2023, 8:24 AM

## 2023-01-01 NOTE — Progress Notes (Addendum)
PHARMACY CONSULT NOTE FOR:  OUTPATIENT  PARENTERAL ANTIBIOTIC THERAPY (OPAT)  Indication: ESBL E.coli urosepsis/pyelo Regimen: Ertapenem 1g IV every 24 hours End date: 01/11/23  IV antibiotic discharge orders are pended. To discharging provider:  please sign these orders via discharge navigator,  Select New Orders & click on the button choice - Manage This Unsigned Work.    Thank you for allowing pharmacy to be a part of this patient's care.  Georgina Pillion, PharmD, BCPS Infectious Diseases Clinical Pharmacist 01/01/2023 10:55 AM   **Pharmacist phone directory can now be found on [email or web address] ([email or web address]).  Listed under [facility].

## 2023-01-02 DIAGNOSIS — N12 Tubulo-interstitial nephritis, not specified as acute or chronic: Secondary | ICD-10-CM

## 2023-01-02 DIAGNOSIS — T8619 Other complication of kidney transplant: Secondary | ICD-10-CM

## 2023-01-02 LAB — CBC
HCT: 38.8 % (ref 36.0–46.0)
Hemoglobin: 12.8 g/dL (ref 12.0–15.0)
MCH: 27.8 pg (ref 26.0–34.0)
MCHC: 33 g/dL (ref 30.0–36.0)
MCV: 84.2 fL (ref 80.0–100.0)
Platelets: 217 10*3/uL (ref 150–400)
RBC: 4.61 MIL/uL (ref 3.87–5.11)
RDW: 13.7 % (ref 11.5–15.5)
WBC: 7.4 10*3/uL (ref 4.0–10.5)
nRBC: 0 % (ref 0.0–0.2)

## 2023-01-02 LAB — GLUCOSE, CAPILLARY: Glucose-Capillary: 112 mg/dL — ABNORMAL HIGH (ref 70–99)

## 2023-01-02 MED ORDER — HEPARIN SOD (PORK) LOCK FLUSH 100 UNIT/ML IV SOLN
250.0000 [IU] | INTRAVENOUS | Status: AC | PRN
  Administered 2023-01-02: 250 [IU]

## 2023-01-02 MED ORDER — ERTAPENEM IV (FOR PTA / DISCHARGE USE ONLY)
1.0000 g | INTRAVENOUS | 0 refills | Status: AC
Start: 2023-01-02 — End: 2023-01-12

## 2023-01-02 NOTE — TOC Transition Note (Signed)
Transition of Care [facility]) - CM/SW Discharge Note   Patient Details  Name: Katie Lyons MRN: [id number] Date of Birth: [birth year]  Transition of Care [facility]) CM/SW Contact:  Amada Jupiter, LCSW Phone Number: 01/02/2023, 12:01 PM   Clinical Narrative:    Pt medically cleared for dc home today.  Referrals secured for IV abx via [facility] and HHRN to be provided via [facility].  Education completed with pt/ family.  No further TOC needs.   Final next level of care: Home w Home Health Services Barriers to Discharge: Barriers Resolved   Patient Goals and CMS Choice   Choice offered to / list presented to : NA  Discharge Placement                         Discharge Plan and Services Additional resources added to the After Visit Summary for     Discharge Planning Services: CM Consult Post Acute Care Choice: NA            DME Agency: NA       HH Arranged: RN, IV Antibiotics HH Agency: [facility] Date HH Agency Contacted: 01/01/23   Representative spoke with at [facility] Agency: [facility] - Jeri Modena, RN;    [facility] to provide [facility]  Social Determinants of Health (SDOH) Interventions SDOH Screenings   Tobacco Use: Low Risk  (12/28/2022)     Readmission Risk Interventions    01/01/2023    1:11 PM  Readmission Risk Prevention Plan  Post Dischage Appt Complete  Medication Screening Complete  Transportation Screening Complete

## 2023-01-02 NOTE — Discharge Summary (Signed)
Physician Discharge Summary  Katie Lyons UJW:[id number] DOB: 08/28/1985 DOA: 12/28/2022  PCP: Jodi Marble, MD  Admit date: 12/28/2022 Discharge date: 01/02/2023  Admitted From: Home Disposition:  Home  Recommendations for Outpatient Follow-up:  Follow up with infectious disease in 1-2 weeks Please obtain BMP/CBC in one week Follow-up with nephrology as an outpatient.  Home Health:[facility] Equipment/Devices:None  Discharge Condition:Stable CODE STATUS:Full Diet recommendation: Heart Healthy  Brief/Interim Summary: 37 y.o. female past medical history significant for anxiety, chronic ischemic cardiomyopathy, essential hypertension, traumatic intracerebral hemorrhage with brain injury, end-stage renal disease with a history of kidney transplant and bilobar kidney transplant comes into the ED complaining of fever body aches and fatigue along with dysuria was found to have acute kidney injury possibly pyelonephritis, CT scan of the abdomen pelvis showed inflamed right renal transpla   Discharge Diagnoses:  Principal Problem:   Acute UTI (urinary tract infection) Active Problems:   HTN (hypertension)   GERD (gastroesophageal reflux disease)   Pyelonephritis of transplanted kidney   Hyponatremia   Transaminitis   Hyperglycemia  Sepsis due to acute kidney injury/pyonephritis transplant kidney:  Was started on IV antibiotics on admission immunosuppressive therapy was held for 24 hours on admission. Urine cultures and blood cultures grew ESBL E. coli. She remained on IV meropenem and infectious ease was consulted recommended a PICC line and 2 weeks of antibiotics with an end date on 01/11/2023. Follow-up with them as an outpatient.  Hypovolemic hyponatremia: Resolved with IV fluid cessation.  Hyperglycemia: With an A1c of 5.8. Blood glucose remained relatively well-controlled with minimal insulin required.  GERD:  Continue famotidine.  Mild  thrombocytopenia: Likely due to infectious etiology now resolved.  Discharge Instructions  Discharge Instructions     [facility] pharmacist to adjust dose for Vancomycin, Aminoglycosides and other anti-infective therapies as requested by physician.   Complete by: As directed    [facility] to provide Cath Flo 2mg    Complete by: As directed    Administer for PICC line occlusion and as ordered by physician for other access device issues.   Anaphylaxis Kit: Provided to treat any anaphylactic reaction to the medication being provided to the patient if First Dose or when requested by physician   Complete by: As directed    Epinephrine 1mg /ml vial / amp: Administer 0.3mg  (0.71ml) subcutaneously once for moderate to severe anaphylaxis, nurse to call physician and pharmacy when reaction occurs and call 911 if needed for immediate care   Diphenhydramine 50mg /ml IV vial: Administer 25-50mg  IV/IM PRN for first dose reaction, rash, itching, mild reaction, nurse to call physician and pharmacy when reaction occurs   Sodium Chloride 0.9% NS IV: Administer if needed for hypovolemic blood pressure drop or as ordered by physician after call to physician with anaphylactic reaction   Change dressing on IV access line weekly and PRN   Complete by: As directed    Diet - low sodium heart healthy   Complete by: As directed    Flush IV access with Sodium Chloride 0.9% and Heparin 10 units/ml or 100 units/ml   Complete by: As directed    Home infusion instructions - [facility]   Complete by: As directed    Instructions: Flush IV access with Sodium Chloride 0.9% and Heparin 10units/ml or 100units/ml   Change dressing on IV access line: Weekly and PRN   Instructions Cath Flo 2mg : Administer for PICC Line occlusion and as ordered by physician for other access device   [facility] pharmacist to adjust dose for: Vancomycin, Aminoglycosides and other anti-infective  therapies as requested by physician   Increase activity slowly   Complete by: As directed    Method of administration may be changed at the discretion of home infusion pharmacist based upon assessment of the patient and/or caregiver's ability to self-administer the medication ordered   Complete by: As directed       Allergies as of 01/02/2023   No Known Allergies      Medication List     STOP taking these medications    cefdinir 300 MG capsule Commonly known as: OMNICEF       TAKE these medications    acetaminophen 500 MG tablet Commonly known as: TYLENOL Take 500 mg by mouth every 6 (six) hours as needed for mild pain or headache.   benzonatate 100 MG capsule Commonly known as: TESSALON Take 1 capsule (100 mg total) by mouth every 8 (eight) hours.   ertapenem  IVPB Commonly known as: INVANZ Inject 1 g into the vein daily for 10 days. Indication:  ESBL E.coli urosepsis/pyelo First Dose: Yes Last Day of Therapy:  01/11/23 Labs - Once weekly:  CBC/D and BMP Method of administration: Mini-Bag Plus / Gravity Pull PICC/midline at the completion of IV antibiotics Method of administration may be changed at the discretion of home infusion pharmacist based upon assessment of the patient and/or caregiver's ability to self-administer the medication ordered.   magnesium oxide 400 MG tablet Commonly known as: MAG-OX Take 800 mg by mouth daily.   methenamine 1 g tablet Commonly known as: HIPREX Take 1 g by mouth daily.   mycophenolate 180 MG EC tablet Commonly known as: MYFORTIC Take 180 mg by mouth 2 (two) times daily.   oseltamivir 75 MG capsule Commonly known as: TAMIFLU Take 1 capsule (75 mg total) by mouth every 12 (twelve) hours.   predniSONE 5 MG tablet Commonly known as: DELTASONE Take 5 mg by mouth daily with breakfast.   sodium bicarbonate 650 MG tablet Take 2,600 mg by mouth 3 (three) times daily. Four tablets three times a day   tacrolimus 1 MG  capsule Commonly known as: PROGRAF Take 6-7 mg by mouth See admin instructions. Take 7 tablets in the AM and 6 tablets in the PM.               Discharge Care Instructions  (From admission, onward)           Start     Ordered   01/02/23 0000  Change dressing on IV access line weekly and PRN  (Home infusion instructions - [facility] )        01/02/23 0753            Follow-up Information     [facility], [facility]. Go on 01/15/2023.   Why: Appointment at 4PM with NP for Dr. Reina Fuse information: [address] [place] [address] [phone]                No Known Allergies  Consultations: Infectious disease Nephrology.   Procedures/Studies: [place] EKG SITE RITE  Result Date: 01/01/2023 If Site Rite image not attached, placement could not be confirmed due to current cardiac rhythm.  CT Renal Stone Study  Result Date: 12/28/2022 CLINICAL DATA:  37 year old female with Fever and painful urination. Negative urine pregnancy test. History of native renal atrophy, right lower quadrant transplant. EXAM: CT ABDOMEN AND PELVIS WITHOUT CONTRAST TECHNIQUE: Multidetector CT imaging of  the abdomen and pelvis was performed following the standard protocol without IV contrast. RADIATION DOSE REDUCTION: This exam was performed according to the departmental dose-optimization program which includes automated exposure control, adjustment of the mA and/or kV according to patient size and/or use of iterative reconstruction technique. COMPARISON:  CT Abdomen and Pelvis 10/11/2021 FINDINGS: Lower chest: Borderline to mild cardiomegaly. No pericardial effusion. Symmetric lung base atelectasis. No pleural effusion. Small calcified granuloma right costophrenic angle. Hepatobiliary: Negative noncontrast liver and gallbladder. Pancreas: Negative. Spleen: Negative. Adrenals/Urinary Tract: Normal adrenal glands. Chronic native renal atrophy. Chronic right lower quadrant  transplant. Moderate pararenal inflammatory stranding, especially dependent between the transplant and cecum (coronal image 61). See also series 2, image 55. No convincing hydronephrosis. No transplant nephrolithiasis. No transplant hydroureter. Chronic pelvic phleboliths, occasional surgical clips. Unremarkable noncontrast urinary bladder. Stomach/Bowel: No dilated large or small bowel. Right lower quadrant inflammation appears more related to the renal transplant than the cecum or distal small bowel. Normal appendix terminates on coronal image 89. Decompressed terminal ileum. Negative noncontrast stomach and duodenum. No free air or free fluid. No convincing bowel inflammation. Vascular/Lymphatic: Normal caliber abdominal aorta. Aortoiliac calcified atherosclerosis. Vascular patency is not evaluated in the absence of IV contrast. No lymphadenopathy identified. Reproductive: Negative noncontrast appearance. Other: No definite pelvis free fluid. Musculoskeletal: Chronic L5 pars fractures. Subtle L5-S1 spondylolisthesis is stable. Probable mild generalized renal osteodystrophy. No acute osseous abnormality identified. IMPRESSION: 1. Right lower quadrant renal transplant appears inflamed but is not obviously obstructed. Consider Transplant Pyelonephritis, ascending urinary infection, versus other acute intrinsic renal disease. 2. No other acute or inflammatory process identified in the noncontrast abdomen or pelvis. Normal appendix.  Chronic L5 pars fractures. Aortic Atherosclerosis (ICD10-I70.0). Electronically Signed   By: Odessa Fleming M.D.   On: 12/28/2022 07:41   DG Chest 2 View  Result Date: 12/28/2022 CLINICAL DATA:  37 year old female with history of sepsis. Renal transplant. Fever and dysuria. EXAM: CHEST - 2 VIEW COMPARISON:  Chest x-ray 08/22/2022. FINDINGS: Lung volumes are normal. No consolidative airspace disease. No pleural effusions. No pneumothorax. No pulmonary nodule or mass noted. Pulmonary  vasculature and the cardiomediastinal silhouette are within normal limits. IMPRESSION: No radiographic evidence of acute cardiopulmonary disease. Electronically Signed   By: Trudie Reed M.D.   On: 12/28/2022 06:50   (Echo, Carotid, EGD, Colonoscopy, ERCP)    Subjective: No complaints  Discharge Exam: Vitals:   01/01/23 2029 01/02/23 0600  BP: (!) 165/77 (!) 151/66  Pulse: 63 67  Resp: 18 18  Temp: 97.8 F (36.6 C) 98.1 F (36.7 C)  SpO2: 100% 100%   Vitals:   01/01/23 0526 01/01/23 1355 01/01/23 2029 01/02/23 0600  BP: 131/75 119/78 (!) 165/77 (!) 151/66  Pulse: 61 (!) 58 63 67  Resp: 17 18 18 18   Temp: 98.3 F (36.8 C) 97.8 F (36.6 C) 97.8 F (36.6 C) 98.1 F (36.7 C)  TempSrc:  Oral Oral Oral  SpO2: 98% 100% 100% 100%  Weight:      Height:        General: Pt is alert, awake, not in acute distress Cardiovascular: RRR, S1/S2 +, no rubs, no gallops Respiratory: CTA bilaterally, no wheezing, no rhonchi Abdominal: Soft, NT, ND, bowel sounds + Extremities: no edema, no cyanosis    The results of significant diagnostics from this hospitalization (including imaging, microbiology, ancillary and laboratory) are listed below for reference.     Microbiology: Recent Results (from the past 240 hour(s))  Urine Culture (for pregnant, neutropenic  or urologic patients or patients with an indwelling urinary catheter)     Status: Abnormal   Collection Time: 12/28/22  4:39 AM   Specimen: Urine, Clean Catch  Result Value Ref Range Status   Specimen Description   Final    URINE, CLEAN CATCH Performed at [facility], [address] [place], [place] [address]    Special Requests   Final    NONE Performed at [facility], [address], [place], [place] [address]    Culture (A)  Final    >=100,000 COLONIES/mL ESCHERICHIA COLI Confirmed Extended Spectrum Beta-Lactamase [name] (ESBL).  In bloodstream infections from ESBL organisms,  carbapenems are preferred over piperacillin/tazobactam. They are shown to have a lower risk of mortality.    Report Status 12/30/2022 FINAL  Final   Organism ID, Bacteria ESCHERICHIA COLI (A)  Final      Susceptibility   Escherichia coli - MIC*    AMPICILLIN >=32 RESISTANT Resistant     CEFAZOLIN >=64 RESISTANT Resistant     CEFEPIME >=32 RESISTANT Resistant     CEFTRIAXONE >=64 RESISTANT Resistant     CIPROFLOXACIN >=4 RESISTANT Resistant     GENTAMICIN <=1 SENSITIVE Sensitive     IMIPENEM <=0.25 SENSITIVE Sensitive     NITROFURANTOIN <=16 SENSITIVE Sensitive     TRIMETH/SULFA <=20 SENSITIVE Sensitive     AMPICILLIN/SULBACTAM >=32 RESISTANT Resistant     PIP/TAZO <=4 SENSITIVE Sensitive     * >=100,000 COLONIES/mL ESCHERICHIA COLI  Blood culture (routine x 2)     Status: Abnormal   Collection Time: 12/28/22  5:00 AM   Specimen: BLOOD RIGHT HAND  Result Value Ref Range Status   Specimen Description   Final    BLOOD RIGHT HAND Performed at [facility], [address], [place], [place] [address]    Special Requests   Final    BOTTLES DRAWN AEROBIC AND ANAEROBIC Blood Culture adequate volume Performed at [facility], [address], [place], [place] [address]    Culture  Setup Time   Final    GRAM NEGATIVE RODS ANAEROBIC BOTTLE ONLY CRITICAL RESULT CALLED TO, READ BACK BY AND VERIFIED WITH: Abe People [id number] @ [phone] FH Performed at [facility], [address], [place], [place] [address]    Culture (A)  Final    ESCHERICHIA COLI Confirmed Extended Spectrum Beta-Lactamase [name] (ESBL).  In bloodstream infections from ESBL organisms, carbapenems are preferred over piperacillin/tazobactam. They are shown to have a lower risk of mortality.    Report Status 12/30/2022 FINAL  Final   Organism ID, Bacteria ESCHERICHIA COLI  Final      Susceptibility   Escherichia coli - MIC*    AMPICILLIN >=32 RESISTANT Resistant     CEFEPIME  16 RESISTANT Resistant     CEFTAZIDIME RESISTANT Resistant     CEFTRIAXONE >=64 RESISTANT Resistant     CIPROFLOXACIN >=4 RESISTANT Resistant     GENTAMICIN <=1 SENSITIVE Sensitive     IMIPENEM <=0.25 SENSITIVE Sensitive     TRIMETH/SULFA <=20 SENSITIVE Sensitive     AMPICILLIN/SULBACTAM >=32 RESISTANT Resistant     PIP/TAZO 8 SENSITIVE Sensitive     * ESCHERICHIA COLI  Blood Culture ID Panel (Reflexed)     Status: Abnormal   Collection Time: 12/28/22  5:00 AM  Result Value Ref Range Status   Enterococcus faecalis NOT DETECTED NOT DETECTED Final   Enterococcus Faecium NOT DETECTED NOT DETECTED Final   Listeria  monocytogenes NOT DETECTED NOT DETECTED Final   Staphylococcus species NOT DETECTED NOT DETECTED Final   Staphylococcus aureus (BCID) NOT DETECTED NOT DETECTED Final   Staphylococcus epidermidis NOT DETECTED NOT DETECTED Final   Staphylococcus lugdunensis NOT DETECTED NOT DETECTED Final   Streptococcus species NOT DETECTED NOT DETECTED Final   Streptococcus agalactiae NOT DETECTED NOT DETECTED Final   Streptococcus pneumoniae NOT DETECTED NOT DETECTED Final   Streptococcus pyogenes NOT DETECTED NOT DETECTED Final   A.calcoaceticus-baumannii NOT DETECTED NOT DETECTED Final   Bacteroides fragilis NOT DETECTED NOT DETECTED Final   Enterobacterales DETECTED (A) NOT DETECTED Final    Comment: Enterobacterales represent a large order of gram negative bacteria, not a single organism. CRITICAL RESULT CALLED TO, READ BACK BY AND VERIFIED WITH: PHARMD N. GLOGOVAC [id number] @ 1915 FH    Enterobacter cloacae complex NOT DETECTED NOT DETECTED Final   Escherichia coli DETECTED (A) NOT DETECTED Final    Comment: CRITICAL RESULT CALLED TO, READ BACK BY AND VERIFIED WITH: PHARMD NCarin Hock [id number] @ 1915 FH    Klebsiella aerogenes NOT DETECTED NOT DETECTED Final   Klebsiella oxytoca NOT DETECTED NOT DETECTED Final   Klebsiella pneumoniae NOT DETECTED NOT DETECTED Final   Proteus species NOT  DETECTED NOT DETECTED Final   Salmonella species NOT DETECTED NOT DETECTED Final   Serratia marcescens NOT DETECTED NOT DETECTED Final   Haemophilus influenzae NOT DETECTED NOT DETECTED Final   Neisseria meningitidis NOT DETECTED NOT DETECTED Final   Pseudomonas aeruginosa NOT DETECTED NOT DETECTED Final   Stenotrophomonas maltophilia NOT DETECTED NOT DETECTED Final   Candida albicans NOT DETECTED NOT DETECTED Final   Candida auris NOT DETECTED NOT DETECTED Final   Candida glabrata NOT DETECTED NOT DETECTED Final   Candida krusei NOT DETECTED NOT DETECTED Final   Candida parapsilosis NOT DETECTED NOT DETECTED Final   Candida tropicalis NOT DETECTED NOT DETECTED Final   Cryptococcus neoformans/gattii NOT DETECTED NOT DETECTED Final   CTX-M ESBL DETECTED (A) NOT DETECTED Final    Comment: CRITICAL RESULT CALLED TO, READ BACK BY AND VERIFIED WITH: PHARMD NCarin Hock [id number] @ 1915 FH (NOTE) Extended spectrum beta-lactamase detected. Recommend a carbapenem as initial therapy.      Carbapenem resistance IMP NOT DETECTED NOT DETECTED Final   Carbapenem resistance KPC NOT DETECTED NOT DETECTED Final   Carbapenem resistance NDM NOT DETECTED NOT DETECTED Final   Carbapenem resist OXA 48 LIKE NOT DETECTED NOT DETECTED Final   Carbapenem resistance VIM NOT DETECTED NOT DETECTED Final    Comment: Performed at [facility], [address], [place], [place] [address]  Blood culture (routine x 2)     Status: Abnormal   Collection Time: 12/28/22  5:37 AM   Specimen: BLOOD  Result Value Ref Range Status   Specimen Description   Final    BLOOD RIGHT ANTECUBITAL Performed at [facility], [address], [place], [place] [address]    Special Requests   Final    BOTTLES DRAWN AEROBIC AND ANAEROBIC Blood Culture adequate volume Performed at [facility], [address], [place], [place] [address]    Culture  Setup Time   Final    GRAM NEGATIVE  RODS AEROBIC BOTTLE ONLY CRITICAL VALUE NOTED.  VALUE IS CONSISTENT WITH PREVIOUSLY REPORTED AND CALLED VALUE.    Culture (A)  Final    ESCHERICHIA COLI SUSCEPTIBILITIES PERFORMED ON PREVIOUS CULTURE WITHIN THE LAST 5 DAYS. Performed at [facility], [address], [place], [place] [address]    Report Status 12/30/2022 FINAL  Final     Labs: BNP (last 3 results) No results for input(s): "BNP" in the last 8760 hours. Basic Metabolic Panel: Recent Labs  Lab 12/28/22 0520 12/29/22 0441 12/30/22 0425 12/30/22 0859 12/31/22 0436 01/01/23 0429  NA 128* 133* 132*  --  135 134*  K 3.5 3.6 3.6  --  3.8 4.0  CL 103 107 103  --  107 104  CO2 19* 19* 22  --  21* 22  GLUCOSE 160* 120* 182*  --  105* 136*  BUN 20 11 12   --  12 15  CREATININE 0.93 0.94 0.86  --  0.78 0.69  CALCIUM 8.8* 8.6* 8.7*  --  8.9 9.5  MG  --  1.5*  --  1.5*  --   --    Liver Function Tests: Recent Labs  Lab 12/28/22 0520 12/29/22 0441  AST 70* 35  ALT 48* 52*  ALKPHOS 107 101  BILITOT 0.9 0.8  PROT 7.3 6.6  ALBUMIN 3.7 3.1*   Recent Labs  Lab 12/28/22 0520  LIPASE 26   No results for input(s): "AMMONIA" in the last 168 hours. CBC: Recent Labs  Lab 12/28/22 0520 12/29/22 0441 12/30/22 0425 01/02/23 0056  WBC 14.2* 14.2* 11.3* 7.4  NEUTROABS 12.9*  --  8.1*  --   HGB 13.3 12.7 12.7 12.8  HCT 40.8 39.7 38.7 38.8  MCV 84.1 85.9 85.1 84.2  PLT 186 144* 151 217   Cardiac Enzymes: No results for input(s): "CKTOTAL", "CKMB", "CKMBINDEX", "TROPONINI" in the last 168 hours. BNP: Invalid input(s): "POCBNP" CBG: Recent Labs  Lab 12/31/22 2147 01/01/23 0740 01/01/23 1127 01/01/23 1624 01/01/23 2133  GLUCAP 148* 119* 147* 221* 136*   D-Dimer No results for input(s): "DDIMER" in the last 72 hours. Hgb A1c Recent Labs    12/30/22 0859  HGBA1C 5.8*   Lipid Profile No results for input(s): "CHOL", "HDL", "LDLCALC", "TRIG", "CHOLHDL", "LDLDIRECT" in the last 72 hours. Thyroid  function studies No results for input(s): "TSH", "T4TOTAL", "T3FREE", "THYROIDAB" in the last 72 hours.  Invalid input(s): "FREET3" Anemia work up No results for input(s): "VITAMINB12", "FOLATE", "FERRITIN", "TIBC", "IRON", "RETICCTPCT" in the last 72 hours. Urinalysis    Component Value Date/Time   COLORURINE YELLOW 12/28/2022 0439   APPEARANCEUR CLOUDY (A) 12/28/2022 0439   LABSPEC 1.019 12/28/2022 0439   PHURINE 7.0 12/28/2022 0439   GLUCOSEU NEGATIVE 12/28/2022 0439   HGBUR LARGE (A) 12/28/2022 0439   BILIRUBINUR NEGATIVE 12/28/2022 0439   KETONESUR NEGATIVE 12/28/2022 0439   PROTEINUR 30 (A) 12/28/2022 0439   UROBILINOGEN 0.2 12/09/2013 1556   NITRITE POSITIVE (A) 12/28/2022 0439   LEUKOCYTESUR LARGE (A) 12/28/2022 0439   Sepsis Labs Recent Labs  Lab 12/28/22 0520 12/29/22 0441 12/30/22 0425 01/02/23 0056  WBC 14.2* 14.2* 11.3* 7.4   Microbiology Recent Results (from the past 240 hour(s))  Urine Culture (for pregnant, neutropenic or urologic patients or patients with an indwelling urinary catheter)     Status: Abnormal   Collection Time: 12/28/22  4:39 AM   Specimen: Urine, Clean Catch  Result Value Ref Range Status   Specimen Description   Final    URINE, CLEAN CATCH Performed at [facility], [address] [place], [place] [address]    Special Requests   Final    NONE Performed at [facility], [address] [place], [place] [address]    Culture (A)  Final    >=100,000 COLONIES/mL ESCHERICHIA COLI Confirmed Extended Spectrum Beta-Lactamase [name] (ESBL).  In bloodstream infections from ESBL organisms, carbapenems are preferred over piperacillin/tazobactam. They are shown to have a lower risk of mortality.    Report Status 12/30/2022 FINAL  Final   Organism ID, Bacteria ESCHERICHIA COLI (A)  Final      Susceptibility   Escherichia coli - MIC*    AMPICILLIN >=32 RESISTANT Resistant     CEFAZOLIN >=64 RESISTANT  Resistant     CEFEPIME >=32 RESISTANT Resistant     CEFTRIAXONE >=64 RESISTANT Resistant     CIPROFLOXACIN >=4 RESISTANT Resistant     GENTAMICIN <=1 SENSITIVE Sensitive     IMIPENEM <=0.25 SENSITIVE Sensitive     NITROFURANTOIN <=16 SENSITIVE Sensitive     TRIMETH/SULFA <=20 SENSITIVE Sensitive     AMPICILLIN/SULBACTAM >=32 RESISTANT Resistant     PIP/TAZO <=4 SENSITIVE Sensitive     * >=100,000 COLONIES/mL ESCHERICHIA COLI  Blood culture (routine x 2)     Status: Abnormal   Collection Time: 12/28/22  5:00 AM   Specimen: BLOOD RIGHT HAND  Result Value Ref Range Status   Specimen Description   Final    BLOOD RIGHT HAND Performed at [facility], [address] [place], [place] [address]    Special Requests   Final    BOTTLES DRAWN AEROBIC AND ANAEROBIC Blood Culture adequate volume Performed at [facility], [address], [place], [place] [address]    Culture  Setup Time   Final    GRAM NEGATIVE RODS ANAEROBIC BOTTLE ONLY CRITICAL RESULT CALLED TO, READ BACK BY AND VERIFIED WITH: Abe People [id number] @ [phone] FH Performed at [facility], [address], [place], [place] [address]    Culture (A)  Final    ESCHERICHIA COLI Confirmed Extended Spectrum Beta-Lactamase [name] (ESBL).  In bloodstream infections from ESBL organisms, carbapenems are preferred over piperacillin/tazobactam. They are shown to have a lower risk of mortality.    Report Status 12/30/2022 FINAL  Final   Organism ID, Bacteria ESCHERICHIA COLI  Final      Susceptibility   Escherichia coli - MIC*    AMPICILLIN >=32 RESISTANT Resistant     CEFEPIME 16 RESISTANT Resistant     CEFTAZIDIME RESISTANT Resistant     CEFTRIAXONE >=64 RESISTANT Resistant     CIPROFLOXACIN >=4 RESISTANT Resistant     GENTAMICIN <=1 SENSITIVE Sensitive     IMIPENEM <=0.25 SENSITIVE Sensitive     TRIMETH/SULFA <=20 SENSITIVE Sensitive     AMPICILLIN/SULBACTAM >=32 RESISTANT Resistant      PIP/TAZO 8 SENSITIVE Sensitive     * ESCHERICHIA COLI  Blood Culture ID Panel (Reflexed)     Status: Abnormal   Collection Time: 12/28/22  5:00 AM  Result Value Ref Range Status   Enterococcus faecalis NOT DETECTED NOT DETECTED Final   Enterococcus Faecium NOT DETECTED NOT DETECTED Final   Listeria monocytogenes NOT DETECTED NOT DETECTED Final   Staphylococcus species NOT DETECTED NOT DETECTED Final   Staphylococcus aureus (BCID) NOT DETECTED NOT DETECTED Final   Staphylococcus epidermidis NOT DETECTED NOT DETECTED Final   Staphylococcus lugdunensis NOT DETECTED NOT DETECTED Final   Streptococcus species NOT DETECTED NOT DETECTED Final   Streptococcus agalactiae NOT DETECTED NOT DETECTED Final   Streptococcus pneumoniae NOT DETECTED NOT DETECTED Final   Streptococcus pyogenes NOT DETECTED NOT DETECTED Final   A.calcoaceticus-baumannii NOT DETECTED NOT DETECTED Final   Bacteroides fragilis NOT DETECTED  NOT DETECTED Final   Enterobacterales DETECTED (A) NOT DETECTED Final    Comment: Enterobacterales represent a large order of gram negative bacteria, not a single organism. CRITICAL RESULT CALLED TO, READ BACK BY AND VERIFIED WITH: PHARMD N. GLOGOVAC [id number] @ 1915 FH    Enterobacter cloacae complex NOT DETECTED NOT DETECTED Final   Escherichia coli DETECTED (A) NOT DETECTED Final    Comment: CRITICAL RESULT CALLED TO, READ BACK BY AND VERIFIED WITH: PHARMD NCarin Hock [id number] @ 1915 FH    Klebsiella aerogenes NOT DETECTED NOT DETECTED Final   Klebsiella oxytoca NOT DETECTED NOT DETECTED Final   Klebsiella pneumoniae NOT DETECTED NOT DETECTED Final   Proteus species NOT DETECTED NOT DETECTED Final   Salmonella species NOT DETECTED NOT DETECTED Final   Serratia marcescens NOT DETECTED NOT DETECTED Final   Haemophilus influenzae NOT DETECTED NOT DETECTED Final   Neisseria meningitidis NOT DETECTED NOT DETECTED Final   Pseudomonas aeruginosa NOT DETECTED NOT DETECTED Final    Stenotrophomonas maltophilia NOT DETECTED NOT DETECTED Final   Candida albicans NOT DETECTED NOT DETECTED Final   Candida auris NOT DETECTED NOT DETECTED Final   Candida glabrata NOT DETECTED NOT DETECTED Final   Candida krusei NOT DETECTED NOT DETECTED Final   Candida parapsilosis NOT DETECTED NOT DETECTED Final   Candida tropicalis NOT DETECTED NOT DETECTED Final   Cryptococcus neoformans/gattii NOT DETECTED NOT DETECTED Final   CTX-M ESBL DETECTED (A) NOT DETECTED Final    Comment: CRITICAL RESULT CALLED TO, READ BACK BY AND VERIFIED WITH: PHARMD NCarin Hock [id number] @ 1915 FH (NOTE) Extended spectrum beta-lactamase detected. Recommend a carbapenem as initial therapy.      Carbapenem resistance IMP NOT DETECTED NOT DETECTED Final   Carbapenem resistance KPC NOT DETECTED NOT DETECTED Final   Carbapenem resistance NDM NOT DETECTED NOT DETECTED Final   Carbapenem resist OXA 48 LIKE NOT DETECTED NOT DETECTED Final   Carbapenem resistance VIM NOT DETECTED NOT DETECTED Final    Comment: Performed at [facility], [address], [place], [place] [address]  Blood culture (routine x 2)     Status: Abnormal   Collection Time: 12/28/22  5:37 AM   Specimen: BLOOD  Result Value Ref Range Status   Specimen Description   Final    BLOOD RIGHT ANTECUBITAL Performed at [facility], [address], [place], [place] [address]    Special Requests   Final    BOTTLES DRAWN AEROBIC AND ANAEROBIC Blood Culture adequate volume Performed at [facility], [address], [place], [place] [address]    Culture  Setup Time   Final    GRAM NEGATIVE RODS AEROBIC BOTTLE ONLY CRITICAL VALUE NOTED.  VALUE IS CONSISTENT WITH PREVIOUSLY REPORTED AND CALLED VALUE.    Culture (A)  Final    ESCHERICHIA COLI SUSCEPTIBILITIES PERFORMED ON PREVIOUS CULTURE WITHIN THE LAST 5 DAYS. Performed at [facility], [address], [place], [place] [address]    Report  Status 12/30/2022 FINAL  Final    SIGNED:   Marinda Elk, MD  [facility] 01/02/2023, 7:54 AM Pager   If 7PM-7AM, please contact night-coverage [email or web address] Password [email or web address]

## 2023-01-02 NOTE — Progress Notes (Signed)
Nurse reviewed discharge instructions with pt. Pt verbalized understanding of discharge instructions, follow up appointment and new medication.  Pt discharging with PICC line for home antibiotic therapy.  Prescription given to pt.

## 2023-01-05 LAB — TACROLIMUS LEVEL: Tacrolimus (FK506) - LabCorp: 14.4 ng/mL (ref 2.0–20.0)

## 2023-01-15 ENCOUNTER — Ambulatory Visit (INDEPENDENT_AMBULATORY_CARE_PROVIDER_SITE_OTHER): Payer: Self-pay | Admitting: Internal Medicine

## 2023-01-15 ENCOUNTER — Other Ambulatory Visit: Payer: Self-pay

## 2023-01-15 ENCOUNTER — Encounter: Payer: Self-pay | Admitting: Internal Medicine

## 2023-01-15 VITALS — BP 112/75 | HR 63 | Resp 16 | Ht 61.0 in | Wt 136.0 lb

## 2023-01-15 DIAGNOSIS — R7881 Bacteremia: Secondary | ICD-10-CM

## 2023-01-15 DIAGNOSIS — Z8619 Personal history of other infectious and parasitic diseases: Secondary | ICD-10-CM

## 2023-01-15 DIAGNOSIS — Z940 Kidney transplant status: Secondary | ICD-10-CM
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
## 2023-09-19 NOTE — Progress Notes (Signed)
 Serum Cr stable. Tacrolimus  level pending today.

## 2024-01-09 ENCOUNTER — Other Ambulatory Visit: Payer: Self-pay | Admitting: Vascular Surgery

## 2024-01-09 DIAGNOSIS — R202 Paresthesia of skin: Secondary | ICD-10-CM

## 2024-01-24 ENCOUNTER — Encounter: Payer: Self-pay | Admitting: Obstetrics and Gynecology

## 2024-01-24 ENCOUNTER — Ambulatory Visit: Admitting: Obstetrics and Gynecology

## 2024-01-24 ENCOUNTER — Other Ambulatory Visit (HOSPITAL_COMMUNITY)
Admission: RE | Admit: 2024-01-24 | Discharge: 2024-01-24 | Disposition: A | Discharge status: Home / Self Care | Source: Ambulatory Visit | Attending: Obstetrics and Gynecology | Admitting: Obstetrics and Gynecology

## 2024-01-24 ENCOUNTER — Other Ambulatory Visit: Payer: Self-pay

## 2024-01-24 VITALS — BP 120/80 | HR 67 | Wt 134.0 lb

## 2024-01-24 DIAGNOSIS — R87810 Cervical high risk human papillomavirus (HPV) DNA test positive: Secondary | ICD-10-CM

## 2024-01-24 DIAGNOSIS — R87610 Atypical squamous cells of undetermined significance on cytologic smear of cervix (ASC-US): Secondary | ICD-10-CM

## 2024-01-24 NOTE — Progress Notes (Signed)
    GYNECOLOGY OFFICE COLPOSCOPY PROCEDURE NOTE  38 y.o. [id number] here for colposcopy for ASCUS with POSITIVE high risk HPV pap smear on 11/07/2023. Discussed role for HPV in cervical dysplasia, need for surveillance. Reviewed abnormal results and recommendation for colposcopy today.   Patient gave informed written consent, time out was performed.  Placed in lithotomy position. Cervix viewed with speculum and colposcope after application of acetic acid.   Colposcopy adequate? Yes, entire transformation zone visualized  Slight acetowhite lesion(s) noted at 5 o'clock; corresponding biopsies obtained.  ECC specimen obtained. All specimens were labeled and sent to pathology.  Chaperone was present during entire procedure.  Patient was given post procedure instructions.  Will follow up pathology and manage accordingly; patient will be contacted with results and recommendations.  Routine preventative health maintenance measures emphasized.   Kiki Pelton, MD, FACOG Minimally Invasive Gynecologic Surgery  Obstetrics and Gynecology, [facility] for [facility], [facility] 01/24/2024

## 2024-01-25 ENCOUNTER — Ambulatory Visit: Payer: Self-pay | Admitting: Obstetrics and Gynecology

## 2024-01-25 LAB — SURGICAL PATHOLOGY

## 2024-01-28 NOTE — Telephone Encounter (Signed)
 Called Pt to go over non urgent test results using Spanish [address] Karyl Paget id# [id number], no answer and was not able to leave message. Pt has My Chart will send Pt message.

## 2024-01-28 NOTE — Telephone Encounter (Signed)
-----   Message from Kiki Pelton sent at 01/25/2024  3:13 PM EDT ----- Benign sample from within cervix, recommend pap in 1 year ----- Message ----- From: Interface, Lab In [facility] Sent: 01/25/2024   3:00 PM EDT To: Kiki Pelton, MD

## 2024-01-30 ENCOUNTER — Ambulatory Visit (INDEPENDENT_AMBULATORY_CARE_PROVIDER_SITE_OTHER): Admitting: Vascular Surgery

## 2024-01-30 ENCOUNTER — Ambulatory Visit (HOSPITAL_COMMUNITY)
Admission: RE | Admit: 2024-01-30 | Discharge: 2024-01-30 | Disposition: A | Discharge status: Home / Self Care | Source: Ambulatory Visit | Attending: Vascular Surgery | Admitting: Vascular Surgery

## 2024-01-30 ENCOUNTER — Other Ambulatory Visit: Payer: Self-pay

## 2024-01-30 ENCOUNTER — Encounter: Payer: Self-pay | Admitting: Vascular Surgery

## 2024-01-30 VITALS — BP 124/83 | HR 58 | Temp 98.0°F | Resp 20 | Ht 61.0 in | Wt 135.0 lb

## 2024-01-30 DIAGNOSIS — R202 Paresthesia of skin: Secondary | ICD-10-CM

## 2024-01-30 NOTE — H&P (View-Only) (Signed)
 Patient ID: Katie Lyons, female   DOB: 02/16/1986, 38 y.o.   MRN: [id number]  Reason for Consult: Follow-up   Referred by Charletta Cons, MD  Subjective:     HPI:  Katie Lyons is a 38 y.o. female previous history of end-stage renal disease and was on dialysis via thigh access with her most recent access being a left upper arm AV fistula that I performed nearly 8 years ago and has been functioning well.  She is now status post renal transplant for about 5 years.  For the past several months she has begun having pain and numbness in her left forearm extending into her left ring and small finger.  She does not have any tissue loss or ulceration.  She has been evaluated at [facility] and was declined for fistula ligation is now here for further discussion.  Patient's daughter serves as an interpreter at her request, she was offered professional interpreter services and she declined.  Past Medical History:  Diagnosis Date   Anxiety    Chronic ischemic heart disease    Depression    3-4 years ago per husband   End stage renal disease (HCC)    Headache    Hypertension    Iron  deficiency    Secondary hyperparathyroidism (HCC)    Traumatic brain injury (HCC)    History reviewed. No pertinent family history. Past Surgical History:  Procedure Laterality Date   ARTERIOVENOUS GRAFT PLACEMENT     AV FISTULA PLACEMENT Left 04/13/2016   Procedure: UPPER EXTREMITY LEFT  ARTERIOVENOUS (AV) FISTULA CREATION;  Surgeon: Adine Hoof, MD;  [facility] OR;  Service: Vascular;  Laterality: Left;   AVGG REMOVAL Right 04/10/2016   Procedure: REMOVAL OF Right Leg ARTERIOVENOUS GORETEX GRAFT (AVGG);  Surgeon: Adine Hoof, MD;  Location: [facility] OR;  Service: Vascular;  Laterality: Right;   INSERTION OF DIALYSIS CATHETER Right 04/10/2016   Procedure: insertion Dialysis catheter;  Surgeon: Adine Hoof, MD;  Location: [facility] OR;  Service: Vascular;   Laterality: Right;   INSERTION OF DIALYSIS CATHETER Right 04/13/2016   Procedure: INSERTION OF DIALYSIS CATHETER RIGHT INTERNAL JUGULAR;  Surgeon: Adine Hoof, MD;  Location: [facility] OR;  Service: Vascular;  Laterality: Right;   IR FLUORO GUIDE CV LINE RIGHT  03/11/2021   IR PERC TUN PERIT CATH WO PORT S&I /IMAG  07/21/2021   IR US  GUIDE VASC ACCESS RIGHT  03/11/2021   Kidney Transplant x 2 months ago     NO PAST SURGERIES     REVISION OF ARTERIOVENOUS GORETEX GRAFT Right 01/18/2016   Procedure: REVISION OF ARTERIOVENOUS GORETEX GRAFT Right leg;  Surgeon: Mayo Speck, MD;  Location: [facility] OR;  Service: Vascular;  Laterality: Right;    Short Social History:  Social History   Tobacco Use   Smoking status: Never   Smokeless tobacco: Never  Substance Use Topics   Alcohol use: No    No Known Allergies  Current Outpatient Medications  Medication Sig Dispense Refill   acetaminophen  (TYLENOL ) 500 MG tablet Take 500 mg by mouth every 6 (six) hours as needed for mild pain or headache.     mycophenolate  (MYFORTIC ) 180 MG EC tablet Take 180 mg by mouth 2 (two) times daily.     predniSONE  (DELTASONE ) 5 MG tablet Take 5 mg by mouth daily with breakfast.     sodium bicarbonate  650 MG tablet Take 2,600 mg by mouth 3 (three) times daily. Four tablets three times a day  3   tacrolimus  (PROGRAF ) 1 MG capsule Take 6-7 mg by mouth See admin instructions. Take 7 tablets in the AM and 6 tablets in the PM.     benzonatate  (TESSALON ) 100 MG capsule Take 1 capsule (100 mg total) by mouth every 8 (eight) hours. (Patient not taking: Reported on 01/24/2024) 21 capsule 0   magnesium  oxide (MAG-OX) 400 MG tablet Take 800 mg by mouth daily. (Patient not taking: Reported on 01/24/2024)     oseltamivir  (TAMIFLU ) 75 MG capsule Take 1 capsule (75 mg total) by mouth every 12 (twelve) hours. (Patient not taking: Reported on 01/24/2024) 10 capsule 0   No current facility-administered medications for this visit.     Review of Systems  Constitutional:  Constitutional negative. HENT: HENT negative.  Eyes: Eyes negative.  Cardiovascular: Cardiovascular negative.  GI: Gastrointestinal negative.  Musculoskeletal: Musculoskeletal negative.  Skin: Skin negative.  Neurological: Positive for numbness.  Hematologic: Hematologic/lymphatic negative.  Psychiatric: Psychiatric negative.        Objective:  Objective   Vitals:   01/30/24 0901  BP: 124/83  Pulse: (!) 58  Resp: 20  Temp: 98 F (36.7 C)  SpO2: 100%  Weight: 135 lb (61.2 kg)  Height: 5' 1 (1.549 m)   Body mass index is 25.51 kg/m.  Physical Exam HENT:     Nose: Nose normal.   Eyes:     Pupils: Pupils are equal, round, and reactive to light.    Cardiovascular:     Rate and Rhythm: Normal rate.     Comments: Palpable left radial pulse increases with compression of avf Pulmonary:     Effort: Pulmonary effort is normal.  Abdominal:     General: Abdomen is flat.   Musculoskeletal:     Comments: Very strong thrill left upper arm   Neurological:     Mental Status: She is alert.   Psychiatric:        Mood and Affect: Mood normal.         Data: Findings:  +-----------------+-------------+----------+---------+----------+----------  ----+  Upper Arm        Diameter (cm)Depth (cm)BranchingPSV (cm/s) Flow  Volume    Arteriovenous                                                  (ml/min)     Fistula                                                                     +-----------------+-------------+----------+---------+----------+----------  ----+  Distal Brachial                                     224         1245        Artery                                                                      +-----------------+-------------+----------+---------+----------+----------  ----+  AVF anastomosis                                      84                      +-----------------+-------------+----------+---------+----------+----------  ----+  just after            1.0        0.2                234                     anastomosis                                                                 +-----------------+-------------+----------+---------+----------+----------  ----+  outflow vein          2.1        0.2                 69                     distal upper arm                                                            +-----------------+-------------+----------+---------+----------+----------  ----+  outflow vein mid      1.6        0.3                101                     upper arm                                                                   +-----------------+-------------+----------+---------+----------+----------  ----+     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     +-----------------------+-------+--------+                        Left   Comments  +-----------------------+-------+--------+  Radial Ambient         44 cm/s          +-----------------------+-------+--------+  Radial AV Compression  82 cm/s          +-----------------------+-------+--------+  Ulnar Ambient          29 cm/s          +-----------------------+-------+--------+  Ulnar AV Compression   47 cm/s          +-----------------------+-------+--------+  Palmar arch ambient    71 cm/s          +-----------------------+-------+--------+  Palmar arch Compression33 cm/s          +-----------------------+-------+--------+  Summary:  Arteriovenous fistula is not within normal limits due to abnormal  arteriovenous  fisutla with aneurysmal dilatation noted.  The left radial, ulnar, and palmar arch PSV has increased with  compression.      Assessment/Plan:    38 year old female with history of stage renal with well-functioning AV fistula but she has had a  transplant now for 5 years.  She is having significant symptoms of the left hand and has ultrasound today which is consistent with steal.  She may also have ulnar nerve entrapment given that her symptoms are mostly located on her ring and small finger.  Thankfully she does not have any wounds.  I discussed with her the options being evaluation of ulnar nerve entrapment and continuing with her current symptoms versus ligation of the AV fistula.  Patient is very hopeful to have the fistula ligated and partially excised.  I discussed that in the future need for dialysis access would require new permanent access and she demonstrates good understanding.  We also discussed that this will not have a great cosmetic result given the amount of scarring she has in the left upper extremity and that also this may not fully relieve her symptoms given that I believe she has it underlying neuropathy as well.  All these questions were answered with the patient using her daughter as an interpreter and she demonstrates good understanding.     Adine Hoof MD [facility]

## 2024-01-30 NOTE — Progress Notes (Signed)
 Patient ID: Katie Lyons, female   DOB: 02/16/1986, 38 y.o.   MRN: [id number]  Reason for Consult: Follow-up   Referred by Charletta Cons, MD  Subjective:     HPI:  Katie Lyons is a 38 y.o. female previous history of end-stage renal disease and was on dialysis via thigh access with her most recent access being a left upper arm AV fistula that I performed nearly 8 years ago and has been functioning well.  She is now status post renal transplant for about 5 years.  For the past several months she has begun having pain and numbness in her left forearm extending into her left ring and small finger.  She does not have any tissue loss or ulceration.  She has been evaluated at [facility] and was declined for fistula ligation is now here for further discussion.  Patient's daughter serves as an interpreter at her request, she was offered professional interpreter services and she declined.  Past Medical History:  Diagnosis Date   Anxiety    Chronic ischemic heart disease    Depression    3-4 years ago per husband   End stage renal disease (HCC)    Headache    Hypertension    Iron  deficiency    Secondary hyperparathyroidism (HCC)    Traumatic brain injury (HCC)    History reviewed. No pertinent family history. Past Surgical History:  Procedure Laterality Date   ARTERIOVENOUS GRAFT PLACEMENT     AV FISTULA PLACEMENT Left 04/13/2016   Procedure: UPPER EXTREMITY LEFT  ARTERIOVENOUS (AV) FISTULA CREATION;  Surgeon: Adine Hoof, MD;  Location: [facility] OR;  Service: Vascular;  Laterality: Left;   AVGG REMOVAL Right 04/10/2016   Procedure: REMOVAL OF Right Leg ARTERIOVENOUS GORETEX GRAFT (AVGG);  Surgeon: Adine Hoof, MD;  Location: [facility] OR;  Service: Vascular;  Laterality: Right;   INSERTION OF DIALYSIS CATHETER Right 04/10/2016   Procedure: insertion Dialysis catheter;  Surgeon: Adine Hoof, MD;  Location: [facility] OR;  Service: Vascular;   Laterality: Right;   INSERTION OF DIALYSIS CATHETER Right 04/13/2016   Procedure: INSERTION OF DIALYSIS CATHETER RIGHT INTERNAL JUGULAR;  Surgeon: Adine Hoof, MD;  Location: [facility] OR;  Service: Vascular;  Laterality: Right;   IR FLUORO GUIDE CV LINE RIGHT  03/11/2021   IR PERC TUN PERIT CATH WO PORT S&I /IMAG  07/21/2021   IR US  GUIDE VASC ACCESS RIGHT  03/11/2021   Kidney Transplant x 2 months ago     NO PAST SURGERIES     REVISION OF ARTERIOVENOUS GORETEX GRAFT Right 01/18/2016   Procedure: REVISION OF ARTERIOVENOUS GORETEX GRAFT Right leg;  Surgeon: Mayo Speck, MD;  Location: [facility] OR;  Service: Vascular;  Laterality: Right;    Short Social History:  Social History   Tobacco Use   Smoking status: Never   Smokeless tobacco: Never  Substance Use Topics   Alcohol use: No    No Known Allergies  Current Outpatient Medications  Medication Sig Dispense Refill   acetaminophen  (TYLENOL ) 500 MG tablet Take 500 mg by mouth every 6 (six) hours as needed for mild pain or headache.     mycophenolate  (MYFORTIC ) 180 MG EC tablet Take 180 mg by mouth 2 (two) times daily.     predniSONE  (DELTASONE ) 5 MG tablet Take 5 mg by mouth daily with breakfast.     sodium bicarbonate  650 MG tablet Take 2,600 mg by mouth 3 (three) times daily. Four tablets three times a day  3   tacrolimus  (PROGRAF ) 1 MG capsule Take 6-7 mg by mouth See admin instructions. Take 7 tablets in the AM and 6 tablets in the PM.     benzonatate  (TESSALON ) 100 MG capsule Take 1 capsule (100 mg total) by mouth every 8 (eight) hours. (Patient not taking: Reported on 01/24/2024) 21 capsule 0   magnesium  oxide (MAG-OX) 400 MG tablet Take 800 mg by mouth daily. (Patient not taking: Reported on 01/24/2024)     oseltamivir  (TAMIFLU ) 75 MG capsule Take 1 capsule (75 mg total) by mouth every 12 (twelve) hours. (Patient not taking: Reported on 01/24/2024) 10 capsule 0   No current facility-administered medications for this visit.     Review of Systems  Constitutional:  Constitutional negative. HENT: HENT negative.  Eyes: Eyes negative.  Cardiovascular: Cardiovascular negative.  GI: Gastrointestinal negative.  Musculoskeletal: Musculoskeletal negative.  Skin: Skin negative.  Neurological: Positive for numbness.  Hematologic: Hematologic/lymphatic negative.  Psychiatric: Psychiatric negative.        Objective:  Objective   Vitals:   01/30/24 0901  BP: 124/83  Pulse: (!) 58  Resp: 20  Temp: 98 F (36.7 C)  SpO2: 100%  Weight: 135 lb (61.2 kg)  Height: 5' 1 (1.549 m)   Body mass index is 25.51 kg/m.  Physical Exam HENT:     Nose: Nose normal.   Eyes:     Pupils: Pupils are equal, round, and reactive to light.    Cardiovascular:     Rate and Rhythm: Normal rate.     Comments: Palpable left radial pulse increases with compression of avf Pulmonary:     Effort: Pulmonary effort is normal.  Abdominal:     General: Abdomen is flat.   Musculoskeletal:     Comments: Very strong thrill left upper arm   Neurological:     Mental Status: She is alert.   Psychiatric:        Mood and Affect: Mood normal.         Data: Findings:  +-----------------+-------------+----------+---------+----------+----------  ----+  Upper Arm        Diameter (cm)Depth (cm)BranchingPSV (cm/s) Flow  Volume    Arteriovenous                                                  (ml/min)     Fistula                                                                     +-----------------+-------------+----------+---------+----------+----------  ----+  Distal Brachial                                     224         1245        Artery                                                                      +-----------------+-------------+----------+---------+----------+----------  ----+  AVF anastomosis                                      84                      +-----------------+-------------+----------+---------+----------+----------  ----+  just after            1.0        0.2                234                     anastomosis                                                                 +-----------------+-------------+----------+---------+----------+----------  ----+  outflow vein          2.1        0.2                 69                     distal upper arm                                                            +-----------------+-------------+----------+---------+----------+----------  ----+  outflow vein mid      1.6        0.3                101                     upper arm                                                                   +-----------------+-------------+----------+---------+----------+----------  ----+     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     +-----------------------+-------+--------+                        Left   Comments  +-----------------------+-------+--------+  Radial Ambient         44 cm/s          +-----------------------+-------+--------+  Radial AV Compression  82 cm/s          +-----------------------+-------+--------+  Ulnar Ambient          29 cm/s          +-----------------------+-------+--------+  Ulnar AV Compression   47 cm/s          +-----------------------+-------+--------+  Palmar arch ambient    71 cm/s          +-----------------------+-------+--------+  Palmar arch Compression33 cm/s          +-----------------------+-------+--------+  Summary:  Arteriovenous fistula is not within normal limits due to abnormal  arteriovenous  fisutla with aneurysmal dilatation noted.  The left radial, ulnar, and palmar arch PSV has increased with  compression.      Assessment/Plan:    38 year old female with history of stage renal with well-functioning AV fistula but she has had a  transplant now for 5 years.  She is having significant symptoms of the left hand and has ultrasound today which is consistent with steal.  She may also have ulnar nerve entrapment given that her symptoms are mostly located on her ring and small finger.  Thankfully she does not have any wounds.  I discussed with her the options being evaluation of ulnar nerve entrapment and continuing with her current symptoms versus ligation of the AV fistula.  Patient is very hopeful to have the fistula ligated and partially excised.  I discussed that in the future need for dialysis access would require new permanent access and she demonstrates good understanding.  We also discussed that this will not have a great cosmetic result given the amount of scarring she has in the left upper extremity and that also this may not fully relieve her symptoms given that I believe she has it underlying neuropathy as well.  All these questions were answered with the patient using her daughter as an interpreter and she demonstrates good understanding.     Adine Hoof MD [facility]

## 2024-02-20 ENCOUNTER — Encounter (HOSPITAL_COMMUNITY): Payer: Self-pay | Admitting: Vascular Surgery

## 2024-02-20 ENCOUNTER — Other Ambulatory Visit: Payer: Self-pay

## 2024-02-20 ENCOUNTER — Telehealth: Payer: Self-pay

## 2024-02-20 NOTE — Telephone Encounter (Signed)
 Used [employer] to contact patient in re: to surgery scheduled for 7/11 with Dr. Sheree.  Arrival time is now 10:00am.

## 2024-02-20 NOTE — Progress Notes (Signed)
 PCP - Dr. Marca Mean Rilla Cardiologist - denies  PPM/ICD - denies   Chest x-ray - 12/28/22 EKG - DOS Stress Test - denies ECHO - 04/15/16 Cardiac Cath - denies  CPAP - denies  DM- denies  ASA/Blood Thinner Instructions: n/a   ERAS Protcol - no, NPO  COVID TEST- n/a  Anesthesia review: yes, kidney transplant  Patient verbally denies any shortness of breath, fever, cough and chest pain during phone call      Questions were answered. Patient verbalized understanding of instructions.

## 2024-02-20 NOTE — Pre-Procedure Instructions (Signed)
-------------    SDW INSTRUCTIONS given:  Your procedure is scheduled on 7/11.  Report to [facility] Main Entrance A at 07:50 A.M., and check in at the Admitting office.  Any questions or running late day of surgery: call [phone]    Remember:  Do not eat or drink after midnight the night before your surgery    Take these medicines the morning of surgery with A SIP OF WATER  myfortic , prednisone , prograf                        May take these medicines IF NEEDED: tylenol    As of today, STOP taking any Aspirin  (unless otherwise instructed by your surgeon) Aleve, Naproxen, Ibuprofen , Motrin , Advil , Goody's, BC's, all herbal medications, fish oil, and all vitamins.   Do NOT Smoke (Tobacco/Vaping) 24 hours prior to your procedure  If you use a CPAP at night, you may bring all equipment for your overnight stay.     You will be asked to remove any contacts, glasses, piercing's, hearing aid's, dentures/partials prior to surgery. Please bring cases for these items if needed.     Patients discharged the day of surgery will not be allowed to drive home, and someone needs to stay with them for 24 hours.  SURGICAL WAITING ROOM VISITATION Patients may have no more than 2 support people in the waiting area - these visitors may rotate.   Pre-op  nurse will coordinate an appropriate time for 1 ADULT support person, who may not rotate, to accompany patient in pre-op .  Children under the age of 49 must have an adult with them who is not the patient and must remain in the main waiting area with an adult.  If the patient needs to stay at the hospital during part of their recovery, the visitor guidelines for inpatient rooms apply.  Please refer to the [facility] website for the visitor guidelines for any additional information.   Special instructions:   [place]- Preparing For Surgery   Please follow these instructions carefully.   Shower the NIGHT BEFORE SURGERY and the MORNING OF  SURGERY with DIAL Soap.   Pat yourself dry with a CLEAN TOWEL.  Wear CLEAN PAJAMAS to bed the night before surgery  Place CLEAN SHEETS on your bed the night of your first shower and DO NOT SLEEP WITH PETS.   Additional instructions for the day of surgery: DO NOT APPLY any lotions, deodorants, cologne, or perfumes.   Do not wear jewelry or makeup Do not wear nail polish, gel polish, artificial nails, or any other type of covering on natural nails (fingers and toes) Do not bring valuables to the hospital. [facility] is not responsible for valuables/personal belongings. Put on clean/comfortable clothes.  Please brush your teeth.  Ask your nurse before applying any prescription medications to the skin.

## 2024-02-21 ENCOUNTER — Encounter (HOSPITAL_COMMUNITY): Payer: Self-pay | Admitting: Vascular Surgery

## 2024-02-21 NOTE — Progress Notes (Signed)
 Patient informed of arrival time change for procedure on 02/22/2024. Patient to arrive at 67. patient reminded to not eat or drink after midnight.
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
## 2024-02-21 NOTE — Anesthesia Preprocedure Evaluation (Addendum)
 Anesthesia Evaluation  Patient identified by MRN, date of birth, ID band Patient awake    Reviewed: Allergy & Precautions, NPO status , Patient's Chart, lab work & pertinent test results  History of Anesthesia Complications Negative for: history of anesthetic complications  Airway Mallampati: II  TM Distance: >3 FB Neck ROM: Full    Dental  (+) Missing,    Pulmonary neg pulmonary ROS   Pulmonary exam normal        Cardiovascular hypertension, +CHF  Normal cardiovascular exam  CV: Stress echo 08/28/17 ([facility]): STRESS ECHO  Normal left ventricular function and global wall motion with stress. There  was normal increase in global LV function post exercise. The estimated LV  ejection fraction is 65-70% with stress. Negative exercise echocardiography  for inducible ischemia at target heart rate.    Echo 08/28/17 ([facility]): SUMMARY  The left ventricular size is normal. There is mild concentric left  ventricular hypertrophy. Left ventricular systolic function is normal. LV  ejection fraction = 60-65%. The left ventricular wall motion is normal.  The right ventricle is normal in size and function.  The left [facility] is mildly to moderately dilated.  There is mild aortic regurgitation.  There is no pericardial effusion.  There is no comparison study available.     Neuro/Psych  Headaches  Anxiety Depression       GI/Hepatic Neg liver ROS,GERD  ,,  Endo/Other  negative endocrine ROS    Renal/GU Renal disease (s/p renal transplant)     Musculoskeletal negative musculoskeletal ROS (+)    Abdominal   Peds  Hematology negative hematology ROS (+)   Anesthesia Other Findings Day of surgery medications reviewed with patient.  Reproductive/Obstetrics negative OB ROS                              Anesthesia Physical Anesthesia Plan  ASA: 2  Anesthesia Plan: General   Post-op Pain Management:  Tylenol  PO (pre-op )*   Induction: Intravenous  PONV Risk Score and Plan: 3 and Treatment may vary due to age or medical condition, Ondansetron , Dexamethasone  and Midazolam   Airway Management Planned: LMA  Additional Equipment: None  Intra-op Plan:   Post-operative Plan: Extubation in OR  Informed Consent: I have reviewed the patients History and Physical, chart, labs and discussed the procedure including the risks, benefits and alternatives for the proposed anesthesia with the patient or authorized representative who has indicated his/her understanding and acceptance.     Dental advisory given and Interpreter used for interview  Plan Discussed with: CRNA  Anesthesia Plan Comments: (PAT note written 02/21/2024 by Allison Zelenak, PA-C.  )         Anesthesia Quick Evaluation

## 2024-02-22 ENCOUNTER — Ambulatory Visit (HOSPITAL_COMMUNITY): Payer: Self-pay | Admitting: Vascular Surgery

## 2024-02-22 ENCOUNTER — Encounter (HOSPITAL_COMMUNITY)
Admission: RE | Disposition: A | Discharge status: Home / Self Care | Payer: Self-pay | Source: Ambulatory Visit | Attending: Vascular Surgery

## 2024-02-22 ENCOUNTER — Ambulatory Visit (HOSPITAL_BASED_OUTPATIENT_CLINIC_OR_DEPARTMENT_OTHER): Payer: Self-pay | Admitting: Vascular Surgery

## 2024-02-22 ENCOUNTER — Other Ambulatory Visit: Payer: Self-pay

## 2024-02-22 ENCOUNTER — Other Ambulatory Visit (HOSPITAL_COMMUNITY): Payer: Self-pay

## 2024-02-22 ENCOUNTER — Encounter (HOSPITAL_COMMUNITY): Payer: Self-pay | Admitting: Vascular Surgery

## 2024-02-22 ENCOUNTER — Ambulatory Visit (HOSPITAL_COMMUNITY)
Admission: RE | Admit: 2024-02-22 | Discharge: 2024-02-22 | Disposition: A | Discharge status: Home / Self Care | Source: Ambulatory Visit | Attending: Vascular Surgery | Admitting: Vascular Surgery

## 2024-02-22 DIAGNOSIS — Z940 Kidney transplant status: Secondary | ICD-10-CM

## 2024-02-22 DIAGNOSIS — N186 End stage renal disease: Secondary | ICD-10-CM | POA: Insufficient documentation

## 2024-02-22 DIAGNOSIS — N2581 Secondary hyperparathyroidism of renal origin: Secondary | ICD-10-CM | POA: Diagnosis not present

## 2024-02-22 DIAGNOSIS — Z87448 Personal history of other diseases of urinary system: Secondary | ICD-10-CM | POA: Diagnosis not present

## 2024-02-22 DIAGNOSIS — I120 Hypertensive chronic kidney disease with stage 5 chronic kidney disease or end stage renal disease: Secondary | ICD-10-CM | POA: Insufficient documentation

## 2024-02-22 DIAGNOSIS — Z992 Dependence on renal dialysis: Secondary | ICD-10-CM | POA: Diagnosis not present

## 2024-02-22 DIAGNOSIS — R202 Paresthesia of skin: Secondary | ICD-10-CM

## 2024-02-22 DIAGNOSIS — I509 Heart failure, unspecified: Secondary | ICD-10-CM | POA: Diagnosis not present

## 2024-02-22 DIAGNOSIS — T82898A Other specified complication of vascular prosthetic devices, implants and grafts, initial encounter: Secondary | ICD-10-CM

## 2024-02-22 DIAGNOSIS — I132 Hypertensive heart and chronic kidney disease with heart failure and with stage 5 chronic kidney disease, or end stage renal disease: Secondary | ICD-10-CM | POA: Diagnosis not present

## 2024-02-22 HISTORY — PX: REMOVAL OF GRAFT: SHX6361

## 2024-02-22 HISTORY — PX: LIGATION OF COMPETING BRANCHES OF ARTERIOVENOUS FISTULA: SHX5949

## 2024-02-22 LAB — POCT I-STAT, CHEM 8
BUN: 24 mg/dL — ABNORMAL HIGH (ref 6–20)
Calcium, Ion: 1.24 mmol/L (ref 1.15–1.40)
Chloride: 110 mmol/L (ref 98–111)
Creatinine, Ser: 0.9 mg/dL (ref 0.44–1.00)
Glucose, Bld: 160 mg/dL — ABNORMAL HIGH (ref 70–99)
HCT: 39 % (ref 36.0–46.0)
Hemoglobin: 13.3 g/dL (ref 12.0–15.0)
Potassium: 4.2 mmol/L (ref 3.5–5.1)
Sodium: 137 mmol/L (ref 135–145)
TCO2: 21 mmol/L — ABNORMAL LOW (ref 22–32)

## 2024-02-22 LAB — POCT PREGNANCY, URINE: Preg Test, Ur: NEGATIVE

## 2024-02-22 SURGERY — REMOVAL, GRAFT
Anesthesia: General | Laterality: Left

## 2024-02-22 MED ORDER — ONDANSETRON HCL 4 MG/2ML IJ SOLN
INTRAMUSCULAR | Status: AC
  Filled 2024-02-22: qty 2

## 2024-02-22 MED ORDER — DEXAMETHASONE SODIUM PHOSPHATE 10 MG/ML IJ SOLN
INTRAMUSCULAR | Status: DC | PRN
  Administered 2024-02-22: 5 mg via INTRAVENOUS

## 2024-02-22 MED ORDER — HYDROMORPHONE HCL 1 MG/ML IJ SOLN
INTRAMUSCULAR | Status: DC | PRN
  Administered 2024-02-22 (×2): .25 mg via INTRAVENOUS

## 2024-02-22 MED ORDER — PHENYLEPHRINE 80 MCG/ML (10ML) SYRINGE FOR IV PUSH (FOR BLOOD PRESSURE SUPPORT)
PREFILLED_SYRINGE | INTRAVENOUS | Status: AC
  Filled 2024-02-22: qty 10

## 2024-02-22 MED ORDER — CHLORHEXIDINE GLUCONATE 4 % EX SOLN: 60.0000 mL | Freq: Once | CUTANEOUS | Status: DC

## 2024-02-22 MED ORDER — HYDROCODONE-ACETAMINOPHEN 5-325 MG PO TABS
1.0000 | ORAL_TABLET | Freq: Four times a day (QID) | ORAL | 0 refills | Status: AC | PRN
  Filled 2024-02-22: qty 15, 4d supply, fill #0

## 2024-02-22 MED ORDER — CHLORHEXIDINE GLUCONATE 0.12 % MT SOLN
15.0000 mL | Freq: Once | OROMUCOSAL | Status: AC
Start: 2024-02-22 — End: 2024-02-22
  Administered 2024-02-22: 15 mL via OROMUCOSAL
  Filled 2024-02-22: qty 15

## 2024-02-22 MED ORDER — HEPARIN 6000 UNIT IRRIGATION SOLUTION
Status: AC
  Filled 2024-02-22: qty 500

## 2024-02-22 MED ORDER — LIDOCAINE 2% (20 MG/ML) 5 ML SYRINGE
INTRAMUSCULAR | Status: DC | PRN
  Administered 2024-02-22: 60 mg via INTRAVENOUS

## 2024-02-22 MED ORDER — SODIUM CHLORIDE 0.9 % IV SOLN
INTRAVENOUS | Status: DC | PRN
Start: 2024-02-22 — End: 2024-02-22

## 2024-02-22 MED ORDER — PROPOFOL 10 MG/ML IV BOLUS
INTRAVENOUS | Status: AC
  Filled 2024-02-22: qty 20

## 2024-02-22 MED ORDER — PHENYLEPHRINE HCL-NACL 20-0.9 MG/250ML-% IV SOLN
INTRAVENOUS | Status: DC | PRN
  Administered 2024-02-22: 40 ug/min via INTRAVENOUS

## 2024-02-22 MED ORDER — DROPERIDOL 2.5 MG/ML IJ SOLN: 0.6250 mg | Freq: Once | INTRAMUSCULAR | Status: DC | PRN

## 2024-02-22 MED ORDER — CEFAZOLIN SODIUM-DEXTROSE 2-4 GM/100ML-% IV SOLN
2.0000 g | INTRAVENOUS | Status: AC
  Administered 2024-02-22: 2 g via INTRAVENOUS
  Filled 2024-02-22: qty 100

## 2024-02-22 MED ORDER — OXYCODONE HCL 5 MG/5ML PO SOLN: 5.0000 mg | Freq: Once | ORAL | Status: AC | PRN

## 2024-02-22 MED ORDER — ORAL CARE MOUTH RINSE: 15.0000 mL | Freq: Once | OROMUCOSAL | Status: AC

## 2024-02-22 MED ORDER — LIDOCAINE 2% (20 MG/ML) 5 ML SYRINGE
INTRAMUSCULAR | Status: AC
  Filled 2024-02-22: qty 5

## 2024-02-22 MED ORDER — OXYCODONE HCL 5 MG PO TABS
5.0000 mg | ORAL_TABLET | Freq: Once | ORAL | Status: AC | PRN
  Administered 2024-02-22: 5 mg via ORAL

## 2024-02-22 MED ORDER — DEXAMETHASONE SODIUM PHOSPHATE 10 MG/ML IJ SOLN
INTRAMUSCULAR | Status: AC
  Filled 2024-02-22: qty 1

## 2024-02-22 MED ORDER — SODIUM CHLORIDE 0.9% FLUSH: 3.0000 mL | INTRAVENOUS | Status: DC | PRN

## 2024-02-22 MED ORDER — EPHEDRINE SULFATE-NACL 50-0.9 MG/10ML-% IV SOSY
PREFILLED_SYRINGE | INTRAVENOUS | Status: DC | PRN
  Administered 2024-02-22 (×2): 2.5 mg via INTRAVENOUS

## 2024-02-22 MED ORDER — HEPARIN 6000 UNIT IRRIGATION SOLUTION
Status: DC | PRN
  Administered 2024-02-22: 1

## 2024-02-22 MED ORDER — FENTANYL CITRATE (PF) 100 MCG/2ML IJ SOLN
25.0000 ug | INTRAMUSCULAR | Status: DC | PRN
  Administered 2024-02-22 (×2): 50 ug via INTRAVENOUS

## 2024-02-22 MED ORDER — 0.9 % SODIUM CHLORIDE (POUR BTL) OPTIME
TOPICAL | Status: DC | PRN
Start: 2024-02-22 — End: 2024-02-22
  Administered 2024-02-22: 1000 mL

## 2024-02-22 MED ORDER — MIDAZOLAM HCL 2 MG/2ML IJ SOLN
INTRAMUSCULAR | Status: AC
  Filled 2024-02-22: qty 2

## 2024-02-22 MED ORDER — LIDOCAINE-EPINEPHRINE (PF) 1 %-1:200000 IJ SOLN
INTRAMUSCULAR | Status: AC
Start: 2024-02-22 — End: 2024-02-22
  Filled 2024-02-22: qty 30

## 2024-02-22 MED ORDER — ONDANSETRON HCL 4 MG/2ML IJ SOLN
INTRAMUSCULAR | Status: DC | PRN
  Administered 2024-02-22: 4 mg via INTRAVENOUS

## 2024-02-22 MED ORDER — ACETAMINOPHEN 500 MG PO TABS
1000.0000 mg | ORAL_TABLET | Freq: Once | ORAL | Status: AC
  Administered 2024-02-22: 1000 mg via ORAL
  Filled 2024-02-22: qty 2

## 2024-02-22 MED ORDER — HYDROMORPHONE HCL 1 MG/ML IJ SOLN
INTRAMUSCULAR | Status: AC
  Filled 2024-02-22: qty 0.5

## 2024-02-22 MED ORDER — FENTANYL CITRATE (PF) 100 MCG/2ML IJ SOLN
INTRAMUSCULAR | Status: AC
Start: 2024-02-22 — End: 2024-02-22
  Filled 2024-02-22: qty 2

## 2024-02-22 MED ORDER — MIDAZOLAM HCL 2 MG/2ML IJ SOLN
INTRAMUSCULAR | Status: DC | PRN
  Administered 2024-02-22: 2 mg via INTRAVENOUS

## 2024-02-22 MED ORDER — PROPOFOL 10 MG/ML IV BOLUS
INTRAVENOUS | Status: DC | PRN
  Administered 2024-02-22: 110 mg via INTRAVENOUS
  Administered 2024-02-22: 25 ug/kg/min via INTRAVENOUS
  Administered 2024-02-22: 30 mg via INTRAVENOUS

## 2024-02-22 MED ORDER — FENTANYL CITRATE (PF) 250 MCG/5ML IJ SOLN
INTRAMUSCULAR | Status: DC | PRN
  Administered 2024-02-22: 50 ug via INTRAVENOUS
  Administered 2024-02-22: 25 ug via INTRAVENOUS

## 2024-02-22 MED ORDER — OXYCODONE HCL 5 MG PO TABS
ORAL_TABLET | ORAL | Status: AC
  Filled 2024-02-22: qty 1

## 2024-02-22 MED ORDER — FENTANYL CITRATE (PF) 250 MCG/5ML IJ SOLN
INTRAMUSCULAR | Status: AC
  Filled 2024-02-22: qty 5

## 2024-02-22 SURGICAL SUPPLY — 50 items
BAG COUNTER SPONGE SURGICOUNT (BAG) ×2 IMPLANT
BNDG COMPR ESMARK 6X3 LF (GAUZE/BANDAGES/DRESSINGS) IMPLANT
CANISTER SUCT 1200ML W/VALVE (MISCELLANEOUS) ×2 IMPLANT
CANISTER SUCTION 3000ML PPV (SUCTIONS) ×2 IMPLANT
CLIP LIGATING EXTRA MED SLVR (CLIP) ×2 IMPLANT
CLIP LIGATING EXTRA SM BLUE (MISCELLANEOUS) ×2 IMPLANT
CORONARY SUCKER SOFT TIP 10052 (MISCELLANEOUS) IMPLANT
COVER PROBE W GEL 5X96 (DRAPES) IMPLANT
CUFF TOURN SGL QUICK 42 (TOURNIQUET CUFF) IMPLANT
CUFF TRNQT CYL 24X4X16.5-23 (TOURNIQUET CUFF) IMPLANT
CUFF TRNQT CYL 34X4.125X (TOURNIQUET CUFF) IMPLANT
DERMABOND ADVANCED .7 DNX12 (GAUZE/BANDAGES/DRESSINGS) ×2 IMPLANT
DRAPE X-RAY CASS 24X20 (DRAPES) IMPLANT
ELECTRODE REM PT RTRN 9FT ADLT (ELECTROSURGICAL) ×2 IMPLANT
EVACUATOR SILICONE 100CC (DRAIN) IMPLANT
GAUZE SPONGE 4X4 12PLY STRL (GAUZE/BANDAGES/DRESSINGS) ×2 IMPLANT
GLOVE BIO SURGEON STRL SZ7.5 (GLOVE) ×2 IMPLANT
GOWN STRL REUS W/ TWL LRG LVL3 (GOWN DISPOSABLE) ×6 IMPLANT
GOWN STRL REUS W/ TWL XL LVL3 (GOWN DISPOSABLE) ×2 IMPLANT
KIT BASIN OR (CUSTOM PROCEDURE TRAY) ×2 IMPLANT
KIT TURNOVER KIT B (KITS) ×2 IMPLANT
NS IRRIG 1000ML POUR BTL (IV SOLUTION) ×4 IMPLANT
PACK CV ACCESS (CUSTOM PROCEDURE TRAY) ×2 IMPLANT
PACK PERIPHERAL VASCULAR (CUSTOM PROCEDURE TRAY) ×2 IMPLANT
PAD ARMBOARD POSITIONER FOAM (MISCELLANEOUS) ×4 IMPLANT
POWDER SURGICEL 3.0 GRAM (HEMOSTASIS) IMPLANT
SET COLLECT BLD 21X3/4 12 (NEEDLE) IMPLANT
SPIKE FLUID TRANSFER (MISCELLANEOUS) IMPLANT
SPONGE SURGIFOAM ABS GEL 100 (HEMOSTASIS) IMPLANT
STAPLER SKIN PROX WIDE 3.9 (STAPLE) IMPLANT
STOPCOCK 4 WAY LG BORE MALE ST (IV SETS) IMPLANT
SUT MNCRL AB 4-0 PS2 18 (SUTURE) ×2 IMPLANT
SUT PROLENE 5 0 C 1 24 (SUTURE) ×2 IMPLANT
SUT PROLENE 6 0 BV (SUTURE) IMPLANT
SUT PROLENE 6 0 CC (SUTURE) ×2 IMPLANT
SUT PROLENE 7 0 BV 1 (SUTURE) IMPLANT
SUT PROLENE 7 0 BV1 MDA (SUTURE) IMPLANT
SUT SILK 0 TIES 10X30 (SUTURE) ×2 IMPLANT
SUT SILK 2 0 SH (SUTURE) ×2 IMPLANT
SUT SILK 3-0 18XBRD TIE 12 (SUTURE) IMPLANT
SUT VIC AB 2-0 CTX 36 (SUTURE) ×4 IMPLANT
SUT VIC AB 3-0 SH 27X BRD (SUTURE) ×4 IMPLANT
SUT VIC AB 4-0 PS2 27 (SUTURE) IMPLANT
TAPE UMBILICAL 1/8X30 (MISCELLANEOUS) IMPLANT
TOWEL GREEN STERILE (TOWEL DISPOSABLE) ×2 IMPLANT
TOWEL GREEN STERILE FF (TOWEL DISPOSABLE) ×2 IMPLANT
TRAY FOLEY MTR SLVR 16FR STAT (SET/KITS/TRAYS/PACK) ×2 IMPLANT
TUBING EXTENTION W/L.L. (IV SETS) IMPLANT
UNDERPAD 30X36 HEAVY ABSORB (UNDERPADS AND DIAPERS) ×2 IMPLANT
WATER STERILE IRR 1000ML POUR (IV SOLUTION) ×2 IMPLANT

## 2024-02-22 NOTE — Op Note (Signed)
    Patient name: Katie Lyons MRN: [id number] DOB: 1986/03/23 Sex: female  02/22/2024 Pre-operative Diagnosis: History of end-stage renal disease now status post renal transplant with presence of AV fistula and evidence of steal Post-operative diagnosis:  Same Surgeon:  Penne BROCKS. Sheree, MD Assistant: Adina Sender, PA Procedure Performed: Excision and ligation of left arm cephalic vein AV fistula  Indications: 38 year old female with history of end-stage renal disease previously dialyzed via left upper arm AV fistula and more recently has renal transplant for 5 years which is functioning well.  She has evidence of steal in her left upper extremity and is indicated for ligation of the fistula and we are planning excision concomitantly.  We have discussed the risk benefits and alternatives and she demonstrates good understanding.  Experience assistant was necessary to facilitate exposure of the fistula as well as with ligation.  Findings: Fistula was ligated just past the brachial artery anastomosis and the stump of the fistula was then talked under fascia and sutured down.  There was a palpable radial artery pulse at at the wrist at completion.   Procedure:  The patient was identified in the holding area and taken to the operating room where she was placed supine operative table and LMA anesthesia was induced.  She was sterilely prepped and draped in the left upper extremity in the usual fashion, antibiotics were administered a timeout was called.  We began by opening her previous incision near the antecubitum.  We dissected down and reexposed the proximal fistula as well as the brachial artery proximally and distally of the fistula and these were all encircled with vessel loop.  I then performed an elliptical type incision where she had dense scar tissue around the fistula higher in the arm.  We dissected down and encircled the fistula for the entirety of the incision.  I then clamped the  fistula near the antecubital and massaged the blood cephalad.  The vein was then clamped as high as I could reach through the more cephalad incision and the vein was transected.  I thoroughly flushed this with heparinized saline and reclamped it.  It was then tied off with a 2-0 silk suture and there was no backbleeding.  The rest of the fistula was then mobilized back to the level of the anastomosis.  I then clamped the brachial artery proximally and distally and transected the fistula back to approximately half centimeter from the anastomosis and this was oversewn with running 5-0 Prolene suture in a mattress fashion.  Prior to completion I did flush the the bulb that remained and then completed the anastomosis.  There was a knuckle of fistula that was protruding up into the incision and this was then tacked down with interrupted 3-0 Vicryl suture.  There was an easily palpable radial pulse at the wrist after the all this was done.  We then irrigated the wounds and obtain hemostasis and closed both incisions with Vicryl and Monocryl and Dermabond is placed at the skin level.  The patient was then transferred to recovery in stable condition.  All counts were correct at completion.  EBL: 50 cc   Nathalya Wolanski C. Sheree, MD [facility] Office: [phone] Pager: [phone]

## 2024-02-22 NOTE — Discharge Instructions (Signed)

## 2024-02-22 NOTE — Anesthesia Postprocedure Evaluation (Signed)
 Anesthesia Post Note  Patient: [name]  Procedure(s) Performed: REMOVAL, GRAFT (Left) LIGATION OF COMPETING BRANCHES AND EXCISION OF LEFT UPPER ARM ARTERIOVENOUS FISTULA (Left)     Patient location during evaluation: PACU Anesthesia Type: General Level of consciousness: awake and alert Pain management: pain level controlled Vital Signs Assessment: post-procedure vital signs reviewed and stable Respiratory status: spontaneous breathing, nonlabored ventilation and respiratory function stable Cardiovascular status: blood pressure returned to baseline Postop Assessment: no apparent nausea or vomiting Anesthetic complications: no   No notable events documented.  Last Vitals:  Vitals:   02/22/24 1430 02/22/24 1446  BP: 123/82 115/80  Pulse: 69 69  Resp: 11 15  Temp:  36.9 C  SpO2: 93% 95%    Last Pain:  Vitals:   02/22/24 1446  TempSrc:   PainSc: Asleep                 Vertell Row

## 2024-02-22 NOTE — Progress Notes (Signed)
 Interpreter, Mariel from [place], available after surgery at [phone]

## 2024-02-22 NOTE — Anesthesia Procedure Notes (Signed)
 Procedure Name: LMA Insertion Date/Time: 02/22/2024 12:52 PM  Performed by: Mollie Olivia SAUNDERS, CRNAPre-anesthesia Checklist: Patient identified, Emergency Drugs available, Suction available and Patient being monitored Patient Re-evaluated:Patient Re-evaluated prior to induction Oxygen Delivery Method: Circle System Utilized Preoxygenation: Pre-oxygenation with 100% oxygen Induction Type: IV induction Ventilation: Mask ventilation without difficulty LMA: LMA inserted LMA Size: 4.0 Number of attempts: 1 Placement Confirmation: positive ETCO2 Tube secured with: Tape Dental Injury: Teeth and Oropharynx as per pre-operative assessment

## 2024-02-22 NOTE — Transfer of Care (Signed)
 Immediate Anesthesia Transfer of Care Note  Patient: Dezirae Savedra-Hilario  Procedure(s) Performed: REMOVAL, GRAFT (Left) LIGATION OF COMPETING BRANCHES AND EXCISION OF LEFT UPPER ARM ARTERIOVENOUS FISTULA (Left)  Patient Location: PACU  Anesthesia Type:General  Level of Consciousness: awake, drowsy, and responds to stimulation  Airway & Oxygen Therapy: Patient Spontanous Breathing and Patient connected to face mask oxygen  Post-op Assessment: Report given to RN and Post -op Vital signs reviewed and stable  Post vital signs: Reviewed and stable  Last Vitals:  Vitals Value Taken Time  BP    Temp    Pulse 67 02/22/24 14:17  Resp    SpO2 97 % 02/22/24 14:17  Vitals shown include unfiled device data.  Last Pain:  Vitals:   02/22/24 1036  TempSrc:   PainSc: 0-No pain         Complications: No notable events documented.

## 2024-02-22 NOTE — Interval H&P Note (Signed)
 History and Physical Interval Note:  02/22/2024 9:49 AM  Katie Lyons  has presented today for surgery, with the diagnosis of PARASETHESIAS L HAND.  The various methods of treatment have been discussed with the patient and family. After consideration of risks, benefits and other options for treatment, the patient has consented to  Procedure(s) with comments: REMOVAL, GRAFT (Left) - AVF EXCISION LIGATION OF COMPETING BRANCHES OF ARTERIOVENOUS FISTULA (Left) as a surgical intervention.  The patient's history has been reviewed, patient examined, no change in status, stable for surgery.  I have reviewed the patient's chart and labs.  Questions were answered to the patient's satisfaction.     [name]

## 2024-02-23 ENCOUNTER — Encounter (HOSPITAL_COMMUNITY): Payer: Self-pay | Admitting: Vascular Surgery

## 2024-03-14 ENCOUNTER — Other Ambulatory Visit: Payer: Self-pay

## 2024-03-14 ENCOUNTER — Encounter (HOSPITAL_BASED_OUTPATIENT_CLINIC_OR_DEPARTMENT_OTHER): Payer: Self-pay

## 2024-03-14 ENCOUNTER — Telehealth: Payer: Self-pay

## 2024-03-14 ENCOUNTER — Observation Stay (HOSPITAL_BASED_OUTPATIENT_CLINIC_OR_DEPARTMENT_OTHER)
Admission: EM | Admit: 2024-03-14 | Discharge: 2024-03-15 | Disposition: A | Discharge status: Home / Self Care | Attending: Emergency Medicine | Admitting: Emergency Medicine

## 2024-03-14 DIAGNOSIS — F411 Generalized anxiety disorder: Secondary | ICD-10-CM | POA: Insufficient documentation

## 2024-03-14 DIAGNOSIS — N186 End stage renal disease: Secondary | ICD-10-CM | POA: Insufficient documentation

## 2024-03-14 DIAGNOSIS — N179 Acute kidney failure, unspecified: Secondary | ICD-10-CM | POA: Insufficient documentation

## 2024-03-14 DIAGNOSIS — Z940 Kidney transplant status: Secondary | ICD-10-CM | POA: Insufficient documentation

## 2024-03-14 DIAGNOSIS — R739 Hyperglycemia, unspecified: Principal | ICD-10-CM | POA: Diagnosis present

## 2024-03-14 DIAGNOSIS — I132 Hypertensive heart and chronic kidney disease with heart failure and with stage 5 chronic kidney disease, or end stage renal disease: Secondary | ICD-10-CM | POA: Insufficient documentation

## 2024-03-14 DIAGNOSIS — I10 Essential (primary) hypertension: Secondary | ICD-10-CM | POA: Diagnosis present

## 2024-03-14 DIAGNOSIS — Z8679 Personal history of other diseases of the circulatory system: Secondary | ICD-10-CM | POA: Diagnosis not present

## 2024-03-14 DIAGNOSIS — I5020 Unspecified systolic (congestive) heart failure: Secondary | ICD-10-CM

## 2024-03-14 DIAGNOSIS — E1065 Type 1 diabetes mellitus with hyperglycemia: Principal | ICD-10-CM | POA: Insufficient documentation

## 2024-03-14 DIAGNOSIS — R350 Frequency of micturition: Secondary | ICD-10-CM | POA: Diagnosis present

## 2024-03-14 DIAGNOSIS — T8610 Unspecified complication of kidney transplant: Secondary | ICD-10-CM

## 2024-03-14 DIAGNOSIS — E119 Type 2 diabetes mellitus without complications: Secondary | ICD-10-CM

## 2024-03-14 DIAGNOSIS — I7389 Other specified peripheral vascular diseases: Secondary | ICD-10-CM

## 2024-03-14 DIAGNOSIS — I5030 Unspecified diastolic (congestive) heart failure: Secondary | ICD-10-CM | POA: Diagnosis not present

## 2024-03-14 DIAGNOSIS — I5032 Chronic diastolic (congestive) heart failure: Secondary | ICD-10-CM

## 2024-03-14 LAB — URINALYSIS, ROUTINE W REFLEX MICROSCOPIC
Bilirubin Urine: NEGATIVE
Glucose, UA: >1000 mg/dL — AB
Hgb urine dipstick: NEGATIVE
Ketones, ur: NEGATIVE mg/dL
Leukocytes,Ua: NEGATIVE
Nitrite: NEGATIVE
Protein, ur: NEGATIVE mg/dL
Specific Gravity, Urine: 1.029 (ref 1.005–1.030)
pH: 6.5 (ref 5.0–8.0)

## 2024-03-14 LAB — CBC WITH DIFFERENTIAL/PLATELET
Abs Immature Granulocytes: 0.03 K/uL (ref 0.00–0.07)
Basophils Absolute: 0 K/uL (ref 0.0–0.1)
Basophils Relative: 0 %
Eosinophils Absolute: 0.1 K/uL (ref 0.0–0.5)
Eosinophils Relative: 1 %
HCT: 39.2 % (ref 36.0–46.0)
Hemoglobin: 13.8 g/dL (ref 12.0–15.0)
Immature Granulocytes: 0 %
Lymphocytes Relative: 23 %
Lymphs Abs: 2.2 K/uL (ref 0.7–4.0)
MCH: 29.7 pg (ref 26.0–34.0)
MCHC: 35.2 g/dL (ref 30.0–36.0)
MCV: 84.3 fL (ref 80.0–100.0)
Monocytes Absolute: 0.5 K/uL (ref 0.1–1.0)
Monocytes Relative: 5 %
Neutro Abs: 6.6 K/uL (ref 1.7–7.7)
Neutrophils Relative %: 71 %
Platelets: 197 K/uL (ref 150–400)
RBC: 4.65 MIL/uL (ref 3.87–5.11)
RDW: 12.9 % (ref 11.5–15.5)
WBC: 9.4 K/uL (ref 4.0–10.5)
nRBC: 0 % (ref 0.0–0.2)

## 2024-03-14 LAB — BASIC METABOLIC PANEL WITH GFR
Anion gap: 15 (ref 5–15)
Anion gap: 13 (ref 5–15)
BUN: 18 mg/dL (ref 6–20)
BUN: 15 mg/dL (ref 6–20)
CO2: 17 mmol/L — ABNORMAL LOW (ref 22–32)
CO2: 17 mmol/L — ABNORMAL LOW (ref 22–32)
Calcium: 9.7 mg/dL (ref 8.9–10.3)
Calcium: 9.2 mg/dL (ref 8.9–10.3)
Chloride: 97 mmol/L — ABNORMAL LOW (ref 98–111)
Chloride: 102 mmol/L (ref 98–111)
Creatinine, Ser: 1.01 mg/dL — ABNORMAL HIGH (ref 0.44–1.00)
Creatinine, Ser: 0.83 mg/dL (ref 0.44–1.00)
GFR, Estimated: >60 mL/min (ref >60)
GFR, Estimated: >60 mL/min (ref >60)
Glucose, Bld: 682 mg/dL (ref 70–99)
Glucose, Bld: 442 mg/dL — ABNORMAL HIGH (ref 70–99)
Potassium: 3.9 mmol/L (ref 3.5–5.1)
Potassium: 4.1 mmol/L (ref 3.5–5.1)
Sodium: 129 mmol/L — ABNORMAL LOW (ref 135–145)
Sodium: 132 mmol/L — ABNORMAL LOW (ref 135–145)

## 2024-03-14 LAB — BETA-HYDROXYBUTYRIC ACID: Beta-Hydroxybutyric Acid: 0.37 mmol/L — ABNORMAL HIGH (ref 0.05–0.27)

## 2024-03-14 LAB — I-STAT VENOUS BLOOD GAS, ED
Acid-base deficit: 5 mmol/L — ABNORMAL HIGH (ref 0.0–2.0)
Bicarbonate: 18.9 mmol/L — ABNORMAL LOW (ref 20.0–28.0)
Calcium, Ion: 1.35 mmol/L (ref 1.15–1.40)
HCT: 39 % (ref 36.0–46.0)
Hemoglobin: 13.3 g/dL (ref 12.0–15.0)
O2 Saturation: 92 %
Patient temperature: 97.8
Potassium: 4 mmol/L (ref 3.5–5.1)
Sodium: 131 mmol/L — ABNORMAL LOW (ref 135–145)
TCO2: 20 mmol/L — ABNORMAL LOW (ref 22–32)
pCO2, Ven: 31.7 mmHg — ABNORMAL LOW (ref 44–60)
pH, Ven: 7.382 (ref 7.25–7.43)
pO2, Ven: 64 mmHg — ABNORMAL HIGH (ref 32–45)

## 2024-03-14 LAB — CBG MONITORING, ED
Glucose-Capillary: >600 mg/dL (ref 70–99)
Glucose-Capillary: 568 mg/dL (ref 70–99)
Glucose-Capillary: 440 mg/dL — ABNORMAL HIGH (ref 70–99)

## 2024-03-14 MED ORDER — LACTATED RINGERS IV BOLUS
1000.0000 mL | Freq: Once | INTRAVENOUS | Status: AC
  Administered 2024-03-14: 1000 mL via INTRAVENOUS

## 2024-03-14 MED ORDER — SODIUM CHLORIDE 0.9 % IV BOLUS
1000.0000 mL | Freq: Once | INTRAVENOUS | Status: AC
Start: 2024-03-14 — End: 2024-03-14
  Administered 2024-03-14: 1000 mL via INTRAVENOUS

## 2024-03-14 NOTE — ED Triage Notes (Signed)
 Pt reports having fistula in L arm removed on JUL 11. Pt had kidney transplant x5 years ago. Pt reports increased fatigue and sleepiness ever since surgery. Pt also reports decreased taste.

## 2024-03-14 NOTE — Telephone Encounter (Signed)
 Pt's daughter called to report her mother is feeling dehydrated and lethargic after her surgery on 02/22/24.  Pt's daughter reported she doesn't want to get out of bed.  Advised pt to go to the ED.  Pt's daughter knows to call EMS and verbally agreed to do so.

## 2024-03-14 NOTE — Plan of Care (Signed)
 Plan of Care Note for accepted transfer   Patient name: Katie Lyons FMW:[id number] DOB: April 08, 1986  Facility requesting transfer: [facility] Requesting Provider: PA Nora Domino Facility course: 38 year old female with history of ESRD status post renal transplant on chronic steroids, chronic HFrEF, hypertension, iron  deficiency, secondary hyperparathyroidism, TBI, anxiety/depression.  She underwent excision and ligation of left arm cephalic vein AV fistula last month on 7/11.  She presents to the ED today for evaluation of fatigue/lethargy/somnolence since after her surgery.  Afebrile.  Labs showing no leukocytosis or anemia, sodium 129, chloride 97, glucose 682, bicarb 17, anion gap 15, beta hydroxybutyric acid 0.37, VBG with pH 7.38, creatinine 1.0, UA not suggestive of infection. Patient was given 2 L IV fluids and repeat CBG 440.  Given concern for possible early DKA, ED PA will repeat labs and if there is still ketoacidosis/elevated anion gap, patient will be started on insulin  drip in the ED.  Plan of care: The patient is accepted for admission to Progressive unit at [facility].  [facility] will assume care on arrival to accepting facility. Until arrival, care as per EDP. However, [facility] available 24/7 for questions and assistance.  Check [email or web address] for on-call coverage.  Nursing staff, please call [facility] Admits & Consults System-Wide number under Amion on patient's arrival so appropriate admitting provider can evaluate the pt.

## 2024-03-15 ENCOUNTER — Encounter (HOSPITAL_COMMUNITY): Payer: Self-pay | Admitting: Internal Medicine

## 2024-03-15 ENCOUNTER — Other Ambulatory Visit (HOSPITAL_COMMUNITY): Payer: Self-pay

## 2024-03-15 DIAGNOSIS — N179 Acute kidney failure, unspecified: Secondary | ICD-10-CM | POA: Insufficient documentation

## 2024-03-15 DIAGNOSIS — Z8679 Personal history of other diseases of the circulatory system: Secondary | ICD-10-CM

## 2024-03-15 DIAGNOSIS — R739 Hyperglycemia, unspecified: Secondary | ICD-10-CM | POA: Diagnosis not present

## 2024-03-15 DIAGNOSIS — F411 Generalized anxiety disorder: Secondary | ICD-10-CM | POA: Insufficient documentation

## 2024-03-15 DIAGNOSIS — I5032 Chronic diastolic (congestive) heart failure: Secondary | ICD-10-CM

## 2024-03-15 DIAGNOSIS — I10 Essential (primary) hypertension: Secondary | ICD-10-CM

## 2024-03-15 DIAGNOSIS — T8610 Unspecified complication of kidney transplant: Secondary | ICD-10-CM

## 2024-03-15 DIAGNOSIS — Z7952 Long term (current) use of systemic steroids: Secondary | ICD-10-CM

## 2024-03-15 DIAGNOSIS — I5020 Unspecified systolic (congestive) heart failure: Secondary | ICD-10-CM

## 2024-03-15 DIAGNOSIS — Z940 Kidney transplant status: Secondary | ICD-10-CM | POA: Diagnosis not present

## 2024-03-15 DIAGNOSIS — I7389 Other specified peripheral vascular diseases: Secondary | ICD-10-CM

## 2024-03-15 LAB — URINALYSIS, ROUTINE W REFLEX MICROSCOPIC
Bacteria, UA: NONE SEEN
Bilirubin Urine: NEGATIVE
Glucose, UA: >=500 mg/dL — AB
Hgb urine dipstick: NEGATIVE
Ketones, ur: NEGATIVE mg/dL
Leukocytes,Ua: NEGATIVE
Nitrite: NEGATIVE
Protein, ur: NEGATIVE mg/dL
Specific Gravity, Urine: 1.02 (ref 1.005–1.030)
pH: 6 (ref 5.0–8.0)

## 2024-03-15 LAB — BASIC METABOLIC PANEL WITH GFR
Anion gap: 7 (ref 5–15)
Anion gap: 9 (ref 5–15)
BUN: 12 mg/dL (ref 6–20)
BUN: 11 mg/dL (ref 6–20)
CO2: 19 mmol/L — ABNORMAL LOW (ref 22–32)
CO2: 17 mmol/L — ABNORMAL LOW (ref 22–32)
Calcium: 9.2 mg/dL (ref 8.9–10.3)
Calcium: 9.4 mg/dL (ref 8.9–10.3)
Chloride: 108 mmol/L (ref 98–111)
Chloride: 111 mmol/L (ref 98–111)
Creatinine, Ser: 0.72 mg/dL (ref 0.44–1.00)
Creatinine, Ser: 0.73 mg/dL (ref 0.44–1.00)
GFR, Estimated: >60 mL/min (ref >60)
GFR, Estimated: >60 mL/min (ref >60)
Glucose, Bld: 294 mg/dL — ABNORMAL HIGH (ref 70–99)
Glucose, Bld: 78 mg/dL (ref 70–99)
Potassium: 3.8 mmol/L (ref 3.5–5.1)
Potassium: 3.3 mmol/L — ABNORMAL LOW (ref 3.5–5.1)
Sodium: 134 mmol/L — ABNORMAL LOW (ref 135–145)
Sodium: 137 mmol/L (ref 135–145)

## 2024-03-15 LAB — GLUCOSE, CAPILLARY
Glucose-Capillary: 318 mg/dL — ABNORMAL HIGH (ref 70–99)
Glucose-Capillary: 102 mg/dL — ABNORMAL HIGH (ref 70–99)
Glucose-Capillary: 383 mg/dL — ABNORMAL HIGH (ref 70–99)
Glucose-Capillary: 182 mg/dL — ABNORMAL HIGH (ref 70–99)

## 2024-03-15 LAB — CBC
HCT: 37.3 % (ref 36.0–46.0)
Hemoglobin: 13.4 g/dL (ref 12.0–15.0)
MCH: 29.8 pg (ref 26.0–34.0)
MCHC: 35.9 g/dL (ref 30.0–36.0)
MCV: 83.1 fL (ref 80.0–100.0)
Platelets: 230 K/uL (ref 150–400)
RBC: 4.49 MIL/uL (ref 3.87–5.11)
RDW: 12.7 % (ref 11.5–15.5)
WBC: 13.7 K/uL — ABNORMAL HIGH (ref 4.0–10.5)
nRBC: 0 % (ref 0.0–0.2)

## 2024-03-15 LAB — HIV ANTIBODY (ROUTINE TESTING W REFLEX): HIV Screen 4th Generation wRfx: NONREACTIVE

## 2024-03-15 LAB — OSMOLALITY: Osmolality: 284 mosm/kg (ref 275–295)

## 2024-03-15 LAB — OSMOLALITY, URINE: Osmolality, Ur: 716 mosm/kg (ref 300–900)

## 2024-03-15 MED ORDER — DEXTROSE 50 % IV SOLN: 0.0000 mL | INTRAVENOUS | Status: DC | PRN

## 2024-03-15 MED ORDER — HEPARIN SODIUM (PORCINE) 5000 UNIT/ML IJ SOLN: 5000.0000 [IU] | Freq: Three times a day (TID) | INTRAMUSCULAR | Status: DC

## 2024-03-15 MED ORDER — LACTATED RINGERS IV BOLUS
20.0000 mL/kg | Freq: Once | INTRAVENOUS | Status: DC
  Administered 2024-03-15: 1212 mL via INTRAVENOUS

## 2024-03-15 MED ORDER — INSULIN REGULAR(HUMAN) IN NACL 100-0.9 UT/100ML-% IV SOLN: INTRAVENOUS | Status: DC

## 2024-03-15 MED ORDER — POTASSIUM CHLORIDE 10 MEQ/100ML IV SOLN
10.0000 meq | INTRAVENOUS | Status: AC
  Administered 2024-03-15 (×2): 10 meq via INTRAVENOUS
  Filled 2024-03-15 (×2): qty 100

## 2024-03-15 MED ORDER — ACETAMINOPHEN 325 MG PO TABS
650.0000 mg | ORAL_TABLET | Freq: Four times a day (QID) | ORAL | Status: DC | PRN
  Administered 2024-03-15: 650 mg via ORAL
  Filled 2024-03-15: qty 2

## 2024-03-15 MED ORDER — POTASSIUM CHLORIDE CRYS ER 20 MEQ PO TBCR
40.0000 meq | EXTENDED_RELEASE_TABLET | Freq: Once | ORAL | Status: AC
  Administered 2024-03-15: 40 meq via ORAL
  Filled 2024-03-15: qty 2

## 2024-03-15 MED ORDER — SODIUM BICARBONATE 650 MG PO TABS: 2600.0000 mg | ORAL_TABLET | Freq: Three times a day (TID) | ORAL | Status: DC

## 2024-03-15 MED ORDER — LACTATED RINGERS IV BOLUS: 1000.0000 mL | Freq: Once | INTRAVENOUS | Status: DC

## 2024-03-15 MED ORDER — METFORMIN HCL 500 MG PO TABS
500.0000 mg | ORAL_TABLET | Freq: Two times a day (BID) | ORAL | 0 refills | Status: AC
  Filled 2024-03-15: qty 60, 30d supply, fill #0

## 2024-03-15 MED ORDER — INSULIN ASPART 100 UNIT/ML IJ SOLN
10.0000 [IU] | INTRAMUSCULAR | Status: AC
  Administered 2024-03-15: 10 [IU] via SUBCUTANEOUS

## 2024-03-15 MED ORDER — POTASSIUM CHLORIDE CRYS ER 20 MEQ PO TBCR
20.0000 meq | EXTENDED_RELEASE_TABLET | Freq: Once | ORAL | Status: AC
  Administered 2024-03-15: 20 meq via ORAL
  Filled 2024-03-15: qty 1

## 2024-03-15 MED ORDER — SODIUM CHLORIDE 0.9% FLUSH: 3.0000 mL | INTRAVENOUS | Status: DC | PRN

## 2024-03-15 MED ORDER — LIVING WELL WITH DIABETES BOOK - IN SPANISH
Freq: Once | Status: DC
  Filled 2024-03-15: qty 1

## 2024-03-15 MED ORDER — PREDNISONE 5 MG PO TABS
5.0000 mg | ORAL_TABLET | Freq: Every day | ORAL | Status: DC
  Administered 2024-03-15: 5 mg via ORAL
  Filled 2024-03-15: qty 1

## 2024-03-15 MED ORDER — SODIUM CHLORIDE 0.9% FLUSH: 3.0000 mL | Freq: Two times a day (BID) | INTRAVENOUS | Status: DC

## 2024-03-15 MED ORDER — ONDANSETRON HCL 4 MG/2ML IJ SOLN: 4.0000 mg | Freq: Four times a day (QID) | INTRAMUSCULAR | Status: DC | PRN

## 2024-03-15 MED ORDER — SODIUM BICARBONATE 8.4 % IV SOLN: 100.0000 meq | Freq: Once | INTRAVENOUS | Status: DC

## 2024-03-15 MED ORDER — INSULIN ASPART 100 UNIT/ML IJ SOLN: 0.0000 [IU] | Freq: Four times a day (QID) | INTRAMUSCULAR | Status: DC | PRN

## 2024-03-15 MED ORDER — INSULIN ASPART 100 UNIT/ML IJ SOLN
0.0000 [IU] | Freq: Four times a day (QID) | INTRAMUSCULAR | Status: DC
  Administered 2024-03-15: 15 [IU] via SUBCUTANEOUS

## 2024-03-15 MED ORDER — SODIUM CHLORIDE 0.9 % IV SOLN: 250.0000 mL | INTRAVENOUS | Status: DC | PRN

## 2024-03-15 MED ORDER — BLOOD GLUCOSE TEST VI STRP
1.0000 | ORAL_STRIP | Freq: Three times a day (TID) | 0 refills | Status: AC
  Filled 2024-03-15: qty 100, 30d supply, fill #0

## 2024-03-15 MED ORDER — METFORMIN HCL 500 MG PO TABS
500.0000 mg | ORAL_TABLET | Freq: Two times a day (BID) | ORAL | Status: DC
  Administered 2024-03-15: 500 mg via ORAL
  Filled 2024-03-15: qty 1

## 2024-03-15 MED ORDER — ACETAMINOPHEN 650 MG RE SUPP: 650.0000 mg | Freq: Four times a day (QID) | RECTAL | Status: DC | PRN

## 2024-03-15 MED ORDER — DEXTROSE IN LACTATED RINGERS 5 % IV SOLN: INTRAVENOUS | Status: DC

## 2024-03-15 MED ORDER — SODIUM BICARBONATE 650 MG PO TABS
1300.0000 mg | ORAL_TABLET | Freq: Three times a day (TID) | ORAL | Status: DC
  Administered 2024-03-15 (×2): 1300 mg via ORAL
  Filled 2024-03-15 (×2): qty 2

## 2024-03-15 MED ORDER — HYDRALAZINE HCL 20 MG/ML IJ SOLN: 10.0000 mg | Freq: Four times a day (QID) | INTRAMUSCULAR | Status: DC | PRN

## 2024-03-15 MED ORDER — POTASSIUM CHLORIDE CRYS ER 20 MEQ PO TBCR: 40.0000 meq | EXTENDED_RELEASE_TABLET | Freq: Once | ORAL | Status: DC

## 2024-03-15 MED ORDER — ONDANSETRON HCL 4 MG PO TABS: 4.0000 mg | ORAL_TABLET | Freq: Four times a day (QID) | ORAL | Status: DC | PRN

## 2024-03-15 MED ORDER — LACTATED RINGERS IV SOLN: INTRAVENOUS | Status: DC

## 2024-03-15 MED ORDER — AMLODIPINE BESYLATE 5 MG PO TABS
5.0000 mg | ORAL_TABLET | Freq: Every day | ORAL | 0 refills | Status: AC
  Filled 2024-03-15: qty 30, 30d supply, fill #0

## 2024-03-15 MED ORDER — LANCETS MISC
1.0000 | Freq: Three times a day (TID) | 0 refills | Status: AC
  Filled 2024-03-15: qty 100, 30d supply, fill #0

## 2024-03-15 MED ORDER — LANCET DEVICE MISC
1.0000 | Freq: Three times a day (TID) | 0 refills | Status: AC
  Filled 2024-03-15: qty 1, 30d supply, fill #0

## 2024-03-15 MED ORDER — AMLODIPINE BESYLATE 5 MG PO TABS
5.0000 mg | ORAL_TABLET | Freq: Every day | ORAL | Status: DC
  Administered 2024-03-15: 5 mg via ORAL
  Filled 2024-03-15 (×2): qty 1

## 2024-03-15 MED ORDER — BLOOD GLUCOSE MONITOR SYSTEM W/DEVICE KIT
1.0000 | PACK | Freq: Three times a day (TID) | 0 refills | Status: AC
  Filled 2024-03-15: qty 1, 30d supply, fill #0

## 2024-03-15 MED ORDER — TACROLIMUS 1 MG PO CAPS
7.0000 mg | ORAL_CAPSULE | Freq: Every day | ORAL | Status: DC
  Administered 2024-03-15: 7 mg via ORAL
  Filled 2024-03-15: qty 7

## 2024-03-15 MED ORDER — SODIUM BICARBONATE 650 MG PO TABS: 650.0000 mg | ORAL_TABLET | Freq: Three times a day (TID) | ORAL | Status: DC

## 2024-03-15 MED ORDER — MYCOPHENOLATE SODIUM 180 MG PO TBEC
360.0000 mg | DELAYED_RELEASE_TABLET | Freq: Two times a day (BID) | ORAL | Status: DC
  Administered 2024-03-15 (×2): 360 mg via ORAL
  Filled 2024-03-15 (×3): qty 2

## 2024-03-15 MED ORDER — TACROLIMUS 1 MG PO CAPS
6.0000 mg | ORAL_CAPSULE | Freq: Every day | ORAL | Status: DC
  Administered 2024-03-15: 6 mg via ORAL
  Filled 2024-03-15 (×2): qty 6

## 2024-03-15 NOTE — H&P (Signed)
 History and Physical    Katie Lyons FMW:[id number] DOB: 1985-09-17 DOA: 03/14/2024  PCP: Vinson Rilla Marca Hadassah, MD   Patient coming from: Home   Chief Complaint:  Chief Complaint  Patient presents with   Fatigue   ED TRIAGE note:Pt reports having fistula in L arm removed on JUL 11. Pt had kidney transplant x5 years ago. Pt reports increased fatigue and sleepiness ever since surgery. Pt also reports decreased taste.   HPI:  38 year old female with history of ESRD status post renal transplant on chronic steroids, diastolic heart failure preserved EF, hypertension, iron  deficiency, secondary hyperparathyroidism, TBI, anxiety/depression.  She underwent excision and ligation of left arm cephalic vein AV fistula last month on 7/11.  She presents to the ED today for evaluation of fatigue/lethargy/somnolence since after her surgery.    At presentation to ED patient is afebrile and hemodynamically stable except blood pressure is borderline elevated.  Initial BMP showed low sodium 29, low bicarb 17, normal anion gap, elevated blood glucose 682 and elevated creatinine 1.01.  Repeat BMP showing serum sodium 132, bicarb 17 and blood glucose has been improved to 442 after giving 2 L of LR bolus in the ED. VBG showed low bicarb 18 and normal pH.  Elevated beta-hydroxybutyrate level 0.37. Most recent blood glucose is 440. CBC unremarkable.  In the ED patient received 2 L of IV fluid bolus. Patient did not receive any insulin  regimen in the ED. Patient does not have any history of DM type II. Patient has been transferred to [facility] for management of hyperglycemia and AKI.  To my evaluation at the bedside patient is alert oriented x 3.  Patient reported that for last 2 to 3 days patient has polyuria, polydipsia and feeling tired.  Patient denies any nausea, vomiting and abdominal pain.  No other complaint at this time.  Patient is a Spanish-speaking and utilized video  interpreter at the bedside.  Significant labs in the ED: Lab Orders         CBC with Differential         Basic metabolic panel         Urinalysis, Routine w reflex microscopic -Urine, Clean Catch         Beta-hydroxybutyric acid         Hemoglobin A1c         Basic metabolic panel         HIV Antibody (routine testing w rflx)         CBC         Osmolality, urine         Osmolality         Urinalysis, Routine w reflex microscopic -Urine, Clean Catch         Basic metabolic panel         Basic metabolic panel         Glucose, capillary         CBG monitoring, ED         I-Stat venous blood gas, ([facility] ED, [facility], [facility])         POC CBG, ED         CBG monitoring, ED       Review of Systems:  Review of Systems  Constitutional:  Positive for malaise/fatigue. Negative for chills, fever and weight loss.  Eyes:  Negative for blurred vision.  Respiratory:  Negative for cough, sputum production and shortness of breath.   Cardiovascular:  Negative for chest pain and  palpitations.  Gastrointestinal:  Negative for abdominal pain, diarrhea, heartburn, nausea and vomiting.  Genitourinary:  Negative for dysuria and urgency.  Neurological:  Negative for dizziness and headaches.  Endo/Heme/Allergies:  Positive for polydipsia.       Polyuria  Psychiatric/Behavioral:  The patient is not nervous/anxious.     Past Medical History:  Diagnosis Date   Anxiety    CHF (congestive heart failure) (HCC) 11/14/2006   LVEF 35-40%, pericardial effusion, severe HTN when admitted for renal failure, HD initiated; Negative stress echo, LVEF 65-70% 08/28/17   Depression    3-4 years ago per husband   End stage renal disease (HCC)    Headache    Hypertension    Iron  deficiency    Secondary hyperparathyroidism (HCC)    Traumatic brain injury [facility])     Past Surgical History:  Procedure Laterality Date   ARTERIOVENOUS GRAFT PLACEMENT     AV FISTULA PLACEMENT Left 04/13/2016   Procedure: UPPER EXTREMITY  LEFT  ARTERIOVENOUS (AV) FISTULA CREATION;  Surgeon: Penne Lonni [place], MD;  Location: [facility] OR;  Service: Vascular;  Laterality: Left;   AVGG REMOVAL Right 04/10/2016   Procedure: REMOVAL OF Right Leg ARTERIOVENOUS GORETEX GRAFT (AVGG);  Surgeon: Penne Lonni [place], MD;  Location: [facility] OR;  Service: Vascular;  Laterality: Right;   INSERTION OF DIALYSIS CATHETER Right 04/10/2016   Procedure: insertion Dialysis catheter;  Surgeon: Penne Lonni [place], MD;  Location: [facility] OR;  Service: Vascular;  Laterality: Right;   INSERTION OF DIALYSIS CATHETER Right 04/13/2016   Procedure: INSERTION OF DIALYSIS CATHETER RIGHT INTERNAL JUGULAR;  Surgeon: Penne Lonni [place], MD;  Location: [facility] OR;  Service: Vascular;  Laterality: Right;   IR FLUORO GUIDE CV LINE RIGHT  03/11/2021   IR PERC TUN PERIT CATH WO PORT S&I /IMAG  07/21/2021   IR US  GUIDE VASC ACCESS RIGHT  03/11/2021   Kidney Transplant x 2 months ago  04/2018   LIGATION OF COMPETING BRANCHES OF ARTERIOVENOUS FISTULA Left 02/22/2024   Procedure: LIGATION OF COMPETING BRANCHES AND EXCISION OF LEFT UPPER ARM ARTERIOVENOUS FISTULA;  Surgeon: [place] Penne Lonni, MD;  Location: [facility] OR;  Service: Vascular;  Laterality: Left;   REMOVAL OF GRAFT Left 02/22/2024   Procedure: REMOVAL, GRAFT;  Surgeon: [place] Penne Lonni, MD;  Location: [facility] OR;  Service: Vascular;  Laterality: Left;  AVF EXCISION   REVISION OF ARTERIOVENOUS GORETEX GRAFT Right 01/18/2016   Procedure: REVISION OF ARTERIOVENOUS GORETEX GRAFT Right leg;  Surgeon: Krystal JULIANNA Doing, MD;  Location[facility] OR;  Service: Vascular;  Laterality: Right;     reports that she has never smoked. She has never used smokeless tobacco. She reports that she does not drink alcohol and does not use drugs.  No Known Allergies  History reviewed. No pertinent family history.  Prior to Admission medications   Medication Sig Start Date End Date Taking? Authorizing Provider  acetaminophen  (TYLENOL )  500 MG tablet Take 500-1,000 mg by mouth every 6 (six) hours as needed for mild pain (pain score 1-3) or headache.    [provider]  HYDROcodone -acetaminophen  (NORCO/VICODIN) 5-325 MG tablet Take 1 tablet by mouth every 6 (six) hours as needed for moderate pain (pain score 4-6). 02/22/24   Bethanie Cough, PA-C  mycophenolate  (MYFORTIC ) 360 MG TBEC EC tablet Take 360 mg by mouth 2 (two) times daily.    [provider]  predniSONE  (DELTASONE ) 5 MG tablet Take 5 mg by mouth daily with breakfast.    [provider]  sodium  bicarbonate 650 MG tablet Take 2,600 mg by mouth 3 (three) times daily. Four tablets three times a day 05/20/18   [provider]  tacrolimus  (PROGRAF ) 1 MG capsule Take 6-7 mg by mouth See admin instructions. Take 7 tablets in the AM and 6 tablets in the PM. 10/07/18   [provider]     Physical Exam: Vitals:   03/14/24 2130 03/14/24 2145 03/14/24 2300 03/14/24 2353  BP: (!) 145/92 (!) 150/85 (!) 159/99 (!) 163/98  Pulse: 63 67 66 60  Resp: 14     Temp: 98.3 F (36.8 C)   98.8 F (37.1 C)  TempSrc: Oral   Oral  SpO2: 99% 100% 100% 100%  Weight:    60.6 kg  Height:        Physical Exam Vitals and nursing note reviewed.  Constitutional:      Appearance: She is not ill-appearing.  HENT:     Mouth/Throat:     Mouth: Mucous membranes are dry.  Eyes:     Pupils: Pupils are equal, round, and reactive to light.  Cardiovascular:     Rate and Rhythm: Normal rate and regular rhythm.     Pulses: Normal pulses.     Heart sounds: Normal heart sounds.  Pulmonary:     Effort: Pulmonary effort is normal.     Breath sounds: Normal breath sounds.  Abdominal:     Palpations: Abdomen is soft.  Musculoskeletal:     Cervical back: Neck supple.  Skin:    General: Skin is dry.     Capillary Refill: Capillary refill takes less than 2 seconds.  Neurological:     Mental Status: She is alert and oriented to person, place, and time.   Psychiatric:        Mood and Affect: Mood normal.        Thought Content: Thought content normal.      Labs on Admission: I have personally reviewed following labs and imaging studies  CBC: Recent Labs  Lab 03/14/24 1725 03/14/24 1932  WBC 9.4  --   NEUTROABS 6.6  --   HGB 13.8 13.3  HCT 39.2 39.0  MCV 84.3  --   PLT 197  --    Basic Metabolic Panel: Recent Labs  Lab 03/14/24 1725 03/14/24 1932 03/14/24 2249  NA 129* 131* 132*  K 3.9 4.0 4.1  CL 97*  --  102  CO2 17*  --  17*  GLUCOSE 682*  --  442*  BUN 18  --  15  CREATININE 1.01*  --  0.83  CALCIUM  9.7  --  9.2   GFR: Estimated Creatinine Clearance: 73.3 mL/min (by C-G formula based on SCr of 0.83 mg/dL). Liver Function Tests: No results for input(s): AST, ALT, ALKPHOS, BILITOT, PROT, ALBUMIN in the last 168 hours. No results for input(s): LIPASE, AMYLASE in the last 168 hours. No results for input(s): AMMONIA in the last 168 hours. Coagulation Profile: No results for input(s): INR, PROTIME in the last 168 hours. Cardiac Enzymes: No results for input(s): CKTOTAL, CKMB, CKMBINDEX, TROPONINI, TROPONINIHS in the last 168 hours. BNP (last 3 results) No results for input(s): BNP in the last 8760 hours. HbA1C: No results for input(s): HGBA1C in the last 72 hours. CBG: Recent Labs  Lab 03/14/24 1819 03/14/24 2041 03/14/24 2213 03/15/24 0142  GLUCAP >600* 568* 440* 318*   Lipid Profile: No results for input(s): CHOL, HDL, LDLCALC, TRIG, CHOLHDL, LDLDIRECT in the last 72 hours. Thyroid Function Tests:  No results for input(s): TSH, T4TOTAL, FREET4, T3FREE, THYROIDAB in the last 72 hours. Anemia Panel: No results for input(s): VITAMINB12, FOLATE, FERRITIN, TIBC, IRON , RETICCTPCT in the last 72 hours. Urine analysis:    Component Value Date/Time   COLORURINE COLORLESS (A) 03/14/2024 1725   APPEARANCEUR CLEAR 03/14/2024 1725   LABSPEC  1.029 03/14/2024 1725   PHURINE 6.5 03/14/2024 1725   GLUCOSEU >1,000 (A) 03/14/2024 1725   HGBUR NEGATIVE 03/14/2024 1725   BILIRUBINUR NEGATIVE 03/14/2024 1725   KETONESUR NEGATIVE 03/14/2024 1725   PROTEINUR NEGATIVE 03/14/2024 1725   UROBILINOGEN 0.2 12/09/2013 1556   NITRITE NEGATIVE 03/14/2024 1725   LEUKOCYTESUR NEGATIVE 03/14/2024 1725    Radiological Exams on Admission: I have personally reviewed images No results found.    Assessment/Plan: Principal Problem:   Hyperglycemia Active Problems:   Essential hypertension   History of renal transplant   History of intracranial hemorrhage   Generalized anxiety disorder   AKI (acute kidney injury) (HCC)    Assessment and Plan: Hyperglycemia History of recurrent hyperglycemia-secondary to chronic steroid use Patient does not have any history of DM type II. -Presenting to emergency department complaining of polyuria, polydipsia and feeling lethargic for last 2 to 3 days.  Presentation to ED patient is hemodynamically stable. - BMP showed elevated blood glucose around 682 and low sodium 129.  Elevated creatinine 1.01, low bicarb 17 normal anion gap.  POC blood glucose above 600.  Slightly elevated beta-hydroxybutyrate level.  VBG showing low bicarb 18 otherwise unremarkable.  No evidence of metabolic acidosis.  In the ED patient was given total 2 L of IV fluid and no insulin  regimen at all.  Patient has been transferred overnight to [facility]. - Repeat blood glucose check at 2 AM showing blood glucose 318 which has been improved from upper 600 range.   Given blood glucose level already has been improved holding initiation of insulin  drip. - Giving NovoLog  10 units, third liter of LR bolus and starting maintenance fluid LR 125 cc/h. - Obtaining BMP if BMP shows any evidence of worsening bicarb level and anion gap will initiate insulin  drip otherwise as of now plan to continue high sliding scale insulin  every 6 hour with  POC blood glucose check every 6 hour. -Per chart review patient has similar episodes of hyperglycemia in the past given she is on chronic steroid as immunosuppressant. - Starting metformin  500 mg twice daily to prevent future hyperglycemia.  And on discharge at home patient need to be instructed to check blood glucose 3 times daily with meal and need to follow-up with PCP for close monitoring of blood glucose level. -Pending A1c level.  Prerenal acute kidney injury -Elevated creatinine 1.01 which has been improved to 0.83 after receiving 2 L of LR bolus in the ED. - Patient had prerenal acute kidney injury in the setting of high osmolar load from hyperglycemia.  Monitor urine output avoid nephrotoxic agent.  Monitor renal function.   History of renal transplant -Continue prednisone  5 mg daily, Prograf  7 mg in the morning and 6 mg in the night.  Continue Myfortic  360 mg twice daily.  Essential hypertension -At home patient is not any blood pressure regimen.   Patient found to have elevated blood pressure as well - Starting amlodipine  5 mg - Continue hydralazine  as needed.  History of intracranial hemorrhage and TBI   DVT prophylaxis:  SCDs will avoid pharmacological DVT prophylaxis given history of traumatic brain injury and intracranial hemorrhage. Code Status:  Full  Code Diet: Heart healthy carb modified diet Disposition Plan: Need to follow-up with A1c level.  Continue sliding scale insulin  as needed coverage. Consults: Diabetic educator Admission status:   Observation, Telemetry bed  Severity of Illness: The appropriate patient status for this patient is OBSERVATION. Observation status is judged to be reasonable and necessary in order to provide the required intensity of service to ensure the patient's safety. The patient's presenting symptoms, physical exam findings, and initial radiographic and laboratory data in the context of their medical condition is felt to place them at  decreased risk for further clinical deterioration. Furthermore, it is anticipated that the patient will be medically stable for discharge from the hospital within 2 midnights of admission.     Saquoia Sianez, MD [facility]  How to contact the [facility] Attending or Consulting provider 7A - 7P or covering provider during after hours 7P -7A, for this patient.  Check the care team in [facility] and look for a) attending/consulting [facility] provider listed and b) the [facility] team listed Log into [email or web address] and use [facility] universal password to access. If you do not have the password, please contact the hospital operator. Locate the [facility] provider you are looking for under [facility] and page to a number that you can be directly reached. If you still have difficulty reaching the provider, please page the [facility] (Director on Call) for the Hospitalists listed on amion for assistance.  03/15/2024, 2:22 AM

## 2024-03-15 NOTE — Discharge Summary (Signed)
 [facility]  Physician Discharge Summary   Patient ID: Katie Lyons MRN: [id number] DOB/AGE: 1985-11-19 38 y.o.  Admit date: 03/14/2024 Discharge date: 03/15/2024    PCP: Vinson Blas, Marca Ip, MD  DISCHARGE DIAGNOSES:  New onset diabetes mellitus type 2, likely induced by steroids   Essential hypertension   History of renal transplant   History of intracranial hemorrhage   Generalized anxiety disorder   AKI (acute kidney injury) (HCC)   RECOMMENDATIONS FOR OUTPATIENT FOLLOW UP: Patient has to follow-up with PCP for further management of her diabetes. HbA1c is pending.   Home Health: None Equipment/Devices: None  CODE STATUS: Full code  DISCHARGE CONDITION: fair  Diet recommendation: Modified carbohydrate  INITIAL HISTORY: 38 year old female with history of ESRD status post renal transplant on chronic steroids, diastolic heart failure preserved EF, hypertension, iron  deficiency, secondary hyperparathyroidism, TBI, anxiety/depression.  She underwent excision and ligation of left arm cephalic vein AV fistula last month on 7/11.  She presents to the ED today for evaluation of fatigue/lethargy/somnolence since after her surgery.     At presentation to ED patient is afebrile and hemodynamically stable except blood pressure is borderline elevated.   Initial BMP showed low sodium 29, low bicarb 17, normal anion gap, elevated blood glucose 682 and elevated creatinine 1.01.  Repeat BMP showing serum sodium 132, bicarb 17 and blood glucose has been improved to 442 after giving 2 L of LR bolus in the ED. VBG showed low bicarb 18 and normal pH.  Elevated beta-hydroxybutyrate level 0.37. Most recent blood glucose is 440. CBC unremarkable.   In the ED patient received 2 L of IV fluid bolus. Patient did not receive any insulin  regimen in the ED. Patient does not have any history of DM type II. Patient has been transferred to [facility] for management  of hyperglycemia and AKI.    HOSPITAL COURSE:   Hyperglycemia History of recurrent hyperglycemia-secondary to chronic steroid use Patient was found to have elevated glucose levels.  No evidence for DKA.  Patient was given insulin  and then transitioned to metformin .  CBGs improved.  Patient started feeling better.  Wanted to go home.  Will be discharged on metformin .  Glucometer has been ordered.  She has a PCP and she will follow-up with them for further management.  HbA1c was ordered and results are still pending.  This can be followed up in the outpatient setting.  Patient was seen by diabetes coordinator.   Prerenal acute kidney injury -Elevated creatinine 1.01 which has been improved to 0.83 after receiving 2 L of LR bolus in the ED.   History of renal transplant -Continue prednisone  5 mg daily, Prograf  7 mg in the morning and 6 mg in the night.  Continue Myfortic  360 mg twice daily.   Essential hypertension Not on any blood pressure lowering agents at home.  Blood pressure is noted to be significantly elevated.  Started on amlodipine .  History of intracranial hemorrhage and TBI     Patient is stable.  Feels better.  Wants to go home.  Has ambulated in the room and hallway.  Okay for discharge.  PERTINENT LABS:  The results of significant diagnostics from this hospitalization (including imaging, microbiology, ancillary and laboratory) are listed below for reference.     Labs:   Basic Metabolic Panel: Recent Labs  Lab 03/14/24 1725 03/14/24 1932 03/14/24 2249 03/15/24 0208 03/15/24 0352  NA 129* 131* 132* 134* 137  K 3.9 4.0 4.1 3.8 3.3*  CL 97*  --  102 108 111  CO2 17*  --  17* 19* 17*  GLUCOSE 682*  --  442* 294* 78  BUN 18  --  15 12 11   CREATININE 1.01*  --  0.83 0.72 0.73  CALCIUM  9.7  --  9.2 9.2 9.4    CBC: Recent Labs  Lab 03/14/24 1725 03/14/24 1932 03/15/24 0352  WBC 9.4  --  13.7*  NEUTROABS 6.6  --   --   HGB 13.8 13.3 13.4  HCT 39.2 39.0  37.3  MCV 84.3  --  83.1  PLT 197  --  230    CBG: Recent Labs  Lab 03/14/24 2213 03/15/24 0142 03/15/24 0652 03/15/24 1231 03/15/24 1603  GLUCAP 440* 318* 102* 383* 182*     DISCHARGE EXAMINATION: Vitals:   03/14/24 2300 03/14/24 2353 03/15/24 0823 03/15/24 1605  BP: (!) 159/99 (!) 163/98 130/76 130/88  Pulse: 66 60 67 70  Resp:   (!) 25 16  Temp:  98.8 F (37.1 C) 98.3 F (36.8 C) 98.6 F (37 C)  TempSrc:  Oral Oral Oral  SpO2: 100% 100% 100% 100%  Weight:  60.6 kg    Height:       General appearance: Awake alert.  In no distress Resp: Clear to auscultation bilaterally.  Normal effort Cardio: S1-S2 is normal regular.  No S3-S4.  No rubs murmurs or bruit GI: Abdomen is soft.  Nontender nondistended.  Bowel sounds are present normal.  No masses organomegaly Extremities: No edema.  Full range of motion of lower extremities. Neurologic: Alert and oriented x3.  No focal neurological deficits.    DISPOSITION: Home  Discharge Instructions     Call MD for:  difficulty breathing, headache or visual disturbances   Complete by: As directed    Call MD for:  extreme fatigue   Complete by: As directed    Call MD for:  persistant dizziness or light-headedness   Complete by: As directed    Call MD for:  persistant nausea and vomiting   Complete by: As directed    Call MD for:  severe uncontrolled pain   Complete by: As directed    Call MD for:  temperature >100.4   Complete by: As directed    Diet Carb Modified   Complete by: As directed    Discharge instructions   Complete by: As directed    Please take your medications as prescribed.  Please be sure to follow-up with your primary care provider within 1 week after discharge.  You will need to monitor your glucose levels at home. Your HbA1c level is still pending.  Please have your primary care doctor follow-up on the results.  You were cared for by a hospitalist during your hospital stay. If you have any questions  about your discharge medications or the care you received while you were in the hospital after you are discharged, you can call the unit and asked to speak with the hospitalist on call if the hospitalist that took care of you is not available. Once you are discharged, your primary care physician will handle any further medical issues. Please note that NO REFILLS for any discharge medications will be authorized once you are discharged, as it is imperative that you return to your primary care physician (or establish a relationship with a primary care physician if you do not have one) for your aftercare needs so that they can reassess your need for medications and monitor your lab values. If you do  not have a primary care physician, you can call [phone] for a physician referral.   Increase activity slowly   Complete by: As directed          Allergies as of 03/15/2024   No Known Allergies      Medication List     TAKE these medications    Accu-Chek Guide Test test strip Generic drug: glucose blood selo segn las indicaciones para controlar el nivel de azcar en sangre 3 veces al da.   Accu-Chek Guide w/Device Kit selo segn las indicaciones para controlar el nivel de azcar en sangre 3 veces al da.   Accu-Chek Softclix Lancets lancets selo segn las indicaciones para controlar el nivel de azcar en sangre 3 veces al da.   acetaminophen  500 MG tablet Commonly known as: TYLENOL  Take 500-1,000 mg by mouth every 6 (six) hours as needed for mild pain (pain score 1-3) or headache.   amLODipine  5 MG tablet Commonly known as: NORVASC  Tome 1 tableta (5 mg en total) por va oral diariamente. (Take 1 tablet (5 mg total) by mouth daily.)   HYDROcodone -acetaminophen  5-325 MG tablet Commonly known as: NORCO/VICODIN Take 1 tablet by mouth every 6 (six) hours as needed for moderate pain (pain score 4-6).   Lancet Device Misc 1 each by Does not apply route 3 (three) times daily. May  dispense any manufacturer covered by patient's insurance.   metFORMIN  500 MG tablet Commonly known as: GLUCOPHAGE  Tome 1 tableta (500 mg en total) por va oral 2 (dos) veces al da con una comida. (Take 1 tablet (500 mg total) by mouth 2 (two) times daily with a meal.)   mycophenolate  360 MG Tbec EC tablet Commonly known as: MYFORTIC  Take 360 mg by mouth 2 (two) times daily.   predniSONE  5 MG tablet Commonly known as: DELTASONE  Take 5 mg by mouth daily with breakfast.   sodium bicarbonate  650 MG tablet Take 2,600 mg by mouth 3 (three) times daily. Four tablets three times a day   tacrolimus  1 MG capsule Commonly known as: PROGRAF  Take 6-7 mg by mouth See admin instructions. Take 7 tablets in the AM and 6 tablets in the PM.          Follow-up Information     Vinson Rilla Marca Hadassah, MD. Schedule an appointment as soon as possible for a visit in 1 week(s).   Specialty: Nephrology Why: post hospitalization follow up Contact information: [address] [place] [address] [phone]                 TOTAL DISCHARGE TIME: 35 minutes  Kieanna Rollo  [facility] Pager on [email or web address]  03/16/2024, 1:34 PM

## 2024-03-15 NOTE — ED Provider Notes (Signed)
 [facility] 4E CV SURGICAL PROGRESSIVE CARE Provider Note   CSN: [id number] Arrival date & time: 03/14/24  1716     Patient presents with: Fatigue   Katie Lyons is a 38 y.o. female.   Patient with history of ESRD status post renal transplant on chronic steroids and immunosuppression, CHF, hypertension presents today with complaints of generalized fatigue. Reports that on 7/11 she had surgery to remove her AV fistula. This was without complication. However, she reports since this time she has continued to feel like the anesthesia has not worn off yet. Reports she has no energy. Also reports that she is always thirsty and having frequent urination as well. Denies any history of this previously. She is not diabetic. Denies fevers, chills, chest pain, shortness of breath, nausea, vomiting, diarrhea, abdominal pain.   The history is provided by the patient. A language interpreter was used (Daughter at bedside interprets).       Prior to Admission medications   Medication Sig Start Date End Date Taking? Authorizing Provider  acetaminophen  (TYLENOL ) 500 MG tablet Take 500-1,000 mg by mouth every 6 (six) hours as needed for mild pain (pain score 1-3) or headache.    [provider]  HYDROcodone -acetaminophen  (NORCO/VICODIN) 5-325 MG tablet Take 1 tablet by mouth every 6 (six) hours as needed for moderate pain (pain score 4-6). 02/22/24   Bethanie Cough, PA-C  mycophenolate  (MYFORTIC ) 360 MG TBEC EC tablet Take 360 mg by mouth 2 (two) times daily.    [provider]  predniSONE  (DELTASONE ) 5 MG tablet Take 5 mg by mouth daily with breakfast.    [provider]  sodium bicarbonate  650 MG tablet Take 2,600 mg by mouth 3 (three) times daily. Four tablets three times a day 05/20/18   [provider]  tacrolimus  (PROGRAF ) 1 MG capsule Take 6-7 mg by mouth See admin instructions. Take 7 tablets in the AM and 6 tablets in the PM. 10/07/18   [provider]    Allergies: Patient has no known allergies.    Review of Systems  Constitutional:  Positive for fatigue.  Endocrine: Positive for polydipsia and polyuria.  All other systems reviewed and are negative.   Updated Vital Signs BP (!) 163/98 (BP Location: Left Arm)   Pulse 60   Temp 98.8 F (37.1 C) (Oral)   Resp 14   Ht 5' (1.524 m)   Wt 60.6 kg   LMP  (LMP Unknown) Comment: pt said she is on period a little bit right now, and it started around May  SpO2 100%   BMI 26.07 kg/m   Physical Exam Vitals and nursing note reviewed.  Constitutional:      General: She is not in acute distress.    Appearance: Normal appearance. She is normal weight. She is not ill-appearing, toxic-appearing or diaphoretic.  HENT:     Head: Normocephalic and atraumatic.  Eyes:     Extraocular Movements: Extraocular movements intact.     Pupils: Pupils are equal, round, and reactive to light.  Cardiovascular:     Rate and Rhythm: Normal rate and regular rhythm.     Heart sounds: Normal heart sounds.  Pulmonary:     Effort: Pulmonary effort is normal. No respiratory distress.     Breath sounds: Normal breath sounds.  Abdominal:     General: Abdomen is flat.     Palpations: Abdomen is soft.     Tenderness: There is no abdominal tenderness.  Musculoskeletal:  General: Normal range of motion.     Cervical back: Normal range of motion.     Right lower leg: No edema.     Left lower leg: No edema.  Skin:    General: Skin is warm and dry.  Neurological:     General: No focal deficit present.     Mental Status: She is alert.  Psychiatric:        Mood and Affect: Mood normal.        Behavior: Behavior normal.     (all labs ordered are listed, but only abnormal results are displayed) Labs Reviewed  BASIC METABOLIC PANEL WITH GFR - Abnormal; Notable for the following components:      Result Value   Sodium 129 (*)    Chloride 97 (*)    CO2 17 (*)    Glucose, Bld 682 (*)     Creatinine, Ser 1.01 (*)    All other components within normal limits  URINALYSIS, ROUTINE W REFLEX MICROSCOPIC - Abnormal; Notable for the following components:   Color, Urine COLORLESS (*)    Glucose, UA >1,000 (*)    Bacteria, UA RARE (*)    All other components within normal limits  BETA-HYDROXYBUTYRIC ACID - Abnormal; Notable for the following components:   Beta-Hydroxybutyric Acid 0.37 (*)    All other components within normal limits  BASIC METABOLIC PANEL WITH GFR - Abnormal; Notable for the following components:   Sodium 132 (*)    CO2 17 (*)    Glucose, Bld 442 (*)    All other components within normal limits  CBG MONITORING, ED - Abnormal; Notable for the following components:   Glucose-Capillary >600 (*)    All other components within normal limits  I-STAT VENOUS BLOOD GAS, ED - Abnormal; Notable for the following components:   pCO2, Ven 31.7 (*)    pO2, Ven 64 (*)    Bicarbonate 18.9 (*)    TCO2 20 (*)    Acid-base deficit 5.0 (*)    Sodium 131 (*)    All other components within normal limits  CBG MONITORING, ED - Abnormal; Notable for the following components:   Glucose-Capillary 568 (*)    All other components within normal limits  CBG MONITORING, ED - Abnormal; Notable for the following components:   Glucose-Capillary 440 (*)    All other components within normal limits  CBC WITH DIFFERENTIAL/PLATELET  HEMOGLOBIN A1C  HIV ANTIBODY (ROUTINE TESTING W REFLEX)  BASIC METABOLIC PANEL WITH GFR  BASIC METABOLIC PANEL WITH GFR  BASIC METABOLIC PANEL WITH GFR  BASIC METABOLIC PANEL WITH GFR  BASIC METABOLIC PANEL WITH GFR  OSMOLALITY, URINE  OSMOLALITY  URINALYSIS, ROUTINE W REFLEX MICROSCOPIC  CBC    EKG: None  Radiology: No results found.   Procedures   Medications Ordered in the ED  lactated ringers  bolus 1,212 mL (has no administration in time range)  insulin  regular, human (MYXREDLIN) 100 units/ 100 mL infusion (has no administration in time range)   lactated ringers  infusion (has no administration in time range)  dextrose  5 % in lactated ringers  infusion (has no administration in time range)  dextrose  50 % solution 0-50 mL (has no administration in time range)  potassium chloride  10 mEq in 100 mL IVPB (has no administration in time range)  sodium chloride  0.9 % bolus 1,000 mL (0 mLs Intravenous Stopped 03/14/24 2056)  lactated ringers  bolus 1,000 mL (0 mLs Intravenous Stopped 03/14/24 2203)  Medical Decision Making Amount and/or Complexity of Data Reviewed Labs: ordered.  Risk Decision regarding hospitalization.   This patient is a 38 y.o. female who presents to the ED for concern of fatigue, this involves an extensive number of treatment options, and is a complaint that carries with it a high risk of complications and morbidity. The emergent differential diagnosis prior to evaluation includes, but is not limited to, DKA/HHS, CVA, ACS, arrhythmia, syncope, orthostatic hypotension, sepsis, hypoglycemia, hypoxia, electrolyte disturbance, endocrine disorder, anemia, environmental exposure, polypharmacy  This is not an exhaustive differential.   Past Medical History / Co-morbidities / Social History:  has a past medical history of Anxiety, CHF (congestive heart failure) (HCC) (11/14/2006), Depression, End stage renal disease (HCC), Headache, Hypertension, Iron  deficiency, Secondary hyperparathyroidism (HCC), and Traumatic brain injury (HCC).  Patient is Spanish-speaking only requiring her daughter at bedside for interpreter  Additional history: Chart reviewed. Pertinent results include: had AV fistula excision and ligation with vascular surgery Dr. Sheree on 7/11  Physical Exam: Physical exam performed. The pertinent findings include: well appearing, no acute physical exam abnormalities  Lab Tests: I ordered, and personally interpreted labs.  The pertinent results include:  No leukocytosis or anemia,  Na 129, chloride 97, bicarb 17, glucose 682 --> 568 --> 440, anion gap 15, Bhb 0.37   Medications: I ordered medication including IV fluids  for hyperglycemia. Reevaluation of the patient after these medicines showed that the patient improved. I have reviewed the patients home medicines and have made adjustments as needed.   Disposition: After consideration of the diagnostic results and the patients response to treatment, I feel that patient will require admission for new hyperglycemia. Given her medical complexity, feel she will need inpatient evaluation and management and will likely need to initiate insulin .  Discussed plan with patient is understanding and in agreement with this.  Discussed patient with hospitalist Dr. Alfornia accepts patient for admission  I discussed this case with my attending physician Dr. Doretha who cosigned this note including patient's presenting symptoms, physical exam, and planned diagnostics and interventions. Attending physician stated agreement with plan or made changes to plan which were implemented.    Final diagnoses:  Hyperglycemia    ED Discharge Orders     None          Nora Lauraine DELENA DEVONNA 03/15/24 9941    Doretha Folks, MD 03/17/24 [phone]

## 2024-03-15 NOTE — Discharge Summary (Incomplete)
 Gave patient discharge instructions, living well with diabetes book-in spanish, and delivered recommendations per RD. Instructions given in Spanish by daughter who was present per patient preference.

## 2024-03-15 NOTE — Inpatient Diabetes Management (Signed)
 Inpatient Diabetes Program Recommendations  AACE/ADA: New Consensus Statement on Inpatient Glycemic Control (2015)  Target Ranges:  Prepandial:   less than 140 mg/dL      Peak postprandial:   less than 180 mg/dL (1-2 hours)      Critically ill patients:  140 - 180 mg/dL   Lab Results  Component Value Date   GLUCAP 383 (H) 03/15/2024   HGBA1C 5.8 (H) 12/30/2022    Review of Glycemic Control  Diabetes history: New-onset DM Outpatient Diabetes medications: None Current orders for Inpatient glycemic control: metformin  500 mg BID, Novolog  0-15 Q6H, prednisone  5 mg daily  HgbA1C - pending CBGs today: 318, 102, 383 To be d/ced home on metformin  500 mg BID  Inpatient Diabetes Program Recommendations:    Recommend monitoring blood sugars at least 3 times/day.  Goal is 100-180 mg/dL If blood sugars > 749 x 2, call PCP for additional orders. Hypoglycemia is blood sugars < 70 mg/dL. Treat with 1/2 c juice or 1/2 c regular soda. Recheck in 15 minutes and if normal, eat meal. If blood sugar still < 70, repeat this process. See Living Well book for more info.  Ask for Spanish interpreter phone number on glucose meter instructions.  Schedule appt with PCP for new onset DM.  RN please use interpreter to review these recommendations to pt.  Thank you. Shona Brandy, RD, LDN, CDCES Inpatient Diabetes Coordinator [phone]

## 2024-03-17 LAB — HEMOGLOBIN A1C
Hgb A1c MFr Bld: 10.5 % — ABNORMAL HIGH (ref 4.8–5.6)
Mean Plasma Glucose: 255 mg/dL

## 2024-03-19 ENCOUNTER — Ambulatory Visit: Attending: Vascular Surgery | Admitting: Physician Assistant

## 2024-03-19 VITALS — BP 114/81 | HR 69 | Temp 97.7°F | Wt 131.7 lb

## 2024-03-19 DIAGNOSIS — R202 Paresthesia of skin: Secondary | ICD-10-CM

## 2024-03-19 NOTE — Progress Notes (Signed)
    Postoperative Access Visit   History of Present Illness   Katie Lyons is a 38 y.o. year old female who presents for postoperative follow-up for: Excision and ligation of left arm cephalic vein AV fistula by Dr. Sheree on 02/22/24. She has history of end-stage renal disease previously dialyzed via left upper arm AV fistula and more recently has renal transplant for 5 years, which is functioning well. She has evidence of steal in her left upper extremity so was indicated for ligation of the fistula and excision. She presents today with her daughter who assists with translating. The patient's wounds healing very well.  The patient notes no steal symptoms.    Physical Examination   Vitals:   03/19/24 1452  BP: 114/81  Pulse: 69  Temp: 97.7 F (36.5 C)  TempSrc: Temporal  Weight: 131 lb 11.2 oz (59.7 kg)   Body mass index is 25.72 kg/m.  left arm Incision is well healed, 2+ radial pulse, hand grip is 5/5, sensation in digits is intact, palpable thrill, bruit can  be auscultated     Medical Decision Making   Katie Lyons is a 38 y.o. year old female who presents for postoperative follow-up for: Excision and ligation of left arm cephalic vein AV fistula by Dr. Sheree on 02/22/24. Her incisions are healing very well. She is without signs or symptoms of steal syndrome. The patient may follow up on a prn basis   Teretha Damme, PA-C [facility] Office: [phone]  Clinic MD: Sheree

## 2024-08-15 ENCOUNTER — Emergency Department (HOSPITAL_COMMUNITY): Admission: EM | Admit: 2024-08-15 | Discharge: 2024-08-15 | Disposition: A | Discharge status: Home / Self Care

## 2024-08-15 DIAGNOSIS — I509 Heart failure, unspecified: Secondary | ICD-10-CM | POA: Diagnosis not present

## 2024-08-15 DIAGNOSIS — Z79899 Other long term (current) drug therapy: Secondary | ICD-10-CM | POA: Insufficient documentation

## 2024-08-15 DIAGNOSIS — J101 Influenza due to other identified influenza virus with other respiratory manifestations: Secondary | ICD-10-CM | POA: Diagnosis not present

## 2024-08-15 DIAGNOSIS — I132 Hypertensive heart and chronic kidney disease with heart failure and with stage 5 chronic kidney disease, or end stage renal disease: Secondary | ICD-10-CM | POA: Insufficient documentation

## 2024-08-15 DIAGNOSIS — N186 End stage renal disease: Secondary | ICD-10-CM | POA: Insufficient documentation

## 2024-08-15 DIAGNOSIS — R059 Cough, unspecified: Secondary | ICD-10-CM | POA: Diagnosis present

## 2024-08-15 LAB — RESP PANEL BY RT-PCR (RSV, FLU A&B, COVID)  RVPGX2
Influenza A by PCR: POSITIVE — AB
Influenza B by PCR: NEGATIVE
Resp Syncytial Virus by PCR: NEGATIVE
SARS Coronavirus 2 by RT PCR: NEGATIVE

## 2024-08-15 MED ORDER — BENZONATATE 100 MG PO CAPS: 100.0000 mg | ORAL_CAPSULE | Freq: Three times a day (TID) | ORAL | 0 refills | Status: AC

## 2024-08-15 MED ORDER — OSELTAMIVIR PHOSPHATE 75 MG PO CAPS: 75.0000 mg | ORAL_CAPSULE | Freq: Two times a day (BID) | ORAL | 0 refills | Status: AC

## 2024-08-15 MED ORDER — ACETAMINOPHEN 500 MG PO TABS: 500.0000 mg | ORAL_TABLET | Freq: Four times a day (QID) | ORAL | 0 refills | Status: AC | PRN

## 2024-08-15 NOTE — ED Provider Notes (Signed)
 " [facility] AT [facility] Provider Note   CSN: [id number] Arrival date & time: 08/15/24  1124     Patient presents with: Cough   Katie Lyons is a 39 y.o. female.   The history is provided by the patient, a relative and medical records. The history is limited by a language barrier. A language interpreter was used.  Cough    39 year old Hispanic speaking female with history of hypertension, CHF, end-stage renal disease presenting with complaint of flulike symptoms.  History obtained through daughter who serves as her language interpreter per her request.  Since yesterday patient has had fever, chills, body aches, headache, congestion, sore throat, cough, and generalized fatigue.  She was recently exposed to family with sick contact.  She tried taking Tylenol  at home without adequate relief.  She does not endorse any shortness of breath no nausea vomiting or diarrhea.  Prior to Admission medications  Medication Sig Start Date End Date Taking? Authorizing Provider  acetaminophen  (TYLENOL ) 500 MG tablet Take 500-1,000 mg by mouth every 6 (six) hours as needed for mild pain (pain score 1-3) or headache.    [provider]  amLODipine  (NORVASC ) 5 MG tablet Take 1 tablet (5 mg total) by mouth daily. 03/16/24   Krishnan, Gokul, MD  Blood Glucose Monitoring Suppl (BLOOD GLUCOSE MONITOR SYSTEM) w/Device KIT selo segn las indicaciones para controlar el nivel de azcar en sangre 3 veces al da. 03/15/24   Verdene Purchase, MD  Glucose Blood (BLOOD GLUCOSE TEST STRIPS) STRP selo segn las indicaciones para controlar el nivel de azcar en sangre 3 veces al da. 03/15/24   Krishnan, Gokul, MD  HYDROcodone -acetaminophen  (NORCO/VICODIN) 5-325 MG tablet Take 1 tablet by mouth every 6 (six) hours as needed for moderate pain (pain score 4-6). 02/22/24   Bethanie Cough, PA-C  Lancet Device MISC 1 each by Does not apply route 3 (three) times daily. May dispense  any manufacturer covered by patient's insurance. 03/15/24   Verdene Purchase, MD  Lancets MISC selo segn las indicaciones para controlar el nivel de azcar en sangre 3 veces al da. 03/15/24   Krishnan, Gokul, MD  metFORMIN  (GLUCOPHAGE ) 500 MG tablet Take 1 tablet (500 mg total) by mouth 2 (two) times daily with a meal. 03/15/24   Verdene Purchase, MD  mycophenolate  (MYFORTIC ) 360 MG TBEC EC tablet Take 360 mg by mouth 2 (two) times daily.    [provider]  predniSONE  (DELTASONE ) 5 MG tablet Take 5 mg by mouth daily with breakfast.    [provider]  sodium bicarbonate  650 MG tablet Take 2,600 mg by mouth 3 (three) times daily. Four tablets three times a day 05/20/18   [provider]  tacrolimus  (PROGRAF ) 1 MG capsule Take 6-7 mg by mouth See admin instructions. Take 7 tablets in the AM and 6 tablets in the PM. 10/07/18   [provider]    Allergies: Patient has no known allergies.    Review of Systems  Respiratory:  Positive for cough.   All other systems reviewed and are negative.   Updated Vital Signs BP (!) 132/90 (BP Location: Left Arm)   Pulse 76   Temp 98.3 F (36.8 C) (Oral)   Resp 18   SpO2 94%   Physical Exam Vitals and nursing note reviewed.  Constitutional:      General: She is not in acute distress.    Appearance: She is well-developed.     Comments: Patient is sitting in the  chair, wearing a mask, appears uncomfortable.  HENT:     Head: Atraumatic.     Mouth/Throat:     Mouth: Mucous membranes are moist.  Eyes:     Conjunctiva/sclera: Conjunctivae normal.  Cardiovascular:     Rate and Rhythm: Normal rate and regular rhythm.     Pulses: Normal pulses.     Heart sounds: Normal heart sounds.  Pulmonary:     Effort: Pulmonary effort is normal.     Breath sounds: No wheezing, rhonchi or rales.  Abdominal:     Palpations: Abdomen is soft.     Tenderness: There is no abdominal tenderness.  Musculoskeletal:     Cervical back:  Normal range of motion and neck supple. No rigidity.  Skin:    Findings: No rash.  Neurological:     Mental Status: She is alert.  Psychiatric:        Mood and Affect: Mood normal.     (all labs ordered are listed, but only abnormal results are displayed) Labs Reviewed  RESP PANEL BY RT-PCR (RSV, FLU A&B, COVID)  [id number] - Abnormal; Notable for the following components:      Result Value   Influenza A by PCR POSITIVE (*)    All other components within normal limits    EKG: None  Radiology: No results found.   Procedures   Medications Ordered in the ED - No data to display                                  Medical Decision Making  BP (!) 132/90 (BP Location: Left Arm)   Pulse 76   Temp 98.3 F (36.8 C) (Oral)   Resp 18   SpO2 94%   71:45 PM  39 year old Hispanic speaking female with history of hypertension, CHF, end-stage renal disease presenting with complaint of flulike symptoms.  History obtained through daughter who serves as her language interpreter per her request.  Since yesterday patient has had fever, chills, body aches, headache, congestion, sore throat, cough, and generalized fatigue.  She was recently exposed to family with sick contact.  She tried taking Tylenol  at home without adequate relief.  She does not endorse any shortness of breath no nausea vomiting or diarrhea.  Exam overall reassuring, lungs are clear and patient is nontoxic in appearance  39 y.o. female presenting with flu like sxs.  Obtained influenza A/B screen, which revealed positive influenza A.  At this time, patient's presentation most consistent with influenza.  The following were considered in the patient's differential diagnosis but was not deemed to be consistent with patient's history of present illness and/or physical examination; meningitis, pharyngitis, otitis media, pneumonia, urinary tract infection, peritonsillar abscess, retropharyngeal abscess.  As patient does not present w/ any  signs/symptoms of pneumonia or other complications, deferred CXR or further labwork at this time. Educated patient on diagnosis and natural course of influenza.  Supportive care and preventive measures were discussed.  Continue fluid hydration. Follow up with primary physician in 3-5 days if symptoms continue or new problems arise. Return to ED if high fever, altered mental status, shortness of breath, uncontrolled vomiting, or other concers.  Impression: Influenza   Plan:    * Discharge from ED   * As patient is within 48hr window of fever onset, prescribed oseltamivir  75mg  bid x5d, and instructed Pt to complete entire course.  Patient previous renal function is within normal limit.   *  Advised Pt on support therapies, including rest, advancement of fluids as tolerated, thorough handwashing w/ soap and H2O, taking OTC ibuprofen  or acetaminophen  as directed prn for F/C and myalgias, OTC expectorant/antitussive /decongestants as directed prn, and refraining from taking ASA.   * Advised Pt to refrain from visiting work, school, or daycares or visiting pregnant women, elderly, or those w/ chronic illnesses.   * Advised Pt to obtain annual influenza vaccine upon resolution of CC.   * Advised Pt to monitor for dyspnea, respiratory distress, worsening F/C, and signs of dehydration (xerostomia, polydipsia, oliguria, weakness, and constitutional Sx). Instructed Pt to f/up w/ PCP or ETC should Sx worsen or not improve. Pt verbally expressed understanding and all questions were addressed to Pt's satisfaction.      Final diagnoses:  Influenza A    ED Discharge Orders          Ordered    oseltamivir  (TAMIFLU ) 75 MG capsule  Every 12 hours        08/15/24 1331    benzonatate  (TESSALON ) 100 MG capsule  Every 8 hours        08/15/24 1331    acetaminophen  (TYLENOL ) 500 MG tablet  Every 6 hours PRN        08/15/24 1331               Nivia Colon, PA-C 08/15/24 1333    Neysa Caron PARAS,  DO 08/15/24 1726  "

## 2024-08-15 NOTE — ED Triage Notes (Signed)
 Cough and body aches starting yesterday. Has been taking tylenol .

## 2024-08-15 NOTE — Discharge Instructions (Addendum)
 You have been tested positive for influenza A.  Take medication prescribed.  Stay hydrated, get plenty of rest, I hope you feel better soon.
